# Patient Record
Sex: Female | Born: 1935
Health system: Southern US, Community
[De-identification: ages and names within clinical notes are randomized; demographics above are authoritative.]

## PROBLEM LIST (undated history)

## (undated) DIAGNOSIS — F411 Generalized anxiety disorder: Secondary | ICD-10-CM

## (undated) DIAGNOSIS — Z973 Presence of spectacles and contact lenses: Secondary | ICD-10-CM

## (undated) DIAGNOSIS — H269 Unspecified cataract: Secondary | ICD-10-CM

## (undated) DIAGNOSIS — I5189 Other ill-defined heart diseases: Secondary | ICD-10-CM

## (undated) DIAGNOSIS — I471 Supraventricular tachycardia, unspecified: Secondary | ICD-10-CM

## (undated) DIAGNOSIS — I519 Heart disease, unspecified: Secondary | ICD-10-CM

## (undated) DIAGNOSIS — K219 Gastro-esophageal reflux disease without esophagitis: Secondary | ICD-10-CM

## (undated) DIAGNOSIS — I080 Rheumatic disorders of both mitral and aortic valves: Secondary | ICD-10-CM

## (undated) DIAGNOSIS — M1990 Unspecified osteoarthritis, unspecified site: Secondary | ICD-10-CM

## (undated) DIAGNOSIS — I442 Atrioventricular block, complete: Secondary | ICD-10-CM

## (undated) DIAGNOSIS — K922 Gastrointestinal hemorrhage, unspecified: Secondary | ICD-10-CM

## (undated) DIAGNOSIS — I447 Left bundle-branch block, unspecified: Secondary | ICD-10-CM

## (undated) DIAGNOSIS — C801 Malignant (primary) neoplasm, unspecified: Secondary | ICD-10-CM

## (undated) DIAGNOSIS — I480 Paroxysmal atrial fibrillation: Secondary | ICD-10-CM

## (undated) DIAGNOSIS — Z8711 Personal history of peptic ulcer disease: Secondary | ICD-10-CM

## (undated) HISTORY — DX: Supraventricular tachycardia, unspecified: I47.10

## (undated) HISTORY — PX: CATARACT EXTRACTION: SUR2

## (undated) HISTORY — DX: Generalized anxiety disorder: F41.1

## (undated) HISTORY — PX: ESOPHAGOGASTRODUODENOSCOPY: SHX1529

## (undated) HISTORY — DX: Gastro-esophageal reflux disease without esophagitis: K21.9

## (undated) HISTORY — PX: TUBAL LIGATION: SHX77

## (undated) HISTORY — DX: Other ill-defined heart diseases: I51.89

## (undated) HISTORY — PX: BREAST SURGERY: SHX581

## (undated) HISTORY — PX: TONSILLECTOMY: SUR1361

## (undated) HISTORY — DX: Heart disease, unspecified: I51.9

## (undated) HISTORY — DX: Personal history of peptic ulcer disease: Z87.11

## (undated) HISTORY — PX: ABDOMINAL HYSTERECTOMY: SHX81

## (undated) HISTORY — PX: APPENDECTOMY: SHX54

## (undated) HISTORY — DX: Gastrointestinal hemorrhage, unspecified: K92.2

## (undated) HISTORY — PX: FOOT SURGERY: SHX648

## (undated) HISTORY — DX: Rheumatic disorders of both mitral and aortic valves: I08.0

## (undated) HISTORY — DX: Supraventricular tachycardia: I47.1

---

## 1998-11-09 ENCOUNTER — Inpatient Hospital Stay (HOSPITAL_COMMUNITY): Admission: EM | Admit: 1998-11-09 | Discharge: 1998-11-10 | Payer: Self-pay | Admitting: Emergency Medicine

## 2000-07-05 ENCOUNTER — Encounter: Admission: RE | Admit: 2000-07-05 | Discharge: 2000-07-05 | Payer: Self-pay | Admitting: Geriatric Medicine

## 2000-07-05 ENCOUNTER — Encounter: Payer: Self-pay | Admitting: Geriatric Medicine

## 2000-07-28 ENCOUNTER — Encounter: Payer: Self-pay | Admitting: Geriatric Medicine

## 2000-07-28 ENCOUNTER — Encounter: Admission: RE | Admit: 2000-07-28 | Discharge: 2000-07-28 | Payer: Self-pay | Admitting: Geriatric Medicine

## 2000-07-30 ENCOUNTER — Ambulatory Visit (HOSPITAL_COMMUNITY): Admission: RE | Admit: 2000-07-30 | Discharge: 2000-07-30 | Payer: Self-pay | Admitting: Geriatric Medicine

## 2001-08-10 ENCOUNTER — Encounter: Payer: Self-pay | Admitting: Geriatric Medicine

## 2001-08-10 ENCOUNTER — Encounter: Admission: RE | Admit: 2001-08-10 | Discharge: 2001-08-10 | Payer: Self-pay | Admitting: Geriatric Medicine

## 2001-12-25 ENCOUNTER — Other Ambulatory Visit: Admission: RE | Admit: 2001-12-25 | Discharge: 2001-12-25 | Payer: Self-pay | Admitting: Geriatric Medicine

## 2002-08-23 ENCOUNTER — Encounter: Payer: Self-pay | Admitting: Geriatric Medicine

## 2002-08-23 ENCOUNTER — Encounter: Admission: RE | Admit: 2002-08-23 | Discharge: 2002-08-23 | Payer: Self-pay | Admitting: Geriatric Medicine

## 2002-08-24 ENCOUNTER — Encounter: Payer: Self-pay | Admitting: Geriatric Medicine

## 2002-08-24 ENCOUNTER — Encounter: Admission: RE | Admit: 2002-08-24 | Discharge: 2002-08-24 | Payer: Self-pay | Admitting: Geriatric Medicine

## 2003-09-05 ENCOUNTER — Encounter: Admission: RE | Admit: 2003-09-05 | Discharge: 2003-09-05 | Payer: Self-pay | Admitting: Geriatric Medicine

## 2003-10-14 ENCOUNTER — Encounter: Admission: RE | Admit: 2003-10-14 | Discharge: 2003-10-14 | Payer: Self-pay | Admitting: Geriatric Medicine

## 2004-09-18 ENCOUNTER — Encounter: Admission: RE | Admit: 2004-09-18 | Discharge: 2004-09-18 | Payer: Self-pay | Admitting: Geriatric Medicine

## 2005-01-29 ENCOUNTER — Ambulatory Visit (HOSPITAL_COMMUNITY): Admission: RE | Admit: 2005-01-29 | Discharge: 2005-01-29 | Payer: Self-pay | Admitting: Gastroenterology

## 2005-01-29 ENCOUNTER — Encounter (INDEPENDENT_AMBULATORY_CARE_PROVIDER_SITE_OTHER): Payer: Self-pay | Admitting: *Deleted

## 2005-02-04 ENCOUNTER — Ambulatory Visit: Payer: Self-pay | Admitting: Cardiology

## 2005-02-12 ENCOUNTER — Ambulatory Visit: Payer: Self-pay | Admitting: Cardiology

## 2005-02-19 ENCOUNTER — Ambulatory Visit: Payer: Self-pay | Admitting: Cardiology

## 2005-05-31 ENCOUNTER — Emergency Department (HOSPITAL_COMMUNITY): Admission: EM | Admit: 2005-05-31 | Discharge: 2005-05-31 | Payer: Self-pay | Admitting: Emergency Medicine

## 2005-06-03 ENCOUNTER — Ambulatory Visit: Payer: Self-pay | Admitting: Cardiology

## 2005-06-19 ENCOUNTER — Ambulatory Visit: Payer: Self-pay | Admitting: Cardiology

## 2005-07-02 ENCOUNTER — Ambulatory Visit: Payer: Self-pay

## 2005-10-14 ENCOUNTER — Encounter: Admission: RE | Admit: 2005-10-14 | Discharge: 2005-10-14 | Payer: Self-pay | Admitting: Geriatric Medicine

## 2005-10-25 ENCOUNTER — Ambulatory Visit: Payer: Self-pay | Admitting: Cardiology

## 2006-10-17 ENCOUNTER — Encounter: Admission: RE | Admit: 2006-10-17 | Discharge: 2006-10-17 | Payer: Self-pay | Admitting: Geriatric Medicine

## 2006-10-24 ENCOUNTER — Ambulatory Visit: Payer: Self-pay | Admitting: Cardiology

## 2007-04-28 ENCOUNTER — Encounter (INDEPENDENT_AMBULATORY_CARE_PROVIDER_SITE_OTHER): Payer: Self-pay | Admitting: Orthopedic Surgery

## 2007-04-28 ENCOUNTER — Ambulatory Visit: Payer: Self-pay | Admitting: Vascular Surgery

## 2007-04-28 ENCOUNTER — Ambulatory Visit: Admission: RE | Admit: 2007-04-28 | Discharge: 2007-04-28 | Payer: Self-pay | Admitting: Orthopedic Surgery

## 2007-10-23 ENCOUNTER — Encounter: Admission: RE | Admit: 2007-10-23 | Discharge: 2007-10-23 | Payer: Self-pay | Admitting: Geriatric Medicine

## 2007-10-29 HISTORY — PX: KNEE ARTHROSCOPY: SUR90

## 2008-01-22 ENCOUNTER — Ambulatory Visit: Payer: Self-pay | Admitting: Cardiology

## 2008-05-08 ENCOUNTER — Encounter: Admission: RE | Admit: 2008-05-08 | Discharge: 2008-05-08 | Payer: Self-pay | Admitting: Geriatric Medicine

## 2008-05-24 ENCOUNTER — Encounter: Admission: RE | Admit: 2008-05-24 | Discharge: 2008-07-20 | Payer: Self-pay | Admitting: Geriatric Medicine

## 2008-10-24 ENCOUNTER — Encounter: Admission: RE | Admit: 2008-10-24 | Discharge: 2008-10-24 | Payer: Self-pay | Admitting: Geriatric Medicine

## 2009-02-28 ENCOUNTER — Telehealth: Payer: Self-pay | Admitting: Cardiology

## 2009-03-13 ENCOUNTER — Encounter: Payer: Self-pay | Admitting: Cardiology

## 2009-03-26 DIAGNOSIS — I471 Supraventricular tachycardia, unspecified: Secondary | ICD-10-CM | POA: Insufficient documentation

## 2009-03-28 ENCOUNTER — Ambulatory Visit: Payer: Self-pay | Admitting: Cardiology

## 2009-04-05 ENCOUNTER — Telehealth: Payer: Self-pay | Admitting: Cardiology

## 2009-04-13 DIAGNOSIS — K219 Gastro-esophageal reflux disease without esophagitis: Secondary | ICD-10-CM

## 2009-04-13 DIAGNOSIS — I080 Rheumatic disorders of both mitral and aortic valves: Secondary | ICD-10-CM | POA: Insufficient documentation

## 2009-04-13 DIAGNOSIS — Z8711 Personal history of peptic ulcer disease: Secondary | ICD-10-CM

## 2009-04-13 DIAGNOSIS — R002 Palpitations: Secondary | ICD-10-CM

## 2009-04-13 DIAGNOSIS — I447 Left bundle-branch block, unspecified: Secondary | ICD-10-CM | POA: Insufficient documentation

## 2009-04-19 ENCOUNTER — Ambulatory Visit: Payer: Self-pay | Admitting: Cardiology

## 2009-05-02 ENCOUNTER — Encounter: Payer: Self-pay | Admitting: Cardiology

## 2009-05-02 ENCOUNTER — Ambulatory Visit: Payer: Self-pay

## 2009-05-11 ENCOUNTER — Telehealth: Payer: Self-pay | Admitting: Cardiology

## 2009-10-21 ENCOUNTER — Encounter: Payer: Self-pay | Admitting: Cardiology

## 2009-10-21 ENCOUNTER — Ambulatory Visit: Payer: Self-pay | Admitting: Internal Medicine

## 2009-10-21 ENCOUNTER — Inpatient Hospital Stay (HOSPITAL_COMMUNITY): Admission: EM | Admit: 2009-10-21 | Discharge: 2009-10-22 | Payer: Self-pay | Admitting: Emergency Medicine

## 2009-10-22 ENCOUNTER — Encounter: Payer: Self-pay | Admitting: Cardiology

## 2009-10-24 ENCOUNTER — Telehealth: Payer: Self-pay | Admitting: Cardiology

## 2009-10-30 ENCOUNTER — Telehealth (INDEPENDENT_AMBULATORY_CARE_PROVIDER_SITE_OTHER): Payer: Self-pay | Admitting: *Deleted

## 2009-10-31 ENCOUNTER — Ambulatory Visit: Payer: Self-pay | Admitting: Cardiovascular Disease

## 2009-10-31 ENCOUNTER — Encounter (HOSPITAL_COMMUNITY): Admission: RE | Admit: 2009-10-31 | Discharge: 2010-01-01 | Payer: Self-pay | Admitting: Cardiology

## 2009-10-31 ENCOUNTER — Ambulatory Visit: Payer: Self-pay

## 2009-11-17 ENCOUNTER — Telehealth: Payer: Self-pay | Admitting: Cardiology

## 2009-11-20 ENCOUNTER — Encounter: Payer: Self-pay | Admitting: Cardiology

## 2009-11-22 ENCOUNTER — Ambulatory Visit: Payer: Self-pay | Admitting: Cardiology

## 2009-12-19 ENCOUNTER — Encounter: Admission: RE | Admit: 2009-12-19 | Discharge: 2009-12-19 | Payer: Self-pay | Admitting: Geriatric Medicine

## 2010-01-15 ENCOUNTER — Ambulatory Visit: Payer: Self-pay | Admitting: Internal Medicine

## 2010-04-02 ENCOUNTER — Encounter: Payer: Self-pay | Admitting: Internal Medicine

## 2010-04-02 ENCOUNTER — Telehealth: Payer: Self-pay | Admitting: Internal Medicine

## 2010-04-02 ENCOUNTER — Emergency Department (HOSPITAL_COMMUNITY): Admission: EM | Admit: 2010-04-02 | Discharge: 2010-04-02 | Payer: Self-pay | Admitting: Emergency Medicine

## 2010-04-05 ENCOUNTER — Telehealth: Payer: Self-pay | Admitting: Cardiology

## 2010-06-21 ENCOUNTER — Telehealth: Payer: Self-pay | Admitting: Internal Medicine

## 2010-09-13 ENCOUNTER — Emergency Department (HOSPITAL_COMMUNITY): Admission: EM | Admit: 2010-09-13 | Discharge: 2010-09-13 | Payer: Self-pay | Admitting: Emergency Medicine

## 2010-10-08 ENCOUNTER — Encounter: Payer: Self-pay | Admitting: Internal Medicine

## 2010-10-08 ENCOUNTER — Ambulatory Visit: Payer: Self-pay | Admitting: Internal Medicine

## 2010-10-08 DIAGNOSIS — R300 Dysuria: Secondary | ICD-10-CM | POA: Insufficient documentation

## 2010-10-09 ENCOUNTER — Encounter: Payer: Self-pay | Admitting: Internal Medicine

## 2010-10-11 LAB — CONVERTED CEMR LAB
Bilirubin Urine: NEGATIVE
Blood, UA: NEGATIVE
Casts: NONE SEEN /lpf
Crystals: NONE SEEN
Ketones, ur: NEGATIVE mg/dL
Nitrite: POSITIVE — AB
Protein, ur: NEGATIVE mg/dL
Specific Gravity, Urine: 1.017 (ref 1.005–1.030)
Urine Glucose: NEGATIVE mg/dL
Urobilinogen, UA: 0.2 (ref 0.0–1.0)
pH: 5.5 (ref 5.0–8.0)

## 2010-11-27 ENCOUNTER — Telehealth: Payer: Self-pay | Admitting: Internal Medicine

## 2010-11-27 NOTE — Assessment & Plan Note (Signed)
Summary: post hosp/post myoview   Visit Type:  Follow-up Primary Provider:  [facility]  CC:  feeling sick on stomach.  History of Present Illness: The patient is 77 years old and return for followup management of SVT. She was hospitalized on Christmas day with an episode of SVT which lasted about 6 hours. Apparently this did not convert with adenosine but did convert with IV diltiazem. She was discharged on diltiazem 120 mg daily. She has had no recurrence of her arrhythmia. From the discharge summary was not clear if the rhythm was atrial flutter or reentrant atrial tachycardia.  She has had no recurrences of her arrhythmia. She does say she stands having some problems with the Cardizem and that she is having difficulty sleeping.  When she was in the hospital her troponins were positive and she was scheduled for a Myoview scan here which showed no evidence of ischemia and showed an ejection fraction of 55-60%.  She has left bundle branch block and in the past as had slight depression of LV function with ejection fraction in the range of 45-50. Her last echocardiogram in July 2000 and showed an ejection fraction of 50-55% with basal septal hypertrophy and diastolic dysfunction.  Current Medications (verified): 1)  Estradiol 1mg  .... 1/2 Tab Daily 2)  Clorazepate Dipotassium 7.5 Mg Tabs (Clorazepate Dipotassium) .... Take As Directed As Needed 3)  Prilosec .Marland Kitchen.. 1 Tab Once Daily 4)  Multivitamins   Tabs (Multiple Vitamin) .Marland Kitchen.. 1 Once Daily 5)  Cal-Mag-Zinc .... 2 Tabs Once Daily 6)  Dilt-Cd 120 Mg Xr24h-Cap (Diltiazem Hcl Coated Beads) .... One Tab By Mouth Once Daily 7)  Zyrtec Allergy 10 Mg Caps (Cetirizine Hcl) .Marland Kitchen.. 1 Tab Once Daily  Allergies (verified): 1)  ! * Asa 325 Mg  Past History:  Past Medical History: Reviewed history from 04/13/2009 and no changes required. GI BLEEDING, HX OF (ICD-578.9) * HX OF MILD LEFT VENTRICULAR SYSTOLIC DYSFUNCTION ANXIETY, HX OF  (ICD-300.00) PEPTIC ULCER DISEASE, HX OF (ICD-V12.71) GERD (ICD-530.81) MITRAL REGURGITATION (ICD-396.3) PALPITATIONS, HX OF (ICD-V12.50) LEFT BUNDLE BRANCH BLOCK (ICD-426.3) CARDIOMYOPATHY, PRIMARY, DILATED (ICD-425.4) SVT/ PSVT/ PAT (ICD-427.0)    Review of Systems       ROS is negative except as outlined in HPI.   Vital Signs:  Patient profile:   77 year old female Height:      67 inches Weight:      150 pounds BP sitting:   133 / 75  (left arm) Cuff size:   regular  Vitals Entered By: Burnett Kanaris, CNA (November 22, 2009 12:08 PM)  Physical Exam  Additional Exam:  Gen. Well-nourished, in no distress   Neck: No JVD, thyroid not enlarged, no carotid bruits Lungs: No tachypnea, clear without rales, rhonchi or wheezes Cardiovascular: Rhythm regular, PMI not displaced,  heart sounds  normal, no murmurs or gallops, no peripheral edema, pulses normal in all 4 extremities. Abdomen: BS normal, abdomen soft and non-tender without masses or organomegaly, no hepatosplenomegaly. MS: No deformities, no cyanosis or clubbing   Neuro:  No focal sns   Skin:  no lesions    Impression & Recommendations:  Problem # 1:  SVT/ PSVT/ PAT (ICD-427.0) She has had symptomatic SVT requiring hospitalization. She feels she is partially intolerant to Cardizem. She has seen Dr. Graciela Husbands in the past and we'll plan do arrange an EP referral to be addressed the possibility of ablation. We'll try and get the ECGs from the hospital so we'll have those prior to the visit. Orders:  EKG w/ Interpretation (93000) EP Referral (Cardiology EP Ref )  Problem # 2:  CARDIOMYOPATHY, PRIMARY, DILATED (ICD-425.4) She has left bundle branch block and has had borderline LV dysfunction by echocardiography. She also has basal septal hypertrophy and diastolic LV dysfunction. This problem appears stable and don't think we require any further changes in treatment now. The following medications were removed from the medication  list:    Atenolol 25 Mg Tabs (Atenolol) .Marland Kitchen... Take one tablet by mouth daily Her updated medication list for this problem includes:    Dilt-cd 120 Mg Xr24h-cap (Diltiazem hcl coated beads) ..... One tab by mouth once daily  Patient Instructions: 1)  Your physician wants you to follow-up in: 6 months. You will receive a reminder letter in the mail two months in advance. If you don't receive a letter, please call our office to schedule the follow-up appointment. 2)  We will refer you to Dr. Graciela Husbands for possible SVT ablation. - in 1 month.

## 2010-11-27 NOTE — Miscellaneous (Signed)
  Clinical Lists Changes  Observations: Added new observation of NUCLEAR NOS:  Overall Impression   Exercise Capacity: Adenosine study with no exercise. BP Response: Normal blood pressure response. Clinical Symptoms: Neck Tightness ECG Impression: LBBB Overall Impression: Normal stress nuclear study. Overall Impression Comments: Normal  (10/31/2009 16:00)      Nuclear Study  Procedure date:  10/31/2009  Findings:       Overall Impression   Exercise Capacity: Adenosine study with no exercise. BP Response: Normal blood pressure response. Clinical Symptoms: Neck Tightness ECG Impression: LBBB Overall Impression: Normal stress nuclear study. Overall Impression Comments: Normal

## 2010-11-27 NOTE — Progress Notes (Signed)
Summary: Nuclear pre-Procedure  Phone Note Outgoing Call   Call placed by: Milana Na, EMT-P,  October 30, 2009 3:43 PM Summary of Call: Reviewed information on Myoview Information Sheet (see scanned document for further details).  Spoke with patient.     Nuclear Med Background Indications for Stress Test: Evaluation for Ischemia, Post Hospital  Indications Comments: 10/22/09 DC'd  was admitted for SVT, felt to be AVNRT  History: Echo, Myocardial Perfusion Study  History Comments: '06 MPS poss. small inferoseptal infarct EF 56% ECHO EF 50-55% mild AR  Diastolic CHF H/O SVT  Symptoms: Chest Pain, Palpitations    Nuclear Pre-Procedure Height (in): 67  Nuclear Med Study Referring MD:  B.Juanda Chance

## 2010-11-27 NOTE — Assessment & Plan Note (Signed)
Summary: Cardiology Nuclear Study  Nuclear Med Background Indications for Stress Test: Evaluation for Ischemia, Post Hospital  Indications Comments: 10/21/09 admitted for SVT/atrial flutter, felt to be AVNRT; mildly elevated cardiac enzymes  History: Echo, Myocardial Perfusion Study  History Comments: '06 [id number] small infero-septal infarct, EF=56%; 7/10 ECHO:EF=50-55%,  mild AR.  h/o diastolic CHF, SVT  Symptoms: Chest Pain, Palpitations, Rapid HR, SOB  Symptoms Comments: Last episode of JW:[id number] night; also c/o (L) arm aching.   Nuclear Pre-Procedure Cardiac Risk Factors: LBBB Caffeine/Decaff Intake: None NPO After: 6:00 PM Lungs: Clear IV 0.9% NS with Angio Cath: 20g     IV Site: (R) AC IV Started by: Stanton Kidney EMT-P Chest Size (in) 34     Cup Size B     Height (in): 67 Weight (lb): 147 BMI: 23.11  Nuclear Med Study 1 or 2 day study:  1 day     Stress Test Type:  Adenosine Reading MD:  Charlton Haws, MD     Referring MD:  Charlies Constable, MD Resting Radionuclide:  Technetium 20m Tetrofosmin     Resting Radionuclide Dose:  10.8 mCi  Stress Radionuclide:  Technetium 56m Tetrofosmin     Stress Radionuclide Dose:  33.0 mCi   Stress Protocol  Dose of Adenosine:  37.4 mg    Stress Test Technologist:  [employer]a [employer] CMA-N     Nuclear Technologist:  Burna Mortimer Deal RT-N  Rest Procedure  Myocardial perfusion imaging was performed at rest 45 minutes following the intravenous administration of Myoview Technetium 25m Tetrofosmin.  Stress Procedure  The patient received IV adenosine at 140 mcg/kg/min for 4 minutes. There was an episode of 2nd degree AVB with infusion.  She did c/o chest tightness with infusion.  Isolated atrial triplet in recovery.  Myoview was injected at the 2 minute mark and quantitative spect images were obtained after a 45 minute delay.  QPS Raw Data Images:  Normal; no motion artifact; normal heart/lung ratio. Stress Images:  NI: Uniform and normal uptake of  tracer in all myocardial segments. Rest Images:  Normal homogeneous uptake in all areas of the myocardium. Subtraction (SDS):  Normal Transient Ischemic Dilatation:  1.08  (Normal <1.22)  Lung/Heart Ratio:  .27  (Normal <0.45)  Quantitative Gated Spect Images QGS EDV:  84 ml QGS ESV:  29 ml QGS EF:  66 % QGS cine images:  normal  Findings Normal nuclear study      Overall Impression  Exercise Capacity: Adenosine study with no exercise. BP Response: Normal blood pressure response. Clinical Symptoms: Neck Tightness ECG Impression: LBBB Overall Impression: Normal stress nuclear study. Overall Impression Comments: Normal

## 2010-11-27 NOTE — Progress Notes (Signed)
Summary: pt has racing heart  Phone Note Call from Patient Call back at Home Phone [phone]   Caller: Spouse Reason for Call: Talk to Nurse, Talk to Doctor Summary of Call: pt is weak, SOB and racing heart and skipping beats (he hung up before I could ask anymore question) Initial call taken by: Omer Jack,  April 02, 2010 12:36 PM  Follow-up for Phone Call        since about 10:30 this am she has had a rapid heart beat about 140s she feels SOB and weak she is agreeable to go to ER Trish notified, pts husband will drive her Meredith Staggers, RN  April 02, 2010 12:53 PM

## 2010-11-27 NOTE — Progress Notes (Signed)
Summary: EP appt  (spoke with dtr 04/12/10   js)  Phone Note Outgoing Call   Call placed by: Sherri Rad, RN, BSN,  April 05, 2010 4:29 PM Call placed to: Patient Summary of Call: The pt was recently seen in the ER for SVT. Per Dr. Juanda Chance, she needs to f/u with Dr. Graciela Husbands or Dr. Johney Frame. The pt has seen Dr. Graciela Husbands, but per Dr. Juanda Chance, she has stated she may want to see Dr. Johney Frame. I have left a message for her to call. Initial call taken by: Sherri Rad, RN, BSN,  April 05, 2010 4:30 PM  Follow-up for Phone Call        Spoke with the pt's dtr.  She stated that her father was in the hospital last week so her mother (the pt) is behind on following up on some things.   I told her that when she calls, she can tell the people who answer that she wants to get an appt with Dr. Johney Frame per Dr. Juanda Chance and that they should be able to schedule an appt for her. The dtr verbalized understanding. Follow-up by: Minerva Areola, RN, BSN,  April 12, 2010 11:46 AM

## 2010-11-27 NOTE — Letter (Signed)
Summary: ER Notification  Architectural technologist, Main Office  [address]   [place], [place] [address]   Phone: [phone]  Fax: [phone]    April 02, 2010 12:53 PM  [facility]  The above referenced patient has been advised to report directly to the Emergency Room. Please see below for more information:  Dx:  SVT     Private Vehicle  ________X_______ or EMS:  ________________   Orders:  Yes ______ or No  ___X____   Notify upon arrival:    Trish [phone]       Or _________________   Thank you,    [facility] Staff

## 2010-11-27 NOTE — Progress Notes (Signed)
Summary: talk to you about meds  Phone Note Call from Patient Call back at Home Phone [phone]   Caller: Patient Reason for Call: Talk to Nurse, Talk to Doctor Summary of Call: pt wants to talk to you about her meds Initial call taken by: Omer Jack,  November 17, 2009 1:51 PM  Follow-up for Phone Call        I spoke with the pt. She was started on Cardizem CD 120mg  once daily when being d/c'ed from the hospital around 10/22/09. The pt states this was called to [facility], b/c she was d/c'ed on the weekend and she usually doesn't use them. Her regular pharmacy is [facility]. The pt request a refill be sent to [facility] today in case we have bad weather next week and she is unable to come for her office visit. The pt does also state she has been unable to sleep well since being d/c'ed home. She questions if this is linked with her Diltiazem. I explained we will address this at her office visit. The pt is agreeable. Follow-up by: Sherri Rad, RN, BSN,  November 17, 2009 2:00 PM    New/Updated Medications: DILT-CD 120 MG XR24H-CAP (DILTIAZEM HCL COATED BEADS) one tab by mouth once daily Prescriptions: DILT-CD 120 MG XR24H-CAP (DILTIAZEM HCL COATED BEADS) one tab by mouth once daily  #30 x 6   Entered by:   Sherri Rad, RN, BSN   Authorized by:   Lenoria Farrier, MD, [facility]   Signed by:   Sherri Rad, RN, BSN on 11/17/2009   Method used:   Electronically to        [facility]* (retail)       [address] [place], [place]  [address]       Ph: [phone] or [phone]       Fax: [phone]   RxID:   [id number]

## 2010-11-27 NOTE — Progress Notes (Signed)
Summary: refill  Phone Note Refill Request Message from:  Patient on June 21, 2010 9:29 AM  Refills Requested: Medication #1:  DILT-CD 120 MG XR24H-CAP one tab by mouth once daily [facility] [phone] PT one have 1 pill need filled today  Initial call taken by: Judie Grieve,  June 21, 2010 9:30 AM  Follow-up for Phone Call       Follow-up by: Judithe Modest CMA,  June 21, 2010 11:38 AM    Prescriptions: DILT-CD 120 MG XR24H-CAP (DILTIAZEM HCL COATED BEADS) one tab by mouth once daily  #30 x 6   Entered by:   Judithe Modest CMA   Authorized by:   Nathen May, MD, [facility]   Signed by:   Judithe Modest CMA on 06/21/2010   Method used:   Electronically to        [facility] (retail)       [address] [place], [place]  [address]       Ph: [phone] or [phone]       Fax: [phone]   RxID:   [id number]

## 2010-11-27 NOTE — Assessment & Plan Note (Signed)
Summary: ep eval svt last seen by [facility] 2003   Visit Type:  Initial Consult Primary Provider:  [facility]  CC:  New EP evaluation SVT.  History of Present Illness: Mrs Yvonne [name] is 77 years old and is seen at the request of Dr. Juanda Chance to consider further management options for her SVT.   I saw her in 2000.  At that time she had a 5-6 year history of recurrent abrupt onset offset tachypalpitations which SVT had been documented. They were frog positive and diuretic negative.  In 2006 she had a prolonged episode lasted 2 or 3 days. It was terminated with adenosine.  She was hospitalized on Christmas day with an episode of SVT which lasted about 6 hours. Apparently this did not convert with adenosine but did convert with IV diltiazem. She was discharged on diltiazem 120 mg daily. She has had no recurrence of her arrhythmia.   We have reviewedECGs from that visit w svt is identified at a cycle length of approximately 420 ms  she is intercurrently undergone Myoview scan that demonstrated no ischemia and normal left ventricular function   The patient denies SOB, chest pain,or edema    Current Medications (verified): 1)  Estradiol 1 Mg Tabs (Estradiol) .... 1/2 Tablet Daily 2)  Clorazepate Dipotassium 7.5 Mg Tabs (Clorazepate Dipotassium) .... Take As Directed As Needed 3)  Prilosec Otc 20 Mg Tbec (Omeprazole Magnesium) .... Take 1 Tablet By Mouth Once A Day 4)  Multivitamins   Tabs (Multiple Vitamin) .Marland Kitchen.. 1 Once Daily 5)  Calcium-Magnesium-Zinc 333-133-8.3 Mg Tabs (Calcium-Magnesium-Zinc) .... Take 1 Tablet By Mouth Two Times A Day 6)  Dilt-Cd 120 Mg Xr24h-Cap (Diltiazem Hcl Coated Beads) .... One Tab By Mouth Once Daily 7)  Zyrtec Allergy 10 Mg Caps (Cetirizine Hcl) .Marland Kitchen.. 1 Tab Once Daily 8)  Tylenol Extra Strength 500 Mg Tabs (Acetaminophen) .... As Needed  Allergies: 1)  ! * Asa 325 Mg  Past History:  Past Medical History: Last updated: 04/13/2009 GI BLEEDING, HX OF (ICD-578.9) * HX  OF MILD LEFT VENTRICULAR SYSTOLIC DYSFUNCTION ANXIETY, HX OF (ICD-300.00) PEPTIC ULCER DISEASE, HX OF (ICD-V12.71) GERD (ICD-530.81) MITRAL REGURGITATION (ICD-396.3) PALPITATIONS, HX OF (ICD-V12.50) LEFT BUNDLE BRANCH BLOCK (ICD-426.3) CARDIOMYOPATHY, PRIMARY, DILATED (ICD-425.4) SVT/ PSVT/ PAT (ICD-427.0)    Vital Signs:  Patient profile:   77 year old female Height:      67 inches Weight:      149.75 pounds BMI:     23.54 Pulse rate:   81 / minute Pulse rhythm:   regular Resp:     18 per minute BP sitting:   128 / 71  (left arm) Cuff size:   large  Vitals Entered By: Vikki Ports (January 15, 2010 2:41 PM)  Physical Exam  General:  Alert and oriented   Caucasian female appearing considerably younger than her stated age of 23in no acute distress. HEENT  normal . Neck veins were flat; carotids brisk and full without bruits. No lymphadenopathy. Back without kyphosis. Lungs clear. Heart sounds regular without murmurs or gallops. PMI nondisplaced. Abdomen soft with active bowel sounds without midline pulsation or hepatomegaly. Femoral pulses and distal pulses intact. Extremities were without clubbing cyanosis or edemaSkin warm and dry. Neurological exam grossly normal; affect is engage    Impression & Recommendations:  Problem # 1:  SVT/ PSVT/ PAT (ICD-427.0) the patient has a current but infrequent episodes of SVT. They're likely AV node reentry based on her symptoms. Her concern was generated by comments made in the  emergency room that "she could die". I have assured her that SVT is almost never a life-threatening illness.  Furthermore I suggested that taking daily medication to prevent spells that occur every 4 years maybe more than she needs to do.  We have reviewed treatment options including one stopping her medication and treating it as she has in the past with intermittent visits to the emergency room, 2 taking daily or p.r.n. calcium blockers, and 3) undertaking catheter  ablation. We discussed potential benefits as well as risks including but not limited to death perforation and heart block requiring pacemaker implantation. We have given her information regarding the procedure and a tachycardia from the [employer]. She will contact us and let us know what it is that she would like to do. Her updated medication list for this problem includes:    Dilt-cd 120 Mg Xr24h-cap (Diltiazem hcl coated beads) ..... One tab by mouth once daily  Problem # 2:  LEFT BUNDLE BRANCH BLOCK (ICD-426.3) stable  Problem # 3:  * HX OF MILD LEFT VENTRICULAR SYSTOLIC DYSFUNCTION Will defer to Dr. Juanda Chance as to whether he takes low dose ACE inhibitors may be of any benefit. In the event that long-term medications chosen for her SVT I would probably prefer the use of a beta blocker

## 2010-11-29 NOTE — Assessment & Plan Note (Signed)
Summary: ec6   Visit Type:  Follow-up Primary Provider:  Stoneking   History of Present Illness: Yvonne [name] presents today for EP follow-up.  She has previously been followed by Dr Graciela Husbands for recurrent SVT.  She has opted for medical therapy with cardizem.  Since last being seen by Dr Graciela Husbands, she has had two episodes of SVT.  These both occured without obvious trigger.  She denies abrupt onset and termination of tachypalpitations with associated nervousness and fatigue.  She states that the first episode occured in June.  Upon presentation to [facility], she returned to sinus rhythm.  She reports that the most recent episode occured several weeks ago.  Again, by the time that she presented, she had returned to sinus rhythm.  I have reviewed Dr Odessa Fleming note which revealed that her SVT began 11s.  She has had SVT previously documented.  In 2006 she had a prolonged episode lasted 2 or 3 days. It was terminated with adenosine.  She was hospitalized on 09/2009 with an episode of SVT which lasted about 6 hours. Apparently this did not convert with adenosine but did convert with IV diltiazem. She was discharged on diltiazem 120 mg daily.   The patient denies SOB, chest pain,or edema    Current Medications (verified): 1)  Estradiol 1 Mg Tabs (Estradiol) .... 1/2 Tablet Daily 2)  Clorazepate Dipotassium 7.5 Mg Tabs (Clorazepate Dipotassium) .... Take As Directed As Needed 3)  Prilosec Otc 20 Mg Tbec (Omeprazole Magnesium) .... Take 1 Tablet By Mouth Once A Day 4)  Multivitamins   Tabs (Multiple Vitamin) .Marland Kitchen.. 1 Once Daily 5)  Calcium-Magnesium-Zinc 333-133-8.3 Mg Tabs (Calcium-Magnesium-Zinc) .... Take 1 Tablet By Mouth Two Times A Day 6)  Dilt-Cd 120 Mg Xr24h-Cap (Diltiazem Hcl Coated Beads) .... One Tab By Mouth Once Daily 7)  Zyrtec Allergy 10 Mg Caps (Cetirizine Hcl) .Marland Kitchen.. 1 Tab Once Daily 8)  Tylenol Extra Strength 500 Mg Tabs (Acetaminophen) .... As Needed  Allergies: 1)  ! * Asa 325 Mg  Past  History:  Past Medical History: Reviewed history from 04/13/2009 and no changes required. GI BLEEDING, HX OF (ICD-578.9) * HX OF MILD LEFT VENTRICULAR SYSTOLIC DYSFUNCTION ANXIETY, HX OF (ICD-300.00) PEPTIC ULCER DISEASE, HX OF (ICD-V12.71) GERD (ICD-530.81) MITRAL REGURGITATION (ICD-396.3) PALPITATIONS, HX OF (ICD-V12.50) LEFT BUNDLE BRANCH BLOCK (ICD-426.3) CARDIOMYOPATHY, PRIMARY, DILATED (ICD-425.4) SVT/ PSVT/ PAT (ICD-427.0)    Past Surgical History: Reviewed history from 03/25/2009 and no changes required. EGD and colonoscopy.  Social History: :  The patient lives in [place] with her husband.  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  She exercises on a  treadmill three times a week.  Review of Systems       All systems are reviewed and negative except as listed in the HPI.   Vital Signs:  Patient profile:   77 year old female Height:      67 inches Weight:      148 pounds BMI:     23.26 Pulse rate:   66 / minute BP sitting:   142 / 80  (left arm)  Vitals Entered By: Laurance Flatten CMA (October 08, 2010 10:48 AM)  Physical Exam  General:  Well developed, well nourished, in no acute distress. Head:  normocephalic and atraumatic Mouth:  Teeth, gums and palate normal. Oral mucosa normal. Neck:  supple Lungs:  Clear bilaterally to auscultation and percussion. Heart:  Non-displaced PMI, chest non-tender; regular rate and rhythm, S1, S2 without murmurs,  rubs or gallops. Carotid upstroke normal, no bruit. Normal abdominal aortic size, no bruits. Femorals normal pulses, no bruits. Pedals normal pulses. No edema, no varicosities. Abdomen:  Bowel sounds positive; abdomen soft and non-tender without masses, organomegaly, or hernias noted. No hepatosplenomegaly. Msk:  Back normal, normal gait. Muscle strength and tone normal. Extremities:  No clubbing or cyanosis. Neurologic:  Alert and oriented x 3. Skin:  Intact without lesions or rashes. Psych:   Normal affect.    Nuclear Study  Procedure date:  10/31/2009  Findings:      QPS  Raw Data Images:  Normal; no motion artifact; normal heart/lung ratio. Stress Images:  NI: Uniform and normal uptake of tracer in all myocardial segments. Rest Images:  Normal homogeneous uptake in all areas of the myocardium. Subtraction (SDS):  Normal Transient Ischemic Dilatation:  1.08  (Normal <1.22)  Lung/Heart Ratio:  .27  (Normal <0.45)  Quantitative Gated Spect Images  QGS EDV:  84 ml QGS ESV:  29 ml QGS EF:  66 % QGS cine images:  normal  Findings  Normal nuclear study    EKG  Procedure date:  10/08/2010  Findings:      sinus rhythm 66 bpm, PR 168, QRS 152, LBBB  Impression & Recommendations:  Problem # 1:  SVT/ PSVT/ PAT (ICD-427.0) The patient presents today for follow-up of SVT.  She has had 2 episodes of SVT since her last visit to our clinic. Therapeutic strategies for SVT including medicine and ablation were discussed in detail with the patient today. Risk, benefits, and alternatives to EP study and radiofrequency ablation were also discussed in detail today.  At this time, the patient is clear that she continues to wish to avoid ablation.  I have therefore increased cardizem to 240mg  daily today. I have discussed the role of vagal maneuvers to terminate SVT. She will contact my office if she wishes to proceed with catheter ablation.  Problem # 2:  LEFT BUNDLE BRANCH BLOCK (ICD-426.3) asymptomatic  Other Orders: T-Urinalysis ([id number]) T-Culture, Urine ([id number])  Patient Instructions: 1)    2)  Your physician has recommended you make the following change in your medication: increase Diltiazem to 240mg  daily( take 2 of the 120mg  daily until you run out) then pick up new  3)  Your physician wants you to follow-up in: 12 months with Dr Jacquiline Doe will receive a reminder letter in the mail two months in advance. If you don't receive a letter, please call our  office to schedule the follow-up appointment. Prescriptions: DILTIAZEM HCL CR 240 MG XR24H-CAP (DILTIAZEM HCL) one by mouth daily  #30 x 11   Entered by:   Dennis Bast, RN, BSN   Authorized by:   Hillis Range, MD   Signed by:   Dennis Bast, RN, BSN on 10/08/2010   Method used:   Electronically to        [facility]* (retail)       [address] [place], [place]  [address]       Ph: [phone] or [phone]       Fax: [phone]   RxID:   [id number]   Appended Document: ec6 In addition, on ROS today, she reports having dysuria for several days. She has a h/o recurrent UTIs. We will obtain UA with C&S today. She is instructed to follow-up with PCP if symptoms do not resolve.  We will reserve abx pending results of UA.

## 2010-12-05 NOTE — Progress Notes (Signed)
Summary: Pt request call  Phone Note Call from Patient Call back at Home Phone [phone]   Caller: Patient Reason for Call: Talk to Nurse Initial call taken by: Judie Grieve,  November 27, 2010 2:24 PM  Follow-up for Phone Call        having a problem with constipation since being on increased dose of Diltiatem.  needs to know what to do  I told her I would discuss with Dr Johney Frame when he is in office this week.  She has tried Colace. Dennis Bast, RN, BSN  November 27, 2010 5:34 PM Follow-up by: Hillis Range, MD,  November 28, 2010 4:46 PM  Additional Follow-up for Phone Call Additional follow up Details #1::        Clarification:  my note and the med order suggest that she is on diltiazem.  My note suggests that she was initially on diltiazem 120mg  daily and I increased it to 240mg  daily.  IF this is the case, return her to her prior dose of 120mg  daily. Additional Follow-up by: Hillis Range, MD,  November 28, 2010 4:47 PM    Additional Follow-up for Phone Call Additional follow up Details #2::    Spoke with pt and she will go back down to 120mg  of Diltiazem daily  I have called in new rx Dennis Bast, RN, BSN  November 28, 2010 5:54 PM  New/Updated Medications: DILTIAZEM HCL 120 MG TABS (DILTIAZEM HCL) one by mouth daily  (can't tolerate 240mg ) Prescriptions: DILTIAZEM HCL 120 MG TABS (DILTIAZEM HCL) one by mouth daily  (can't tolerate 240mg )  #30 x 11   Entered by:   Dennis Bast, RN, BSN   Authorized by:   Hillis Range, MD   Signed by:   Dennis Bast, RN, BSN on 11/28/2010   Method used:   Electronically to        [facility]* (retail)       [address] [place], [place]  [address]       Ph: [phone] or [phone]       Fax: [phone]   RxID:   [id number]

## 2010-12-20 ENCOUNTER — Other Ambulatory Visit: Payer: Self-pay | Admitting: Geriatric Medicine

## 2010-12-20 DIAGNOSIS — Z1231 Encounter for screening mammogram for malignant neoplasm of breast: Secondary | ICD-10-CM

## 2011-01-07 ENCOUNTER — Ambulatory Visit
Admission: RE | Admit: 2011-01-07 | Discharge: 2011-01-07 | Disposition: A | Discharge status: Home / Self Care | Payer: Medicare Other | Source: Ambulatory Visit | Attending: Geriatric Medicine | Admitting: Geriatric Medicine

## 2011-01-07 DIAGNOSIS — Z1231 Encounter for screening mammogram for malignant neoplasm of breast: Secondary | ICD-10-CM

## 2011-01-08 LAB — DIFFERENTIAL
Basophils Absolute: 0 10*3/uL (ref 0.0–0.1)
Basophils Relative: 1 % (ref 0–1)
Eosinophils Absolute: 0 10*3/uL (ref 0.0–0.7)
Eosinophils Relative: 0 % (ref 0–5)
Lymphocytes Relative: 23 % (ref 12–46)
Lymphs Abs: 1.8 10*3/uL (ref 0.7–4.0)
Monocytes Absolute: 0.7 10*3/uL (ref 0.1–1.0)
Monocytes Relative: 9 % (ref 3–12)
Neutro Abs: 5.2 10*3/uL (ref 1.7–7.7)
Neutrophils Relative %: 67 % (ref 43–77)

## 2011-01-08 LAB — URINE CULTURE
Colony Count: >=100000
Culture  Setup Time: 201111171757

## 2011-01-08 LAB — CBC
HCT: 35.6 % — ABNORMAL LOW (ref 36.0–46.0)
Hemoglobin: 12.1 g/dL (ref 12.0–15.0)
MCH: 30.7 pg (ref 26.0–34.0)
MCHC: 34 g/dL (ref 30.0–36.0)
MCV: 90.4 fL (ref 78.0–100.0)
Platelets: 279 10*3/uL (ref 150–400)
RBC: 3.94 MIL/uL (ref 3.87–5.11)
RDW: 13.9 % (ref 11.5–15.5)
WBC: 7.8 10*3/uL (ref 4.0–10.5)

## 2011-01-08 LAB — URINALYSIS, ROUTINE W REFLEX MICROSCOPIC
Bilirubin Urine: NEGATIVE
Glucose, UA: NEGATIVE mg/dL
Hgb urine dipstick: NEGATIVE
Ketones, ur: NEGATIVE mg/dL
Nitrite: POSITIVE — AB
Protein, ur: NEGATIVE mg/dL
Specific Gravity, Urine: 1.006 (ref 1.005–1.030)
Urobilinogen, UA: 0.2 mg/dL (ref 0.0–1.0)
pH: 6 (ref 5.0–8.0)

## 2011-01-08 LAB — BASIC METABOLIC PANEL
BUN: 19 mg/dL (ref 6–23)
CO2: 29 mEq/L (ref 19–32)
Calcium: 9.1 mg/dL (ref 8.4–10.5)
Chloride: 105 mEq/L (ref 96–112)
Creatinine, Ser: 0.82 mg/dL (ref 0.4–1.2)
GFR calc Af Amer: >60 mL/min (ref >60)
GFR calc non Af Amer: >60 mL/min (ref >60)
Glucose, Bld: 97 mg/dL (ref 70–99)
Potassium: 4.1 mEq/L (ref 3.5–5.1)
Sodium: 139 mEq/L (ref 135–145)

## 2011-01-08 LAB — POCT CARDIAC MARKERS
CKMB, poc: 1.5 ng/mL (ref 1.0–8.0)
CKMB, poc: 1.8 ng/mL (ref 1.0–8.0)
Myoglobin, poc: 90.4 ng/mL (ref 12–200)
Myoglobin, poc: 97.8 ng/mL (ref 12–200)
Troponin i, poc: <0.05 ng/mL (ref 0.00–0.09)
Troponin i, poc: <0.05 ng/mL (ref 0.00–0.09)

## 2011-01-08 LAB — URINE MICROSCOPIC-ADD ON

## 2011-01-28 LAB — CBC
HCT: 39.5 % (ref 36.0–46.0)
Hemoglobin: 13.4 g/dL (ref 12.0–15.0)
MCHC: 33.9 g/dL (ref 30.0–36.0)
MCV: 93.8 fL (ref 78.0–100.0)
Platelets: 323 10*3/uL (ref 150–400)
RBC: 4.21 MIL/uL (ref 3.87–5.11)
RDW: 13.3 % (ref 11.5–15.5)
WBC: 11.3 10*3/uL — ABNORMAL HIGH (ref 4.0–10.5)

## 2011-01-28 LAB — CARDIAC PANEL(CRET KIN+CKTOT+MB+TROPI)
Relative Index: INVALID (ref 0.0–2.5)
Total CK: 48 U/L (ref 7–177)

## 2011-01-28 LAB — URINALYSIS, MICROSCOPIC ONLY
Bilirubin Urine: NEGATIVE
Glucose, UA: NEGATIVE mg/dL
Hgb urine dipstick: NEGATIVE
Ketones, ur: NEGATIVE mg/dL
Nitrite: POSITIVE — AB
Protein, ur: NEGATIVE mg/dL
Specific Gravity, Urine: 1.023 (ref 1.005–1.030)
Urobilinogen, UA: 0.2 mg/dL (ref 0.0–1.0)
pH: 6 (ref 5.0–8.0)

## 2011-01-28 LAB — DIFFERENTIAL
Basophils Absolute: 0 10*3/uL (ref 0.0–0.1)
Eosinophils Relative: 1 % (ref 0–5)
Lymphocytes Relative: 19 % (ref 12–46)
Lymphs Abs: 2.1 10*3/uL (ref 0.7–4.0)
Monocytes Absolute: 0.5 10*3/uL (ref 0.1–1.0)
Monocytes Relative: 4 % (ref 3–12)
Neutro Abs: 8.6 10*3/uL — ABNORMAL HIGH (ref 1.7–7.7)

## 2011-01-28 LAB — COMPREHENSIVE METABOLIC PANEL
AST: 33 U/L (ref 0–37)
CO2: 23 mEq/L (ref 19–32)
Calcium: 8.5 mg/dL (ref 8.4–10.5)
Creatinine, Ser: 0.77 mg/dL (ref 0.4–1.2)
GFR calc Af Amer: >60 mL/min (ref >60)
GFR calc non Af Amer: >60 mL/min (ref >60)
Glucose, Bld: 119 mg/dL — ABNORMAL HIGH (ref 70–99)
Total Protein: 6.2 g/dL (ref 6.0–8.3)

## 2011-01-28 LAB — POCT I-STAT, CHEM 8
BUN: 25 mg/dL — ABNORMAL HIGH (ref 6–23)
Calcium, Ion: 1.14 mmol/L (ref 1.12–1.32)
Chloride: 110 mEq/L (ref 96–112)
Creatinine, Ser: 1 mg/dL (ref 0.4–1.2)

## 2011-01-28 LAB — POCT CARDIAC MARKERS: Troponin i, poc: <0.05 ng/mL (ref 0.00–0.09)

## 2011-01-28 LAB — CK TOTAL AND CKMB (NOT AT ARMC)
Relative Index: INVALID (ref 0.0–2.5)
Total CK: 53 U/L (ref 7–177)

## 2011-01-28 LAB — TROPONIN I: Troponin I: 0.08 ng/mL — ABNORMAL HIGH (ref 0.00–0.06)

## 2011-03-12 NOTE — Assessment & Plan Note (Signed)
[facility]                            CARDIOLOGY OFFICE NOTE   NAME:[name], [name]                         MRN:          [id number]  DATE:01/22/2008                            DOB:          [birth year]    PRIMARY CARE PHYSICIAN:  Hal T. Stoneking, M.D.   CLINICAL HISTORY:  Yvonne [name] is 77 years old and returns for followup  management of her cardiac disease.  She has a history of SVT which is  being managed with beta-blockers.  She also has left bundle branch block  and has diastolic and mild systolic dysfunction by echocardiography.   She says she has been doing well.  She has occasional palpitations.  She  denies any swelling or shortness of breath.  She has had no chest pain.   PAST MEDICAL HISTORY:  Significant for previous GI bleed.   CURRENT MEDICATIONS:  1. Include Estrace.  2. Metoprolol 25 mg 1/2 tablet b.i.d. with previous change from the      long-acting metoprolol because of availability.  3. She is also on Tranxene.  4. Prilosec.   PHYSICAL EXAMINATION:  VITAL SIGNS:  On examination the blood pressure  is 108/68 and the pulse 67 and regular.  NECK:  There was no venous distention.  The carotid pulses were full  without bruits.  CHEST:  Was clear.  HEART:  Rhythm regular.  No murmurs or gallops.  ABDOMEN:  Soft without organomegaly.  EXTREMITIES:  Peripheral pulses were full with no peripheral edema.   An electrocardiogram showed left bundle branch block.   IMPRESSION:  1. Supraventricular tachycardia controlled on beta-blockers.  2. Left bundle branch block.  3. Mild LV (left ventricle) systolic dysfunction and diastolic      dysfunction by echo.   RECOMMENDATIONS:  I think Yvonne [name] is doing well.  We will plan to  continue her current medications and see her back in followup in a year.    Bruce Elvera Lennox Juanda Chance, MD, [facility]  Electronically Signed   BRB/MedQ  DD: 01/22/2008  DT: 01/23/2008  Job #: [id number]

## 2011-03-15 NOTE — Consult Note (Signed)
Yvonne [name], Yvonne [name] NO.:  [id number]   MEDICAL RECORD NO.:  [id number]          PATIENT TYPE:  EMS   LOCATION:  MAJO                         FACILITY:  [facility]   PHYSICIAN:  Learta Codding, M.D. LHCDATE OF BIRTH:  1929/09/15   DATE OF CONSULTATION:  05/31/2005  DATE OF DISCHARGE:                                   CONSULTATION   REASON FOR CONSULTATION:  Evaluation of a 77 year old female with rapid  supraventricular tachycardia.   HISTORY OF PRESENT ILLNESS:  The patient is a 77 year old white female with  no history of prior coronary artery disease who reports three days of  palpitations.  She went to her primary care physician today with the above  complaint.  However, her regular MD, Dr. Pete Glatter, was not available and  was ultimately referred to the emergency room.  She states that she normally  gets symptoms approximately once a month and last usually less than one  hour.  She has had symptoms continuously now since Wednesday.  She was sent  to the emergency room  where she received Adenosine and converted back to  normal sinus rhythm.  The patient did have frequent PVCs as well as bigeminy  after she received Adenosine.  The patient denies substernal chest pain  although she has some epigastric like pressure sensation prior to her tachy  arrhythmias.  She is essentially symptom free now.  She does relieve her  epigastric pain by Tagamet or cold water.  She denies any exertional  components of chest pain or shortness of breath.  The patient currently now  is in normal sinus rhythm after Adenosine with a left bundle branch pattern.  The patient did see Dr. Juanda Chance in the past, the most recent was on February 12, 2005.  She is known to have diastolic dysfunction by echocardiography with a  mild left ventricular systolic dysfunction.   ALLERGIES:  Aleve.   MEDICATIONS:  Estrace, Lopressor, Tylenol, multi-vitamin, p.r.n. Tagamet.   PAST MEDICAL HISTORY:   Notable for the absence of diabetes mellitus or  hypertension.  History of dyslipidemia.  History of supraventricular  tachycardia felt to be AV nodal re-entry tachycardia.  History of chronic  bundle branch block.  Mitral regurgitation.  GERD and peptic ulcer disease.  History of anxiety.   PAST SURGICAL HISTORY:  EGD and colonoscopy.   SOCIAL HISTORY:  The patient lives in [place] with her husband.  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  She exercises on a  treadmill three times a week.   FAMILY HISTORY:  Notable for a mother who died at age 50 from CVA.  Father  died at age 82 from congestive heart failure.  She has one brother who is  alive but has coronary artery disease.   REVIEW OF SYSTEMS:  No fevers, chills, headache, sore throat.  No shortness  of breath.  Positive for epigastric pain but no edema.  Positive for  palpitations.  No frequency or dysuria.  Positive for anxiety.  No myalgias  or arthralgias. No nausea  and vomiting.   PHYSICAL EXAMINATION:  VITAL SIGNS:  Blood pressure 140/66, heart rate 88, respirations 22,  temperature 97, saturation 92% on 100% O2.  GENERAL:  Well nourished white female, elderly, but in no distress.  HEENT:  NCAT, PERRLA, EOMI.  NECK:  Supple, normal carotid upstroke, no carotid bruits.  HEART:  Regular rate and rhythm with extra systoles, 2/6 systolic murmur.  LUNGS:  Clear breath sounds bilaterally.  ABDOMEN:  Soft, nontender, no rebound, no guarding.  GU AND RECTAL:  Examination was deferred.  EXTREMITIES:  No cyanosis, clubbing, and edema.  NEUROLOGICAL:  Alert and oriented, grossly normal, nonfocal.   Chest x-ray shows cardiomegaly, but no acute abnormalities.  EKG shows heart  rate 162 beats per minute, left bundle branch block, appears to be long  heart beat tachycardia.   LABORATORY DATA:  Hemoglobin 12.4, hematocrit 36.  Sodium 138, potassium  4.5, chloride 14, creatinine 1, glucose 87.   PROBLEM  LIST:  1.  Supraventricular tachycardia.      1.  Appears to be long RT tachycardia.      2.  Previously thought to be AV nodal re-entry tachycardia.      3.  Resolved with intravenous Adenosine.  2.  Atypical epigastric pain.      1.  Likely consistent with reflux.      2.  No evidence of ischemia or angina pain.  3.  Chronic left bundle branch block.  4.  History of diastolic dysfunction.  5.  History of mild left ventricular systolic dysfunction, previous      echocardiogram with EF 50%.   PLAN:  The patient converted immediately with Adenosine to normal sinus  rhythm.  She is hemodynamically stable with good blood pressure.  She was  given a dose of Lopressor in the emergency room.  Given the fact that the  patient is hemodynamically stable and has no evidence of underlying coronary  insufficiency, will plan to have her follow up with Dr. Juanda Chance as an  outpatient.  The patient has been given prescriptions for Lopressor 25 mg  p.o. b.i.d.  Dr. Juanda Chance will can then decide as an outpatient whether she needs a repeat  echocardiogram and/or a stress Cardiolite study.  I have also told the  patient to take an extra p.r.n. Lopressor in the event of recurrence.  It  appears that her PVCs are likely to trigger and initiating her re-entry  tachycardia.      Learta Codding, M.D. [facility]  Electronically Signed     GED/MEDQ  D:  05/31/2005  T:  05/31/2005  Job:  [id number]   cc:   Hal T. Stoneking, M.D.  [address]  [place], [place] [address]  Fax: [phone]   Charlies Constable, M.D. [facility]  [address]  [address]  [place]  [place] [address]

## 2011-03-15 NOTE — Assessment & Plan Note (Signed)
[facility]                            CARDIOLOGY OFFICE NOTE   NAME:Newport, Leida Lauth                         MRN:          [id number]  DATE:10/24/2006                            DOB:          May 08, 1929    CLINICAL HISTORY:  Miss Woodford is 77 years old and returned for follow up  management of her cardiac disease. She has left bundle branch block and  her echocardiogram shows diastolic disfunction and mild left ventricular  systolic disfunction with an ejection fraction of about 50%. She also  has a history supraventricular tachycardia which has been managed by  beta blockers.   She says she has done fairly well. She has had no recurrent  palpitations. She does have some chest discomfort which is in the lower  substernal area and is relieved by belching, it sounds more like  gastrointestinal  than cardiac. She also indicates she has symptoms of  fatigue. She wonders if some of her medications might be contributing to  this.   PAST MEDICAL HISTORY:  Significant for previous gastrointestinal  bleed.  She has had surgery for a cystocele and rectocele.   CURRENT MEDICATIONS:  Including; Estrace, centrum vitamins, metoprolol  25 b.i.d., __________ , and Tylenol.   PHYSICAL EXAMINATION:  Blood pressure is 128/71, pulse 67 and regular.  There is no venous distension.  The carotid pulses are full without  bruits.  CHEST: Clear.  HEART: Rhythm was regular. Had no murmurs or gallops.  ABDOMEN: Soft, normal bowel sounds. There is no hepatosplenomegaly.  Peripheral pulses are full. There is no peripheral edema.   Electrocardiogram showed sinus rhythm with frequent PVCs and left bundle  branch block.   IMPRESSION:  1. Supraventricular tachycardia controlled on beta blockers.  2. Left bundle branch block.  3. Mild left ventricular systolic disfunction and left ventricular      diastolic disfunction by echo.  4. Fatigue.   RECOMMENDATIONS:  I think we can  try and cut back on the beta blocker to  see if we can help with Miss Avetisyan's fatigue. We will switch her from  metoprolol 25 b.i.d. to Toprol XL 25 once a day. Also get laboratory  studies evaluated for fatigue including; a TSH, CBC, BMP, and magnesium  level. She has some symptoms of sinus congestion that have not responded  very well to Claritin and I told her if Dr. Pete Glatter does not have any  other good solutions for her that  she could use Claritin D realizing  there is a small risk she could  precipitate one of her supraventricular arrhythmias. I will plan to see  her back in follow up in a year.     Bruce Elvera Lennox Juanda Chance, MD, [facility]  Electronically Signed    BRB/MedQ  DD: 10/24/2006  DT: 10/24/2006  Job #: [id number]

## 2011-03-15 NOTE — Op Note (Signed)
[name], [name]                  ACCOUNT NO.:  [id number]   MEDICAL RECORD NO.:  [id number]          PATIENT TYPE:  AMB   LOCATION:  ENDO                         FACILITY:  [facility]   PHYSICIAN:  Danise Edge, M.D.   DATE OF BIRTH:  [birth year]   DATE OF PROCEDURE:  01/29/2005  DATE OF DISCHARGE:                                 OPERATIVE REPORT   PROCEDURE:  Esophagogastroduodenoscopy.   PROCEDURE INDICATION:  Ms. Yvonne [name] is a 77 year old female born Mar 07, 1929.  Ms. Yvonne [name] is undergoing esophagogastroduodenoscopy to rule out  recurrent peptic ulcer disease.   ENDOSCOPIST:  Reece Agar, M.D.   PREMEDICATION:  1.  Versed 5 mg.  2.  Demerol 50 mg.   PROCEDURE:  After obtaining informed consent, Ms. Yvonne [name] was placed in the  left lateral decubitus position.  I administered intravenous Demerol and  intravenous Versed to achieve conscious sedation for the procedure.  The  patient's blood pressure, oxygen saturation and cardiac rhythm were  monitored throughout the procedure and documented in the medical record.   The Olympus gastroscope was passed through the posterior hypopharynx into  the proximal esophagus without difficulty.  I did not visualize the vocal  cords.   Esophagoscopy:  The proximal mid and lower segments of the esophageal mucosa  appear completely normal.   Gastroscopy:  Retroflexed view of the gastric cardia and fundus was normal.  The gastric body appeared normal.  In the prepyloric gastric antrum there  appears to be a 99% healed ulcer (2 mm).  The area was biopsied.  The  pylorus appears normal.   Duodenoscopy:  The duodenal bulb, second portion of duodenum and third  portion of duodenum appeared normal.   ASSESSMENT:  A 99% healed prepyloric gastric antral ulcer which was  biopsied; otherwise normal esophagogastroduodenoscopy.      MJ/MEDQ  D:  01/29/2005  T:  01/29/2005  Job:  [id number]   cc:   Hal T. Stoneking, M.D.  [address] [place], [place] [address]  Fax: [phone]

## 2011-11-11 ENCOUNTER — Encounter: Payer: Self-pay | Admitting: *Deleted

## 2011-11-12 ENCOUNTER — Encounter: Payer: Self-pay | Admitting: Cardiology

## 2011-11-12 ENCOUNTER — Ambulatory Visit (INDEPENDENT_AMBULATORY_CARE_PROVIDER_SITE_OTHER): Payer: Medicare Other | Admitting: Cardiology

## 2011-11-12 VITALS — BP 147/73 | HR 72 | Ht 66.0 in | Wt 145.0 lb

## 2011-11-12 DIAGNOSIS — I471 Supraventricular tachycardia: Secondary | ICD-10-CM

## 2011-11-12 DIAGNOSIS — I447 Left bundle-branch block, unspecified: Secondary | ICD-10-CM

## 2011-11-12 DIAGNOSIS — Z8679 Personal history of other diseases of the circulatory system: Secondary | ICD-10-CM

## 2011-11-12 DIAGNOSIS — I498 Other specified cardiac arrhythmias: Secondary | ICD-10-CM

## 2011-11-12 NOTE — Patient Instructions (Signed)
Start aspirin 81mg  daily.  Your physician wants you to follow-up in: 1 year with Dr Shirlee Latch.(January 2014) You will receive a reminder letter in the mail two months in advance. If you don't receive a letter, please call our office to schedule the follow-up appointment.

## 2011-11-13 NOTE — Progress Notes (Signed)
PCP: Dr. Pete Glatter  78 yo with history of recurrent symptomatic SVT and chronic LBBB returns for cardiology followup.  She has been followed by Dr. Juanda Chance in the past and is seen by me for the first time today.  Her most recent severe SVT episode was back in 12/10.  She was admitted with SVT and eventually converted to NSR with diltiazem IV.  She had elevated troponin and a myoview was done that was normal.  She has been on po diltiazem since that time.  She has seen EP and does not want ablation.  She continues to have periodic palpitations.  She has 1-2 episodes every couple of months.  There is no trigger.  Yesterday, she had an episode where she felt her heart racing and felt weak.  She sat down and the spell resolved after a few mintues.  Since 12/10, she has not had a prolonged episode.  No lightheadedness or syncope.  No chest pain.  No exertional dyspnea.  She avoids caffeine and stimulant decongestants.    ECG: NSR, LBBB  Labs (10/12): K 4.3, creatinine 0.85, LDL 143, HDL 64  PMH: 1. Sinusitis: Recurrent. 2. SVT: Medical treatment.  Diagnosed in 1990s. Had episode in 2006 terminated by adenosine.  12/10 hospitalization with SVT, converted to NSR eventually on diltiazem.  Has not wanted ablation.  3. H/o GI bleeding from PUD (distant past) 4. GERD 5. LBBB 6. Hyperlipidemia 7. Myoview (1/11): EF 66%, normal 8. Echo (7/10): EF 50-55%, mild focal basal septal hypertrophy, mild MI, mild AI.    SH: Married to [employer], retired Academic librarian, nonsmoker, lives in [place]  FH: Father with CHF, grandmother/mother/2 sisters with CVAs  ROS: All systems reviewed and negative except as per HPI.   Current Outpatient Prescriptions  Medication Sig Dispense Refill  . Calcium-Magnesium-Zinc (CAL-MAG-ZINC PO) Take by mouth daily.      . cetirizine (ZYRTEC) 10 MG tablet Take 10 mg by mouth daily.      . clorazepate (TRANXENE) 7.5 MG tablet Take 7.5 mg by mouth 2 (two) times daily as  needed.      . diltiazem (CARDIZEM CD) 120 MG 24 hr capsule Take 120 mg by mouth daily.      Marland Kitchen estradiol (ESTRACE) 1 MG tablet Take 0.5 mg by mouth daily.      . Multiple Vitamins-Minerals (CENTRUM SILVER PO) Take by mouth daily.      . traMADol (ULTRAM) 50 MG tablet Take 50 mg by mouth every 6 (six) hours as needed.      . vitamin C (ASCORBIC ACID) 500 MG tablet Take 500 mg by mouth daily.      Marland Kitchen aspirin EC 81 MG tablet Take 1 tablet (81 mg total) by mouth daily.        BP 147/73  Pulse 72  Ht 5\' 6"  (1.676 m)  Wt 65.772 kg (145 lb)  BMI 23.40 kg/m2 General: NAD Neck: No JVD, no thyromegaly or thyroid nodule.  Lungs: Clear to auscultation bilaterally with normal respiratory effort. CV: Nondisplaced PMI.  Heart regular S1/S2, no S3/S4, no murmur.  No peripheral edema.  No carotid bruit.  Normal pedal pulses.  Abdomen: Soft, nontender, no hepatosplenomegaly, no distention.  Neurologic: Alert and oriented x 3.  Psych: Normal affect. Extremities: No clubbing or cyanosis.

## 2011-11-13 NOTE — Assessment & Plan Note (Addendum)
Chronic.  EF low normal on prior echo and myoview normal in 1/11.   I am going to have Yvonne [name] start on ASA 81 mg daily given her strong family history of stroke.  She has had prior GI bleeding but nothing recently.

## 2011-11-13 NOTE — Assessment & Plan Note (Addendum)
Periodic tachypalpitations may be short runs of SVT.  She has had no prolonged episodes since starting diltiazem.  Given elevated BP today, I suggested that we increase diltiazem.  Apparently, she tried going up to 240 mg daily in the past and had severe side effects. Therefore, we decided to hold off on this.  She can start on a beta blocker if symptoms get worse.  She has not wanted ablation.  Would continue to avoid caffeine and stimulant decongestants.  She can use Coricidin if a decongestant is needed.

## 2011-11-19 ENCOUNTER — Telehealth: Payer: Self-pay | Admitting: Cardiology

## 2011-11-19 MED ORDER — DILTIAZEM HCL ER COATED BEADS 120 MG PO CP24: 120.0000 mg | ORAL_CAPSULE | Freq: Every day | ORAL | Status: DC

## 2011-11-19 NOTE — Telephone Encounter (Signed)
Pt requesting refill for Diltiazem CD 120mg . I will do this now.

## 2011-11-19 NOTE — Telephone Encounter (Signed)
New Msg: Pt calling wanting to speak to nurse regarding RX. Please return pt call to discuss further.

## 2012-01-27 ENCOUNTER — Other Ambulatory Visit: Payer: Self-pay | Admitting: Geriatric Medicine

## 2012-01-27 DIAGNOSIS — Z1231 Encounter for screening mammogram for malignant neoplasm of breast: Secondary | ICD-10-CM

## 2012-02-03 ENCOUNTER — Ambulatory Visit
Admission: RE | Admit: 2012-02-03 | Discharge: 2012-02-03 | Disposition: A | Discharge status: Home / Self Care | Payer: Medicare Other | Source: Ambulatory Visit | Attending: Geriatric Medicine | Admitting: Geriatric Medicine

## 2012-02-03 DIAGNOSIS — Z1231 Encounter for screening mammogram for malignant neoplasm of breast: Secondary | ICD-10-CM

## 2012-06-23 ENCOUNTER — Other Ambulatory Visit: Payer: Self-pay | Admitting: Dermatology

## 2012-11-19 ENCOUNTER — Telehealth: Payer: Self-pay | Admitting: Internal Medicine

## 2012-11-19 NOTE — Telephone Encounter (Signed)
Diltiazem refill needed, asap , the pharmacy requested twice, pt now out  [facility]

## 2012-11-20 MED ORDER — DILTIAZEM HCL ER COATED BEADS 120 MG PO CP24: 120.0000 mg | ORAL_CAPSULE | Freq: Every day | ORAL | Status: DC

## 2012-12-09 ENCOUNTER — Ambulatory Visit: Payer: Medicare Other | Admitting: Internal Medicine

## 2012-12-22 ENCOUNTER — Ambulatory Visit: Payer: Medicare Other | Admitting: Internal Medicine

## 2013-01-05 ENCOUNTER — Telehealth: Payer: Self-pay | Admitting: Internal Medicine

## 2013-01-15 ENCOUNTER — Other Ambulatory Visit: Payer: Self-pay | Admitting: Dermatology

## 2013-01-27 ENCOUNTER — Ambulatory Visit (INDEPENDENT_AMBULATORY_CARE_PROVIDER_SITE_OTHER): Payer: Medicare Other | Admitting: Internal Medicine

## 2013-01-27 ENCOUNTER — Encounter: Payer: Self-pay | Admitting: Internal Medicine

## 2013-01-27 VITALS — BP 142/75 | HR 82 | Ht 66.5 in | Wt 147.6 lb

## 2013-01-27 DIAGNOSIS — R002 Palpitations: Secondary | ICD-10-CM

## 2013-01-27 NOTE — Patient Instructions (Addendum)
Your physician has recommended that you wear an event monitor for 30 days. Event monitors are medical devices that record the heart's electrical activity. Doctors most often [place] these monitors to diagnose arrhythmias. Arrhythmias are problems with the speed or rhythm of the heartbeat. The monitor is a small, portable device. You can wear one while you do your normal daily activities. This is usually used to diagnose what is causing palpitations/syncope (passing out).  Your physician recommends that you schedule a follow-up appointment in: 5 weeks (approx)-after event monitor results are back

## 2013-01-27 NOTE — Progress Notes (Addendum)
Patient Care Team: Hal Stormy Fabian, MD as PCP - General (Internal Medicine)   HPI  Yvonne [name] is a 79 y.o. female .seen in followup for supraventricular tachycardia for which medical therapy has been elected  She has chronic left bundle branch block. Echocardiogram 2003 demonstrated mild dysfunction of 50% with basal hypertrophy described as mild   She recently he has had a different type of palpitation syndrome. These have been more irregular. She has not had the same pounding sensation in her chest is actually felt more in her neck. It is associated with some lightheadedness. There have been times when he's had 2 or 3 per day and multiple once a week. She has not had any in the last week or 2.      Past Medical History  Diagnosis Date  . Hemorrhage of gastrointestinal tract, unspecified   . Anxiety state, unspecified   . Left ventricular systolic dysfunction     hx of mild  . History of peptic ulcer   . Esophageal reflux   . Mitral valve insufficiency and aortic valve insufficiency   . Palpitations     history  . Other left bundle branch block   . Other primary cardiomyopathies   . Paroxysmal supraventricular tachycardia     Past Surgical History  Procedure Laterality Date  . Esophagogastroduodenoscopy      Current Outpatient Prescriptions  Medication Sig Dispense Refill  . aspirin EC 81 MG tablet Take 1 tablet (81 mg total) by mouth daily.      . Calcium-Magnesium-Zinc (CAL-MAG-ZINC PO) Take by mouth daily.      . cetirizine (ZYRTEC) 10 MG tablet Take 10 mg by mouth daily.      . clorazepate (TRANXENE) 7.5 MG tablet Take 7.5 mg by mouth 2 (two) times daily as needed.      . diltiazem (CARDIZEM CD) 120 MG 24 hr capsule Take 1 capsule (120 mg total) by mouth daily.  30 capsule  11  . estradiol (ESTRACE) 1 MG tablet Take 0.5 mg by mouth daily.      . Multiple Vitamins-Minerals (CENTRUM SILVER PO) Take by mouth daily.      . traMADol (ULTRAM) 50 MG tablet Take 50 mg  by mouth every 6 (six) hours as needed.      . vitamin C (ASCORBIC ACID) 500 MG tablet Take 500 mg by mouth daily.       No current facility-administered medications for this visit.    Allergies  Allergen Reactions  . Aspirin     In high doses    Review of Systems negative except from HPI and PMH  Physical Exam BP 142/75  Pulse 82  Ht 5' 6.5" (1.689 m)  Wt 147 lb 9.6 oz (66.951 kg)  BMI 23.47 kg/m2  Well developed and nourished in no acute distress HENT normal Neck supple with JVP-flat Clear Regular rate and rhythm, no murmurs or gallops Abd-soft with active BS No Clubbing cyanosis edema Skin-warm and dry A & Oriented  Grossly normal sensory and motor function  ECG is sinus rhythm at 73 interval 17/15/46 Axis -59   left bundle branch block with left axis deviation  Assessment and  Plan

## 2013-01-27 NOTE — Assessment & Plan Note (Signed)
The patient has a long history of palpitations with a diagnosis of SVT. Over recent months she has had a different palpitation syndrome notable for irregularity. There is some degree o  frog positivity. It is not clearly whether this represents SVT or atrial fibrillation. He needed for implications as relates to anticoagulation we'll undertake an event monitor.

## 2013-02-02 ENCOUNTER — Telehealth: Payer: Self-pay | Admitting: Internal Medicine

## 2013-02-02 NOTE — Telephone Encounter (Signed)
Called patient to inquire about monitor appointment.  Patient states she cancelled her monitor appointment because she has an area of redness under her left breast that is approximately the size of half of her palm.  She is under the treatment of a dermatologist for this issue but was concerned about the placement of the monitor pads on her skin so she cancelled her appointment.  Advised patient that it is important to get this monitor placed and that new appointment needs to be made.  Patient states she was concerned about the missed appointment fee if she had a Monday appointment that she was unable to keep.  Advised patient to make an appointment for mid to late next week when Rx ointment has had time to work on this problem area of skin.  Routed to [facility] to call patient for new appointment.

## 2013-02-02 NOTE — Telephone Encounter (Signed)
Event monitor schedule 02/05/13. Follow-up with [facility] 03/26/13 @ 10:15 .

## 2013-02-02 NOTE — Telephone Encounter (Signed)
New Problem:    Patient called in wanting to let Dr. Graciela Husbands know that she canceled her monitor placement appointment.  She was supposed to have surgery prior to having this monitor placed but was unable to have her surgery due to complications.  Her complications were later revealed to be due to her not having her monitor placement appointment first.  Please call back if you have any questions.

## 2013-02-03 ENCOUNTER — Telehealth: Payer: Self-pay | Admitting: Internal Medicine

## 2013-02-03 NOTE — Telephone Encounter (Signed)
Pt states she is unable to wear monitor because of the reddened area around her breast.  She is under the care of a dermatologist for this.  She wanted Dr Graciela Husbands to know that she has not had her surgery yet.  She states she has an appt with her surgeon at the end of the month and they will decide if they can do surgery at that time depending on the amount of redness around the basal cell area.  She wants Dr Graciela Husbands to advise her as to if there is another option.  She states it even hurts for her to wear clothing.

## 2013-02-08 NOTE — Telephone Encounter (Signed)
I spoke with the patient. She states that she is still having problems with the red area under her left breast. She went to the dermatologist and the area was rubbed to the point that a small scab appeared that now has a little drainage. She was given a topical ointment to put on this which she could not tolerate. She was told by the pharmacist this should have never been placed on any area with an opening. She is wearing a patch over this area now and she may have to have it for another month. She is concerned about this interfering with her wearing her monitor and placement of the patches for the monitor. The area affected sounds about the size of her hand. I will forward to Dr. Graciela Husbands to let him know. Her episodes of palpitations have been less frequent. We will call her back after reviewed by Dr. Graciela Husbands. She is worried about the potential of a-fib and risk for clot. I do not know if we still can use biowatch monitors.

## 2013-02-08 NOTE — Telephone Encounter (Signed)
Follow up     Need to discuss heart monitor situation

## 2013-02-09 NOTE — Telephone Encounter (Signed)
She can moe the patches as necessary to accomodae the monitoring   If we cant use the external monitor we can insert one fo the new injectable ones thanks

## 2013-02-10 NOTE — Telephone Encounter (Signed)
Spoke with pt, she reports the area under her breast is much better. She is ready to try to wear the monitor. Will get appt made for pt to pick up monitor.

## 2013-02-16 ENCOUNTER — Ambulatory Visit (INDEPENDENT_AMBULATORY_CARE_PROVIDER_SITE_OTHER): Payer: Medicare Other

## 2013-02-16 DIAGNOSIS — R002 Palpitations: Secondary | ICD-10-CM

## 2013-02-16 NOTE — Progress Notes (Signed)
Placed 30 day event monitor patient

## 2013-02-22 NOTE — Telephone Encounter (Deleted)
error 

## 2013-03-17 ENCOUNTER — Telehealth: Payer: Self-pay | Admitting: Internal Medicine

## 2013-03-17 NOTE — Telephone Encounter (Deleted)
error 

## 2013-03-18 ENCOUNTER — Telehealth: Payer: Self-pay | Admitting: Internal Medicine

## 2013-03-18 NOTE — Telephone Encounter (Signed)
New Problem:    Called in wanting you to give her a call back.  Patient declined wanting to have her message listed only wishing to leave her number and only wanting to speak with Rose.  Please call back.

## 2013-03-24 ENCOUNTER — Ambulatory Visit: Payer: Medicare Other | Admitting: Internal Medicine

## 2013-03-26 ENCOUNTER — Encounter: Payer: Self-pay | Admitting: Internal Medicine

## 2013-03-26 ENCOUNTER — Ambulatory Visit (INDEPENDENT_AMBULATORY_CARE_PROVIDER_SITE_OTHER): Payer: Medicare Other | Admitting: Internal Medicine

## 2013-03-26 VITALS — BP 130/70 | HR 76 | Ht 66.5 in | Wt 144.8 lb

## 2013-03-26 DIAGNOSIS — I471 Supraventricular tachycardia: Secondary | ICD-10-CM

## 2013-03-26 NOTE — Patient Instructions (Signed)
Your physician wants you to follow-up in: 6 months with Dr. Klein. You will receive a reminder letter in the mail two months in advance. If you don't receive a letter, please call our office to schedule the follow-up appointment.  

## 2013-03-26 NOTE — Assessment & Plan Note (Signed)
Recurrent SVT but no afib Not interested in RF therapy

## 2013-03-26 NOTE — Progress Notes (Signed)
Patient Care Team: Hal Stormy Fabian, MD as PCP - General (Internal Medicine)   HPI  Yvonne [name] is a 79 y.o. female Seen in followup for supraventricular tachycardia for which she has elected medical therapy. She recently had an irregular palpitation syndrome.   She is doing preety well   Echocardiogram 2010 mild LV dysfunction 50-55% with some hypokinesis. She has chronic left bundle branch block    Past Medical History  Diagnosis Date  . Hemorrhage of gastrointestinal tract, unspecified   . Anxiety state, unspecified   . Left ventricular systolic dysfunction     hx of mild  . History of peptic ulcer   . Esophageal reflux   . Mitral valve insufficiency and aortic valve insufficiency   . Palpitations     history  . Other left bundle branch block   . Other primary cardiomyopathies   . Paroxysmal supraventricular tachycardia     Past Surgical History  Procedure Laterality Date  . Esophagogastroduodenoscopy      Current Outpatient Prescriptions  Medication Sig Dispense Refill  . aspirin EC 81 MG tablet Take 1 tablet (81 mg total) by mouth daily.      . Calcium-Magnesium-Zinc (CAL-MAG-ZINC PO) Take by mouth daily.      . clorazepate (TRANXENE) 7.5 MG tablet Take 7.5 mg by mouth 2 (two) times daily as needed.      . diltiazem (CARDIZEM CD) 120 MG 24 hr capsule Take 1 capsule (120 mg total) by mouth daily.  30 capsule  11  . estradiol (ESTRACE) 1 MG tablet Take 0.5 mg by mouth daily.      . Multiple Vitamins-Minerals (CENTRUM SILVER PO) Take by mouth daily.      . traMADol (ULTRAM) 50 MG tablet Take 50 mg by mouth every 6 (six) hours as needed.      . vitamin C (ASCORBIC ACID) 500 MG tablet Take 500 mg by mouth daily.      Marland Kitchen VITAMIN E PO Take by mouth.       No current facility-administered medications for this visit.    Allergies  Allergen Reactions  . Aspirin     In high doses    Review of Systems negative except from HPI and PMH  Physical Exam BP 130/70   Pulse 76  Ht 5' 6.5" (1.689 m)  Wt 144 lb 12.8 oz (65.681 kg)  BMI 23.02 kg/m2  SpO2 99% Well developed and nourished in no acute distress HENT normal Neck supple with JVP-flat Clear Regular rate and rhythm, no murmurs or gallops Abd-soft with active BS No Clubbing cyanosis edema Skin-warm and dry A & Oriented  Grossly normal sensory and motor function    Assessment and  Plan

## 2013-09-03 ENCOUNTER — Other Ambulatory Visit: Payer: Self-pay | Admitting: Dermatology

## 2013-10-05 ENCOUNTER — Telehealth: Payer: Self-pay | Admitting: Internal Medicine

## 2013-10-05 NOTE — Telephone Encounter (Signed)
New Problem   Pt called her heart is racing and would like to be seen please.  Give pt a call I offered a PA does not want to see one.

## 2013-10-05 NOTE — Telephone Encounter (Signed)
Pt scheduled for OV, this Friday 12/12, at noon. Pt agreeable to plan.

## 2013-10-08 ENCOUNTER — Telehealth: Payer: Self-pay

## 2013-10-08 ENCOUNTER — Ambulatory Visit (INDEPENDENT_AMBULATORY_CARE_PROVIDER_SITE_OTHER): Payer: Medicare Other | Admitting: Internal Medicine

## 2013-10-08 ENCOUNTER — Encounter: Payer: Self-pay | Admitting: Internal Medicine

## 2013-10-08 VITALS — BP 137/82 | HR 80 | Ht 66.5 in | Wt 144.4 lb

## 2013-10-08 DIAGNOSIS — I471 Supraventricular tachycardia: Secondary | ICD-10-CM

## 2013-10-08 DIAGNOSIS — R002 Palpitations: Secondary | ICD-10-CM

## 2013-10-08 MED ORDER — ATENOLOL 50 MG PO TABS: 50.0000 mg | ORAL_TABLET | Freq: Two times a day (BID) | ORAL | Status: DC

## 2013-10-08 MED ORDER — PROPRANOLOL HCL ER 80 MG PO CP24: 80.0000 mg | ORAL_CAPSULE | Freq: Every day | ORAL | Status: DC

## 2013-10-08 MED ORDER — METOPROLOL SUCCINATE ER 50 MG PO TB24: 50.0000 mg | ORAL_TABLET | Freq: Every day | ORAL | Status: DC

## 2013-10-08 NOTE — Telephone Encounter (Signed)
called pharmacy to let them know how patient is to take inderal,metoprolol , and atenolol

## 2013-10-08 NOTE — Assessment & Plan Note (Signed)
Recurrent tachypalpitations. We will begin her on beta blockers as an alternative the higher dose calcium blockers. She remains on first to the idea of catheter ablation.  We'll give her a prescription for atenolol 50, Inderal LA 80, and metoprolol succinate 50. She is to take a half a dose in the eveningt for 3 or 4 days and then a hold those if she does not have significant side effects. She didn't take in the morning and she desires

## 2013-10-08 NOTE — Progress Notes (Signed)
      Patient Care Team: Merlene Laughter, MD as PCP - General (Internal Medicine)   HPI  Yvonne [name] is a 79 y.o. female Seen in followup for supraventricular tachycardia for which she has elected medical therapy. She recently had an irregular palpitation syndrome. She is doing preety well  Echocardiogram 2010 mild LV dysfunction 50-55% with some hypokinesis. She has chronic left bundle branch block  She had an episode a couple of weeks ago that lasted a couple of hours. It was very disturbing.  She has not tolerated higher doses of calcium blockers because of significant constipation. Remotely she was on atenolol without recalled  difficulties   Past Medical History  Diagnosis Date  . Hemorrhage of gastrointestinal tract, unspecified   . Anxiety state, unspecified   . Left ventricular systolic dysfunction     hx of mild  . History of peptic ulcer   . Esophageal reflux   . Mitral valve insufficiency and aortic valve insufficiency   . Palpitations     history  . Other left bundle branch block   . Other primary cardiomyopathies   . Paroxysmal supraventricular tachycardia     Past Surgical History  Procedure Laterality Date  . Esophagogastroduodenoscopy      Current Outpatient Prescriptions  Medication Sig Dispense Refill  . aspirin EC 81 MG tablet Take 1 tablet (81 mg total) by mouth daily.      . Calcium-Magnesium-Zinc (CAL-MAG-ZINC PO) Take by mouth daily.      . clorazepate (TRANXENE) 7.5 MG tablet Take 7.5 mg by mouth 2 (two) times daily as needed.      . diltiazem (CARDIZEM CD) 120 MG 24 hr capsule Take 1 capsule (120 mg total) by mouth daily.  30 capsule  11  . estradiol (ESTRACE) 1 MG tablet Take 0.5 mg by mouth daily.      . Multiple Vitamins-Minerals (CENTRUM SILVER PO) Take by mouth daily.      . traMADol (ULTRAM) 50 MG tablet Take 50 mg by mouth every 6 (six) hours as needed.      . vitamin C (ASCORBIC ACID) 500 MG tablet Take 500 mg by mouth daily.      Marland Kitchen  VITAMIN E PO Take by mouth.       No current facility-administered medications for this visit.    Allergies  Allergen Reactions  . Aspirin     In high doses    Review of Systems negative except from HPI and PMH  Physical Exam BP 137/82  Pulse 80  Ht 5' 6.5" (1.689 m)  Wt 144 lb 6.4 oz (65.499 kg)  BMI 22.96 kg/m2 Well developed and nourished in no acute distress HENT normal Neck supple with JVP-flat Clear Regular rate and rhythm, no murmurs or gallops Abd-soft with active BS No Clubbing cyanosis edema Skin-warm and dry A & Oriented  Grossly normal sensory and motor function   ECG NSR with LBBB   Assessment and  Plan

## 2013-10-08 NOTE — Patient Instructions (Addendum)
Your physician has recommended you make the following change in your medication:  1) Start Atenolol 50 mg twice daily - take 1/2 dose twice daily for 3/4 days, then increase to whole dose twice daily for few days   If not tolerating Atenolol, then 2) Start Inderal LA 80 mg daily - take 1/2 dose daily for 3/4 days, then increase to whole dose daily for few days  If not tolerating Inderal, then 3) Start Metoprolol Succinate 50 mg daily - take 1/2 dose daily for 3/4 days, then increase to whole dose daily for few days  Please call us once decision has been made on medication that you would like to remain taking out of the above medications.  Your physician recommends that you schedule a follow-up appointment in: 3 months with Dr. Graciela Husbands.

## 2013-10-08 NOTE — Telephone Encounter (Signed)
Called pharmacy to let them know  How patient is taking inderal, metoprolol and atenolol

## 2013-10-11 ENCOUNTER — Telehealth: Payer: Self-pay | Admitting: Internal Medicine

## 2013-10-11 NOTE — Telephone Encounter (Signed)
New Message  Pt called states that she was placed on a new medication// She states that she took the recommended dosages on Sunday and Saturday night// She states that her heart is constantly racing .Marland Kitchen Please call back to assist.

## 2013-10-11 NOTE — Telephone Encounter (Signed)
Pt complains that her HR has been up today, but she cannot tell me rate. When trying to count HR, came up with 60, but this isn't correct according to patient. She states she has been slightly SOB. She did not start Atenolol because it stopped working for her a long time ago. She did start the Inderal on Saturday night. Advised to take again tonight and I will review with Dr. Graciela Husbands in office tomorrow morning. I also advised her to go to ED if symptoms worsened or HR elevated too high overnight. Pt agreeable to plan.

## 2013-10-12 NOTE — Telephone Encounter (Signed)
Pt states that she feels much better today. She hasn't been sleeping well at night and is going to start taking Inderal in morning and not at night. I advised her to call if she needed to start taking med twice daily (ok'd to take 2 times a day per Dr. Graciela Husbands). Pt agreeable to plan.

## 2013-10-19 ENCOUNTER — Other Ambulatory Visit: Payer: Self-pay

## 2013-10-19 MED ORDER — PROPRANOLOL HCL ER 80 MG PO CP24: 80.0000 mg | ORAL_CAPSULE | Freq: Every day | ORAL | Status: DC

## 2013-10-22 ENCOUNTER — Encounter (HOSPITAL_COMMUNITY): Payer: Self-pay | Admitting: Emergency Medicine

## 2013-10-22 ENCOUNTER — Emergency Department (HOSPITAL_COMMUNITY)
Admission: EM | Admit: 2013-10-22 | Discharge: 2013-10-22 | Disposition: A | Discharge status: Home / Self Care | Payer: Medicare Other | Source: Home / Self Care | Attending: Family Medicine | Admitting: Family Medicine

## 2013-10-22 ENCOUNTER — Emergency Department (INDEPENDENT_AMBULATORY_CARE_PROVIDER_SITE_OTHER): Payer: Medicare Other

## 2013-10-22 DIAGNOSIS — S62309A Unspecified fracture of unspecified metacarpal bone, initial encounter for closed fracture: Secondary | ICD-10-CM

## 2013-10-22 NOTE — ED Notes (Signed)
Patient reports a fall yesterday while at home.  Patient blames fall on rubber soled shoes gripping carpet, fell forward.  Abrasion and bruising on left forearm.  And bruising and pain to left hand.  Palm and back of hand bruised.  Describes little finger was out of normal position.

## 2013-10-22 NOTE — ED Notes (Signed)
Ortho notified and has returned call

## 2013-10-22 NOTE — Progress Notes (Signed)
Orthopedic Tech Progress Note Patient Details:  Yvonne [name] 11/03/1928 [id number] Ulna gutter splint applied to Left UE. Application tolerated well.  Ortho Devices Type of Ortho Device: Ace wrap;Ulna gutter splint Ortho Device/Splint Location: Left UE Ortho Device/Splint Interventions: Application   Asia R Thompson 10/22/2013, 1:47 PM

## 2013-10-22 NOTE — ED Notes (Signed)
Cannot discharge patient, ortho coming to see patient

## 2013-10-22 NOTE — ED Provider Notes (Signed)
CSN: [id number]     Arrival date & time 10/22/13  1023 History   First MD Initiated Contact with Patient 10/22/13 1125     Chief Complaint  Patient presents with  . Hand Pain  . Arm Pain   (Consider location/radiation/quality/duration/timing/severity/associated sxs/prior Treatment) Patient is a 79 y.o. female presenting with hand injury. The history is provided by the patient.  Hand Injury Location:  Hand Time since incident:  1 day Injury: yes   Mechanism of injury: fall   Fall:    Fall occurred:  Walking   Impact surface:  Water quality scientist of impact:  Hands   Entrapped after fall: no   Hand location:  L hand Pain details:    Quality:  Sharp   Radiates to:  Does not radiate   Severity:  Moderate   Onset quality:  Sudden   Progression:  Unchanged Chronicity:  New Dislocation: no   Prior injury to area:  No Worsened by:  Movement Associated symptoms: no back pain and no neck pain     Past Medical History  Diagnosis Date  . Hemorrhage of gastrointestinal tract, unspecified   . Anxiety state, unspecified   . Left ventricular systolic dysfunction     hx of mild  . History of peptic ulcer   . Esophageal reflux   . Mitral valve insufficiency and aortic valve insufficiency   . Palpitations     history  . Other left bundle branch block   . Other primary cardiomyopathies   . Paroxysmal supraventricular tachycardia    Past Surgical History  Procedure Laterality Date  . Esophagogastroduodenoscopy     No family history on file. History  Substance Use Topics  . Smoking status: Never Smoker   . Smokeless tobacco: Never Used  . Alcohol Use: No   OB History   Grav Para Term Preterm Abortions TAB SAB Ect Mult Living                 Review of Systems  Constitutional: Negative.   Musculoskeletal: Negative for back pain, gait problem and neck pain.  Skin: Positive for wound.    Allergies  Aspirin  Home Medications   Current Outpatient Rx  Name  Route  Sig   Dispense  Refill  . aspirin EC 81 MG tablet   Oral   Take 1 tablet (81 mg total) by mouth daily.         Marland Kitchen atenolol (TENORMIN) 50 MG tablet   Oral   Take 1 tablet (50 mg total) by mouth 2 (two) times daily.   30 tablet   0   . Calcium-Magnesium-Zinc (CAL-MAG-ZINC PO)   Oral   Take by mouth daily.         . clorazepate (TRANXENE) 7.5 MG tablet   Oral   Take 7.5 mg by mouth 2 (two) times daily as needed.         . diltiazem (CARDIZEM CD) 120 MG 24 hr capsule   Oral   Take 1 capsule (120 mg total) by mouth daily.   30 capsule   11   . estradiol (ESTRACE) 1 MG tablet   Oral   Take 0.5 mg by mouth daily.         . metoprolol succinate (TOPROL-XL) 50 MG 24 hr tablet   Oral   Take 1 tablet (50 mg total) by mouth daily. Take with or immediately following a meal.   15 tablet   0   . Multiple  Vitamins-Minerals (CENTRUM SILVER PO)   Oral   Take by mouth daily.         . propranolol ER (INDERAL LA) 80 MG 24 hr capsule   Oral   Take 1 capsule (80 mg total) by mouth daily.   90 capsule   0     IF 90 DAYS IS TOO MUCH , PATIENT CAN HAVE 30 DAYS  ...   . traMADol (ULTRAM) 50 MG tablet   Oral   Take 50 mg by mouth every 6 (six) hours as needed.         . vitamin C (ASCORBIC ACID) 500 MG tablet   Oral   Take 500 mg by mouth daily.         Marland Kitchen VITAMIN E PO   Oral   Take by mouth.          BP 140/83  Pulse 68  Temp(Src) 97.7 F (36.5 C) (Oral)  Resp 16  SpO2 98% Physical Exam  Nursing note and vitals reviewed. Constitutional: She is oriented to person, place, and time. She appears well-developed and well-nourished.  HENT:  Head: Normocephalic and atraumatic.  Neck: Normal range of motion. Neck supple.  Musculoskeletal: She exhibits tenderness.       Hands: Neurological: She is alert and oriented to person, place, and time.  Skin:  Linear abrasion to left midforarm    ED Course  Procedures (including critical care time) Labs Review Labs  Reviewed - No data to display Imaging Review Dg Hand Complete Left  10/22/2013   CLINICAL DATA:  Fall.  EXAM: LEFT HAND - COMPLETE 3+ VIEW  COMPARISON:  No prior.  FINDINGS: Angulated fracture of the left 5th metacarpal is present. No other focal acute abnormalities are identified. Diffuse degenerative changes present about the hand and wrist.  IMPRESSION: Angulated fracture left 5th metacarpal is present.   Electronically Signed   By: Maisie Fus  Register   On: 10/22/2013 12:25    EKG Interpretation    Date/Time:    Ventricular Rate:    PR Interval:    QRS Duration:   QT Interval:    QTC Calculation:   R Axis:     Text Interpretation:              MDM      Linna Hoff, MD 10/22/13 1300

## 2013-11-02 ENCOUNTER — Other Ambulatory Visit: Payer: Self-pay | Admitting: Orthopedic Surgery

## 2013-11-02 ENCOUNTER — Telehealth: Payer: Self-pay | Admitting: Internal Medicine

## 2013-11-02 NOTE — Telephone Encounter (Signed)
New message      Pt wants Yvonne [name] to give him a call about med

## 2013-11-03 ENCOUNTER — Encounter (HOSPITAL_BASED_OUTPATIENT_CLINIC_OR_DEPARTMENT_OTHER): Payer: Self-pay | Admitting: *Deleted

## 2013-11-03 NOTE — Telephone Encounter (Signed)
Pt broke hand at christmas, surgery on Friday - asking if she needs to stop her Inderal or ASA. Referred pt to surgeon for recommendations of stopping/continueing medications. Pt agreeable to plan.

## 2013-11-03 NOTE — Progress Notes (Signed)
Pt sees dr Caryl Comes for svt-has been on several meds-taking inderal now-had recent labs-if nec-may need istat ekg-12/14

## 2013-11-04 NOTE — H&P (Signed)
  Yvonne [name] is an 80 y.o. female.   Chief Complaint: c/o fracture left small metacarpal HPI: Yvonne [name] comes in referred by Dr. Melanee Spry from the [facility] for consult regarding her displaced fracture of the left small finger metacarpal.  Yvonne [name] is a long term patient.  I last saw her in July of 2014.  She fell at home on Christmas Day when she caught her slipper on the carpet.  She sustained a comminuted fracture of her left small finger metacarpal.  She was placed in an ulnar gutter splint and referred for follow-up.   Past Medical History  Diagnosis Date  . Hemorrhage of gastrointestinal tract, unspecified   . Anxiety state, unspecified   . Left ventricular systolic dysfunction     hx of mild  . History of peptic ulcer   . Esophageal reflux   . Mitral valve insufficiency and aortic valve insufficiency   . Palpitations     history  . Other primary cardiomyopathies   . Wears glasses   . Other left bundle branch block   . Paroxysmal supraventricular tachycardia   . LBBB (left bundle branch block)     Past Surgical History  Procedure Laterality Date  . Esophagogastroduodenoscopy    . Tonsillectomy    . Appendectomy    . Abdominal hysterectomy    . Tubal ligation    . Knee arthroscopy  2009    left  . Breast surgery      biopsy    History reviewed. No pertinent family history. Social History:  reports that she has never smoked. She has never used smokeless tobacco. She reports that she does not drink alcohol or use illicit drugs.  Allergies:  Allergies  Allergen Reactions  . Aleve [Naproxen Sodium]     Gi bleed  . Aspirin     In high doses    No prescriptions prior to admission    No results found for this or any previous visit (from the past 48 hour(s)).  No results found.   Pertinent items are noted in HPI.  Height 5' 6.5" (1.689 m), weight 65.318 kg (144 lb).  General appearance: alert Head: Normocephalic, without obvious  abnormality Neck: supple, symmetrical, trachea midline Resp: clear to auscultation bilaterally Cardio: regular rate and rhythm GI: normal findings: bowel sounds normal Extremities: .  She has a displaced metaphyseal diaphyseal fracture of her left small finger metacarpal with apex dorsal angulation.  She has minimal malrotation.  Her neurovascular examination is intact.  She has no sign of other injury to her wrist, adjacent metacarpals, thumb or finger phalanges.   Pulses: 2+ and symmetric Skin: normal Neurologic: Grossly normal    Assessment/Plan Impression: Left small metacarpal fracture.  Plan: To the OR for ORIF left small metacarpal fracture  DASNOIT,Afshin Chrystal J 11/04/2013, 4:56 PM  H&P documentation: 11/05/2013  -History and Physical Reviewed  -Patient has been re-examined  -No change in the plan of care  Cammie Sickle, MD

## 2013-11-05 ENCOUNTER — Ambulatory Visit (HOSPITAL_BASED_OUTPATIENT_CLINIC_OR_DEPARTMENT_OTHER): Payer: Medicare Other | Admitting: Anesthesiology

## 2013-11-05 ENCOUNTER — Encounter (HOSPITAL_BASED_OUTPATIENT_CLINIC_OR_DEPARTMENT_OTHER): Payer: Medicare Other | Admitting: Anesthesiology

## 2013-11-05 ENCOUNTER — Ambulatory Visit (HOSPITAL_BASED_OUTPATIENT_CLINIC_OR_DEPARTMENT_OTHER)
Admission: RE | Admit: 2013-11-05 | Discharge: 2013-11-05 | Disposition: A | Discharge status: Home / Self Care | Payer: Medicare Other | Source: Ambulatory Visit | Attending: Orthopedic Surgery | Admitting: Orthopedic Surgery

## 2013-11-05 ENCOUNTER — Encounter (HOSPITAL_BASED_OUTPATIENT_CLINIC_OR_DEPARTMENT_OTHER): Payer: Self-pay | Admitting: *Deleted

## 2013-11-05 ENCOUNTER — Encounter (HOSPITAL_BASED_OUTPATIENT_CLINIC_OR_DEPARTMENT_OTHER)
Admission: RE | Disposition: A | Discharge status: Home / Self Care | Payer: Self-pay | Source: Ambulatory Visit | Attending: Orthopedic Surgery

## 2013-11-05 DIAGNOSIS — S62309A Unspecified fracture of unspecified metacarpal bone, initial encounter for closed fracture: Secondary | ICD-10-CM | POA: Insufficient documentation

## 2013-11-05 DIAGNOSIS — W010XXA Fall on same level from slipping, tripping and stumbling without subsequent striking against object, initial encounter: Secondary | ICD-10-CM | POA: Insufficient documentation

## 2013-11-05 DIAGNOSIS — K219 Gastro-esophageal reflux disease without esophagitis: Secondary | ICD-10-CM | POA: Insufficient documentation

## 2013-11-05 DIAGNOSIS — I498 Other specified cardiac arrhythmias: Secondary | ICD-10-CM | POA: Insufficient documentation

## 2013-11-05 DIAGNOSIS — Y92009 Unspecified place in unspecified non-institutional (private) residence as the place of occurrence of the external cause: Secondary | ICD-10-CM | POA: Insufficient documentation

## 2013-11-05 DIAGNOSIS — I447 Left bundle-branch block, unspecified: Secondary | ICD-10-CM | POA: Insufficient documentation

## 2013-11-05 HISTORY — PX: OPEN REDUCTION INTERNAL FIXATION (ORIF) METACARPAL: SHX6234

## 2013-11-05 HISTORY — DX: Left bundle-branch block, unspecified: I44.7

## 2013-11-05 HISTORY — DX: Presence of spectacles and contact lenses: Z97.3

## 2013-11-05 SURGERY — OPEN REDUCTION INTERNAL FIXATION (ORIF) METACARPAL
Anesthesia: Monitor Anesthesia Care | Site: Finger | Laterality: Left

## 2013-11-05 MED ORDER — ONDANSETRON HCL 4 MG/2ML IJ SOLN: 4.0000 mg | Freq: Four times a day (QID) | INTRAMUSCULAR | Status: DC | PRN

## 2013-11-05 MED ORDER — OXYCODONE HCL 5 MG/5ML PO SOLN: 5.0000 mg | Freq: Once | ORAL | Status: DC | PRN

## 2013-11-05 MED ORDER — MIDAZOLAM HCL 2 MG/2ML IJ SOLN: 1.0000 mg | INTRAMUSCULAR | Status: DC | PRN

## 2013-11-05 MED ORDER — FENTANYL CITRATE 0.05 MG/ML IJ SOLN
50.0000 ug | INTRAMUSCULAR | Status: DC | PRN
  Administered 2013-11-05: 100 ug via INTRAVENOUS

## 2013-11-05 MED ORDER — OXYCODONE HCL 5 MG PO TABS: 5.0000 mg | ORAL_TABLET | Freq: Once | ORAL | Status: DC | PRN

## 2013-11-05 MED ORDER — FENTANYL CITRATE 0.05 MG/ML IJ SOLN
INTRAMUSCULAR | Status: AC
  Filled 2013-11-05: qty 6

## 2013-11-05 MED ORDER — MIDAZOLAM HCL 2 MG/2ML IJ SOLN
INTRAMUSCULAR | Status: AC
  Filled 2013-11-05: qty 2

## 2013-11-05 MED ORDER — PROPOFOL INFUSION 10 MG/ML OPTIME
INTRAVENOUS | Status: DC | PRN
  Administered 2013-11-05: 50 ug/kg/min via INTRAVENOUS

## 2013-11-05 MED ORDER — FENTANYL CITRATE 0.05 MG/ML IJ SOLN: 25.0000 ug | INTRAMUSCULAR | Status: DC | PRN

## 2013-11-05 MED ORDER — LACTATED RINGERS IV SOLN
INTRAVENOUS | Status: DC
  Administered 2013-11-05: 07:00:00 via INTRAVENOUS

## 2013-11-05 MED ORDER — LIDOCAINE HCL (CARDIAC) 20 MG/ML IV SOLN
INTRAVENOUS | Status: DC | PRN
  Administered 2013-11-05: 20 mg via INTRAVENOUS

## 2013-11-05 MED ORDER — CEFAZOLIN SODIUM-DEXTROSE 2-3 GM-% IV SOLR
2.0000 g | Freq: Once | INTRAVENOUS | Status: AC
  Administered 2013-11-05: 2 g via INTRAVENOUS

## 2013-11-05 MED ORDER — OXYCODONE-ACETAMINOPHEN 5-325 MG PO TABS: ORAL_TABLET | ORAL | Status: DC

## 2013-11-05 MED ORDER — CHLORHEXIDINE GLUCONATE 4 % EX LIQD
60.0000 mL | Freq: Once | CUTANEOUS | Status: AC
  Administered 2013-11-05: 4 via TOPICAL

## 2013-11-05 MED ORDER — FENTANYL CITRATE 0.05 MG/ML IJ SOLN
INTRAMUSCULAR | Status: AC
  Filled 2013-11-05: qty 2

## 2013-11-05 MED ORDER — CEFAZOLIN SODIUM-DEXTROSE 2-3 GM-% IV SOLR
INTRAVENOUS | Status: AC
  Filled 2013-11-05: qty 50

## 2013-11-05 MED ORDER — ROPIVACAINE HCL 5 MG/ML IJ SOLN
INTRAMUSCULAR | Status: DC | PRN
  Administered 2013-11-05: 25 mL via PERINEURAL

## 2013-11-05 MED ORDER — LIDOCAINE HCL 2 % IJ SOLN
INTRAMUSCULAR | Status: DC | PRN
  Administered 2013-11-05: 10 mL

## 2013-11-05 SURGICAL SUPPLY — 71 items
BANDAGE ADH SHEER 1  50/CT (GAUZE/BANDAGES/DRESSINGS) IMPLANT
BANDAGE COBAN STERILE 2 (GAUZE/BANDAGES/DRESSINGS) IMPLANT
BANDAGE ELASTIC 3 VELCRO ST LF (GAUZE/BANDAGES/DRESSINGS) IMPLANT
BANDAGE ELASTIC 4 VELCRO ST LF (GAUZE/BANDAGES/DRESSINGS) IMPLANT
BLADE MINI RND TIP GREEN BEAV (BLADE) IMPLANT
BLADE SURG 15 STRL LF DISP TIS (BLADE) ×1 IMPLANT
BLADE SURG 15 STRL SS (BLADE) ×2
BNDG COHESIVE 1X5 TAN STRL LF (GAUZE/BANDAGES/DRESSINGS) IMPLANT
BNDG COHESIVE 3X5 TAN STRL LF (GAUZE/BANDAGES/DRESSINGS) ×3 IMPLANT
BNDG ELASTIC 2 VLCR STRL LF (GAUZE/BANDAGES/DRESSINGS) IMPLANT
BNDG ESMARK 4X9 LF (GAUZE/BANDAGES/DRESSINGS) ×3 IMPLANT
BNDG GAUZE ELAST 4 BULKY (GAUZE/BANDAGES/DRESSINGS) ×3 IMPLANT
BRUSH SCRUB EZ PLAIN DRY (MISCELLANEOUS) ×3 IMPLANT
CANISTER SUCT 1200ML W/VALVE (MISCELLANEOUS) ×3 IMPLANT
CLOSURE WOUND 1/2 X4 (GAUZE/BANDAGES/DRESSINGS)
CORDS BIPOLAR (ELECTRODE) ×6 IMPLANT
COVER MAYO STAND STRL (DRAPES) ×3 IMPLANT
COVER TABLE BACK 60X90 (DRAPES) ×3 IMPLANT
CUFF TOURNIQUET SINGLE 18IN (TOURNIQUET CUFF) IMPLANT
DECANTER SPIKE VIAL GLASS SM (MISCELLANEOUS) IMPLANT
DRAPE EXTREMITY T 121X128X90 (DRAPE) ×3 IMPLANT
DRAPE OEC MINIVIEW 54X84 (DRAPES) ×3 IMPLANT
DRAPE SURG 17X23 STRL (DRAPES) ×3 IMPLANT
GAUZE XEROFORM 1X8 LF (GAUZE/BANDAGES/DRESSINGS) ×3 IMPLANT
GLOVE BIO SURGEON STRL SZ 6.5 (GLOVE) ×2 IMPLANT
GLOVE BIO SURGEONS STRL SZ 6.5 (GLOVE) ×1
GLOVE BIOGEL M STRL SZ7.5 (GLOVE) ×3 IMPLANT
GLOVE BIOGEL PI IND STRL 7.0 (GLOVE) ×1 IMPLANT
GLOVE BIOGEL PI IND STRL 7.5 (GLOVE) ×1 IMPLANT
GLOVE BIOGEL PI INDICATOR 7.0 (GLOVE) ×2
GLOVE BIOGEL PI INDICATOR 7.5 (GLOVE) ×2
GLOVE ECLIPSE 7.0 STRL STRAW (GLOVE) ×3 IMPLANT
GLOVE ORTHO TXT STRL SZ7.5 (GLOVE) ×3 IMPLANT
GOWN STRL REUS W/ TWL LRG LVL3 (GOWN DISPOSABLE) ×2 IMPLANT
GOWN STRL REUS W/TWL LRG LVL3 (GOWN DISPOSABLE) ×4
GOWN STRL REUS W/TWL XL LVL3 (GOWN DISPOSABLE) ×6 IMPLANT
K-WIRE .045X4 (WIRE) ×9 IMPLANT
NEEDLE 27GAX1X1/2 (NEEDLE) ×3 IMPLANT
NS IRRIG 1000ML POUR BTL (IV SOLUTION) ×3 IMPLANT
PACK BASIN DAY SURGERY FS (CUSTOM PROCEDURE TRAY) ×3 IMPLANT
PAD CAST 3X4 CTTN HI CHSV (CAST SUPPLIES) ×1 IMPLANT
PAD CAST 4YDX4 CTTN HI CHSV (CAST SUPPLIES) IMPLANT
PADDING CAST ABS 4INX4YD NS (CAST SUPPLIES) ×2
PADDING CAST ABS COTTON 4X4 ST (CAST SUPPLIES) ×1 IMPLANT
PADDING CAST COTTON 3X4 STRL (CAST SUPPLIES) ×2
PADDING CAST COTTON 4X4 STRL (CAST SUPPLIES)
PADDING UNDERCAST 2  STERILE (CAST SUPPLIES) IMPLANT
SLEEVE SCD COMPRESS KNEE MED (MISCELLANEOUS) IMPLANT
SPLINT PLASTER CAST XFAST 3X15 (CAST SUPPLIES) ×10 IMPLANT
SPLINT PLASTER XTRA FASTSET 3X (CAST SUPPLIES) ×20
SPONGE GAUZE 4X4 12PLY (GAUZE/BANDAGES/DRESSINGS) ×3 IMPLANT
STOCKINETTE 4X48 STRL (DRAPES) ×3 IMPLANT
STRIP CLOSURE SKIN 1/2X4 (GAUZE/BANDAGES/DRESSINGS) IMPLANT
SUCTION FRAZIER TIP 10 FR DISP (SUCTIONS) IMPLANT
SUT ETHILON 4 0 PS 2 18 (SUTURE) IMPLANT
SUT MERSILENE 4 0 P 3 (SUTURE) IMPLANT
SUT PROLENE 3 0 PS 2 (SUTURE) IMPLANT
SUT PROLENE 4 0 P 3 18 (SUTURE) IMPLANT
SUT STEEL 0 (SUTURE)
SUT STEEL 0 18XMFL TIE 17 (SUTURE) IMPLANT
SUT STEEL 2 (SUTURE) IMPLANT
SUT VIC AB 4-0 P-3 18XBRD (SUTURE) IMPLANT
SUT VIC AB 4-0 P3 18 (SUTURE)
SYR 3ML 23GX1 SAFETY (SYRINGE) IMPLANT
SYR BULB 3OZ (MISCELLANEOUS) ×3 IMPLANT
SYR CONTROL 10ML LL (SYRINGE) ×3 IMPLANT
TOWEL OR 17X24 6PK STRL BLUE (TOWEL DISPOSABLE) ×3 IMPLANT
TRAY DSU PREP LF (CUSTOM PROCEDURE TRAY) ×3 IMPLANT
TUBE CONNECTING 20'X1/4 (TUBING)
TUBE CONNECTING 20X1/4 (TUBING) IMPLANT
UNDERPAD 30X30 INCONTINENT (UNDERPADS AND DIAPERS) ×3 IMPLANT

## 2013-11-05 NOTE — Transfer of Care (Signed)
Immediate Anesthesia Transfer of Care Note  Patient: Yvonne [name]  Procedure(s) Performed: Procedure(s): CLOSED REDUCTION/PINNING VS OPEN REDUCTION INTERNAL FIXATION (ORIF) LEFT SMALL  METACARPAL FRACTURE  (Left)  Patient Location: PACU  Anesthesia Type:MAC combined with regional for post-op pain  Level of Consciousness: awake, alert  and oriented  Airway & Oxygen Therapy: Patient Spontanous Breathing and Patient connected to face mask oxygen  Post-op Assessment: Report given to PACU RN, Post -op Vital signs reviewed and stable and Patient moving all extremities  Post vital signs: Reviewed and stable  Complications: No apparent anesthesia complications

## 2013-11-05 NOTE — Discharge Instructions (Addendum)
°  Post Anesthesia Home Care Instructions ° °Activity: °Get plenty of rest for the remainder of the day. A responsible adult should stay with you for 24 hours following the procedure.  °For the next 24 hours, DO NOT: °-Drive a car °-Operate machinery °-Drink alcoholic beverages °-Take any medication unless instructed by your physician °-Make any legal decisions or sign important papers. ° °Meals: °Start with liquid foods such as gelatin or soup. Progress to regular foods as tolerated. Avoid greasy, spicy, heavy foods. If nausea and/or vomiting occur, drink only clear liquids until the nausea and/or vomiting subsides. Call your physician if vomiting continues. ° °Special Instructions/Symptoms: °Your throat may feel dry or sore from the anesthesia or the breathing tube placed in your throat during surgery. If this causes discomfort, gargle with warm salt water. The discomfort should disappear within 24 hours. ° ° ° ° °Regional Anesthesia Blocks ° °1. Numbness or the inability to move the "blocked" extremity may last from 3-48 hours after placement. The length of time depends on the medication injected and your individual response to the medication. If the numbness is not going away after 48 hours, call your surgeon. ° °2. The extremity that is blocked will need to be protected until the numbness is gone and the  Strength has returned. Because you cannot feel it, you will need to take extra care to avoid injury. Because it may be weak, you may have difficulty moving it or using it. You may not know what position it is in without looking at it while the block is in effect. ° °3. For blocks in the legs and feet, returning to weight bearing and walking needs to be done carefully. You will need to wait until the numbness is entirely gone and the strength has returned. You should be able to move your leg and foot normally before you try and bear weight or walk. You will need someone to be with you when you first try to  ensure you do not fall and possibly risk injury. ° °4. Bruising and tenderness at the needle site are common side effects and will resolve in a few days. ° °5. Persistent numbness or new problems with movement should be communicated to the surgeon or the [facility] ([phone])/ [facility] ([phone]). ° ° ° ° °Hand Center Instructions °Hand Surgery ° °Wound Care: °Keep your hand elevated above the level of your heart.  Do not allow it to dangle by your side.  Keep the dressing dry and do not remove it unless your doctor advises you to do so.  He will usually change it at the time of your post-op visit.  Moving your fingers is advised to stimulate circulation but will depend on the site of your surgery.  If you have a splint applied, your doctor will advise you regarding movement. ° °Activity: °Do not drive or operate machinery today.  Rest today and then you may return to your normal activity and work as indicated by your physician. ° °Diet:  °Drink liquids today or eat a light diet.  You may resume a regular diet tomorrow.   ° °General expectations: °Pain for two to three days. °Fingers may become slightly swollen. ° °Call your doctor if any of the following occur: °Severe pain not relieved by pain medication. °Elevated temperature. °Dressing soaked with blood. °Inability to move fingers. °White or bluish color to fingers. °

## 2013-11-05 NOTE — Anesthesia Procedure Notes (Addendum)
Anesthesia Regional Block:  Supraclavicular block  Pre-Anesthetic Checklist: ,, timeout performed, Correct Patient, Correct Site, Correct Laterality, Correct Procedure, Correct Position, site marked, Risks and benefits discussed,  Surgical consent,  Pre-op evaluation,  At surgeon's request and post-op pain management  Laterality: Left  Prep: chloraprep       Needles:  Injection technique: Single-shot  Needle Type: Echogenic Stimulator Needle     Needle Length: 5cm 5 cm Needle Gauge: 22 and 22 G    Additional Needles:  Procedures: ultrasound guided (picture in chart) and nerve stimulator Supraclavicular block  Nerve Stimulator or Paresthesia:  Response: biceps flexion, 0.45 mA,   Additional Responses:   Narrative:  Start time: 11/05/2013 7:53 AM End time: 11/05/2013 8:08 AM Injection made incrementally with aspirations every 5 mL.  Performed by: Personally  Anesthesiologist: Dr Marcie Bal  Additional Notes: Functioning IV was confirmed and monitors were applied.  A 37mm 22ga Arrow echogenic stimulator needle was used. Sterile prep and drape,hand hygiene and sterile gloves were used.  Negative aspiration and negative test dose prior to incremental administration of local anesthetic. The patient tolerated the procedure well.  Ultrasound guidance: relevent anatomy identified, needle position confirmed, local anesthetic spread visualized around nerve(s), vascular puncture avoided.  Image printed for medical record.    Procedure Name: MAC Date/Time: 11/05/2013 8:42 AM Performed by: Cammie Sickle Pre-anesthesia Checklist: Patient identified, Emergency Drugs available, Suction available and Patient being monitored Patient Re-evaluated:Patient Re-evaluated prior to inductionOxygen Delivery Method: Simple face mask Preoxygenation: Pre-oxygenation with 100% oxygen Dental Injury: Teeth and Oropharynx as per pre-operative assessment

## 2013-11-05 NOTE — Brief Op Note (Signed)
11/05/2013  9:08 AM  PATIENT:  Yvonne [name]  80 y.o. female  PRE-OPERATIVE DIAGNOSIS:  LEFT SMALL METACARPAL UNSTABLE FRACTURE   POST-OPERATIVE DIAGNOSIS:  Left Small Metacarpal Unstable Fracture  PROCEDURE:  Procedure(s): CLOSED REDUCTION/PINNING VS OPEN REDUCTION INTERNAL FIXATION (ORIF) LEFT SMALL  METACARPAL FRACTURE  (Left)  SURGEON:  Surgeon(s) and Role:    * Cammie Sickle., MD - Primary  PHYSICIAN ASSISTANT:   ASSISTANTS: Kathyrn Sheriff.A-C    ANESTHESIA:   regional  EBL:  Total I/O In: 400 [I.V.:400] Out: -   BLOOD ADMINISTERED:none  DRAINS: none   LOCAL MEDICATIONS USED:  XYLOCAINE   SPECIMEN:  No Specimen  DISPOSITION OF SPECIMEN:  N/A  COUNTS:  YES  TOURNIQUET:    DICTATION: .Other Dictation: Dictation Number [id number]  PLAN OF CARE: Discharge to home after PACU  PATIENT DISPOSITION:  PACU - hemodynamically stable.   Delay start of Pharmacological VTE agent (>24hrs) due to surgical blood loss or risk of bleeding: not applicable

## 2013-11-05 NOTE — Anesthesia Postprocedure Evaluation (Signed)
Anesthesia Post Note  Patient: Yvonne [name]  Procedure(s) Performed: Procedure(s) (LRB): CLOSED REDUCTION/PINNING VS OPEN REDUCTION INTERNAL FIXATION (ORIF) LEFT SMALL  METACARPAL FRACTURE  (Left)  Anesthesia type: MAC and block  Patient location: PACU  Post pain: Pain level controlled and Adequate analgesia  Post assessment: Post-op Vital signs reviewed, Patient's Cardiovascular Status Stable and Respiratory Function Stable  Last Vitals:  Filed Vitals:   11/05/13 1012  BP:   Pulse: 55  Temp: 36.4 C  Resp: 18    Post vital signs: Reviewed and stable  Level of consciousness: awake, alert  and oriented  Complications: No apparent anesthesia complications

## 2013-11-05 NOTE — Progress Notes (Signed)
Assisted Dr. Hodierne with left, ultrasound guided, supraclavicular block. Side rails up, monitors on throughout procedure. See vital signs in flow sheet. Tolerated Procedure well. 

## 2013-11-05 NOTE — Anesthesia Preprocedure Evaluation (Signed)
Anesthesia Evaluation  Patient identified by MRN, date of birth, ID band Patient awake    Reviewed: Allergy & Precautions, H&P , NPO status , Patient's Chart, lab work & pertinent test results  Airway Mallampati: II  Neck ROM: full    Dental   Pulmonary          Cardiovascular + dysrhythmias Supra Ventricular Tachycardia + Valvular Problems/Murmurs  Mild MR.  Mild AI.   Neuro/Psych Anxiety    GI/Hepatic GERD-  ,  Endo/Other    Renal/GU      Musculoskeletal   Abdominal   Peds  Hematology   Anesthesia Other Findings   Reproductive/Obstetrics                           Anesthesia Physical Anesthesia Plan  ASA: II  Anesthesia Plan: Regional and MAC   Post-op Pain Management: MAC Combined w/ Regional for Post-op pain   Induction: Intravenous  Airway Management Planned: Simple Face Mask  Additional Equipment:   Intra-op Plan:   Post-operative Plan:   Informed Consent: I have reviewed the patients History and Physical, chart, labs and discussed the procedure including the risks, benefits and alternatives for the proposed anesthesia with the patient or authorized representative who has indicated his/her understanding and acceptance.     Plan Discussed with: CRNA, Anesthesiologist and Surgeon  Anesthesia Plan Comments:         Anesthesia Quick Evaluation

## 2013-11-05 NOTE — Op Note (Signed)
..  0                            0.000.

## 2013-11-06 NOTE — Op Note (Deleted)
[name], [name] NO.:  [id number]  MEDICAL RECORD NO.:  [id number]  LOCATION:                               FACILITY:  [facility]  PHYSICIAN:  Youlanda Mighty. Eugenie Harewood, M.D. DATE OF BIRTH:  1929/06/15  DATE OF PROCEDURE:  11/05/2013 DATE OF DISCHARGE:  11/05/2013                              OPERATIVE REPORT   PREOPERATIVE DIAGNOSES:  Status post closed fracture left small finger, metacarpal distal metaphysis and periarticular at MP joint, with apex dorsal angulation and malrotation.  POSTOPERATIVE DIAGNOSES:  Status post closed fracture left small finger, metacarpal distal metaphysis and periarticular at MP joint, with apex dorsal angulation and malrotation.  OPERATION:  Delayed closed reduction and percutaneous Kirschner wire fixation of left small finger, angulated metacarpal fracture.  OPERATING SURGEON:  Lorelei Pont, MD.  ASSISTANT:  Marily Lente Dasnoit, PA.  ANESTHESIA:  Regional block, left upper extremity.  SUPERVISING ANESTHESIOLOGIST:  Dr. Marcie Bal.  INDICATIONS:  Yvonne [name] is an 80 year old woman well acquainted with our practice.  On Christmas Day of 2014, she had a fall at home sustaining a significant injury to her left hand.  She was seen at the [facility] where Dr. Velda Shell, and experienced emergency room physician examined her hand and noted swelling and deformity.  X-rays were obtained, which revealed an apex dorsal angulated fracture of her left small finger metacarpal.  He properly splinted Yvonne [name] and referred her for hand surgery followup.  As she was an established patient with myself, she requested a followup. On examination in the office, she was noted to have an apex dorsal spiral oblique fracture of the distal metaphysis that was periarticular. She has significant osteopenia.  We advised her that this fracture was at high risk for angulation.  She had a number of other medical dates on her counter during  this period of time including scheduled elective cataract surgery.  We advised her to proceed with closed reduction percutaneous pinning versus plating of her fracture.  She arranged to schedule to undergo evaluation under anesthesia at this time, with one of the aforementioned procedures.  PROCEDURE IN DETAIL:  After informed consent, she is brought to the operating room at this time.  Preoperatively, she was interviewed by Dr. Marcie Bal of Anesthesia. General anesthesia by LMA technique was rejected and a regional block recommended and accepted.  After Dr. Marcie Bal placed a block with ultrasound guidance in the holding area, excellent anesthesia of the arm was achieved; however, there is some residual sensibility in the ulnar distribution of the hand, therefore I placed a 2% ulnar nerve block at the wrist, both of the dorsal sensory branch and the ulnar nerve proper utilizing 2% lidocaine.  In room 1 of the [facility] under Dr. Trude Mcburney direct supervision, IV sedation was provided followed by routine Betadine scrub and paint of the left upper extremity.  A pneumatic tourniquet was applied, but not employed.  A C-arm fluoroscope was utilized to examine the fracture.  It was angulated, shortened and slightly malrotated.  We noted that despite it being 15 days post injury, the fracture still easily manipulated.  I performed a closed reduction maneuver  of traction, derotation and three-point molding which corrected the fracture anatomically.  We then placed two 0.045-inch Kirschner wires across the fracture into the ring finger metacarpal to control length, rotation, and subsidence. Satisfactory C-arm images were obtained.  A stable construct was achieved.  The pins were trimmed and bent in the usual manner followed by dressing with Xeroflo.  Yvonne [name] was placed in a safe position splint immobilizing the wrist, ring, and small fingers.  There were no apparent  complications.  For aftercare, she is provided a prescription for Percocet 5 mg 1 p.o. q.4-6 h. p.r.n. pain, 20 tablets without refill.     [name], M.D.     RVS/MEDQ  D:  11/05/2013  T:  11/05/2013  Job:  [id number]  cc:   Corky Downs, M.D.

## 2013-11-06 NOTE — Op Note (Signed)
[name], [name] NO.:  [id number]  MEDICAL RECORD NO.:  [id number]  LOCATION:                               FACILITY:  [facility]  PHYSICIAN:  Youlanda Mighty. Emmagrace Runkel, M.D. DATE OF BIRTH:  1929/06/15  DATE OF PROCEDURE:  11/05/2013 DATE OF DISCHARGE:  11/05/2013                              OPERATIVE REPORT   PREOPERATIVE DIAGNOSES:  Status post closed fracture left small finger, metacarpal distal metaphysis and periarticular at MP joint, with apex dorsal angulation and malrotation.  POSTOPERATIVE DIAGNOSES:  Status post closed fracture left small finger, metacarpal distal metaphysis and periarticular at MP joint, with apex dorsal angulation and malrotation.  OPERATION:  Delayed closed reduction and percutaneous Kirschner wire fixation of left small finger, angulated metacarpal fracture.  OPERATING SURGEON:  Lorelei Pont, MD.  ASSISTANT:  Marily Lente Dasnoit, PA.  ANESTHESIA:  Regional block, left upper extremity.  SUPERVISING ANESTHESIOLOGIST:  Dr. Marcie Bal.  INDICATIONS:  Yvonne [name] is an 80 year old woman well acquainted with our practice.  On Christmas Day of 2014, she had a fall at home sustaining a significant injury to her left hand.  She was seen at the [facility] where Dr. Velda Shell, and experienced emergency room physician examined her hand and noted swelling and deformity.  X-rays were obtained, which revealed an apex dorsal angulated fracture of her left small finger metacarpal.  He properly splinted Ms. Stang and referred her for hand surgery followup.  As she was an established patient with myself, she requested a followup. On examination in the office, she was noted to have an apex dorsal spiral oblique fracture of the distal metaphysis that was periarticular. She has significant osteopenia.  We advised her that this fracture was at high risk for angulation.  She had a number of other medical dates on her counter during  this period of time including scheduled elective cataract surgery.  We advised her to proceed with closed reduction percutaneous pinning versus plating of her fracture.  She arranged to schedule to undergo evaluation under anesthesia at this time, with one of the aforementioned procedures.  PROCEDURE IN DETAIL:  After informed consent, she is brought to the operating room at this time.  Preoperatively, she was interviewed by Dr. Marcie Bal of Anesthesia. General anesthesia by LMA technique was rejected and a regional block recommended and accepted.  After Dr. Marcie Bal placed a block with ultrasound guidance in the holding area, excellent anesthesia of the arm was achieved; however, there is some residual sensibility in the ulnar distribution of the hand, therefore I placed a 2% ulnar nerve block at the wrist, both of the dorsal sensory branch and the ulnar nerve proper utilizing 2% lidocaine.  In room 1 of the [facility] under Dr. Trude Mcburney direct supervision, IV sedation was provided followed by routine Betadine scrub and paint of the left upper extremity.  A pneumatic tourniquet was applied, but not employed.  A C-arm fluoroscope was utilized to examine the fracture.  It was angulated, shortened and slightly malrotated.  We noted that despite it being 15 days post injury, the fracture still easily manipulated.  I performed a closed reduction maneuver  of traction, derotation and three-point molding which corrected the fracture anatomically.  We then placed two 0.045-inch Kirschner wires across the fracture into the ring finger metacarpal to control length, rotation, and subsidence. Satisfactory C-arm images were obtained.  A stable construct was achieved.  The pins were trimmed and bent in the usual manner followed by dressing with Xeroflo.  Ms. Lahue was placed in a safe position splint immobilizing the wrist, ring, and small fingers.  There were no apparent  complications.  For aftercare, she is provided a prescription for Percocet 5 mg 1 p.o. q.4-6 h. p.r.n. pain, 20 tablets without refill.     Alba Kriesel V. Broughton Eppinger, M.D.     RVS/MEDQ  D:  11/05/2013  T:  11/05/2013  Job:  [id number]  cc:   Houston M. Kimbrough, M.D. 

## 2013-11-08 ENCOUNTER — Encounter (HOSPITAL_BASED_OUTPATIENT_CLINIC_OR_DEPARTMENT_OTHER): Payer: Self-pay | Admitting: Orthopedic Surgery

## 2014-01-17 ENCOUNTER — Ambulatory Visit: Payer: Medicare Other | Admitting: Internal Medicine

## 2014-01-20 ENCOUNTER — Other Ambulatory Visit: Payer: Self-pay | Admitting: Internal Medicine

## 2014-01-21 NOTE — Telephone Encounter (Signed)
Should this be sent in as bid? Per 10/11/13 phone note it looks like she was ok'd to take bid. Please advise. Thanks, MI

## 2014-01-24 ENCOUNTER — Other Ambulatory Visit: Payer: Self-pay | Admitting: *Deleted

## 2014-01-24 MED ORDER — PROPRANOLOL HCL ER 80 MG PO CP24: 80.0000 mg | ORAL_CAPSULE | Freq: Every day | ORAL | Status: DC

## 2014-01-24 NOTE — Telephone Encounter (Signed)
Spoke to patient and she stated that she takes inderal once daily.

## 2014-01-26 NOTE — Telephone Encounter (Signed)
Mindy confirmed w/ pt Monday that she takes Inderal once daily. (She was ok'd to take BID in December, but she is to notify us if she increases to BID). Prescription for once daily was sent 3/30

## 2014-01-31 ENCOUNTER — Ambulatory Visit (INDEPENDENT_AMBULATORY_CARE_PROVIDER_SITE_OTHER): Payer: Medicare Other | Admitting: Internal Medicine

## 2014-01-31 ENCOUNTER — Encounter: Payer: Self-pay | Admitting: Internal Medicine

## 2014-01-31 VITALS — BP 111/66 | HR 67 | Ht 66.0 in | Wt 140.0 lb

## 2014-01-31 DIAGNOSIS — I471 Supraventricular tachycardia: Secondary | ICD-10-CM

## 2014-01-31 MED ORDER — PROPRANOLOL HCL ER 120 MG PO CP24: 120.0000 mg | ORAL_CAPSULE | Freq: Every day | ORAL | Status: DC

## 2014-01-31 NOTE — Patient Instructions (Signed)
Your physician has recommended you make the following change in your medication: Increasing Propranolol (Inderal) to 120 mg daily.   Your physician recommends that you schedule a follow-up appointment in: 3 months with Dr. Caryl Comes

## 2014-01-31 NOTE — Progress Notes (Signed)
Patient Care Team: Lajean Manes, MD as PCP - General (Internal Medicine)   HPI  Yvonne [name] is a 80 y.o. female Seen in followup for SVT for which she has elected  medical therapy. Echocardiogram 2010 demonstrated mild left ventricular dysfunction. She is chronic left bundle branch block.  At our last visit have her prescriptions for atenolol, Inderal and metoprolol succinate to see if she could tolerate any of these better as opposed and calcium blockers which she tolerated poorly.  At this point she is having symptomatic tachypalpitations. She think she is tolerating Inderal LA quite well. Her husband identifies more neck pulsations than she feels.  Past Medical History  Diagnosis Date  . Hemorrhage of gastrointestinal tract, unspecified   . Anxiety state, unspecified   . Left ventricular systolic dysfunction     hx of mild  . History of peptic ulcer   . Esophageal reflux   . Mitral valve insufficiency and aortic valve insufficiency   . Palpitations     history  . Other primary cardiomyopathies   . Wears glasses   . Other left bundle branch block   . Paroxysmal supraventricular tachycardia   . LBBB (left bundle branch block)     Past Surgical History  Procedure Laterality Date  . Esophagogastroduodenoscopy    . Tonsillectomy    . Appendectomy    . Abdominal hysterectomy    . Tubal ligation    . Knee arthroscopy  2009    left  . Breast surgery      biopsy  . Open reduction internal fixation (orif) metacarpal Left 11/05/2013    Procedure: CLOSED REDUCTION/PINNING VS OPEN REDUCTION INTERNAL FIXATION (ORIF) LEFT SMALL  METACARPAL FRACTURE ;  Surgeon: Cammie Sickle., MD;  Location: [facility];  Service: Orthopedics;  Laterality: Left;    Current Outpatient Prescriptions  Medication Sig Dispense Refill  . aspirin EC 81 MG tablet Take 1 tablet (81 mg total) by mouth daily.      . Calcium-Magnesium-Zinc (CAL-MAG-ZINC PO) Take by mouth  daily.      . clorazepate (TRANXENE) 7.5 MG tablet Take 7.5 mg by mouth 2 (two) times daily as needed.      Marland Kitchen estradiol (ESTRACE) 1 MG tablet Take 0.5 mg by mouth daily.      . Multiple Vitamins-Minerals (CENTRUM SILVER PO) Take by mouth daily.      Marland Kitchen oxyCODONE-acetaminophen (PERCOCET/ROXICET) 5-325 MG per tablet Take one tablet every 4 to 6 hours as needed for pain  30 tablet  0  . propranolol ER (INDERAL LA) 80 MG 24 hr capsule Take 1 capsule (80 mg total) by mouth daily.  30 capsule  0  . sertraline (ZOLOFT) 25 MG tablet daily.      . traMADol (ULTRAM) 50 MG tablet Take 50 mg by mouth every 6 (six) hours as needed.      . vitamin C (ASCORBIC ACID) 500 MG tablet Take 500 mg by mouth daily.      Marland Kitchen VITAMIN E PO Take by mouth.       No current facility-administered medications for this visit.    Allergies  Allergen Reactions  . Aleve [Naproxen Sodium]     Gi bleed  . Aspirin     In high doses    Review of Systems negative except from HPI and PMH  Physical Exam BP 111/66  Pulse 67  Ht 5\' 6"  (1.676 m)  Wt 140 lb (63.504 kg)  BMI 22.61 kg/m2 Well developed and well nourished in no acute distress HENT normal E scleral and icterus clear Neck Supple JVP flat; Clear to ausculation Regular rate and rhythm, no murmurs gallops or rub Soft with active bowel sounds No clubbing cyanosis  Edema Alert and oriented, grossly normal motor and sensory function Skin Warm and Dry    Assessment and  Plan  SVT  She is doing quite well. She has episodic palpitations. We'll increase her Inderal LA which he seems to be tolerating from 80--120. We'll see her in 3 months

## 2014-02-01 ENCOUNTER — Telehealth: Payer: Self-pay | Admitting: Internal Medicine

## 2014-02-01 NOTE — Telephone Encounter (Signed)
New problem     Pt needs a call back please she has some questions about her medications she forgot to ask about 4/6.

## 2014-02-02 NOTE — Telephone Encounter (Signed)
Pt just wanted to inform us that she would not start Inderal increase (ordered on 4/6) until after her eye surgery 4/14.

## 2014-03-24 ENCOUNTER — Other Ambulatory Visit: Payer: Self-pay | Admitting: Dermatology

## 2014-05-16 NOTE — Telephone Encounter (Signed)
Close Encounter 

## 2014-05-18 ENCOUNTER — Encounter: Payer: Self-pay | Admitting: Internal Medicine

## 2014-05-18 ENCOUNTER — Ambulatory Visit (INDEPENDENT_AMBULATORY_CARE_PROVIDER_SITE_OTHER): Payer: Medicare Other | Admitting: Internal Medicine

## 2014-05-18 VITALS — BP 136/78 | HR 57 | Ht 66.5 in | Wt 134.0 lb

## 2014-05-18 DIAGNOSIS — I471 Supraventricular tachycardia, unspecified: Secondary | ICD-10-CM

## 2014-05-18 MED ORDER — FLECAINIDE ACETATE 50 MG PO TABS: 50.0000 mg | ORAL_TABLET | Freq: Two times a day (BID) | ORAL | Status: DC

## 2014-05-18 NOTE — Progress Notes (Signed)
Patient Care Team: Lajean Manes, MD as PCP - General (Internal Medicine)   HPI  Yvonne [name] is a 80 y.o. female Seen in followup for SVT for which she has elected  medical therapy. Echocardiogram 2010 demonstrated mild left ventricular dysfunction. She is chronic left bundle branch block.  After trial of various beta blockers and calcium blockers, she has been taking Inderal LA.      Past Medical History  Diagnosis Date  . Hemorrhage of gastrointestinal tract, unspecified   . Anxiety state, unspecified   . Left ventricular systolic dysfunction     hx of mild  . History of peptic ulcer   . Esophageal reflux   . Mitral valve insufficiency and aortic valve insufficiency   . Palpitations     history  . Other primary cardiomyopathies   . Wears glasses   . Other left bundle branch block   . Paroxysmal supraventricular tachycardia   . LBBB (left bundle branch block)     Past Surgical History  Procedure Laterality Date  . Esophagogastroduodenoscopy    . Tonsillectomy    . Appendectomy    . Abdominal hysterectomy    . Tubal ligation    . Knee arthroscopy  2009    left  . Breast surgery      biopsy  . Open reduction internal fixation (orif) metacarpal Left 11/05/2013    Procedure: CLOSED REDUCTION/PINNING VS OPEN REDUCTION INTERNAL FIXATION (ORIF) LEFT SMALL  METACARPAL FRACTURE ;  Surgeon: Cammie Sickle., MD;  Location: [facility];  Service: Orthopedics;  Laterality: Left;    Current Outpatient Prescriptions  Medication Sig Dispense Refill  . aspirin EC 81 MG tablet Take 1 tablet (81 mg total) by mouth daily.      . Calcium-Magnesium-Zinc (CAL-MAG-ZINC PO) Take by mouth daily.      . clorazepate (TRANXENE) 7.5 MG tablet Take 7.5 mg by mouth 2 (two) times daily as needed.      Marland Kitchen estradiol (ESTRACE) 1 MG tablet Take 0.5 mg by mouth daily.      . Multiple Vitamins-Minerals (CENTRUM SILVER PO) Take by mouth daily.      . propranolol ER  (INDERAL LA) 120 MG 24 hr capsule Take 1 capsule (120 mg total) by mouth daily.  30 capsule  4  . sertraline (ZOLOFT) 25 MG tablet daily.      . traMADol (ULTRAM) 50 MG tablet Take 50 mg by mouth every 6 (six) hours as needed.      . vitamin C (ASCORBIC ACID) 500 MG tablet Take 500 mg by mouth daily.       No current facility-administered medications for this visit.    Allergies  Allergen Reactions  . Aleve [Naproxen Sodium]     Gi bleed  . Aspirin     In high doses    Review of Systems negative except from HPI and PMH  Physical Exam BP 136/78  Pulse 57  Ht 5' 6.5" (1.689 m)  Wt 134 lb (60.782 kg)  BMI 21.31 kg/m2 Well developed and well nourished in no acute distress HENT normal E scleral and icterus clear Neck Supple JVP flat; Clear to ausculation Regular rate and rhythm, no murmurs gallops or rub Soft with active bowel sounds No clubbing cyanosis  Edema Alert and oriented, grossly normal motor and sensory function Skin Warm and Dry    Assessment and  Plan  SVT  She is doing quite well. She has episodic palpitations.  We discussed treatment options. We will begin her on flecainide 50 mg twice daily. She'll let us know how she does. She was interested to be a little bit more and catheter ablation. She has struggles with going to the hospital. She struggled with depression.

## 2014-05-18 NOTE — Patient Instructions (Signed)
Your physician has recommended you make the following change in your medication:  1) START Flecainide 50 mg twice daily  Please call us in 2 weeks to let us know how you are doing -  [phone]  Your physician wants you to follow-up in: 6 months with Dr. Caryl Comes. You will receive a reminder letter in the mail two months in advance. If you don't receive a letter, please call our office to schedule the follow-up appointment.  The procedure you and Dr. Caryl Comes discussed is catheter ablation.

## 2014-06-01 ENCOUNTER — Other Ambulatory Visit: Payer: Self-pay | Admitting: *Deleted

## 2014-06-01 ENCOUNTER — Telehealth: Payer: Self-pay | Admitting: Internal Medicine

## 2014-06-01 NOTE — Telephone Encounter (Signed)
Patient c/o of chest pain on left side since starting Flecainide. She describes it as a short pain, no more than a couple minutes. She states she did not have this before starting medication. Heart still racing every so often. Pain occurs on a daily basis most of the time. Will review with Dr. Caryl Comes and get back with her.

## 2014-06-01 NOTE — Telephone Encounter (Signed)
New message     Talk to the nurse regarding her medication

## 2014-06-02 NOTE — Telephone Encounter (Signed)
Advised patient to stop Flecainide (and start another med) - however, pt states that she hasn't been having as much pain the last couple of days and that she has tried different medications and none have really worked yet. She is hesitant about starting another medication and would like to remain on Flecainide for now. Advised to call us if pain returns or becomes intolerable again. She is much happier with this choice. Informed patient that Dr. Caryl Comes and I would be out of the office until 8/18 and if she changed her mind to call office and another nurse would be glad to help send in new medication for her. She is agreeable to this.  (If pt calls back and requests medication change then d/c Flecainide and start Propafenone 150 mg BID)

## 2014-07-06 ENCOUNTER — Telehealth: Payer: Self-pay | Admitting: Internal Medicine

## 2014-07-06 NOTE — Telephone Encounter (Signed)
Patient tells me that last Friday she had another event of rapid HR for 4 hours straight, then returned to normal. Next episode was Saturday night into Sunday evening. She is better now, but concerned because she hasn't had episodes like this in a long time and not enjoying them now. She would like to stop Flecainide and start Propafenone (refer to 8/5 telephone note). Advised rx will be sent tomorrow after confirming with Dr. Caryl Comes.  She is agreeable to this.

## 2014-07-06 NOTE — Telephone Encounter (Signed)
New message ° ° ° ° ° °Talk to the nurse regarding her medications °

## 2014-07-08 ENCOUNTER — Other Ambulatory Visit: Payer: Self-pay | Admitting: *Deleted

## 2014-07-08 MED ORDER — PROPAFENONE HCL 150 MG PO TABS: 150.0000 mg | ORAL_TABLET | Freq: Two times a day (BID) | ORAL | Status: DC

## 2014-07-08 NOTE — Telephone Encounter (Signed)
Left message informing patient to stop Flecainide and start Propafenone tomorrow. Rx sent to [facility].

## 2014-07-22 ENCOUNTER — Telehealth: Payer: Self-pay | Admitting: Internal Medicine

## 2014-07-22 DIAGNOSIS — I493 Ventricular premature depolarization: Secondary | ICD-10-CM

## 2014-07-22 DIAGNOSIS — R000 Tachycardia, unspecified: Secondary | ICD-10-CM

## 2014-07-22 NOTE — Telephone Encounter (Signed)
Patient tells me that since she started medication (propranolol) it has left a funny taste in her mouth, everything tastes metallic to her. Especially when she is drinking water. She would like Dr. Olin Pia advisement on how to handle this - she does not think she could continue this med forever if she can't eat/drink anything.  Will forward to Dr. Caryl Comes for recommendations. Pt is aware I will call her once he has responded.

## 2014-07-22 NOTE — Telephone Encounter (Signed)
New message      Pt want to talk to [facility] regarding medication

## 2014-08-08 NOTE — Telephone Encounter (Signed)
Also having some constipation with Propranolol.

## 2014-08-08 NOTE — Telephone Encounter (Signed)
Follow Up   Pt has some follow up questions regarding her medications. Please call.

## 2014-08-09 NOTE — Telephone Encounter (Signed)
Discussed with Dr. Caryl Comes previous beta blocker trial we did in December - he recommends we try Metoprolol succinate 50 mg daily (take 1/2 dose daily for 3/4 days, then increase to whole dose), if patient never started/tried this med. Pt is not sure if she had ever taken this medication and will contact drug store tomorrow and call me back with findings.

## 2014-08-09 NOTE — Telephone Encounter (Signed)
Advised patient that I would review this with Dr. Caryl Comes today and get back with her today/tomorrow. She is agreeable to this.

## 2014-08-10 NOTE — Telephone Encounter (Signed)
Patient tells me that she thinks she tried the Metoprolol back in December, and then switched in Inderal. She isn't sure. I called and spoke with pharmacist (per her ok) to clarify filling Metoprolol. While speaking with the pharmacist she informs me that this is related to the Propafenone, not Propranolol.  So I called patient back, who tells me she isn't taking Propranolol at all because she thought she was supposed to stop it at the last office visit. She seems slightly confused with this, possibly secondary to similarity of medication names. I explained that we never instructed her to stop Propranolol at the last visit, we started Flecainide, but she was to remain on Propranolol. She apologized for the confusion and I tried to reassure her that we will get this figured out and make sure she is taking the correct medications. Explained that I would discuss this with Dr. Caryl Comes tomorrow when he is back in the office. She is agreeable to this.

## 2014-08-10 NOTE — Telephone Encounter (Signed)
F/u   Pt was told to call nurse today, and want a call back. Please call.

## 2014-08-11 NOTE — Telephone Encounter (Signed)
Follow up     Pt is waiting for Sherri to call.  She is out of propafenone---it leaves a bad taste in her mouth.  Pt says she needs to run errands but do not want to miss your call.   Please call soon

## 2014-08-11 NOTE — Telephone Encounter (Signed)
Informed patient of Dr. Olin Pia recommendations (to stop propafenone or keep taking it and deal with the metallic taste).  She is uneasy about stopping medication and is going to think about this and will call back with final decision. She is going to continue propafenone for now. I will look into any remedies to help with the metallic taste and call her if I find any. She is agreeable to this and we will talk soon.

## 2014-08-16 ENCOUNTER — Encounter: Payer: Self-pay | Admitting: *Deleted

## 2014-08-16 NOTE — Addendum Note (Signed)
Addended by: Stanton Kidney on: 08/16/2014 05:03 PM   Modules accepted: Orders

## 2014-08-16 NOTE — Telephone Encounter (Signed)
Called patient to give her some remedy examples for things to help with the metallic taste.  During this conversation patient tells me that she ended up stopping Propafenone over the weekend but restarted it yesterday secondary to increased HR.  She states that this morning she is extremely weak and you can see her pulse in her neck (she has seen this before when HR is increased). Explained that I would review with Dr. Caryl Comes this afternoon and get back with her. We may need to restart beta blocker. She is agreeable with plan and will track HR over the next few hours. I will call her about lunch time to get HR's and then discuss with Dr. Caryl Comes.

## 2014-08-16 NOTE — Telephone Encounter (Signed)
This encounter was created in error - please disregard.

## 2014-08-16 NOTE — Telephone Encounter (Signed)
She tells me that the episodes have almost stopped now.  We will begin with a 48 hour holter monitor. Pt aware someone will be contacting her to arrange this for next week or so. (She will cancel  holter appointment if episodes subside) She is very grateful for my help with this and agreeable to plan.

## 2014-09-02 NOTE — Telephone Encounter (Signed)
Elliot Gault, RN           08-22-14 Ms. Dunson stated she doesn't need it at present and will call back to r/s)/will defer.

## 2014-10-12 ENCOUNTER — Other Ambulatory Visit: Payer: Self-pay | Admitting: Dermatology

## 2014-11-11 ENCOUNTER — Other Ambulatory Visit: Payer: Self-pay | Admitting: Internal Medicine

## 2014-11-18 ENCOUNTER — Ambulatory Visit: Payer: Medicare Other | Admitting: Internal Medicine

## 2014-11-29 ENCOUNTER — Other Ambulatory Visit: Payer: Self-pay | Admitting: Dermatology

## 2014-12-07 ENCOUNTER — Other Ambulatory Visit: Payer: Self-pay | Admitting: Internal Medicine

## 2015-01-03 ENCOUNTER — Other Ambulatory Visit: Payer: Self-pay | Admitting: Dermatology

## 2015-01-16 ENCOUNTER — Encounter: Payer: Self-pay | Admitting: *Deleted

## 2015-01-16 ENCOUNTER — Encounter: Payer: Self-pay | Admitting: Internal Medicine

## 2015-01-16 ENCOUNTER — Telehealth: Payer: Self-pay | Admitting: *Deleted

## 2015-01-16 ENCOUNTER — Ambulatory Visit (INDEPENDENT_AMBULATORY_CARE_PROVIDER_SITE_OTHER): Payer: Medicare Other | Admitting: Internal Medicine

## 2015-01-16 VITALS — BP 126/66 | HR 74 | Ht 66.5 in | Wt 132.6 lb

## 2015-01-16 DIAGNOSIS — R002 Palpitations: Secondary | ICD-10-CM

## 2015-01-16 DIAGNOSIS — I471 Supraventricular tachycardia, unspecified: Secondary | ICD-10-CM

## 2015-01-16 NOTE — Progress Notes (Signed)
Patient Care Team: Lajean Manes, MD as PCP - General (Internal Medicine)   HPI  Yvonne [name] is a 81 y.o. female Seen in followup for SVT for which she has elected  medical therapy. Echocardiogram 2010 demonstrated mild left ventricular dysfunction. She is chronic left bundle branch block.  After trial of various beta blockers and calcium blockers, she has been taking Inderal LA.  She is tolerating propafenone   QRS duration  none 150  Propafenone 150 168   I asked her where she met her husband, she said it was a secret and changed  he subject.   Past Medical History  Diagnosis Date  . Hemorrhage of gastrointestinal tract, unspecified   . Anxiety state, unspecified   . Left ventricular systolic dysfunction     hx of mild  . History of peptic ulcer   . Esophageal reflux   . Mitral valve insufficiency and aortic valve insufficiency   . Palpitations     history  . Other primary cardiomyopathies   . Wears glasses   . Other left bundle branch block   . Paroxysmal supraventricular tachycardia   . LBBB (left bundle branch block)     Past Surgical History  Procedure Laterality Date  . Esophagogastroduodenoscopy    . Tonsillectomy    . Appendectomy    . Abdominal hysterectomy    . Tubal ligation    . Knee arthroscopy  2009    left  . Breast surgery      biopsy  . Open reduction internal fixation (orif) metacarpal Left 11/05/2013    Procedure: CLOSED REDUCTION/PINNING VS OPEN REDUCTION INTERNAL FIXATION (ORIF) LEFT SMALL  METACARPAL FRACTURE ;  Surgeon: Cammie Sickle., MD;  Location: [facility];  Service: Orthopedics;  Laterality: Left;    Current Outpatient Prescriptions  Medication Sig Dispense Refill  . aspirin EC 81 MG tablet Take 1 tablet (81 mg total) by mouth daily.    . Calcium-Magnesium-Zinc (CAL-MAG-ZINC PO) Take by mouth daily.    . clorazepate (TRANXENE) 7.5 MG tablet Take 7.5 mg by mouth 2 (two) times daily as needed.    Marland Kitchen  estradiol (ESTRACE) 1 MG tablet Take 0.5 mg by mouth daily.    . Multiple Vitamins-Minerals (CENTRUM SILVER PO) Take by mouth daily.    . propafenone (RYTHMOL) 150 MG tablet TAKE 1 TABLET BY MOUTH TWICE A DAY. 60 tablet 1  . propranolol ER (INDERAL LA) 120 MG 24 hr capsule Take 1 capsule (120 mg total) by mouth daily. 30 capsule 4  . sertraline (ZOLOFT) 25 MG tablet daily.    . traMADol (ULTRAM) 50 MG tablet Take 50 mg by mouth every 6 (six) hours as needed for moderate pain or severe pain.     . vitamin C (ASCORBIC ACID) 500 MG tablet Take 500 mg by mouth daily.     No current facility-administered medications for this visit.    Allergies  Allergen Reactions  . Aleve [Naproxen Sodium]     Gi bleed  . Aspirin     In high doses    Review of Systems negative except from HPI and PMH  Physical Exam BP 126/66 mmHg  Pulse 74  Ht 5' 6.5" (1.689 m)  Wt 60.147 kg (132 lb 9.6 oz)  BMI 21.08 kg/m2 Well developed and well nourished in no acute distress HENT normal E scleral and icterus clear Neck Supple JVP flat; Clear to ausculation Regular rate and rhythm, no murmurs gallops  or rub Soft with active bowel sounds No clubbing cyanosis  Edema Alert and oriented, grossly normal motor and sensory function Skin Warm and Dry    Assessment and  Plan  SVT  Left bundle branch block  High risk medication surveillance   She is tolerating the propafenone extremely well. She is also tolerating his concomitant use with Inderal.  Left bundle branch block has not changed significantly in the last year; there's been about a 10% change. We will plan to see her again in 6 months time to reevaluate QRS duration

## 2015-01-16 NOTE — Telephone Encounter (Signed)
called to update med list and let us know that she is NOT talking the inderal that WAS on her med list, Per Dr. Caryl Comes she is NOT to take this medication and it was REMOVED from her list..

## 2015-01-16 NOTE — Patient Instructions (Signed)
Your physician recommends that you continue on your current medications as directed. Please refer to the Current Medication list given to you today. - Reminder: Call our office to update medication list if not taking Propranolol HCL  Your physician wants you to follow-up in: 12 months with Dr. Caryl Comes. You will receive a reminder letter in the mail two months in advance. If you don't receive a letter, please call our office to schedule the follow-up appointment.

## 2015-02-13 ENCOUNTER — Other Ambulatory Visit: Payer: Self-pay | Admitting: Internal Medicine

## 2015-04-06 ENCOUNTER — Emergency Department (HOSPITAL_COMMUNITY): Payer: Medicare Other

## 2015-04-06 ENCOUNTER — Emergency Department (HOSPITAL_COMMUNITY)
Admission: EM | Admit: 2015-04-06 | Discharge: 2015-04-06 | Disposition: A | Discharge status: Home / Self Care | Payer: Medicare Other | Attending: Emergency Medicine | Admitting: Emergency Medicine

## 2015-04-06 ENCOUNTER — Encounter (HOSPITAL_COMMUNITY): Payer: Self-pay | Admitting: Emergency Medicine

## 2015-04-06 DIAGNOSIS — F411 Generalized anxiety disorder: Secondary | ICD-10-CM | POA: Diagnosis not present

## 2015-04-06 DIAGNOSIS — Z8711 Personal history of peptic ulcer disease: Secondary | ICD-10-CM | POA: Insufficient documentation

## 2015-04-06 DIAGNOSIS — S0990XA Unspecified injury of head, initial encounter: Secondary | ICD-10-CM | POA: Diagnosis not present

## 2015-04-06 DIAGNOSIS — Z7982 Long term (current) use of aspirin: Secondary | ICD-10-CM | POA: Insufficient documentation

## 2015-04-06 DIAGNOSIS — W01198A Fall on same level from slipping, tripping and stumbling with subsequent striking against other object, initial encounter: Secondary | ICD-10-CM | POA: Diagnosis not present

## 2015-04-06 DIAGNOSIS — R52 Pain, unspecified: Secondary | ICD-10-CM

## 2015-04-06 DIAGNOSIS — Y92481 Parking lot as the place of occurrence of the external cause: Secondary | ICD-10-CM | POA: Insufficient documentation

## 2015-04-06 DIAGNOSIS — S4992XA Unspecified injury of left shoulder and upper arm, initial encounter: Secondary | ICD-10-CM | POA: Diagnosis present

## 2015-04-06 DIAGNOSIS — Z8669 Personal history of other diseases of the nervous system and sense organs: Secondary | ICD-10-CM | POA: Insufficient documentation

## 2015-04-06 DIAGNOSIS — Y9389 Activity, other specified: Secondary | ICD-10-CM | POA: Diagnosis not present

## 2015-04-06 DIAGNOSIS — Y9301 Activity, walking, marching and hiking: Secondary | ICD-10-CM | POA: Diagnosis not present

## 2015-04-06 DIAGNOSIS — Z8719 Personal history of other diseases of the digestive system: Secondary | ICD-10-CM | POA: Insufficient documentation

## 2015-04-06 DIAGNOSIS — Z973 Presence of spectacles and contact lenses: Secondary | ICD-10-CM | POA: Insufficient documentation

## 2015-04-06 DIAGNOSIS — Z79899 Other long term (current) drug therapy: Secondary | ICD-10-CM | POA: Diagnosis not present

## 2015-04-06 DIAGNOSIS — Y998 Other external cause status: Secondary | ICD-10-CM | POA: Insufficient documentation

## 2015-04-06 DIAGNOSIS — Z8679 Personal history of other diseases of the circulatory system: Secondary | ICD-10-CM | POA: Insufficient documentation

## 2015-04-06 DIAGNOSIS — S42202A Unspecified fracture of upper end of left humerus, initial encounter for closed fracture: Secondary | ICD-10-CM

## 2015-04-06 DIAGNOSIS — W19XXXA Unspecified fall, initial encounter: Secondary | ICD-10-CM

## 2015-04-06 LAB — CBC
HCT: 36.1 % (ref 36.0–46.0)
Hemoglobin: 12.3 g/dL (ref 12.0–15.0)
MCH: 31.1 pg (ref 26.0–34.0)
MCHC: 34.1 g/dL (ref 30.0–36.0)
MCV: 91.2 fL (ref 78.0–100.0)
Platelets: 381 10*3/uL (ref 150–400)
RBC: 3.96 MIL/uL (ref 3.87–5.11)
RDW: 13.6 % (ref 11.5–15.5)
WBC: 15.7 10*3/uL — ABNORMAL HIGH (ref 4.0–10.5)

## 2015-04-06 LAB — BASIC METABOLIC PANEL
Anion gap: 8 (ref 5–15)
BUN: 15 mg/dL (ref 6–20)
CO2: 28 mmol/L (ref 22–32)
Calcium: 8.9 mg/dL (ref 8.9–10.3)
Chloride: 99 mmol/L — ABNORMAL LOW (ref 101–111)
Creatinine, Ser: 0.81 mg/dL (ref 0.44–1.00)
GFR calc non Af Amer: >60 mL/min (ref >60)
Glucose, Bld: 144 mg/dL — ABNORMAL HIGH (ref 65–99)
Potassium: 4.3 mmol/L (ref 3.5–5.1)
Sodium: 135 mmol/L (ref 135–145)

## 2015-04-06 LAB — I-STAT CHEM 8, ED
BUN: 17 mg/dL (ref 6–20)
CHLORIDE: 99 mmol/L — AB (ref 101–111)
CREATININE: 0.8 mg/dL (ref 0.44–1.00)
Calcium, Ion: 1.06 mmol/L — ABNORMAL LOW (ref 1.13–1.30)
Glucose, Bld: 142 mg/dL — ABNORMAL HIGH (ref 65–99)
HCT: 41 % (ref 36.0–46.0)
Hemoglobin: 13.9 g/dL (ref 12.0–15.0)
POTASSIUM: 4.3 mmol/L (ref 3.5–5.1)
SODIUM: 136 mmol/L (ref 135–145)
TCO2: 25 mmol/L (ref 0–100)

## 2015-04-06 LAB — I-STAT TROPONIN, ED: TROPONIN I, POC: 0 ng/mL (ref 0.00–0.08)

## 2015-04-06 MED ORDER — OXYCODONE-ACETAMINOPHEN 5-325 MG PO TABS: 1.0000 | ORAL_TABLET | Freq: Four times a day (QID) | ORAL | Status: DC | PRN

## 2015-04-06 MED ORDER — HYDROMORPHONE HCL 1 MG/ML IJ SOLN
0.5000 mg | Freq: Once | INTRAMUSCULAR | Status: AC
  Administered 2015-04-06: 0.5 mg via INTRAVENOUS
  Filled 2015-04-06: qty 1

## 2015-04-06 MED ORDER — ONDANSETRON HCL 4 MG PO TABS: 4.0000 mg | ORAL_TABLET | Freq: Three times a day (TID) | ORAL | Status: DC | PRN

## 2015-04-06 MED ORDER — ONDANSETRON HCL 4 MG/2ML IJ SOLN
4.0000 mg | Freq: Once | INTRAMUSCULAR | Status: AC
  Administered 2015-04-06: 4 mg via INTRAVENOUS
  Filled 2015-04-06: qty 2

## 2015-04-06 NOTE — ED Provider Notes (Signed)
Care assumed from [place], PA-C at 4:30 PM. Please see her note for history of present illness and exam. Briefly, the patient is a 81 year old female who presents here for fall with humeral fracture. Ortho will see pt in clinic for follow up. Pending CT head. She is insisting on going home. Son in room and will help arrange care. Case management will also see and arrange for home visit.   CT head and C-spine with no acute injuries. Case manager arrange for follow-up at home. I spoke with the son who confirmed that someone will be there with the patient in 24 hours a day until her orthopedic appointment. Patient was provided with a shoulder sling and immobilization.  Patient discharged in good condition with strict return precautions. Patient to follow-up with orthopedic surgery tomorrow for assessment of possible surgery.  Patient seen in conjunction with Dr. Canary Brim.  Sibyl Parr, M.D. Resident  Addison Lank, MD 04/07/15 [place], MD 04/07/15 [phone]

## 2015-04-06 NOTE — ED Notes (Signed)
Pts SpO2 dropped to 86%, pt placed on 2L [place] pts SpO2 improved to 96%.

## 2015-04-06 NOTE — ED Notes (Addendum)
Per EMS - Pt fell in a parking lot leaving grocery store 2 hours ago, pt denies LOC, but does state "I don't know how I fell." Main complaint of pain to left upper arm. Obvious deformity noted by EMS to left arm. Pt arrives in c-collar and KED, pt son is a Marine scientist and applied an arm sling to left arm prior to EMS arrival. Pt drove home after the fall and then called EMS. Received 50 mcg fentanyl 4 of zofran. 12 Lead shows left BBB. Has hx of a fib. BP 140/73, HR 73. Pt a/o x4.

## 2015-04-06 NOTE — ED Notes (Signed)
Patient transported to X-ray 

## 2015-04-06 NOTE — ED Notes (Signed)
C-Spine clear by Dr. Leonette Monarch and c-collar removed.

## 2015-04-06 NOTE — ED Provider Notes (Signed)
CSN: [id number]     Arrival date & time 04/06/15  1352 History   First MD Initiated Contact with Patient 04/06/15 1405     No chief complaint on file.    (Consider location/radiation/quality/duration/timing/severity/associated sxs/prior Treatment) HPI  Yvonne [name] is a 81 y.o. female with PMH of left bundle-branch block, atrial fibrillation on no anticoagulants, mitral and aortic valve insufficiency, SVT presenting with fall while walking in the parking lot 2 hours ago. Patient endorses head injury but not sure and denies loss of consciousness. Pt states she remembers the entire incident and has fallen 3 times in last week. She denies any prodrome before the fall. Patient fell landing on her left shoulder/arm. Patient with significant anterior swelling. Patient denies headache, visual changes, weakness. She denies neck pain. Patient endorses nausea but no abdominal pain. She denies chest pain, shortness of breath, back pain.   Past Medical History  Diagnosis Date  . Hemorrhage of gastrointestinal tract, unspecified   . Anxiety state, unspecified   . Left ventricular systolic dysfunction     hx of mild  . History of peptic ulcer   . Esophageal reflux   . Mitral valve insufficiency and aortic valve insufficiency   . Palpitations     history  . Other primary cardiomyopathies   . Wears glasses   . Other left bundle branch block   . Paroxysmal supraventricular tachycardia   . LBBB (left bundle branch block)    Past Surgical History  Procedure Laterality Date  . Esophagogastroduodenoscopy    . Tonsillectomy    . Appendectomy    . Abdominal hysterectomy    . Tubal ligation    . Knee arthroscopy  2009    left  . Breast surgery      biopsy  . Open reduction internal fixation (orif) metacarpal Left 11/05/2013    Procedure: CLOSED REDUCTION/PINNING VS OPEN REDUCTION INTERNAL FIXATION (ORIF) LEFT SMALL  METACARPAL FRACTURE ;  Surgeon: Cammie Sickle., MD;  Location: [facility];  Service: Orthopedics;  Laterality: Left;   Family History  Problem Relation Age of Onset  . Hypertension Mother    History  Substance Use Topics  . Smoking status: Never Smoker   . Smokeless tobacco: Never Used  . Alcohol Use: No   OB History    No data available     Review of Systems 10 Systems reviewed and are negative for acute change except as noted in the HPI.    Allergies  Aleve and Aspirin  Home Medications   Prior to Admission medications   Medication Sig Start Date End Date Taking? Authorizing Provider  aspirin EC 81 MG tablet Take 1 tablet (81 mg total) by mouth daily. 11/12/11  Yes Larey Dresser, MD  Calcium-Magnesium-Zinc (CAL-MAG-ZINC PO) Take 1 tablet by mouth daily.    Yes Historical Provider, MD  clorazepate (TRANXENE) 7.5 MG tablet Take 7.5 mg by mouth 2 (two) times daily as needed for anxiety.    Yes Historical Provider, MD  estradiol (ESTRACE) 1 MG tablet Take 0.5 mg by mouth daily.   Yes Historical Provider, MD  fluticasone (FLONASE) 50 MCG/ACT nasal spray Place 1 spray into both nostrils daily as needed for allergies.  02/25/15  Yes Historical Provider, MD  Multiple Vitamins-Minerals (CENTRUM SILVER PO) Take 1 tablet by mouth daily.    Yes Historical Provider, MD  propafenone (RYTHMOL) 150 MG tablet Take 150 mg by mouth 2 (two) times daily before lunch and supper.  Yes Historical Provider, MD  sertraline (ZOLOFT) 25 MG tablet Take 75 mg by mouth daily.  01/12/14  Yes Historical Provider, MD  traMADol (ULTRAM) 50 MG tablet Take 50 mg by mouth every 6 (six) hours as needed for moderate pain or severe pain.    Yes Historical Provider, MD  vitamin C (ASCORBIC ACID) 500 MG tablet Take 500 mg by mouth daily.   Yes Historical Provider, MD   BP 139/65 mmHg  Pulse 74  Temp(Src) 97.9 F (36.6 C) (Oral)  Resp 17  SpO2 96% Physical Exam  Constitutional: She appears well-developed and well-nourished. No distress.  HENT:  Head: Normocephalic.   No hemotympanum, no septal hematoma, no malocclusion, no mid-face tenderness   Eyes: Conjunctivae and EOM are normal. Pupils are equal, round, and reactive to light. Right eye exhibits no discharge. Left eye exhibits no discharge.  Neck:  No midline C-spine tenderness. Patient in c-collar  Cardiovascular: Normal rate, regular rhythm and normal heart sounds.   2+ pulses equal bilaterally.  Pulmonary/Chest: Effort normal and breath sounds normal. No respiratory distress. She has no wheezes.  No chest wall tenderness  Abdominal: Soft. Bowel sounds are normal. She exhibits no distension. There is no tenderness.  Musculoskeletal:  No significant midline spine tenderness, no crepitus or step-offs. No clavicular step-off or crepitus. Patient with obvious deformity to left shoulder with ecchymoses. Patient able to move her fingers without difficulty with intact flexion and extension. Sensation intact. Grip strength equal bilaterally.  Neurological: She is alert. No cranial nerve deficit. She exhibits normal muscle tone. Coordination normal.  Speech is clear and goal oriented Moves extremities without ataxia  Strength 5/5 in upper and lower extremities. Sensation intact. No pronator drift. Normal gait.   Skin: Skin is warm and dry. She is not diaphoretic.  Nursing note and vitals reviewed.   ED Course  Procedures (including critical care time) Labs Review Labs Reviewed  CBC - Abnormal; Notable for the following:    WBC 15.7 (*)    All other components within normal limits  BASIC METABOLIC PANEL - Abnormal; Notable for the following:    Chloride 99 (*)    Glucose, Bld 144 (*)    All other components within normal limits  I-STAT CHEM 8, ED - Abnormal; Notable for the following:    Chloride 99 (*)    Glucose, Bld 142 (*)    Calcium, Ion 1.06 (*)    All other components within normal limits  URINALYSIS, ROUTINE W REFLEX MICROSCOPIC (NOT AT [facility])  Randolm Idol, ED    Imaging  Review Dg Chest 1 View  04/06/2015   CLINICAL DATA:  Full a supermarket today. Deformity of the shoulder.  EXAM: CHEST  1 VIEW  COMPARISON:  Two-view chest x-ray 09/13/2010  FINDINGS: The heart is mildly enlarged. Atherosclerotic changes are present in the aorta. Mild left basilar atelectasis is present. The lungs are otherwise clear. The proximal left humerus fracture is noted.  IMPRESSION: 1. Proximal left humerus fracture. 2. Stable cardiomegaly without failure. 3. Mild left basilar atelectasis.   Electronically Signed   By: San Morelle M.D.   On: 04/06/2015 15:29   Dg Shoulder Left  04/06/2015   CLINICAL DATA:  Bruising and pain of the left shoulder after after a fall in the supermarket today.  EXAM: LEFT SHOULDER - 2+ VIEW  COMPARISON:  None.  FINDINGS: There is a comminuted and markedly displaced angulated fracture of the left humeral neck. The articular surface of the humerus is  intact. No dislocation.  IMPRESSION: Comminuted markedly displaced fracture of the proximal left humerus.   Electronically Signed   By: Lorriane Shire M.D.   On: 04/06/2015 15:28   Dg Humerus Left  04/06/2015   CLINICAL DATA:  Fall at a supermarket today with left shoulder injury and deformity. Initial encounter.  EXAM: LEFT HUMERUS - 2+ VIEW  COMPARISON:  Shoulder films on the same date.  FINDINGS: Comminuted and displaced fracture is identified of the proximal left humerus through the surgical neck. The humeral shaft is displaced medially relative to the humeral head by approximately 1 shaft width. No dislocation. Overlying soft tissue swelling/ hematoma present. No bony lesions are seen.  IMPRESSION: Comminuted and displaced surgical neck fracture of the proximal left humerus.   Electronically Signed   By: Aletta Edouard M.D.   On: 04/06/2015 15:37     EKG Interpretation   Date/Time:  Thursday April 06 2015 14:11:45 EDT Ventricular Rate:  67 PR Interval:  184 QRS Duration: 180 QT Interval:  460 QTC  Calculation: 486 R Axis:   -64 Text Interpretation:  Sinus rhythm Left bundle branch block No significant  change since last tracing Confirmed by [facility]  MD, MARTHA [phone]) on  04/06/2015 2:23:27 PM      Meds given in ED:  Medications  HYDROmorphone (DILAUDID) injection 0.5 mg (0.5 mg Intravenous Given 04/06/15 1430)  ondansetron (ZOFRAN) injection 4 mg (4 mg Intravenous Given 04/06/15 1542)  HYDROmorphone (DILAUDID) injection 0.5 mg (0.5 mg Intravenous Given 04/06/15 1544)    New Prescriptions   No medications on file      MDM   Final diagnoses:  Fall, initial encounter  Head injury, initial encounter  Proximal humeral fracture, left, closed, initial encounter   Patient presenting with fall without clear syncope. Patient denies chest pain, shortness of breath or program. VSS. Neurological exam without deficits. Lab work on contributory. Negative troponin and ekg without significant change. Patient with proximal left humeral fracture with displacement and comminution. Neurovascularly intact. Spoke with Dr. Ninfa Linden who recommended sling as well as follow-up in the office. Concerning the patient's cardiac history, history of frequent falls as well as intractable pain from humeral fracture. Recommended admission for the patient. She adamantly refuses stating she needs to be at home so she can help care for her husband who has Alzheimer's. Son bedside and has further discuss risk and negatives with patient and patient adamantly refused this. Discussed the importance of following up with primary care, cardiology as well as orthopedics. Consult to case management for home health. Son bedside and states he will stay with her at home as well as contacting her sister so she can have further help in the home.   Head CT and neck CT pending.  Pt signed out to Yvonne Lank MD   Discussed return precautions with patient. Discussed all results and patient verbalizes understanding and agrees with  plan.  This is a shared patient. This patient was discussed with the physician who saw and evaluated the patient and agrees with the plan.    Al Corpus, PA-C 04/07/15 [id number]  Alfonzo Beers, MD 04/07/15 [phone]

## 2015-04-06 NOTE — ED Notes (Signed)
Pt alert x4. NAD at this time.

## 2015-04-06 NOTE — Discharge Instructions (Signed)
Return to the emergency room with worsening of symptoms, new symptoms or with symptoms that are concerning , especially severe worsening of headache, visual or speech changes, weakness in face, arms or legs OR unable to move or feel fingers, blue fingers, numbness tingling, weakness, fevers. RICE: Rest, Ice (three cycles of 20 mins on, 16mins off at least twice a day), compression/brace, elevation.  Call to make follow up appointment with orthopedics. Percocet for severe pain. Do not operate machinery, drive or drink alcohol while taking narcotics or muscle relaxers. Call to make follow up with cardiology for further work up of your falls. Please call your doctor for a followup appointment within 24-48 hours. When you talk to your doctor please let them know that you were seen in the emergency department and have them acquire all of your records so that they can discuss the findings with you and formulate a treatment plan to fully care for your new and ongoing problems. Read below information and follow recommendations. Humerus Fracture, Treated with Immobilization The humerus is the large bone in your upper arm. You have a broken (fractured) humerus. These fractures are easily diagnosed with X-rays. TREATMENT  Simple fractures which will heal without disability are treated with simple immobilization. Immobilization means you will wear a cast, splint, or sling. You have a fracture which will do well with immobilization. The fracture will heal well simply by being held in a good position until it is stable enough to begin range of motion exercises. Do not take part in activities which would further injure your arm.  HOME CARE INSTRUCTIONS   Put ice on the injured area.  Put ice in a plastic bag.  Place a towel between your skin and the bag.  Leave the ice on for 15-20 minutes, 03-04 times a day.  If you have a cast:  Do not scratch the skin under the cast using sharp or pointed  objects.  Check the skin around the cast every day. You may put lotion on any red or sore areas.  Keep your cast dry and clean.  If you have a splint:  Wear the splint as directed.  Keep your splint dry and clean.  You may loosen the elastic around the splint if your fingers become numb, tingle, or turn cold or blue.  If you have a sling:  Wear the sling as directed.  Do not put pressure on any part of your cast or splint until it is fully hardened.  Your cast or splint can be protected during bathing with a plastic bag. Do not lower the cast or splint into water.  Only take over-the-counter or prescription medicines for pain, discomfort, or fever as directed by your caregiver.  Do range of motion exercises as instructed by your caregiver.  Follow up as directed by your caregiver. This is very important in order to avoid permanent injury or disability and chronic pain. SEEK IMMEDIATE MEDICAL CARE IF:   Your skin or nails in the injured arm turn blue or gray.  Your arm feels cold or numb.  You develop severe pain in the injured arm.  You are having problems with the medicines you were given. MAKE SURE YOU:   Understand these instructions.  Will watch your condition.  Will get help right away if you are not doing well or get worse. Document Released: 01/20/2001 Document Revised: 01/06/2012 Document Reviewed: 11/28/2010 [facility] Patient Information 2015 [place], [place]. This information is not intended to replace advice given to you  by your health care provider. Make sure you discuss any questions you have with your health care provider.

## 2015-04-06 NOTE — ED Notes (Signed)
Patient transported to CT 

## 2015-04-06 NOTE — ED Notes (Signed)
Case management at the bedside

## 2015-04-07 ENCOUNTER — Other Ambulatory Visit (HOSPITAL_COMMUNITY): Payer: Self-pay | Admitting: Orthopaedic Surgery

## 2015-04-07 NOTE — Progress Notes (Signed)
I called home number and it was busy and I called second number and  no answer or voice mail, I tried each number 3 times over an hour.

## 2015-04-07 NOTE — Care Management (Signed)
ED CM consulted concerning possible HH needs.  Patient presented to [facility] ED s/p mechanical fall.  Shoulder film revealed a comminuted markedly displaced fracture of the proximal left humerus, ortho was consulted, it appears that admission was recommended with surgery, patient is refusing to stay stating , she cares for her husband with Alzheimer  patient made aware risk. Plan is pain management  until OP Ortho eval tomorrow CM spoke with patient and Liam Bossman [phone] regarding [facility] recommendation. CM  explained [facility] would not be an option at this phase of care,  Discussed Private duty and offered a list of agencies. Son states, that patient will have 75 care until she evaluated by an Orthopedist. Recommended that patient wait until after surgical procedure to be assessed for [facility] needs, Patient and family verbalized understanding and  are in agreement. Updated Dr. Antony Odea agreeable with disposition plan. ED CM will follow up with patient tomorrow.

## 2015-04-08 NOTE — Progress Notes (Signed)
Give instructions and arrival time to daughter, patient not available when I called. Did not review history as daughter reports that she does not know her mother's complete medical history. Left number to call back with any questions

## 2015-04-10 ENCOUNTER — Encounter (HOSPITAL_COMMUNITY): Payer: Self-pay | Admitting: *Deleted

## 2015-04-10 ENCOUNTER — Inpatient Hospital Stay (HOSPITAL_COMMUNITY): Payer: Medicare Other

## 2015-04-10 ENCOUNTER — Inpatient Hospital Stay (HOSPITAL_COMMUNITY)
Admission: RE | Admit: 2015-04-10 | Discharge: 2015-04-12 | DRG: 493 | Disposition: A | Discharge status: Home / Self Care | Payer: Medicare Other | Source: Ambulatory Visit | Attending: Orthopaedic Surgery | Admitting: Orthopaedic Surgery

## 2015-04-10 ENCOUNTER — Inpatient Hospital Stay (HOSPITAL_COMMUNITY): Payer: Medicare Other | Admitting: Anesthesiology

## 2015-04-10 ENCOUNTER — Encounter (HOSPITAL_COMMUNITY)
Admission: RE | Disposition: A | Discharge status: Home / Self Care | Payer: Self-pay | Source: Ambulatory Visit | Attending: Orthopaedic Surgery

## 2015-04-10 DIAGNOSIS — Z419 Encounter for procedure for purposes other than remedying health state, unspecified: Secondary | ICD-10-CM

## 2015-04-10 DIAGNOSIS — Z886 Allergy status to analgesic agent status: Secondary | ICD-10-CM | POA: Diagnosis not present

## 2015-04-10 DIAGNOSIS — Z79899 Other long term (current) drug therapy: Secondary | ICD-10-CM

## 2015-04-10 DIAGNOSIS — F329 Major depressive disorder, single episode, unspecified: Secondary | ICD-10-CM | POA: Diagnosis present

## 2015-04-10 DIAGNOSIS — S42209A Unspecified fracture of upper end of unspecified humerus, initial encounter for closed fracture: Secondary | ICD-10-CM | POA: Diagnosis present

## 2015-04-10 DIAGNOSIS — W1830XA Fall on same level, unspecified, initial encounter: Secondary | ICD-10-CM | POA: Diagnosis present

## 2015-04-10 DIAGNOSIS — I429 Cardiomyopathy, unspecified: Secondary | ICD-10-CM | POA: Diagnosis present

## 2015-04-10 DIAGNOSIS — Z8249 Family history of ischemic heart disease and other diseases of the circulatory system: Secondary | ICD-10-CM

## 2015-04-10 DIAGNOSIS — I4891 Unspecified atrial fibrillation: Secondary | ICD-10-CM | POA: Diagnosis present

## 2015-04-10 DIAGNOSIS — I080 Rheumatic disorders of both mitral and aortic valves: Secondary | ICD-10-CM | POA: Diagnosis present

## 2015-04-10 DIAGNOSIS — I447 Left bundle-branch block, unspecified: Secondary | ICD-10-CM | POA: Diagnosis present

## 2015-04-10 DIAGNOSIS — S42212A Unspecified displaced fracture of surgical neck of left humerus, initial encounter for closed fracture: Principal | ICD-10-CM | POA: Diagnosis present

## 2015-04-10 DIAGNOSIS — Z7982 Long term (current) use of aspirin: Secondary | ICD-10-CM | POA: Diagnosis not present

## 2015-04-10 DIAGNOSIS — F411 Generalized anxiety disorder: Secondary | ICD-10-CM | POA: Diagnosis present

## 2015-04-10 DIAGNOSIS — K219 Gastro-esophageal reflux disease without esophagitis: Secondary | ICD-10-CM | POA: Diagnosis present

## 2015-04-10 HISTORY — DX: Malignant (primary) neoplasm, unspecified: C80.1

## 2015-04-10 HISTORY — PX: ORIF HUMERUS FRACTURE: SHX2126

## 2015-04-10 HISTORY — DX: Unspecified cataract: H26.9

## 2015-04-10 HISTORY — DX: Unspecified osteoarthritis, unspecified site: M19.90

## 2015-04-10 SURGERY — OPEN REDUCTION INTERNAL FIXATION (ORIF) PROXIMAL HUMERUS FRACTURE
Anesthesia: Regional | Site: Arm Upper | Laterality: Left

## 2015-04-10 MED ORDER — PHENOL 1.4 % MT LIQD: 1.0000 | OROMUCOSAL | Status: DC | PRN

## 2015-04-10 MED ORDER — FENTANYL CITRATE (PF) 100 MCG/2ML IJ SOLN
INTRAMUSCULAR | Status: AC
  Filled 2015-04-10: qty 2

## 2015-04-10 MED ORDER — OXYCODONE HCL 5 MG PO TABS: 5.0000 mg | ORAL_TABLET | Freq: Once | ORAL | Status: DC | PRN

## 2015-04-10 MED ORDER — FENTANYL CITRATE (PF) 100 MCG/2ML IJ SOLN: 100.0000 ug | Freq: Once | INTRAMUSCULAR | Status: DC

## 2015-04-10 MED ORDER — OXYCODONE HCL 5 MG/5ML PO SOLN: 5.0000 mg | Freq: Once | ORAL | Status: DC | PRN

## 2015-04-10 MED ORDER — SODIUM CHLORIDE 0.9 % IJ SOLN
INTRAMUSCULAR | Status: AC
  Filled 2015-04-10: qty 10

## 2015-04-10 MED ORDER — GLYCOPYRROLATE 0.2 MG/ML IJ SOLN
INTRAMUSCULAR | Status: DC | PRN
  Administered 2015-04-10: 0.4 mg via INTRAVENOUS

## 2015-04-10 MED ORDER — POTASSIUM CHLORIDE IN NACL 20-0.45 MEQ/L-% IV SOLN
INTRAVENOUS | Status: DC
Start: 2015-04-10 — End: 2015-04-12
  Administered 2015-04-10: 23:00:00 via INTRAVENOUS
  Filled 2015-04-10 (×5): qty 1000

## 2015-04-10 MED ORDER — MIDAZOLAM HCL 2 MG/2ML IJ SOLN: 2.0000 mg | Freq: Once | INTRAMUSCULAR | Status: DC

## 2015-04-10 MED ORDER — EPHEDRINE SULFATE 50 MG/ML IJ SOLN
INTRAMUSCULAR | Status: AC
  Filled 2015-04-10: qty 1

## 2015-04-10 MED ORDER — PHENYLEPHRINE HCL 10 MG/ML IJ SOLN
10.0000 mg | INTRAVENOUS | Status: DC | PRN
  Administered 2015-04-10: 20 ug/min via INTRAVENOUS

## 2015-04-10 MED ORDER — ONDANSETRON HCL 4 MG/2ML IJ SOLN
INTRAMUSCULAR | Status: DC | PRN
  Administered 2015-04-10: 4 mg via INTRAVENOUS

## 2015-04-10 MED ORDER — NEOSTIGMINE METHYLSULFATE 10 MG/10ML IV SOLN
INTRAVENOUS | Status: DC | PRN
  Administered 2015-04-10: 3 mg via INTRAVENOUS

## 2015-04-10 MED ORDER — ROCURONIUM BROMIDE 100 MG/10ML IV SOLN
INTRAVENOUS | Status: DC | PRN
  Administered 2015-04-10: 40 mg via INTRAVENOUS

## 2015-04-10 MED ORDER — SENNOSIDES-DOCUSATE SODIUM 8.6-50 MG PO TABS: 1.0000 | ORAL_TABLET | Freq: Every evening | ORAL | Status: DC | PRN

## 2015-04-10 MED ORDER — ACETAMINOPHEN 160 MG/5ML PO SOLN
325.0000 mg | ORAL | Status: DC | PRN
Start: 2015-04-10 — End: 2015-04-10
  Filled 2015-04-10: qty 20.3

## 2015-04-10 MED ORDER — DEXTROSE 5 % IV SOLN
500.0000 mg | Freq: Four times a day (QID) | INTRAVENOUS | Status: DC | PRN
  Filled 2015-04-10: qty 5

## 2015-04-10 MED ORDER — BISACODYL 10 MG RE SUPP: 10.0000 mg | Freq: Every day | RECTAL | Status: DC | PRN

## 2015-04-10 MED ORDER — ACETAMINOPHEN 650 MG RE SUPP: 650.0000 mg | Freq: Four times a day (QID) | RECTAL | Status: DC | PRN

## 2015-04-10 MED ORDER — 0.9 % SODIUM CHLORIDE (POUR BTL) OPTIME
TOPICAL | Status: DC | PRN
  Administered 2015-04-10: 1000 mL

## 2015-04-10 MED ORDER — METOCLOPRAMIDE HCL 5 MG/ML IJ SOLN
5.0000 mg | Freq: Three times a day (TID) | INTRAMUSCULAR | Status: DC | PRN
Start: 2015-04-10 — End: 2015-04-12

## 2015-04-10 MED ORDER — ONDANSETRON HCL 4 MG PO TABS: 4.0000 mg | ORAL_TABLET | Freq: Three times a day (TID) | ORAL | Status: DC | PRN

## 2015-04-10 MED ORDER — PROPOFOL 10 MG/ML IV BOLUS
INTRAVENOUS | Status: DC | PRN
  Administered 2015-04-10: 30 mg via INTRAVENOUS
  Administered 2015-04-10: 120 mg via INTRAVENOUS

## 2015-04-10 MED ORDER — ONDANSETRON HCL 4 MG/2ML IJ SOLN
INTRAMUSCULAR | Status: AC
  Filled 2015-04-10: qty 2

## 2015-04-10 MED ORDER — CEFAZOLIN SODIUM-DEXTROSE 2-3 GM-% IV SOLR
INTRAVENOUS | Status: AC
  Filled 2015-04-10: qty 50

## 2015-04-10 MED ORDER — ACETAMINOPHEN 325 MG PO TABS
650.0000 mg | ORAL_TABLET | Freq: Four times a day (QID) | ORAL | Status: DC | PRN
  Administered 2015-04-11: 650 mg via ORAL
  Filled 2015-04-10: qty 2

## 2015-04-10 MED ORDER — FLEET ENEMA 7-19 GM/118ML RE ENEM
1.0000 | ENEMA | Freq: Once | RECTAL | Status: AC
  Administered 2015-04-10: 1 via RECTAL
  Filled 2015-04-10: qty 1

## 2015-04-10 MED ORDER — ARTIFICIAL TEARS OP OINT
TOPICAL_OINTMENT | OPHTHALMIC | Status: AC
  Filled 2015-04-10: qty 3.5

## 2015-04-10 MED ORDER — OXYCODONE HCL 5 MG PO TABS
5.0000 mg | ORAL_TABLET | Freq: Four times a day (QID) | ORAL | Status: DC | PRN
Start: 2015-04-10 — End: 2015-04-11
  Administered 2015-04-10 – 2015-04-11 (×2): 10 mg via ORAL
  Filled 2015-04-10 (×2): qty 2

## 2015-04-10 MED ORDER — LIDOCAINE HCL (CARDIAC) 20 MG/ML IV SOLN
INTRAVENOUS | Status: DC | PRN
  Administered 2015-04-10: 60 mg via INTRAVENOUS

## 2015-04-10 MED ORDER — DEXAMETHASONE SODIUM PHOSPHATE 4 MG/ML IJ SOLN
INTRAMUSCULAR | Status: DC | PRN
  Administered 2015-04-10: 4 mg via INTRAVENOUS

## 2015-04-10 MED ORDER — ONDANSETRON HCL 4 MG PO TABS
4.0000 mg | ORAL_TABLET | Freq: Four times a day (QID) | ORAL | Status: DC | PRN
  Administered 2015-04-12: 4 mg via ORAL
  Filled 2015-04-10: qty 1

## 2015-04-10 MED ORDER — FENTANYL CITRATE (PF) 100 MCG/2ML IJ SOLN: 25.0000 ug | INTRAMUSCULAR | Status: DC | PRN

## 2015-04-10 MED ORDER — FENTANYL CITRATE (PF) 100 MCG/2ML IJ SOLN
INTRAMUSCULAR | Status: DC | PRN
  Administered 2015-04-10: 50 ug via INTRAVENOUS
  Administered 2015-04-10: 25 ug via INTRAVENOUS

## 2015-04-10 MED ORDER — DEXAMETHASONE SODIUM PHOSPHATE 4 MG/ML IJ SOLN
INTRAMUSCULAR | Status: AC
  Filled 2015-04-10: qty 1

## 2015-04-10 MED ORDER — LACTATED RINGERS IV SOLN
INTRAVENOUS | Status: DC
  Administered 2015-04-10: 13:00:00 via INTRAVENOUS

## 2015-04-10 MED ORDER — NEOSTIGMINE METHYLSULFATE 10 MG/10ML IV SOLN
INTRAVENOUS | Status: AC
  Filled 2015-04-10: qty 3

## 2015-04-10 MED ORDER — PROPOFOL 10 MG/ML IV BOLUS
INTRAVENOUS | Status: AC
  Filled 2015-04-10: qty 20

## 2015-04-10 MED ORDER — GLYCOPYRROLATE 0.2 MG/ML IJ SOLN
INTRAMUSCULAR | Status: AC
Start: 2015-04-10 — End: 2015-04-10
  Filled 2015-04-10: qty 2

## 2015-04-10 MED ORDER — LACTATED RINGERS IV SOLN
INTRAVENOUS | Status: DC | PRN
  Administered 2015-04-10 (×3): via INTRAVENOUS

## 2015-04-10 MED ORDER — PROPAFENONE HCL 150 MG PO TABS
150.0000 mg | ORAL_TABLET | Freq: Two times a day (BID) | ORAL | Status: DC
  Administered 2015-04-11 – 2015-04-12 (×2): 150 mg via ORAL
  Filled 2015-04-10 (×4): qty 1

## 2015-04-10 MED ORDER — DOCUSATE SODIUM 100 MG PO CAPS
100.0000 mg | ORAL_CAPSULE | Freq: Two times a day (BID) | ORAL | Status: DC
  Administered 2015-04-10 – 2015-04-11 (×3): 100 mg via ORAL
  Filled 2015-04-10 (×4): qty 1

## 2015-04-10 MED ORDER — ACETAMINOPHEN 325 MG PO TABS: 325.0000 mg | ORAL_TABLET | ORAL | Status: DC | PRN

## 2015-04-10 MED ORDER — DEXAMETHASONE SODIUM PHOSPHATE 10 MG/ML IJ SOLN
INTRAMUSCULAR | Status: AC
  Filled 2015-04-10: qty 1

## 2015-04-10 MED ORDER — CEFAZOLIN SODIUM-DEXTROSE 2-3 GM-% IV SOLR
2.0000 g | Freq: Once | INTRAVENOUS | Status: AC
  Administered 2015-04-10: 2 g via INTRAVENOUS

## 2015-04-10 MED ORDER — FLUTICASONE PROPIONATE 50 MCG/ACT NA SUSP
1.0000 | Freq: Every day | NASAL | Status: DC | PRN
  Filled 2015-04-10: qty 16

## 2015-04-10 MED ORDER — SUCCINYLCHOLINE CHLORIDE 20 MG/ML IJ SOLN
INTRAMUSCULAR | Status: AC
  Filled 2015-04-10: qty 1

## 2015-04-10 MED ORDER — MIDAZOLAM HCL 2 MG/2ML IJ SOLN
INTRAMUSCULAR | Status: AC
  Filled 2015-04-10: qty 2

## 2015-04-10 MED ORDER — METHOCARBAMOL 500 MG PO TABS
500.0000 mg | ORAL_TABLET | Freq: Three times a day (TID) | ORAL | Status: DC | PRN
  Administered 2015-04-10 – 2015-04-12 (×4): 500 mg via ORAL
  Filled 2015-04-10 (×5): qty 1

## 2015-04-10 MED ORDER — FENTANYL CITRATE (PF) 250 MCG/5ML IJ SOLN
INTRAMUSCULAR | Status: AC
  Filled 2015-04-10: qty 5

## 2015-04-10 MED ORDER — ONDANSETRON HCL 4 MG/2ML IJ SOLN: 4.0000 mg | Freq: Four times a day (QID) | INTRAMUSCULAR | Status: DC | PRN

## 2015-04-10 MED ORDER — SERTRALINE HCL 50 MG PO TABS
75.0000 mg | ORAL_TABLET | Freq: Every day | ORAL | Status: DC
  Administered 2015-04-11: 75 mg via ORAL
  Filled 2015-04-10 (×4): qty 1

## 2015-04-10 MED ORDER — MENTHOL 3 MG MT LOZG: 1.0000 | LOZENGE | OROMUCOSAL | Status: DC | PRN

## 2015-04-10 MED ORDER — ROCURONIUM BROMIDE 50 MG/5ML IV SOLN
INTRAVENOUS | Status: AC
  Filled 2015-04-10: qty 1

## 2015-04-10 MED ORDER — LIDOCAINE HCL (CARDIAC) 20 MG/ML IV SOLN
INTRAVENOUS | Status: AC
  Filled 2015-04-10: qty 15

## 2015-04-10 MED ORDER — METOCLOPRAMIDE HCL 5 MG PO TABS
5.0000 mg | ORAL_TABLET | Freq: Three times a day (TID) | ORAL | Status: DC | PRN
  Administered 2015-04-12: 10 mg via ORAL
  Filled 2015-04-10: qty 2

## 2015-04-10 SURGICAL SUPPLY — 58 items
BENZOIN TINCTURE PRP APPL 2/3 (GAUZE/BANDAGES/DRESSINGS) ×3 IMPLANT
BIT DRILL 3.2 (BIT) ×2
BIT DRILL 3.2XCALB NS DISP (BIT) ×1 IMPLANT
BIT DRILL CALIBRATED 2.7 (BIT) ×2 IMPLANT
BIT DRILL CALIBRATED 2.7MM (BIT) ×1
BIT DRL 3.2XCALB NS DISP (BIT) ×1
CLOSURE WOUND 1/2 X4 (GAUZE/BANDAGES/DRESSINGS) ×1
COVER SURGICAL LIGHT HANDLE (MISCELLANEOUS) ×3 IMPLANT
DRAPE C-ARM 42X72 X-RAY (DRAPES) ×3 IMPLANT
DRAPE IMP U-DRAPE 54X76 (DRAPES) ×3 IMPLANT
DRAPE INCISE IOBAN 66X45 STRL (DRAPES) IMPLANT
DRAPE SURG 17X23 STRL (DRAPES) ×3 IMPLANT
DRAPE U-SHAPE 47X51 STRL (DRAPES) ×3 IMPLANT
DRSG AQUACEL AG ADV 3.5X10 (GAUZE/BANDAGES/DRESSINGS) ×3 IMPLANT
DRSG EMULSION OIL 3X3 NADH (GAUZE/BANDAGES/DRESSINGS) ×3 IMPLANT
ELECT REM PT RETURN 9FT ADLT (ELECTROSURGICAL) ×3
ELECTRODE REM PT RTRN 9FT ADLT (ELECTROSURGICAL) ×1 IMPLANT
GAUZE SPONGE 4X4 12PLY STRL (GAUZE/BANDAGES/DRESSINGS) ×3 IMPLANT
GLOVE BIOGEL PI IND STRL 8 (GLOVE) ×2 IMPLANT
GLOVE BIOGEL PI INDICATOR 8 (GLOVE) ×4
GLOVE ORTHO TXT STRL SZ7.5 (GLOVE) ×12 IMPLANT
GOWN STRL REUS W/ TWL LRG LVL3 (GOWN DISPOSABLE) ×1 IMPLANT
GOWN STRL REUS W/ TWL XL LVL3 (GOWN DISPOSABLE) ×1 IMPLANT
GOWN STRL REUS W/TWL 2XL LVL3 (GOWN DISPOSABLE) ×3 IMPLANT
GOWN STRL REUS W/TWL LRG LVL3 (GOWN DISPOSABLE) ×2
GOWN STRL REUS W/TWL XL LVL3 (GOWN DISPOSABLE) ×2
K-WIRE 2X5 SS THRDED S3 (WIRE) ×9
KIT BASIN OR (CUSTOM PROCEDURE TRAY) ×3 IMPLANT
KIT ROOM TURNOVER OR (KITS) ×3 IMPLANT
KWIRE 2X5 SS THRDED S3 (WIRE) ×3 IMPLANT
MANIFOLD NEPTUNE II (INSTRUMENTS) ×3 IMPLANT
NEEDLE HYPO 25GX1X1/2 BEV (NEEDLE) IMPLANT
NS IRRIG 1000ML POUR BTL (IV SOLUTION) ×3 IMPLANT
PACK SHOULDER (CUSTOM PROCEDURE TRAY) ×3 IMPLANT
PACK UNIVERSAL I (CUSTOM PROCEDURE TRAY) ×3 IMPLANT
PAD ARMBOARD 7.5X6 YLW CONV (MISCELLANEOUS) ×6 IMPLANT
PEG LOCKING 3.2MMX46 (Peg) ×9 IMPLANT
PEG LOCKING 3.2X32 (Peg) ×3 IMPLANT
PEG LOCKING 3.2X34 (Screw) ×3 IMPLANT
PEG LOCKING 3.2X36 (Screw) ×3 IMPLANT
PEG LOCKING 3.2X52 (Peg) ×3 IMPLANT
PEG LOCKING 3.2X58MM (Peg) ×3 IMPLANT
PLATE PROX HUMERUS 4H LEFT LOW (Plate) ×3 IMPLANT
SCREW LOW PROF TIS 3.5X28MM (Screw) ×3 IMPLANT
SCREW LP NL T15 3.5X24 (Screw) ×3 IMPLANT
SCREW LP NL T15 3.5X26 (Screw) ×6 IMPLANT
SLEEVE MEASURING 3.2 (BIT) ×3 IMPLANT
SPONGE LAP 18X18 X RAY DECT (DISPOSABLE) ×6 IMPLANT
STRIP CLOSURE SKIN 1/2X4 (GAUZE/BANDAGES/DRESSINGS) ×2 IMPLANT
SUCTION FRAZIER TIP 10 FR DISP (SUCTIONS) ×6 IMPLANT
SUT FIBERWIRE #2 38 T-5 BLUE (SUTURE)
SUT VIC AB 2-0 CT1 27 (SUTURE) ×4
SUT VIC AB 2-0 CT1 TAPERPNT 27 (SUTURE) ×2 IMPLANT
SUT VIC AB 3-0 FS2 27 (SUTURE) ×3 IMPLANT
SUTURE FIBERWR #2 38 T-5 BLUE (SUTURE) IMPLANT
SYR CONTROL 10ML LL (SYRINGE) IMPLANT
TOWEL OR 17X24 6PK STRL BLUE (TOWEL DISPOSABLE) ×3 IMPLANT
WATER STERILE IRR 1000ML POUR (IV SOLUTION) ×3 IMPLANT

## 2015-04-10 NOTE — H&P (Signed)
Yvonne [name] is an 81 y.o. female.    REASON FOR VISIT:  Left proximal humerus fracture.   HISTORY OF PRESENT ILLNESS:  Yvonne [name] is an 81 year old female, right-hand dominant, who had a ground-level fall on 04/06/2015 and sustained a left surgical neck humerus fracture.  She was evaluated in the [facility] ER, and x-rays showed a displaced surgical neck fracture.  She was placed in a sling and given followup.  She is right now taking Zofran and Percocet.  She says the pain is severe.  The pain is worse with any sort of movement, better with immobilization.  It does not radiate.  It is in her shoulder.  It started on 04/06/2015.  Everything bothers her.   CURRENT MEDICATIONS:  Aspirin, Zoloft, propafenone, estradiol, Centrum.   ALLERGIES:  None.   PAST SURGICAL HISTORY:  None.   PAST MEDICAL HISTORY:  Anxiety, depression, atrial fibrillation.   SOCIAL HISTORY:  Married, lives at home with her husband.  Does not smoke, does not drink.   FAMILY HISTORY:  Atrial fibrillation.        Past Medical History  Diagnosis Date  . Hemorrhage of gastrointestinal tract, unspecified   . Anxiety state, unspecified   . Left ventricular systolic dysfunction     hx of mild  . History of peptic ulcer   . Esophageal reflux   . Mitral valve insufficiency and aortic valve insufficiency   . Palpitations     history  . Other primary cardiomyopathies   . Wears glasses   . Other left bundle branch block   . Paroxysmal supraventricular tachycardia   . LBBB (left bundle branch block)     Past Surgical History  Procedure Laterality Date  . Esophagogastroduodenoscopy    . Tonsillectomy    . Appendectomy    . Abdominal hysterectomy    . Tubal ligation    . Knee arthroscopy  2009    left  . Breast surgery      biopsy  . Open reduction internal fixation (orif) metacarpal Left 11/05/2013    Procedure: CLOSED REDUCTION/PINNING VS OPEN REDUCTION INTERNAL FIXATION (ORIF) LEFT SMALL  METACARPAL FRACTURE  ;  Surgeon: Cammie Sickle., MD;  Location: [place];  Service: Orthopedics;  Laterality: Left;    Family History  Problem Relation Age of Onset  . Hypertension Mother    Social History:  reports that she has never smoked. She has never used smokeless tobacco. She reports that she does not drink alcohol or use illicit drugs.  Allergies:  Allergies  Allergen Reactions  . Aleve [Naproxen Sodium]     Gi bleed  . Aspirin     In high doses    No prescriptions prior to admission    No results found for this or any previous visit (from the past 48 hour(s)). No results found.  ROS REVIEW OF SYSTEMS:  Positive for atrial fibrillation but does not take anything else but a baby aspirin.    There were no vitals taken for this visit. Physical Exam  PHYSICAL EXAMINATION:  Well developed, well nourished, no acute distress, alert and oriented x3.  Normal judgment, insight, and affect.  Nonlabored breathing.  Abdomen:  Soft.  Left Shoulder:  Shows swelling and bruising over the proximal humerus and shoulder region.  The skin is intact.  She is neurovascularly intact.  Axillary nerve is intact.  She does not have much movement secondary to pain.  Sensation is intact.  Bounding pulses  distally.   RADIOGRAPHS/TEST:  Two-view x-rays of the left shoulder from [place] show a anteriorly displaced surgical neck fracture.  The shoulder is located.  The shaft is anterior relative to the humeral head.   ASSESSMENT/DIAGNOSIS:  An 81 year old female with a displaced left surgical neck humerus fracture.   PLAN:  Reviewed the x-rays with the patient and discussed surgical versus nonsurgical options.  Surgery is recommended.  We discussed the risks of surgery including nerve damage, nonunion, collapse, hardware failure, possible need for additional surgery, incomplete relief of pain, and stiffness.  Patient understands the risks, and she wishes to proceed.  I am going to have my partner, Dr.  Lorin Mercy, talk to them about getting her on for surgery next week.  Please also refer to his note from today.  [name] 04/10/2015, 11:37 AM

## 2015-04-10 NOTE — Anesthesia Preprocedure Evaluation (Addendum)
Anesthesia Evaluation  Patient identified by MRN, date of birth, ID band Patient awake    Reviewed: Allergy & Precautions, NPO status , Patient's Chart, lab work & pertinent test results  History of Anesthesia Complications Negative for: history of anesthetic complications  Airway Mallampati: II  TM Distance: <3 FB Neck ROM: Full    Dental  (+) Dental Advisory Given, Teeth Intact   Pulmonary  breath sounds clear to auscultation        Cardiovascular - angina- Past MI and - CHF + dysrhythmias Supra Ventricular Tachycardia Rhythm:Regular     Neuro/Psych PSYCHIATRIC DISORDERS Anxiety    GI/Hepatic Neg liver ROS, GERD-  Medicated,  Endo/Other  negative endocrine ROS  Renal/GU negative Renal ROS     Musculoskeletal  (+) Arthritis -,   Abdominal   Peds  Hematology negative hematology ROS (+)   Anesthesia Other Findings   Reproductive/Obstetrics                            Anesthesia Physical Anesthesia Plan  ASA: III  Anesthesia Plan: General and Regional   Post-op Pain Management:    Induction: Intravenous  Airway Management Planned: Oral ETT  Additional Equipment: None  Intra-op Plan:   Post-operative Plan: Extubation in OR  Informed Consent: I have reviewed the patients History and Physical, chart, labs and discussed the procedure including the risks, benefits and alternatives for the proposed anesthesia with the patient or authorized representative who has indicated his/her understanding and acceptance.   Dental advisory given  Plan Discussed with: CRNA, Anesthesiologist and Surgeon  Anesthesia Plan Comments:        Anesthesia Quick Evaluation

## 2015-04-10 NOTE — Anesthesia Postprocedure Evaluation (Signed)
  Anesthesia Post-op Note  Patient: Yvonne [name]  Procedure(s) Performed: Procedure(s): OPEN REDUCTION INTERNAL FIXATION (ORIF) PROXIMAL HUMERUS FRACTURE (Left)  Patient Location: PACU  Anesthesia Type: General, Regional   Level of Consciousness: awake, alert  and oriented  Airway and Oxygen Therapy: Patient Spontanous Breathing  Post-op Pain: none  Post-op Assessment: Post-op Vital signs reviewed  Post-op Vital Signs: Reviewed  Last Vitals:  Filed Vitals:   04/10/15 1853  BP: 123/49  Pulse: 70  Temp: 37.2 C  Resp: 16    Complications: No apparent anesthesia complications

## 2015-04-10 NOTE — Anesthesia Procedure Notes (Addendum)
Anesthesia Regional Block:  Interscalene brachial plexus block  Pre-Anesthetic Checklist: ,, timeout performed, Correct Patient, Correct Site, Correct Laterality, Correct Procedure, Correct Position, site marked, Risks and benefits discussed,  Surgical consent,  Pre-op evaluation,  At surgeon's request and post-op pain management  Laterality: Upper and Left  Prep: chloraprep       Needles:  Injection technique: Single-shot  Needle Type: Echogenic Stimulator Needle          Additional Needles:  Procedures: ultrasound guided (picture in chart) and nerve stimulator Interscalene brachial plexus block  Nerve Stimulator or Paresthesia:  Response: deltoid, 0.5 mA,   Additional Responses:   Narrative:  Injection made incrementally with aspirations every 5 mL.  Performed by: Personally  Anesthesiologist: MOSER, CHRIS  Additional Notes: H+P and labs reviewed, risks and benefits discussed with patient, procedure tolerated well without complications   Procedure Name: Intubation Date/Time: 04/10/2015 3:14 PM Performed by: Garrison Columbus T Pre-anesthesia Checklist: Patient identified, Emergency Drugs available, Suction available and Patient being monitored Patient Re-evaluated:Patient Re-evaluated prior to inductionOxygen Delivery Method: Circle system utilized Preoxygenation: Pre-oxygenation with 100% oxygen Intubation Type: IV induction Ventilation: Mask ventilation without difficulty Laryngoscope Size: 2 and Miller Grade View: Grade I Tube type: Oral Tube size: 7.5 mm Number of attempts: 1 Airway Equipment and Method: Stylet Placement Confirmation: ETT inserted through vocal cords under direct vision,  positive ETCO2 and breath sounds checked- equal and bilateral Secured at: 22 cm Tube secured with: Tape Dental Injury: Teeth and Oropharynx as per pre-operative assessment

## 2015-04-10 NOTE — Transfer of Care (Signed)
Immediate Anesthesia Transfer of Care Note  Patient: Yvonne [name]  Procedure(s) Performed: Procedure(s): OPEN REDUCTION INTERNAL FIXATION (ORIF) PROXIMAL HUMERUS FRACTURE (Left)  Patient Location: PACU  Anesthesia Type:General and regional  Level of Consciousness: awake and alert   Airway & Oxygen Therapy: Patient Spontanous Breathing and Patient connected to nasal cannula oxygen  Post-op Assessment: Report given to RN and Post -op Vital signs reviewed and stable  Post vital signs: Reviewed and stable  Last Vitals:  Filed Vitals:   04/10/15 1305  BP: 138/71  Pulse: 78  Temp: 36.7 C  Resp: 20    Complications: No apparent anesthesia complications

## 2015-04-10 NOTE — Brief Op Note (Signed)
04/10/2015  4:39 PM  PATIENT:  [name]  81 y.o. female  PRE-OPERATIVE DIAGNOSIS:  LEFT PROXIMAL HUMERAL FRACTURE  POST-OPERATIVE DIAGNOSIS:  LEFT PROXIMAL HUMERAL FRACTURE  PROCEDURE:  Procedure(s): OPEN REDUCTION INTERNAL FIXATION (ORIF) PROXIMAL HUMERUS FRACTURE (Left)  SURGEON:  Surgeon(s) and Role:    * Marybelle Killings, MD - Primary  PHYSICIAN ASSISTANT: Jeani Fassnacht m. Malone Admire pa-c    ANESTHESIA:   general  EBL:  Total I/O In: 2000 [I.V.:2000] Out: 100 [Blood:100]  BLOOD ADMINISTERED:none  DRAINS: none   LOCAL MEDICATIONS USED:  NONE  SPECIMEN:  No Specimen  DISPOSITION OF SPECIMEN:  N/A  COUNTS:  YES  TOURNIQUET:  * No tourniquets in log *   PATIENT DISPOSITION:  PACU - hemodynamically stable.

## 2015-04-11 ENCOUNTER — Encounter (HOSPITAL_COMMUNITY): Payer: Self-pay | Admitting: Orthopaedic Surgery

## 2015-04-11 LAB — BASIC METABOLIC PANEL
Anion gap: 10 (ref 5–15)
BUN: 13 mg/dL (ref 6–20)
CALCIUM: 9.1 mg/dL (ref 8.9–10.3)
CO2: 26 mmol/L (ref 22–32)
CREATININE: 0.67 mg/dL (ref 0.44–1.00)
Chloride: 102 mmol/L (ref 101–111)
GFR calc Af Amer: >60 mL/min (ref >60)
GFR calc non Af Amer: >60 mL/min (ref >60)
GLUCOSE: 106 mg/dL — AB (ref 65–99)
Potassium: 4.3 mmol/L (ref 3.5–5.1)
Sodium: 138 mmol/L (ref 135–145)

## 2015-04-11 LAB — CBC
HCT: 32.9 % — ABNORMAL LOW (ref 36.0–46.0)
Hemoglobin: 10.9 g/dL — ABNORMAL LOW (ref 12.0–15.0)
MCH: 30.3 pg (ref 26.0–34.0)
MCHC: 33.1 g/dL (ref 30.0–36.0)
MCV: 91.4 fL (ref 78.0–100.0)
PLATELETS: 411 10*3/uL — AB (ref 150–400)
RBC: 3.6 MIL/uL — ABNORMAL LOW (ref 3.87–5.11)
RDW: 14.2 % (ref 11.5–15.5)
WBC: 11.2 10*3/uL — ABNORMAL HIGH (ref 4.0–10.5)

## 2015-04-11 MED ORDER — MAGNESIUM CITRATE PO SOLN
0.5000 | Freq: Once | ORAL | Status: AC
Start: 2015-04-11 — End: 2015-04-11
  Administered 2015-04-11: 0.5 via ORAL
  Filled 2015-04-11: qty 296

## 2015-04-11 MED ORDER — OXYCODONE HCL 5 MG PO TABS
10.0000 mg | ORAL_TABLET | ORAL | Status: DC | PRN
  Administered 2015-04-11 – 2015-04-12 (×5): 10 mg via ORAL
  Filled 2015-04-11 (×5): qty 2

## 2015-04-11 MED ORDER — FLEET ENEMA 7-19 GM/118ML RE ENEM
1.0000 | ENEMA | Freq: Once | RECTAL | Status: AC
  Administered 2015-04-11: 1 via RECTAL
  Filled 2015-04-11: qty 1

## 2015-04-11 NOTE — Progress Notes (Addendum)
Subjective: 1 Day Post-Op Procedure(s) (LRB): OPEN REDUCTION INTERNAL FIXATION (ORIF) PROXIMAL HUMERUS FRACTURE (Left) Patient reports pain as 8 out of 10.    .    Objective: Vital signs in last 24 hours: Temp:  [98 F (36.7 C)-99 F (37.2 C)] 99 F (37.2 C) (06/14 0625) Pulse Rate:  [66-105] 71 (06/14 0625) Resp:  [16-21] 16 (06/13 1853) BP: (123-147)/(49-80) 126/55 mmHg (06/14 0625) SpO2:  [98 %-100 %] 100 % (06/14 0625) Weight:  [58.968 kg (130 lb)] 58.968 kg (130 lb) (06/13 1305)  Intake/Output from previous day: 06/13 0701 - 06/14 0700 In: 2564 [I.V.:2564] Out: 100 [Blood:100] Intake/Output this shift:     Recent Labs  04/11/15 0530  HGB 10.9*    Recent Labs  04/11/15 0530  WBC 11.2*  RBC 3.60*  HCT 32.9*  PLT 411*    Recent Labs  04/11/15 0530  NA 138  K 4.3  CL 102  CO2 26  BUN 13  CREATININE 0.67  GLUCOSE 106*  CALCIUM 9.1   No results for input(s): LABPT, INR in the last 72 hours.  Neurologically intact  Assessment/Plan: 1 Day Post-Op Procedure(s) (LRB): OPEN REDUCTION INTERNAL FIXATION (ORIF) PROXIMAL HUMERUS FRACTURE (Left) Up with therapy  . Block wore off last night.  Will change pain meds to q4hrs.   Yvonne [name] C 04/11/2015, 7:43 AM

## 2015-04-11 NOTE — Op Note (Signed)
NAMEAHRIANA, GUNKEL NO.:  [id number]  MEDICAL RECORD NO.:  [id number]  LOCATION:  [id number]                        FACILITY:  [facility]  PHYSICIAN:  Esmirna Ravan C. Lorin Mercy, M.D.    DATE OF BIRTH:  September 13, 1929  DATE OF PROCEDURE:  04/10/2015 DATE OF DISCHARGE:                              OPERATIVE REPORT   PREOPERATIVE DIAGNOSIS:  Displaced left proximal humerus fracture.  POSTOPERATIVE DIAGNOSIS:  Displaced left proximal humerus fracture.  PROCEDURE:  Open reduction and internal fixation of left proximal humerus.  IMPLANTS:  Biomet titanium anatomic locking plate with pegs and nonlocking screws.  SURGEON:  Aleck Locklin C. Lorin Mercy, MD  ASSISTANT:  Benjiman Core, PA-C medically necessary and present for the entire procedure.  ESTIMATED BLOOD LOSS:  100 mL.  DRAINS:  None.  DESCRIPTION OF PROCEDURE:  After induction of general anesthesia, standard prepping and draping, standard beach chair positioning was used checking the position of the head.  A strap was placed across the chest and DuraPrep was used down to the wrist.  Impervious stockinette, Coban split sheets, drapes, sterile skin marker, Betadine, Steri-Drape sealing to the skin was all applied.  Time-out procedure with Ancef prophylaxis. Deltopectoral incision made and taken laterally.  Since this was metaphyseal fracture, transfers with comminution of the medial calcar of the neck of the humerus a traction has to be applied, shoulder was flexed up distracted, the head retractors slowed over the top of the rotator cuff, and then shoulder reduced, held with a K-wire from proximal posterior and stuck down the shaft.  The plate was selected and placed.  Initially, the center pin was drilled up the head had to be removed pushed more proximal and then inserted, it was in good position. The distal most screw was fixed to the shaft and then sequential filling of the proximal screws with power drilling initially, hand drilling  if necessary using a depth gauge, checking under fluoroscopy for length and then placement of smooth pegs.  All peg holes were filled.  Lateral was checked before filling. Good position and alignment AP and lateral. There was still slight comminution of the medial calcar.  The patient did have intact axillary nerve preoperatively.  The plate was sitting lateral to the biceps just up with tuberosity flare.  All distal bicortical screws were filled.  Spinal spot pictures were taken.  There were 4 bicortical screws distally and 7 right proximal pegs.  The patient tolerated the procedure well.  After irrigation, standard closure with 2-0 in subcutaneous tissue, skin staple closure, postop dressing, and re-application of shoulder immobilizer.  The patient will at least be overnight obs.     Rena Hunke C. Lorin Mercy, M.D.   ______________________________ Thana Farr. Lorin Mercy, M.D.    MCY/MEDQ  D:  04/10/2015  T:  04/11/2015  Job:  [id number]

## 2015-04-11 NOTE — Care Management (Signed)
Utilization review completed by Kedra Mcglade N. Norville Dani, RN BSN 

## 2015-04-12 MED ORDER — OXYCODONE-ACETAMINOPHEN 5-325 MG PO TABS: 2.0000 | ORAL_TABLET | ORAL | Status: DC | PRN

## 2015-04-12 NOTE — Progress Notes (Signed)
Subjective: 2 Days Post-Op Procedure(s) (LRB): OPEN REDUCTION INTERNAL FIXATION (ORIF) PROXIMAL HUMERUS FRACTURE (Left) Patient reports pain as mild.    Objective: Vital signs in last 24 hours: Temp:  [98.2 F (36.8 C)-98.6 F (37 C)] 98.2 F (36.8 C) (06/15 0517) Pulse Rate:  [83-88] 88 (06/15 0517) Resp:  [16-18] 16 (06/15 0517) BP: (117-135)/(56-63) 117/63 mmHg (06/15 0517) SpO2:  [94 %-98 %] 94 % (06/15 0517)  Intake/Output from previous day: 06/14 0701 - 06/15 0700 In: 120 [P.O.:120] Out: -  Intake/Output this shift:     Recent Labs  04/11/15 0530  HGB 10.9*    Recent Labs  04/11/15 0530  WBC 11.2*  RBC 3.60*  HCT 32.9*  PLT 411*    Recent Labs  04/11/15 0530  NA 138  K 4.3  CL 102  CO2 26  BUN 13  CREATININE 0.67  GLUCOSE 106*  CALCIUM 9.1   No results for input(s): LABPT, INR in the last 72 hours.  Neurologically intact  Assessment/Plan: 2 Days Post-Op Procedure(s) (LRB): OPEN REDUCTION INTERNAL FIXATION (ORIF) PROXIMAL HUMERUS FRACTURE (Left) Up with therapy, discharge home.  Office one week. rx for percocet.   Benigno Check C 04/12/2015, 7:38 AM

## 2015-04-12 NOTE — Discharge Instructions (Signed)
OK to shower, arm across chest. Dry off and reapply sling. Office one week. Ice on and off for 24 to 72 hrs as needed

## 2015-04-17 ENCOUNTER — Encounter (HOSPITAL_COMMUNITY): Payer: Self-pay | Admitting: Orthopaedic Surgery

## 2015-04-17 ENCOUNTER — Other Ambulatory Visit: Payer: Self-pay | Admitting: Internal Medicine

## 2015-04-19 NOTE — Discharge Summary (Signed)
Patient ID: Yvonne [name] MRN: [id number] DOB/AGE: [birth year] 81 y.o.  Admit date: 04/10/2015 Discharge date: 04/19/2015  Admission Diagnoses:  Active Problems:   Fracture, humerus, proximal   Discharge Diagnoses:  Active Problems:   Fracture, humerus, proximal  status post Procedure(s): OPEN REDUCTION INTERNAL FIXATION (ORIF) PROXIMAL HUMERUS FRACTURE  Past Medical History  Diagnosis Date  . Hemorrhage of gastrointestinal tract, unspecified   . Anxiety state, unspecified   . Left ventricular systolic dysfunction     hx of mild  . History of peptic ulcer   . Esophageal reflux   . Mitral valve insufficiency and aortic valve insufficiency   . Palpitations     history  . Other primary cardiomyopathies   . Wears glasses   . Other left bundle branch block   . Paroxysmal supraventricular tachycardia   . LBBB (left bundle branch block)   . Cataract   . Cancer     basil cell carcinomas removed  . Arthritis     Surgeries: Procedure(s): OPEN REDUCTION INTERNAL FIXATION (ORIF) PROXIMAL HUMERUS FRACTURE on 04/10/2015   Consultants:    Discharged Condition: Improved  Hospital Course: Yvonne [name] is an 81 y.o. female who was admitted 04/10/2015 for operative treatment of proximal humerus fracture. Patient failed conservative treatments (please see the history and physical for the specifics) and had severe unremitting pain that affects sleep, daily activities and work/hobbies. After pre-op clearance, the patient was taken to the operating room on 04/10/2015 and underwent  Procedure(s): OPEN REDUCTION INTERNAL FIXATION (ORIF) PROXIMAL HUMERUS FRACTURE.    Patient was given perioperative antibiotics:  Anti-infectives    Start     Dose/Rate Route Frequency Ordered Stop   04/10/15 1345  ceFAZolin (ANCEF) IVPB 2 g/50 mL premix     2 g 100 mL/hr over 30 Minutes Intravenous  Once 04/10/15 1334 04/10/15 1525   04/10/15 1335  ceFAZolin (ANCEF) 2-3 GM-% IVPB SOLR    Comments:   Ancil Boozer  : cabinet override      04/10/15 1335 04/11/15 0144       Patient was given sequential compression devices and early ambulation to prevent DVT.   Patient benefited maximally from hospital stay and there were no complications. At the time of discharge, the patient was urinating/moving their bowels without difficulty, tolerating a regular diet, pain is controlled with oral pain medications and they have been cleared by PT/OT.   Recent vital signs: No data found.    Recent laboratory studies: No results for input(s): WBC, HGB, HCT, PLT, NA, K, CL, CO2, BUN, CREATININE, GLUCOSE, INR, CALCIUM in the last 72 hours.  Invalid input(s): PT, 2   Discharge Medications:     Medication List    TAKE these medications        aspirin EC 81 MG tablet  Take 1 tablet (81 mg total) by mouth daily.     CAL-MAG-ZINC PO  Take 1 tablet by mouth daily.     CENTRUM SILVER PO  Take 1 tablet by mouth daily.     clorazepate 7.5 MG tablet  Commonly known as:  TRANXENE  Take 7.5 mg by mouth 2 (two) times daily as needed for anxiety.     docusate sodium 100 MG capsule  Commonly known as:  COLACE  Take 100 mg by mouth 2 (two) times daily.     estradiol 1 MG tablet  Commonly known as:  ESTRACE  Take 0.5 mg by mouth daily.     fluticasone 50  MCG/ACT nasal spray  Commonly known as:  FLONASE  Place 1 spray into both nostrils daily as needed for allergies.     ondansetron 4 MG tablet  Commonly known as:  ZOFRAN  Take 1 tablet (4 mg total) by mouth every 8 (eight) hours as needed for nausea or vomiting.     oxyCODONE-acetaminophen 5-325 MG per tablet  Commonly known as:  ROXICET  Take 2 tablets by mouth every 4 (four) hours as needed for severe pain.     propafenone 150 MG tablet  Commonly known as:  RYTHMOL  Take 150 mg by mouth 2 (two) times daily before lunch and supper.     sertraline 25 MG tablet  Commonly known as:  ZOLOFT  Take 75 mg by mouth daily.     traMADol 50  MG tablet  Commonly known as:  ULTRAM  Take 50 mg by mouth every 6 (six) hours as needed for moderate pain or severe pain.     vitamin C 500 MG tablet  Commonly known as:  ASCORBIC ACID  Take 500 mg by mouth daily.        Diagnostic Studies: Dg Chest 1 View  04/06/2015   CLINICAL DATA:  Full a supermarket today. Deformity of the shoulder.  EXAM: CHEST  1 VIEW  COMPARISON:  Two-view chest x-ray 09/13/2010  FINDINGS: The heart is mildly enlarged. Atherosclerotic changes are present in the aorta. Mild left basilar atelectasis is present. The lungs are otherwise clear. The proximal left humerus fracture is noted.  IMPRESSION: 1. Proximal left humerus fracture. 2. Stable cardiomegaly without failure. 3. Mild left basilar atelectasis.   Electronically Signed   By: San Morelle M.D.   On: 04/06/2015 15:29   Ct Head Wo Contrast  04/06/2015   CLINICAL DATA:  Fall striking LEFT shoulder, history state syncope but patient denies loss of consciousness, past history of arrhythmia and LEFT ventricular systolic dysfunction  EXAM: CT HEAD WITHOUT CONTRAST  CT CERVICAL SPINE WITHOUT CONTRAST  TECHNIQUE: Multidetector CT imaging of the head and cervical spine was performed following the standard protocol without intravenous contrast. Multiplanar CT image reconstructions of the cervical spine were also generated.  COMPARISON:  None  FINDINGS: CT HEAD FINDINGS  Generalized atrophy.  Normal ventricular morphology.  No midline shift or mass effect.  Small vessel chronic ischemic changes of deep cerebral white matter.  No intracranial hemorrhage, mass lesion, or acute infarction.  Visualized paranasal sinuses and mastoid air cells clear.  Bones unremarkable.  Atherosclerotic calcifications at the carotid siphons bilaterally.  CT CERVICAL SPINE FINDINGS  Osseous demineralization.  Prevertebral soft tissues normal thickness.  Visualized skullbase intact.  Vertebral body heights maintained.  Disc space narrowing C5-C6,  C6-C7, less C4-C5.  Minimal retrolisthesis C4-C5 and anterolisthesis C3-C4.  Scattered multilevel facet degenerative changes.  No acute fracture, subluxation, or bone destruction.  Lung apices clear.  Atherosclerotic calcifications at BILATERAL carotid bifurcations.  IMPRESSION: Atrophy with small vessel chronic ischemic changes of deep cerebral white matter.  No acute intracranial abnormalities.  Degenerative disc and facet disease changes cervical spine.  No acute cervical spine abnormalities.  Atherosclerotic calcifications in the carotid systems bilaterally.   Electronically Signed   By: Lavonia Dana M.D.   On: 04/06/2015 16:58   Ct Cervical Spine Wo Contrast  04/06/2015   CLINICAL DATA:  Fall striking LEFT shoulder, history state syncope but patient denies loss of consciousness, past history of arrhythmia and LEFT ventricular systolic dysfunction  EXAM: CT HEAD WITHOUT CONTRAST  CT CERVICAL SPINE WITHOUT CONTRAST  TECHNIQUE: Multidetector CT imaging of the head and cervical spine was performed following the standard protocol without intravenous contrast. Multiplanar CT image reconstructions of the cervical spine were also generated.  COMPARISON:  None  FINDINGS: CT HEAD FINDINGS  Generalized atrophy.  Normal ventricular morphology.  No midline shift or mass effect.  Small vessel chronic ischemic changes of deep cerebral white matter.  No intracranial hemorrhage, mass lesion, or acute infarction.  Visualized paranasal sinuses and mastoid air cells clear.  Bones unremarkable.  Atherosclerotic calcifications at the carotid siphons bilaterally.  CT CERVICAL SPINE FINDINGS  Osseous demineralization.  Prevertebral soft tissues normal thickness.  Visualized skullbase intact.  Vertebral body heights maintained.  Disc space narrowing C5-C6, C6-C7, less C4-C5.  Minimal retrolisthesis C4-C5 and anterolisthesis C3-C4.  Scattered multilevel facet degenerative changes.  No acute fracture, subluxation, or bone destruction.   Lung apices clear.  Atherosclerotic calcifications at BILATERAL carotid bifurcations.  IMPRESSION: Atrophy with small vessel chronic ischemic changes of deep cerebral white matter.  No acute intracranial abnormalities.  Degenerative disc and facet disease changes cervical spine.  No acute cervical spine abnormalities.  Atherosclerotic calcifications in the carotid systems bilaterally.   Electronically Signed   By: Lavonia Dana M.D.   On: 04/06/2015 16:58   Dg Shoulder Left  04/10/2015   CLINICAL DATA:  81 year old female with left humerus fracture post treatment. Subsequent encounter.  EXAM: LEFT SHOULDER - 2+ VIEW; DG C-ARM 61-120 MIN  COMPARISON:  04/06/2015.  Fluoroscopic time 41 seconds.  Three C-arm images obtained.  FINDINGS: Three intraoperative C-arm views submitted for review after surgery.  Sideplate and screws utilized to transfix comminuted left surgical neck fracture. The fracture fragments, although better aligned, are still slightly separated (half shaft width).  IMPRESSION: Sideplate and screws utilized to transfix comminuted left surgical neck fracture. The fracture fragments, although better aligned, are still slightly separated (half shaft width).   Electronically Signed   By: Genia Del M.D.   On: 04/10/2015 16:45   Dg Shoulder Left  04/06/2015   CLINICAL DATA:  Bruising and pain of the left shoulder after after a fall in the supermarket today.  EXAM: LEFT SHOULDER - 2+ VIEW  COMPARISON:  None.  FINDINGS: There is a comminuted and markedly displaced angulated fracture of the left humeral neck. The articular surface of the humerus is intact. No dislocation.  IMPRESSION: Comminuted markedly displaced fracture of the proximal left humerus.   Electronically Signed   By: Lorriane Shire M.D.   On: 04/06/2015 15:28   Dg Humerus Left  04/06/2015   CLINICAL DATA:  Fall at a supermarket today with left shoulder injury and deformity. Initial encounter.  EXAM: LEFT HUMERUS - 2+ VIEW  COMPARISON:   Shoulder films on the same date.  FINDINGS: Comminuted and displaced fracture is identified of the proximal left humerus through the surgical neck. The humeral shaft is displaced medially relative to the humeral head by approximately 1 shaft width. No dislocation. Overlying soft tissue swelling/ hematoma present. No bony lesions are seen.  IMPRESSION: Comminuted and displaced surgical neck fracture of the proximal left humerus.   Electronically Signed   By: Aletta Edouard M.D.   On: 04/06/2015 15:37   Dg C-arm 1-60 Min  04/10/2015   CLINICAL DATA:  81 year old female with left humerus fracture post treatment. Subsequent encounter.  EXAM: LEFT SHOULDER - 2+ VIEW; DG C-ARM 61-120 MIN  COMPARISON:  04/06/2015.  Fluoroscopic time 41 seconds.  Three C-arm  images obtained.  FINDINGS: Three intraoperative C-arm views submitted for review after surgery.  Sideplate and screws utilized to transfix comminuted left surgical neck fracture. The fracture fragments, although better aligned, are still slightly separated (half shaft width).  IMPRESSION: Sideplate and screws utilized to transfix comminuted left surgical neck fracture. The fracture fragments, although better aligned, are still slightly separated (half shaft width).   Electronically Signed   By: Genia Del M.D.   On: 04/10/2015 16:45          Follow-up Information    Follow up with YATES,MARK C, MD In 1 week.   Specialty:  Orthopedic Surgery   Contact information:   [address] [place] [address] [phone]       Discharge Plan:  discharge to home  Disposition:     Signed: Lanae Crumbly  04/19/2015, 7:27 AM

## 2015-04-23 ENCOUNTER — Emergency Department (HOSPITAL_COMMUNITY)
Admission: EM | Admit: 2015-04-23 | Discharge: 2015-04-24 | Disposition: A | Discharge status: Home / Self Care | Payer: Medicare Other | Attending: Emergency Medicine | Admitting: Emergency Medicine

## 2015-04-23 ENCOUNTER — Emergency Department (HOSPITAL_COMMUNITY): Payer: Medicare Other

## 2015-04-23 ENCOUNTER — Encounter (HOSPITAL_COMMUNITY): Payer: Self-pay | Admitting: *Deleted

## 2015-04-23 DIAGNOSIS — R1084 Generalized abdominal pain: Secondary | ICD-10-CM | POA: Diagnosis not present

## 2015-04-23 DIAGNOSIS — Z79899 Other long term (current) drug therapy: Secondary | ICD-10-CM | POA: Insufficient documentation

## 2015-04-23 DIAGNOSIS — Z85828 Personal history of other malignant neoplasm of skin: Secondary | ICD-10-CM | POA: Diagnosis not present

## 2015-04-23 DIAGNOSIS — Z7951 Long term (current) use of inhaled steroids: Secondary | ICD-10-CM | POA: Diagnosis not present

## 2015-04-23 DIAGNOSIS — Z9851 Tubal ligation status: Secondary | ICD-10-CM | POA: Diagnosis not present

## 2015-04-23 DIAGNOSIS — Z7982 Long term (current) use of aspirin: Secondary | ICD-10-CM | POA: Diagnosis not present

## 2015-04-23 DIAGNOSIS — M1990 Unspecified osteoarthritis, unspecified site: Secondary | ICD-10-CM | POA: Insufficient documentation

## 2015-04-23 DIAGNOSIS — Z8679 Personal history of other diseases of the circulatory system: Secondary | ICD-10-CM | POA: Diagnosis not present

## 2015-04-23 DIAGNOSIS — Z8711 Personal history of peptic ulcer disease: Secondary | ICD-10-CM | POA: Diagnosis not present

## 2015-04-23 DIAGNOSIS — F411 Generalized anxiety disorder: Secondary | ICD-10-CM | POA: Diagnosis not present

## 2015-04-23 DIAGNOSIS — Z9849 Cataract extraction status, unspecified eye: Secondary | ICD-10-CM | POA: Insufficient documentation

## 2015-04-23 DIAGNOSIS — Z8719 Personal history of other diseases of the digestive system: Secondary | ICD-10-CM | POA: Insufficient documentation

## 2015-04-23 DIAGNOSIS — Z90710 Acquired absence of both cervix and uterus: Secondary | ICD-10-CM | POA: Insufficient documentation

## 2015-04-23 DIAGNOSIS — R112 Nausea with vomiting, unspecified: Secondary | ICD-10-CM | POA: Diagnosis present

## 2015-04-23 DIAGNOSIS — R109 Unspecified abdominal pain: Secondary | ICD-10-CM

## 2015-04-23 DIAGNOSIS — Z9089 Acquired absence of other organs: Secondary | ICD-10-CM | POA: Insufficient documentation

## 2015-04-23 LAB — COMPREHENSIVE METABOLIC PANEL
ALT: 12 U/L — AB (ref 14–54)
AST: 17 U/L (ref 15–41)
Albumin: 3.1 g/dL — ABNORMAL LOW (ref 3.5–5.0)
Alkaline Phosphatase: 103 U/L (ref 38–126)
Anion gap: 11 (ref 5–15)
BILIRUBIN TOTAL: 0.3 mg/dL (ref 0.3–1.2)
BUN: 15 mg/dL (ref 6–20)
CHLORIDE: 105 mmol/L (ref 101–111)
CO2: 22 mmol/L (ref 22–32)
Calcium: 8 mg/dL — ABNORMAL LOW (ref 8.9–10.3)
Creatinine, Ser: 0.67 mg/dL (ref 0.44–1.00)
GFR calc Af Amer: >60 mL/min (ref >60)
Glucose, Bld: 147 mg/dL — ABNORMAL HIGH (ref 65–99)
Potassium: 3.4 mmol/L — ABNORMAL LOW (ref 3.5–5.1)
SODIUM: 138 mmol/L (ref 135–145)
TOTAL PROTEIN: 6.5 g/dL (ref 6.5–8.1)

## 2015-04-23 LAB — CBC
HCT: 34.2 % — ABNORMAL LOW (ref 36.0–46.0)
Hemoglobin: 11.5 g/dL — ABNORMAL LOW (ref 12.0–15.0)
MCH: 30.7 pg (ref 26.0–34.0)
MCHC: 33.6 g/dL (ref 30.0–36.0)
MCV: 91.2 fL (ref 78.0–100.0)
Platelets: 329 10*3/uL (ref 150–400)
RBC: 3.75 MIL/uL — AB (ref 3.87–5.11)
RDW: 14.8 % (ref 11.5–15.5)
WBC: 10.2 10*3/uL (ref 4.0–10.5)

## 2015-04-23 LAB — URINALYSIS, ROUTINE W REFLEX MICROSCOPIC
Bilirubin Urine: NEGATIVE
GLUCOSE, UA: NEGATIVE mg/dL
Hgb urine dipstick: NEGATIVE
Ketones, ur: 15 mg/dL — AB
Nitrite: POSITIVE — AB
PH: 7 (ref 5.0–8.0)
Protein, ur: NEGATIVE mg/dL
Specific Gravity, Urine: 1.014 (ref 1.005–1.030)
Urobilinogen, UA: 0.2 mg/dL (ref 0.0–1.0)

## 2015-04-23 LAB — I-STAT TROPONIN, ED: TROPONIN I, POC: 0 ng/mL (ref 0.00–0.08)

## 2015-04-23 LAB — URINE MICROSCOPIC-ADD ON

## 2015-04-23 MED ORDER — SODIUM CHLORIDE 0.9 % IV BOLUS (SEPSIS)
1000.0000 mL | Freq: Once | INTRAVENOUS | Status: AC
  Administered 2015-04-23: 1000 mL via INTRAVENOUS

## 2015-04-23 MED ORDER — ONDANSETRON HCL 4 MG/2ML IJ SOLN
4.0000 mg | Freq: Once | INTRAMUSCULAR | Status: AC
  Administered 2015-04-23: 4 mg via INTRAVENOUS
  Filled 2015-04-23: qty 2

## 2015-04-23 MED ORDER — CEPHALEXIN 500 MG PO CAPS: 500.0000 mg | ORAL_CAPSULE | Freq: Three times a day (TID) | ORAL | Status: DC

## 2015-04-23 MED ORDER — DEXTROSE 5 % IV SOLN
1.0000 g | Freq: Once | INTRAVENOUS | Status: AC
  Administered 2015-04-23: 1 g via INTRAVENOUS
  Filled 2015-04-23: qty 10

## 2015-04-23 MED ORDER — MORPHINE SULFATE 2 MG/ML IJ SOLN
2.0000 mg | Freq: Once | INTRAMUSCULAR | Status: AC
  Administered 2015-04-23: 2 mg via INTRAVENOUS
  Filled 2015-04-23: qty 1

## 2015-04-23 MED ORDER — POTASSIUM CHLORIDE CRYS ER 20 MEQ PO TBCR
20.0000 meq | EXTENDED_RELEASE_TABLET | Freq: Once | ORAL | Status: AC
  Administered 2015-04-23: 20 meq via ORAL
  Filled 2015-04-23: qty 1

## 2015-04-23 NOTE — ED Notes (Signed)
Pt. Fell on 04-06-15 and fractured her left humerus. Pt. Has surgery to repair her humerus on 04-10-15 by dr. Lorin Mercy. Pt. Was DC'd with zofran for nausea and she takes it regularly but it has not relieved her nausea. Pt. Has hx of a.fib. Pt received 519ml bolus with EMS and 4mg  of IV zofran. Pt. Still experiencing nausea on arrival.

## 2015-04-23 NOTE — Discharge Instructions (Signed)
It was our pleasure to provide your ER care today - we hope that you feel better.  Rest. Drink plenty of fluids.   Take your zofran as need for nausea.  The lab work shows a possible urine infection - take antibiotic as prescribed. A urine culture was sent the results of which will be back in 2-3 days, have your doctor follow up on that result then.  Also from the labs, your potassium level is slightly low (3.4) - eat plenty of fruits and vegetables, and follow up with primary care doctor.  Your xrays show a moderate/large amount of stool in colon - take colace (stool softener) and miralax (laxative) as need.  Follow up with your primary care doctor in the next couple days for recheck.  Return to ER if worse, new symptoms, persistent vomiting, worsening or severe abdominal pain, high fevers, weak/faint, other concern.     Nausea and Vomiting Nausea is a sick feeling that often comes before throwing up (vomiting). Vomiting is a reflex where stomach contents come out of your mouth. Vomiting can cause severe loss of body fluids (dehydration). Children and elderly adults can become dehydrated quickly, especially if they also have diarrhea. Nausea and vomiting are symptoms of a condition or disease. It is important to find the cause of your symptoms. CAUSES   Direct irritation of the stomach lining. This irritation can result from increased acid production (gastroesophageal reflux disease), infection, food poisoning, taking certain medicines (such as nonsteroidal anti-inflammatory drugs), alcohol use, or tobacco use.  Signals from the brain.These signals could be caused by a headache, heat exposure, an inner ear disturbance, increased pressure in the brain from injury, infection, a tumor, or a concussion, pain, emotional stimulus, or metabolic problems.  An obstruction in the gastrointestinal tract (bowel obstruction).  Illnesses such as diabetes, hepatitis, gallbladder problems,  appendicitis, kidney problems, cancer, sepsis, atypical symptoms of a heart attack, or eating disorders.  Medical treatments such as chemotherapy and radiation.  Receiving medicine that makes you sleep (general anesthetic) during surgery. DIAGNOSIS Your caregiver may ask for tests to be done if the problems do not improve after a few days. Tests may also be done if symptoms are severe or if the reason for the nausea and vomiting is not clear. Tests may include:  Urine tests.  Blood tests.  Stool tests.  Cultures (to look for evidence of infection).  X-rays or other imaging studies. Test results can help your caregiver make decisions about treatment or the need for additional tests. TREATMENT You need to stay well hydrated. Drink frequently but in small amounts.You may wish to drink water, sports drinks, clear broth, or eat frozen ice pops or gelatin dessert to help stay hydrated.When you eat, eating slowly may help prevent nausea.There are also some antinausea medicines that may help prevent nausea. HOME CARE INSTRUCTIONS   Take all medicine as directed by your caregiver.  If you do not have an appetite, do not force yourself to eat. However, you must continue to drink fluids.  If you have an appetite, eat a normal diet unless your caregiver tells you differently.  Eat a variety of complex carbohydrates (rice, wheat, potatoes, bread), lean meats, yogurt, fruits, and vegetables.  Avoid high-fat foods because they are more difficult to digest.  Drink enough water and fluids to keep your urine clear or pale yellow.  If you are dehydrated, ask your caregiver for specific rehydration instructions. Signs of dehydration may include:  Severe thirst.  Dry lips  and mouth.  Dizziness.  Dark urine.  Decreasing urine frequency and amount.  Confusion.  Rapid breathing or pulse. SEEK IMMEDIATE MEDICAL CARE IF:   You have blood or brown flecks (like coffee grounds) in your  vomit.  You have black or bloody stools.  You have a severe headache or stiff neck.  You are confused.  You have severe abdominal pain.  You have chest pain or trouble breathing.  You do not urinate at least once every 8 hours.  You develop cold or clammy skin.  You continue to vomit for longer than 24 to 48 hours.  You have a fever. MAKE SURE YOU:   Understand these instructions.  Will watch your condition.  Will get help right away if you are not doing well or get worse. Document Released: 10/14/2005 Document Revised: 01/06/2012 Document Reviewed: 03/13/2011 [facility] Patient Information 2015 [place], [place]. This information is not intended to replace advice given to you by your health care provider. Make sure you discuss any questions you have with your health care provider.     Abdominal Pain Many things can cause abdominal pain. Usually, abdominal pain is not caused by a disease and will improve without treatment. It can often be observed and treated at home. Your health care provider will do a physical exam and possibly order blood tests and X-rays to help determine the seriousness of your pain. However, in many cases, more time must pass before a clear cause of the pain can be found. Before that point, your health care provider may not know if you need more testing or further treatment. HOME CARE INSTRUCTIONS  Monitor your abdominal pain for any changes. The following actions may help to alleviate any discomfort you are experiencing:  Only take over-the-counter or prescription medicines as directed by your health care provider.  Do not take laxatives unless directed to do so by your health care provider.  Try a clear liquid diet (broth, tea, or water) as directed by your health care provider. Slowly move to a bland diet as tolerated. SEEK MEDICAL CARE IF:  You have unexplained abdominal pain.  You have abdominal pain associated with nausea or diarrhea.  You have  pain when you urinate or have a bowel movement.  You experience abdominal pain that wakes you in the night.  You have abdominal pain that is worsened or improved by eating food.  You have abdominal pain that is worsened with eating fatty foods.  You have a fever. SEEK IMMEDIATE MEDICAL CARE IF:   Your pain does not go away within 2 hours.  You keep throwing up (vomiting).  Your pain is felt only in portions of the abdomen, such as the right side or the left lower portion of the abdomen.  You pass bloody or black tarry stools. MAKE SURE YOU:  Understand these instructions.   Will watch your condition.   Will get help right away if you are not doing well or get worse.  Document Released: 07/24/2005 Document Revised: 10/19/2013 Document Reviewed: 06/23/2013 [facility] Patient Information 2015 [place], [place]. This information is not intended to replace advice given to you by your health care provider. Make sure you discuss any questions you have with your health care provider.    Hypokalemia Hypokalemia means that the amount of potassium in the blood is lower than normal.Potassium is a chemical, called an electrolyte, that helps regulate the amount of fluid in the body. It also stimulates muscle contraction and helps nerves function properly.Most of  the body's potassium is inside of cells, and only a very small amount is in the blood. Because the amount in the blood is so small, minor changes can be life-threatening. CAUSES  Antibiotics.  Diarrhea or vomiting.  Using laxatives too much, which can cause diarrhea.  Chronic kidney disease.  Water pills (diuretics).  Eating disorders (bulimia).  Low magnesium level.  Sweating a lot. SIGNS AND SYMPTOMS  Weakness.  Constipation.  Fatigue.  Muscle cramps.  Mental confusion.  Skipped heartbeats or irregular heartbeat (palpitations).  Tingling or numbness. DIAGNOSIS  Your health care provider can diagnose  hypokalemia with blood tests. In addition to checking your potassium level, your health care provider may also check other lab tests. TREATMENT Hypokalemia can be treated with potassium supplements taken by mouth or adjustments in your current medicines. If your potassium level is very low, you may need to get potassium through a vein (IV) and be monitored in the hospital. A diet high in potassium is also helpful. Foods high in potassium are:  Nuts, such as peanuts and pistachios.  Seeds, such as sunflower seeds and pumpkin seeds.  Peas, lentils, and lima beans.  Whole grain and bran cereals and breads.  Fresh fruit and vegetables, such as apricots, avocado, bananas, cantaloupe, kiwi, oranges, tomatoes, asparagus, and potatoes.  Orange and tomato juices.  Red meats.  Fruit yogurt. HOME CARE INSTRUCTIONS  Take all medicines as prescribed by your health care provider.  Maintain a healthy diet by including nutritious food, such as fruits, vegetables, nuts, whole grains, and lean meats.  If you are taking a laxative, be sure to follow the directions on the label. SEEK MEDICAL CARE IF:  Your weakness gets worse.  You feel your heart pounding or racing.  You are vomiting or having diarrhea.  You are diabetic and having trouble keeping your blood glucose in the normal range. SEEK IMMEDIATE MEDICAL CARE IF:  You have chest pain, shortness of breath, or dizziness.  You are vomiting or having diarrhea for more than 2 days.  You faint. MAKE SURE YOU:   Understand these instructions.  Will watch your condition.  Will get help right away if you are not doing well or get worse. Document Released: 10/14/2005 Document Revised: 08/04/2013 Document Reviewed: 04/16/2013 [facility] Patient Information 2015 [place], [place]. This information is not intended to replace advice given to you by your health care provider. Make sure you discuss any questions you have with your health care  provider.

## 2015-04-23 NOTE — ED Provider Notes (Signed)
CSN: [id number]     Arrival date & time 04/23/15  1939 History   First MD Initiated Contact with Patient 04/23/15 1942     Chief Complaint  Patient presents with  . Nausea     (Consider location/radiation/quality/duration/timing/severity/associated sxs/prior Treatment) The history is provided by the patient and a relative.  Patient presents c/o nausea w multiple episodes emesis since this morning. Emesis clear, not bloody or bilious. Did have normal bm today. No abd distension. C/o diffuse abd cramping pain, moderate, constant, non radiating. No back or flank pain. No dysuria or gu c/o. Had surgery for proximal left humerus fracture a couple weeks ago, went well. Has been using pain med, percocet, sparingly, w just one dose in the last day. No other new meds or change in meds. Pt denies fever or chills. No chest pain or sob. No headaches. No known ill contacts or bad food ingestion.      Past Medical History  Diagnosis Date  . Hemorrhage of gastrointestinal tract, unspecified   . Anxiety state, unspecified   . Left ventricular systolic dysfunction     hx of mild  . History of peptic ulcer   . Esophageal reflux   . Mitral valve insufficiency and aortic valve insufficiency   . Palpitations     history  . Other primary cardiomyopathies   . Wears glasses   . Other left bundle branch block   . Paroxysmal supraventricular tachycardia   . LBBB (left bundle branch block)   . Cataract   . Cancer     basil cell carcinomas removed  . Arthritis    Past Surgical History  Procedure Laterality Date  . Esophagogastroduodenoscopy    . Tonsillectomy    . Appendectomy    . Abdominal hysterectomy    . Tubal ligation    . Knee arthroscopy  2009    left  . Breast surgery      biopsy  . Open reduction internal fixation (orif) metacarpal Left 11/05/2013    Procedure: CLOSED REDUCTION/PINNING VS OPEN REDUCTION INTERNAL FIXATION (ORIF) LEFT SMALL  METACARPAL FRACTURE ;  Surgeon: Cammie Sickle., MD;  Location: [place];  Service: Orthopedics;  Laterality: Left;  . Cataract extraction Left   . Foot surgery Left     hammer toe and bunion  . Orif humerus fracture Left 04/10/2015    Procedure: OPEN REDUCTION INTERNAL FIXATION (ORIF) PROXIMAL HUMERUS FRACTURE;  Surgeon: Marybelle Killings, MD;  Location: [place];  Service: Orthopedics;  Laterality: Left;   Family History  Problem Relation Age of Onset  . Hypertension Mother    History  Substance Use Topics  . Smoking status: Never Smoker   . Smokeless tobacco: Never Used  . Alcohol Use: No   OB History    No data available     Review of Systems  Constitutional: Negative for fever and chills.  HENT: Negative for sore throat.   Eyes: Negative for redness.  Respiratory: Negative for cough and shortness of breath.   Cardiovascular: Negative for chest pain.  Gastrointestinal: Positive for nausea and vomiting. Negative for abdominal pain and diarrhea.  Endocrine: Negative for polyuria.  Genitourinary: Negative for dysuria and flank pain.  Musculoskeletal: Negative for back pain and neck pain.  Skin: Negative for rash.  Neurological: Negative for headaches.  Hematological: Does not bruise/bleed easily.  Psychiatric/Behavioral: Negative for confusion.      Allergies  Aleve; Adhesive; and Aspirin  Home Medications   Prior to  Admission medications   Medication Sig Start Date End Date Taking? Authorizing Provider  aspirin EC 81 MG tablet Take 1 tablet (81 mg total) by mouth daily. 11/12/11  Yes Larey Dresser, MD  clorazepate (TRANXENE) 7.5 MG tablet Take 7.5 mg by mouth 2 (two) times daily as needed for anxiety.    Yes Historical Provider, MD  docusate sodium (COLACE) 100 MG capsule Take 100 mg by mouth 2 (two) times daily.   Yes Historical Provider, MD  estradiol (ESTRACE) 1 MG tablet Take 0.5 mg by mouth daily.   Yes Historical Provider, MD  fluticasone (FLONASE) 50 MCG/ACT nasal spray Place 1 spray into  both nostrils daily as needed for allergies.  02/25/15  Yes Historical Provider, MD  ondansetron (ZOFRAN) 4 MG tablet Take 1 tablet (4 mg total) by mouth every 8 (eight) hours as needed for nausea or vomiting. 04/06/15  Yes Addison Lank, MD  oxyCODONE-acetaminophen (ROXICET) 5-325 MG per tablet Take 2 tablets by mouth every 4 (four) hours as needed for severe pain. 04/12/15  Yes Marybelle Killings, MD  promethazine (PHENERGAN) 25 MG tablet Take 25 mg by mouth every 6 (six) hours as needed for nausea or vomiting.   Yes Historical Provider, MD  propafenone (RYTHMOL) 150 MG tablet TAKE 1 TABLET BY MOUTH TWICE A DAY. NEED OFFICE VISIT FOR FURTHER REFILLS 04/17/15  Yes Deboraha Sprang, MD  sertraline (ZOLOFT) 25 MG tablet Take 75 mg by mouth daily.  01/12/14  Yes Historical Provider, MD  traMADol (ULTRAM) 50 MG tablet Take 50 mg by mouth every 6 (six) hours as needed for moderate pain or severe pain.    Yes Historical Provider, MD  propafenone (RYTHMOL) 150 MG tablet Take 150 mg by mouth 2 (two) times daily before lunch and supper.    Historical Provider, MD   BP 160/83 mmHg  Pulse 84  Temp(Src) 97.5 F (36.4 C) (Oral)  Resp 20  SpO2 95% Physical Exam  Constitutional: She is oriented to person, place, and time. She appears well-developed and well-nourished. No distress.  HENT:  Mouth/Throat: Oropharynx is clear and moist.  Eyes: Conjunctivae are normal. Pupils are equal, round, and reactive to light. No scleral icterus.  Neck: Neck supple. No tracheal deviation present.  Cardiovascular: Normal rate, regular rhythm, normal heart sounds and intact distal pulses.   Pulmonary/Chest: Effort normal and breath sounds normal. No respiratory distress.  Abdominal: Soft. Normal appearance and bowel sounds are normal. She exhibits no distension and no mass. There is tenderness. There is no rebound and no guarding.  Diffuse abd tenderness. No incarcerated hernia. Mild distension.   Genitourinary:  No cva tenderness   Musculoskeletal: She exhibits no edema.  Left upper arm surgical wound healing without sign of infection.   Neurological: She is alert and oriented to person, place, and time.  Skin: Skin is warm and dry. No rash noted. She is not diaphoretic.  Psychiatric: She has a normal mood and affect.  Nursing note and vitals reviewed.   ED Course  Procedures (including critical care time) Labs Review  Results for orders placed or performed during the hospital encounter of 04/23/15  CBC  Result Value Ref Range   WBC 10.2 4.0 - 10.5 K/uL   RBC 3.75 (L) 3.87 - 5.11 MIL/uL   Hemoglobin 11.5 (L) 12.0 - 15.0 g/dL   HCT 34.2 (L) 36.0 - 46.0 %   MCV 91.2 78.0 - 100.0 fL   MCH 30.7 26.0 - 34.0 pg   MCHC  33.6 30.0 - 36.0 g/dL   RDW 14.8 11.5 - 15.5 %   Platelets 329 150 - 400 K/uL  Comprehensive metabolic panel  Result Value Ref Range   Sodium 138 135 - 145 mmol/L   Potassium 3.4 (L) 3.5 - 5.1 mmol/L   Chloride 105 101 - 111 mmol/L   CO2 22 22 - 32 mmol/L   Glucose, Bld 147 (H) 65 - 99 mg/dL   BUN 15 6 - 20 mg/dL   Creatinine, Ser 0.67 0.44 - 1.00 mg/dL   Calcium 8.0 (L) 8.9 - 10.3 mg/dL   Total Protein 6.5 6.5 - 8.1 g/dL   Albumin 3.1 (L) 3.5 - 5.0 g/dL   AST 17 15 - 41 U/L   ALT 12 (L) 14 - 54 U/L   Alkaline Phosphatase 103 38 - 126 U/L   Total Bilirubin 0.3 0.3 - 1.2 mg/dL   GFR calc non Af Amer >60 >60 mL/min   GFR calc Af Amer >60 >60 mL/min   Anion gap 11 5 - 15  Urinalysis, Routine w reflex microscopic (not at [facility])  Result Value Ref Range   Color, Urine YELLOW YELLOW   APPearance CLOUDY (A) CLEAR   Specific Gravity, Urine 1.014 1.005 - 1.030   pH 7.0 5.0 - 8.0   Glucose, UA NEGATIVE NEGATIVE mg/dL   Hgb urine dipstick NEGATIVE NEGATIVE   Bilirubin Urine NEGATIVE NEGATIVE   Ketones, ur 15 (A) NEGATIVE mg/dL   Protein, ur NEGATIVE NEGATIVE mg/dL   Urobilinogen, UA 0.2 0.0 - 1.0 mg/dL   Nitrite POSITIVE (A) NEGATIVE   Leukocytes, UA TRACE (A) NEGATIVE  Urine  microscopic-add on  Result Value Ref Range   Squamous Epithelial / LPF MANY (A) RARE   WBC, UA 3-6 <3 WBC/hpf   RBC / HPF 0-2 <3 RBC/hpf   Bacteria, UA MANY (A) RARE  I-stat troponin, ED  Result Value Ref Range   Troponin i, poc 0.00 0.00 - 0.08 ng/mL   Comment 3             Dg Abd Acute W/chest  04/23/2015   CLINICAL DATA:  81 year old female with abdominal pain.  EXAM: DG ABDOMEN ACUTE W/ 1V CHEST  COMPARISON:  Chest radiographs 04/06/2015  FINDINGS: Cardiomegaly and tortuous thoracic aorta, unchanged. Minimal atelectasis or scarring at the left lung base. No consolidation to suggest pneumonia. There is no free intra-abdominal air. No dilated bowel loops to suggest obstruction. Moderate-large volume of stool throughout the colon. No radiopaque calculi. No acute osseous abnormalities are seen. Surgical hardware in the left proximal humerus from recent ORIF.  IMPRESSION: 1. Moderate to large volume of colonic stool, can be seen with constipation. No bowel obstruction. 2. Stable cardiomegaly with minimal atelectasis or scarring at the left lung base.   Electronically Signed   By: Jeb Levering M.D.   On: 04/23/2015 21:19        EKG Interpretation   Date/Time:  Sunday April 23 2015 20:21:09 EDT Ventricular Rate:  82 PR Interval:  184 QRS Duration: 167 QT Interval:  447 QTC Calculation: 522 R Axis:   -67 Text Interpretation:  Sinus rhythm Left bundle branch block No significant  change since last tracing Confirmed by Nainika Newlun  MD, Lennette Bihari ([id number]) on  04/23/2015 8:45:50 PM      MDM   Iv ns. Labs.   Morphine iv. zofran iv.  Imaging.  Reviewed nursing notes and prior charts for additional history.   Recheck, pt tolerating po fluid well.  No recurrent nv.     Recheck abd no pain. No focal tenderness on exam.  Possible uti with nitrite and le positive, many bacteria, although minimal wbc - will cx and rx.   k sl low, kcl po.  Recheck, vitals normal, no pain, no nv.  Continues to tolerate po.  Pt currently appears stable for d/c.  Return precautions provided.     Lajean Saver, MD 04/23/15 [phone]

## 2015-04-23 NOTE — ED Notes (Signed)
Pt refused fluids at this time

## 2015-04-25 LAB — URINE CULTURE

## 2015-06-12 ENCOUNTER — Other Ambulatory Visit: Payer: Self-pay | Admitting: Internal Medicine

## 2015-06-19 ENCOUNTER — Other Ambulatory Visit: Payer: Self-pay | Admitting: Geriatric Medicine

## 2015-06-19 DIAGNOSIS — R1013 Epigastric pain: Secondary | ICD-10-CM

## 2015-06-23 ENCOUNTER — Ambulatory Visit
Admission: RE | Admit: 2015-06-23 | Discharge: 2015-06-23 | Disposition: A | Discharge status: Home / Self Care | Payer: Medicare Other | Source: Ambulatory Visit | Attending: Geriatric Medicine | Admitting: Geriatric Medicine

## 2015-06-23 DIAGNOSIS — R1013 Epigastric pain: Secondary | ICD-10-CM

## 2015-06-23 MED ORDER — IOPAMIDOL (ISOVUE-300) INJECTION 61%
100.0000 mL | Freq: Once | INTRAVENOUS | Status: AC | PRN
  Administered 2015-06-23: 100 mL via INTRAVENOUS

## 2015-07-05 ENCOUNTER — Ambulatory Visit
Admission: RE | Admit: 2015-07-05 | Discharge: 2015-07-05 | Disposition: A | Discharge status: Home / Self Care | Payer: Medicare Other | Source: Ambulatory Visit | Attending: Geriatric Medicine | Admitting: Geriatric Medicine

## 2015-07-05 ENCOUNTER — Other Ambulatory Visit: Payer: Self-pay | Admitting: Geriatric Medicine

## 2015-07-05 DIAGNOSIS — M5489 Other dorsalgia: Secondary | ICD-10-CM

## 2015-08-16 ENCOUNTER — Other Ambulatory Visit: Payer: Self-pay | Admitting: Gastroenterology

## 2015-08-30 ENCOUNTER — Telehealth: Payer: Self-pay | Admitting: Internal Medicine

## 2015-08-30 NOTE — Telephone Encounter (Signed)
New message      1. What dental office are you calling from? Dr Philipp Ovens  2. What is your office phone and fax number?    What type of procedure is the patient having performed?  Cleaning and possible dental work 3. What date is procedure scheduled? Pt in chair now 4. What is your question (ex. Antibiotics prior to procedure, holding medication-we need to know how long dentist wants pt to hold med)?  Calling to see if pt needs an antibiotic prior to dental exam

## 2015-08-30 NOTE — Telephone Encounter (Signed)
Spoke with Ok Edwards at dentist office and advised her that from a cardiac standpoint pt does not require SBE prophylaxis. Ok Edwards stated that after speaking with the pt further she realizes that it was actually a couple of other offices she should have called, not ours. Ok Edwards was appreciative for call back.

## 2015-10-02 ENCOUNTER — Encounter (HOSPITAL_COMMUNITY): Payer: Self-pay | Admitting: *Deleted

## 2015-10-10 ENCOUNTER — Encounter (HOSPITAL_COMMUNITY): Payer: Self-pay

## 2015-10-10 ENCOUNTER — Ambulatory Visit (HOSPITAL_COMMUNITY): Payer: Medicare Other | Admitting: Certified Registered Nurse Anesthetist

## 2015-10-10 ENCOUNTER — Encounter (HOSPITAL_COMMUNITY)
Admission: RE | Disposition: A | Discharge status: Home / Self Care | Payer: Self-pay | Source: Ambulatory Visit | Attending: Gastroenterology

## 2015-10-10 ENCOUNTER — Ambulatory Visit (HOSPITAL_COMMUNITY)
Admission: RE | Admit: 2015-10-10 | Discharge: 2015-10-10 | Disposition: A | Discharge status: Home / Self Care | Payer: Medicare Other | Source: Ambulatory Visit | Attending: Gastroenterology | Admitting: Gastroenterology

## 2015-10-10 DIAGNOSIS — K319 Disease of stomach and duodenum, unspecified: Secondary | ICD-10-CM | POA: Insufficient documentation

## 2015-10-10 DIAGNOSIS — I352 Nonrheumatic aortic (valve) stenosis with insufficiency: Secondary | ICD-10-CM | POA: Diagnosis not present

## 2015-10-10 DIAGNOSIS — Z9841 Cataract extraction status, right eye: Secondary | ICD-10-CM | POA: Diagnosis not present

## 2015-10-10 DIAGNOSIS — M1990 Unspecified osteoarthritis, unspecified site: Secondary | ICD-10-CM | POA: Insufficient documentation

## 2015-10-10 DIAGNOSIS — M47816 Spondylosis without myelopathy or radiculopathy, lumbar region: Secondary | ICD-10-CM | POA: Diagnosis not present

## 2015-10-10 DIAGNOSIS — R933 Abnormal findings on diagnostic imaging of other parts of digestive tract: Secondary | ICD-10-CM | POA: Diagnosis not present

## 2015-10-10 DIAGNOSIS — K219 Gastro-esophageal reflux disease without esophagitis: Secondary | ICD-10-CM | POA: Diagnosis not present

## 2015-10-10 DIAGNOSIS — I471 Supraventricular tachycardia: Secondary | ICD-10-CM | POA: Insufficient documentation

## 2015-10-10 DIAGNOSIS — Z9842 Cataract extraction status, left eye: Secondary | ICD-10-CM | POA: Diagnosis not present

## 2015-10-10 HISTORY — PX: ESOPHAGOGASTRODUODENOSCOPY (EGD) WITH PROPOFOL: SHX5813

## 2015-10-10 SURGERY — ESOPHAGOGASTRODUODENOSCOPY (EGD) WITH PROPOFOL
Anesthesia: Monitor Anesthesia Care

## 2015-10-10 MED ORDER — FENTANYL CITRATE (PF) 100 MCG/2ML IJ SOLN: 25.0000 ug | INTRAMUSCULAR | Status: DC | PRN

## 2015-10-10 MED ORDER — LACTATED RINGERS IV SOLN
INTRAVENOUS | Status: DC
  Administered 2015-10-10: 1000 mL via INTRAVENOUS

## 2015-10-10 MED ORDER — SODIUM CHLORIDE 0.9 % IV SOLN: INTRAVENOUS | Status: DC

## 2015-10-10 MED ORDER — PROPOFOL 10 MG/ML IV BOLUS
INTRAVENOUS | Status: AC
  Filled 2015-10-10: qty 40

## 2015-10-10 MED ORDER — LACTATED RINGERS IV SOLN
INTRAVENOUS | Status: DC
  Administered 2015-10-10: 12:00:00 via INTRAVENOUS

## 2015-10-10 MED ORDER — PROPOFOL 10 MG/ML IV BOLUS
INTRAVENOUS | Status: DC | PRN
  Administered 2015-10-10: 25 mg via INTRAVENOUS
  Administered 2015-10-10: 75 mg via INTRAVENOUS
  Administered 2015-10-10: 30 mg via INTRAVENOUS

## 2015-10-10 SURGICAL SUPPLY — 14 items

## 2015-10-10 NOTE — Anesthesia Preprocedure Evaluation (Signed)
Anesthesia Evaluation  Patient identified by MRN, date of birth, ID band Patient awake    Reviewed: Allergy & Precautions, NPO status , Patient's Chart, lab work & pertinent test results  History of Anesthesia Complications Negative for: history of anesthetic complications  Airway Mallampati: II  TM Distance: <3 FB Neck ROM: Full    Dental  (+) Dental Advisory Given, Teeth Intact   Pulmonary    Pulmonary exam normal breath sounds clear to auscultation       Cardiovascular (-) hypertension(-) angina(-) Past MI and (-) CHF Normal cardiovascular exam+ dysrhythmias Supra Ventricular Tachycardia + Valvular Problems/Murmurs AI and MR  Rhythm:Regular  LBBB. Left ventricular systolic dysfunction ( mild )   Neuro/Psych PSYCHIATRIC DISORDERS Anxiety    GI/Hepatic Neg liver ROS, GERD  Medicated,  Endo/Other  negative endocrine ROS  Renal/GU negative Renal ROS     Musculoskeletal  (+) Arthritis ,   Abdominal   Peds  Hematology negative hematology ROS (+)   Anesthesia Other Findings   Reproductive/Obstetrics                             Anesthesia Physical Anesthesia Plan  ASA: III  Anesthesia Plan: MAC   Post-op Pain Management:    Induction:   Airway Management Planned:   Additional Equipment:   Intra-op Plan:   Post-operative Plan:   Informed Consent:   Plan Discussed with: Surgeon  Anesthesia Plan Comments:         Anesthesia Quick Evaluation

## 2015-10-10 NOTE — Discharge Instructions (Signed)
Colonoscopy, Care After °Refer to this sheet in the next few weeks. These instructions provide you with information on caring for yourself after your procedure. Your health care provider may also give you more specific instructions. Your treatment has been planned according to current medical practices, but problems sometimes occur. Call your health care provider if you have any problems or questions after your procedure. °WHAT TO EXPECT AFTER THE PROCEDURE  °After your procedure, it is typical to have the following: °· A small amount of blood in your stool. °· Moderate amounts of gas and mild abdominal cramping or bloating. °HOME CARE INSTRUCTIONS °· Do not drive, operate machinery, or sign important documents for 24 hours. °· You may shower and resume your regular physical activities, but move at a slower pace for the first 24 hours. °· Take frequent rest periods for the first 24 hours. °· Walk around or put a warm pack on your abdomen to help reduce abdominal cramping and bloating. °· Drink enough fluids to keep your urine clear or pale yellow. °· You may resume your normal diet as instructed by your health care provider. Avoid heavy or fried foods that are hard to digest. °· Avoid drinking alcohol for 24 hours or as instructed by your health care provider. °· Only take over-the-counter or prescription medicines as directed by your health care provider. °· If a tissue sample (biopsy) was taken during your procedure: °¨ Do not take aspirin or blood thinners for 7 days, or as instructed by your health care provider. °¨ Do not drink alcohol for 7 days, or as instructed by your health care provider. °¨ Eat soft foods for the first 24 hours. °SEEK MEDICAL CARE IF: °You have persistent spotting of blood in your stool 2-3 days after the procedure. °SEEK IMMEDIATE MEDICAL CARE IF: °· You have more than a small spotting of blood in your stool. °· You pass large blood clots in your stool. °· Your abdomen is swollen  (distended). °· You have nausea or vomiting. °· You have a fever. °· You have increasing abdominal pain that is not relieved with medicine. °  °This information is not intended to replace advice given to you by your health care provider. Make sure you discuss any questions you have with your health care provider. °  °Document Released: 05/28/2004 Document Revised: 08/04/2013 Document Reviewed: 06/21/2013 °Elsevier Interactive Patient Education ©2016 [employer] ° °

## 2015-10-10 NOTE — Anesthesia Postprocedure Evaluation (Signed)
Anesthesia Post Note  Patient: Yvonne [name]  Procedure(s) Performed: Procedure(s) (LRB): ESOPHAGOGASTRODUODENOSCOPY (EGD) WITH PROPOFOL (N/A)  Patient location during evaluation: PACU Anesthesia Type: MAC Level of consciousness: awake and alert Pain management: pain level controlled Vital Signs Assessment: post-procedure vital signs reviewed and stable Respiratory status: spontaneous breathing, nonlabored ventilation, respiratory function stable and patient connected to nasal cannula oxygen Cardiovascular status: blood pressure returned to baseline and stable Postop Assessment: no signs of nausea or vomiting Anesthetic complications: no    Last Vitals:  Filed Vitals:   10/10/15 1220 10/10/15 1230  BP: 98/37 121/55  Pulse: 68 67  Temp:    Resp: 20 14    Last Pain: There were no vitals filed for this visit.               Garl Speigner L

## 2015-10-10 NOTE — H&P (Signed)
  Procedure: Diagnostic esophagogastroduodenoscopy to evaluate diffuse bowel wall thickening involving the distal stomach seen on CT scan of the abdomen and pelvis performed 06/23/2015  History: The patient is an 81 year old female born 12/23/1928. She underwent a CT scan of the abdomen and pelvis to evaluate nausea. 06/23/2015 CT scan of the abdomen and pelvis showed diffuse bowel wall thickening involving the distal stomach. On 11/24/2006 the patient underwent a normal esophagogastroduodenoscopy; gastric biopsies showed no H. pylori gastritis.  Past medical history: Lumbar spondylosis. Gastroesophageal reflux. Migraine headache syndrome. Allergic rhinitis. Supraventricular tachycardia. Cystocele. Fibrocystic breast disease. Mitral regurgitation. Aortic valve sclerosis with mild aortic valve insufficiency. Basal cell skin cancer. Major depression. Left inguinal hernia surgery. Hysterectomy with one ovary removed. Bilateral cataract surgery. Left humeral fracture surgery. Left fifth finger surgery.  Medication allergies: Flexeril. Toprol. Ambien.  Exam: The patient is alert and lying comfortably on the endoscopy stretcher. Abdomen is soft and nontender to palpation. Lungs are clear to auscultation. Cardiac exam reveals a regular rhythm.  Plan: Proceed with diagnostic esophagogastroduodenoscopy

## 2015-10-10 NOTE — Transfer of Care (Signed)
Immediate Anesthesia Transfer of Care Note  Patient: Yvonne [name]  Procedure(s) Performed: Procedure(s): ESOPHAGOGASTRODUODENOSCOPY (EGD) WITH PROPOFOL (N/A)  Patient Location: PACU  Anesthesia Type:MAC  Level of Consciousness: awake, alert  and oriented  Airway & Oxygen Therapy: Patient Spontanous Breathing and Patient connected to nasal cannula oxygen  Post-op Assessment: Report given to RN and Post -op Vital signs reviewed and stable  Post vital signs: Reviewed and stable  Last Vitals:  Filed Vitals:   10/10/15 1137  BP: 152/71  Pulse: 76  Temp: 36.7 C  Resp: 21    Complications: No apparent anesthesia complications

## 2015-10-10 NOTE — Op Note (Signed)
Procedure: Diagnostic esophagogastroduodenoscopy to evaluate diffuse bowel wall thickening involving the distal stomach seen by CT scan of the abdomen and pelvis performed on 06/23/2015. 11/24/2006 normal esophagogastroduodenoscopy performed; gastric biopsies showed no H. pylori gastritis  Endoscopist: Earle Gell  Premedication: Propofol administered by anesthesia  Procedure: The patient was placed in the left lateral decubitus position. The Pentax gastroscope was passed through the posterior hypopharynx into the proximal esophagus without difficulty. I did not visualize the vocal cords  Esophagoscopy: The proximal, mid, and lower segments of the esophageal mucosa appeared normal. The squamocolumnar junction appeared regular and was noted at 40 cm from the incisor teeth.  Gastroscopy: Retroflex view of the gastric cardia and fundus was normal. The gastric body, antrum, and pylorus appeared normal. In the mid gastric antrum on the greater curvature was a mucosal scar consistent with a healed ulcer. Gastric antral biopsies were performed.  Duodenoscopy: The duodenal bulb and descending duodenum appeared normal.  Assessment: Normal esophagogastroduodenoscopy. Gastric antral biopsies were performed

## 2015-10-11 ENCOUNTER — Encounter (HOSPITAL_COMMUNITY): Payer: Self-pay | Admitting: Gastroenterology

## 2015-11-29 ENCOUNTER — Encounter: Payer: Self-pay | Admitting: Cardiology

## 2016-02-13 ENCOUNTER — Other Ambulatory Visit: Payer: Self-pay | Admitting: Internal Medicine

## 2016-03-05 ENCOUNTER — Encounter: Payer: Self-pay | Admitting: Internal Medicine

## 2016-03-05 ENCOUNTER — Ambulatory Visit (INDEPENDENT_AMBULATORY_CARE_PROVIDER_SITE_OTHER): Payer: Medicare Other | Admitting: Internal Medicine

## 2016-03-05 VITALS — BP 136/69 | HR 62 | Ht 66.0 in | Wt 128.4 lb

## 2016-03-05 DIAGNOSIS — I447 Left bundle-branch block, unspecified: Secondary | ICD-10-CM

## 2016-03-05 DIAGNOSIS — I471 Supraventricular tachycardia: Secondary | ICD-10-CM

## 2016-03-05 NOTE — Progress Notes (Signed)
so      Patient Care Team: Lajean Manes, MD as PCP - General (Internal Medicine)   HPI  Yvonne [name] is a 82 y.o. female Seen in followup for SVT for which she has elected  medical therapy. Echocardiogram 2010 demonstrated mild left ventricular dysfunction. She is chronic left bundle branch block.  She's had 1 episode of atrial fibrillation that lasted about 4 hours. It left her quite fatigued.  After trial of various beta blockers and calcium blockers, she has been taking Inderal LA.  She is tolerating propafenone  Date  QRS duration   none 150  6/16 Propafenone 150 168  5/17 propafenone 150 176   She is doing well on her SSRI.   I asked her where she met her husband, she said it was a secret and changed  the subject.   Past Medical History  Diagnosis Date  . Hemorrhage of gastrointestinal tract, unspecified   . Anxiety state, unspecified   . Left ventricular systolic dysfunction     hx of mild  . History of peptic ulcer   . Esophageal reflux   . Mitral valve insufficiency and aortic valve insufficiency   . Palpitations     history  . Other primary cardiomyopathies   . Wears glasses   . Other left bundle branch block   . Paroxysmal supraventricular tachycardia ([place])   . LBBB (left bundle branch block)   . Cataract   . Cancer (HCC)     basil cell carcinomas removed  . Arthritis     Past Surgical History  Procedure Laterality Date  . Esophagogastroduodenoscopy    . Tonsillectomy    . Appendectomy    . Abdominal hysterectomy    . Tubal ligation    . Knee arthroscopy  2009    left  . Breast surgery      biopsy  . Open reduction internal fixation (orif) metacarpal Left 11/05/2013    Procedure: CLOSED REDUCTION/PINNING VS OPEN REDUCTION INTERNAL FIXATION (ORIF) LEFT SMALL  METACARPAL FRACTURE ;  Surgeon: Cammie Sickle., MD;  Location: [facility];  Service: Orthopedics;  Laterality: Left;  . Cataract extraction Left   . Foot surgery  Left     hammer toe and bunion  . Orif humerus fracture Left 04/10/2015    Procedure: OPEN REDUCTION INTERNAL FIXATION (ORIF) PROXIMAL HUMERUS FRACTURE;  Surgeon: Marybelle Killings, MD;  Location: [facility];  Service: Orthopedics;  Laterality: Left;  . Esophagogastroduodenoscopy (egd) with propofol N/A 10/10/2015    Procedure: ESOPHAGOGASTRODUODENOSCOPY (EGD) WITH PROPOFOL;  Surgeon: Garlan Fair, MD;  Location: [facility];  Service: Endoscopy;  Laterality: N/A;    Current Outpatient Prescriptions  Medication Sig Dispense Refill  . Ascorbic Acid (VITAMIN C WITH ROSE HIPS) 500 MG tablet Take 500 mg by mouth daily.    Marland Kitchen aspirin EC 81 MG tablet Take 1 tablet (81 mg total) by mouth daily.    . clorazepate (TRANXENE) 7.5 MG tablet Take 7.5 mg by mouth 3 (three) times daily as needed for anxiety.     . docusate sodium (COLACE) 100 MG capsule Take 100-300 mg by mouth daily as needed (constipation).     Marland Kitchen estradiol (ESTRACE) 1 MG tablet Take 0.5 mg by mouth daily.    . fluticasone (FLONASE) 50 MCG/ACT nasal spray Place 2 sprays into both nostrils daily as needed for allergies.   5  . Multiple Minerals-Vitamins (CAL-MAG-ZINC-D) TABS Take 2 tablets by mouth daily.    Marland Kitchen  Multiple Vitamin (MULTIVITAMIN WITH MINERALS) TABS tablet Take 1 tablet by mouth daily.    . propafenone (RYTHMOL) 150 MG tablet TAKE 1 TABLET BY MOUTH 2 TIMES DAILY. 60 tablet 0  . sertraline (ZOLOFT) 50 MG tablet Take 75 mg by mouth daily.    . sodium chloride (OCEAN) 0.65 % SOLN nasal spray Place 2 sprays into both nostrils daily as needed for congestion.     No current facility-administered medications for this visit.    Allergies  Allergen Reactions  . Aleve [Naproxen Sodium] Other (See Comments)    Gi bleed  . Adhesive [Tape] Rash and Other (See Comments)    Skin redness  . Aspirin Other (See Comments)    Told to avoid high doses due to history of stomach ulcer    Review of Systems negative except from HPI and  PMH  Physical Exam BP 136/69 mmHg  Pulse 62  Ht '5\' 6"'  (1.676 m)  Wt 128 lb 6.4 oz (58.242 kg)  BMI 20.73 kg/m2 Well developed and well nourished in no acute distress HENT normal E scleral and icterus clear Neck Supple JVP flat; Clear to ausculation Regular rate and rhythm, no murmurs gallops or rub Soft with active bowel sounds No clubbing cyanosis  Edema Alert and oriented, grossly normal motor and sensory function Skin Warm and Dry    Assessment and  Plan  SVT  Left bundle branch block  High risk medication surveillance   She is tolerating the propafenone extremely well. She is also tolerating his concomitant use with Inderal.  The left bundle branch block and widened little bit; we will continue the drug for now. 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 She is extremely stressed.  We spent more than 50% of our >25 min visit in face to face counseling regarding the above

## 2016-03-05 NOTE — Patient Instructions (Signed)
Medication Instructions: - Your physician recommends that you continue on your current medications as directed. Please refer to the Current Medication list given to you today.  Labwork: - none  Procedures/Testing: - none  Follow-Up: - Your physician wants you to follow-up in: 6 months with Dr. Klein. You will receive a reminder letter in the mail two months in advance. If you don't receive a letter, please call our office to schedule the follow-up appointment.  Any Additional Special Instructions Will Be Listed Below (If Applicable).     If you need a refill on your cardiac medications before your next appointment, please call your pharmacy.   

## 2016-03-12 ENCOUNTER — Other Ambulatory Visit: Payer: Self-pay | Admitting: Internal Medicine

## 2016-04-11 ENCOUNTER — Encounter: Payer: Self-pay | Admitting: Physician Assistant

## 2016-04-22 ENCOUNTER — Encounter: Payer: Self-pay | Admitting: Physician Assistant

## 2016-04-22 ENCOUNTER — Telehealth: Payer: Self-pay | Admitting: Internal Medicine

## 2016-04-22 ENCOUNTER — Ambulatory Visit (INDEPENDENT_AMBULATORY_CARE_PROVIDER_SITE_OTHER): Payer: Medicare Other | Admitting: Physician Assistant

## 2016-04-22 VITALS — BP 118/79 | HR 104 | Ht 66.0 in | Wt 128.0 lb

## 2016-04-22 DIAGNOSIS — I480 Paroxysmal atrial fibrillation: Secondary | ICD-10-CM

## 2016-04-22 DIAGNOSIS — R0602 Shortness of breath: Secondary | ICD-10-CM

## 2016-04-22 DIAGNOSIS — I4892 Unspecified atrial flutter: Secondary | ICD-10-CM

## 2016-04-22 DIAGNOSIS — I447 Left bundle-branch block, unspecified: Secondary | ICD-10-CM | POA: Diagnosis not present

## 2016-04-22 DIAGNOSIS — I471 Supraventricular tachycardia: Secondary | ICD-10-CM | POA: Diagnosis not present

## 2016-04-22 LAB — CBC
HCT: 39.8 % (ref 35.0–45.0)
Hemoglobin: 13.3 g/dL (ref 11.7–15.5)
MCH: 30.4 pg (ref 27.0–33.0)
MCHC: 33.4 g/dL (ref 32.0–36.0)
MCV: 91.1 fL (ref 80.0–100.0)
MPV: 11.1 fL (ref 7.5–12.5)
PLATELETS: 337 10*3/uL (ref 140–400)
RBC: 4.37 MIL/uL (ref 3.80–5.10)
RDW: 13.8 % (ref 11.0–15.0)
WBC: 7.9 10*3/uL (ref 3.8–10.8)

## 2016-04-22 NOTE — Telephone Encounter (Signed)
Patient has an appointment today with Melina Copa PA at 2:30.

## 2016-04-22 NOTE — Progress Notes (Signed)
Cardiology Office Note    Date:  04/22/2016  ID:  TECLA MCCAGHREN, DOB 1928-11-10, MRN [id number] PCP:  Mathews Argyle, MD  Cardiologist: Dr. Caryl Comes   Chief Complaint: SOB  History of Present Illness:  Yvonne [name] is a 82 y.o. female with history of SVT, one prior episode of atrial fib, h/o peptic ulcer, GERD, LBBB, h/o GIB while taking Aleve who presents for evaluatoin of SOB.  Per review of chart she has h/o SVT for which she's elected medical therapy. His note says "After trial of various beta blockers and calcium blockers, she has been taking Inderal LA" but the patient says she is not taking this and doesn't recall being prescribed it. It was not on her med list at her last visit. She is on propafenone. She is not currently on any anticoagulation and has only had one known episode of afib per Dr. Caryl Comes. Remote echo 2010: mild focal basal hypertrophy of septum, EF 50-55%, mid-distal inferoseptal myocardium, grade 1 DD, LBBB, mild AI/MR, PASP mildly increased.  For the past 3 weeks, she has noticed fatigue, dyspnea (at rest and on exertion), and cough. She called into her PCP and was prescribed cough medicine with codeine but did not take it given her heart history. She went in for an appointment to see his PA and says they were told bloodwork looked OK. She got a CBC that day with WBC 10, Hgb 12.6. She has continued to feel poorly since that time. She denies any chest pain, LEE, orthopnea, dizziness, syncope, or recent bleeding issues. Denies weight gain. EKG today demonstrates atrial flutter vs atrial fib, HR 104. She has been under a lot of stress - husband has severe dementia and she has been his caregiver. They are moving to [place].    Past Medical History  Diagnosis Date  . Hemorrhage of gastrointestinal tract, unspecified     a. occurred on Aleve  . Anxiety state, unspecified   . Left ventricular systolic dysfunction     hx of mild  . History of peptic ulcer   .  Esophageal reflux   . Mitral valve insufficiency and aortic valve insufficiency     a. Remote echo 2010: mild focal basal hypertrophy of septum, EF 50-55%, mid-distal inferoseptal myocardium, grade 1 DD, LBBB, mild AI/MR, PASP mildly increased.  . Wears glasses   . Paroxysmal supraventricular tachycardia ([place])   . LBBB (left bundle branch block)   . Cataract   . Cancer (HCC)     basil cell carcinomas removed  . Arthritis   . Atrial fibrillation ([place])     a. Per Dr. Olin Pia note, one prior episode of afib.    Past Surgical History  Procedure Laterality Date  . Esophagogastroduodenoscopy    . Tonsillectomy    . Appendectomy    . Abdominal hysterectomy    . Tubal ligation    . Knee arthroscopy  2009    left  . Breast surgery      biopsy  . Open reduction internal fixation (orif) metacarpal Left 11/05/2013    Procedure: CLOSED REDUCTION/PINNING VS OPEN REDUCTION INTERNAL FIXATION (ORIF) LEFT SMALL  METACARPAL FRACTURE ;  Surgeon: Cammie Sickle., MD;  Location: [facility];  Service: Orthopedics;  Laterality: Left;  . Cataract extraction Left   . Foot surgery Left     hammer toe and bunion  . Orif humerus fracture Left 04/10/2015    Procedure: OPEN REDUCTION INTERNAL FIXATION (ORIF) PROXIMAL HUMERUS FRACTURE;  Surgeon: Marybelle Killings, MD;  Location: [facility];  Service: Orthopedics;  Laterality: Left;  . Esophagogastroduodenoscopy (egd) with propofol N/A 10/10/2015    Procedure: ESOPHAGOGASTRODUODENOSCOPY (EGD) WITH PROPOFOL;  Surgeon: Garlan Fair, MD;  [facility]: [facility];  Service: Endoscopy;  Laterality: N/A;    Current Medications: Current Outpatient Prescriptions  Medication Sig Dispense Refill  . Ascorbic Acid (VITAMIN C WITH ROSE HIPS) 500 MG tablet Take 500 mg by mouth daily.    Marland Kitchen aspirin EC 81 MG tablet Take 1 tablet (81 mg total) by mouth daily.    . clorazepate (TRANXENE) 7.5 MG tablet Take 7.5 mg by mouth 3 (three) times daily as needed for anxiety.      . docusate sodium (COLACE) 100 MG capsule Take 100-300 mg by mouth daily as needed (constipation).     Marland Kitchen estradiol (ESTRACE) 1 MG tablet Take 0.5 mg by mouth daily.    . fluticasone (FLONASE) 50 MCG/ACT nasal spray Place 2 sprays into both nostrils daily as needed for allergies.   5  . Multiple Minerals-Vitamins (CAL-MAG-ZINC-D) TABS Take 2 tablets by mouth daily.    . Multiple Vitamin (MULTIVITAMIN WITH MINERALS) TABS tablet Take 1 tablet by mouth daily.    . ondansetron (ZOFRAN) 8 MG tablet Take 8 mg by mouth every 8 (eight) hours as needed for nausea or vomiting.   2  . propafenone (RYTHMOL) 150 MG tablet TAKE 1 TABLET BY MOUTH 2 TIMES DAILY. 60 tablet 11  . sertraline (ZOLOFT) 50 MG tablet Take 75 mg by mouth daily.    . sodium chloride (OCEAN) 0.65 % SOLN nasal spray Place 2 sprays into both nostrils daily as needed for congestion.     No current facility-administered medications for this visit.     Allergies:   Aleve; Adhesive; and Aspirin   Social History   Social History  . Marital Status: Married    Spouse Name: N/A  . Number of Children: N/A  . Years of Education: N/A   Social History Main Topics  . Smoking status: Never Smoker   . Smokeless tobacco: Never Used  . Alcohol Use: No  . Drug Use: No  . Sexual Activity: Not Asked   Other Topics Concern  . None   Social History Narrative     Family History:  The patient's family history includes Heart failure in her father; Hypertension in her mother; Stroke in her sister. There is no history of Heart attack.  ROS:   Please see the history of present illness.  All other systems are reviewed and otherwise negative.    PHYSICAL EXAM:   VS:  BP 118/79 mmHg  Pulse 104  Ht 5\' 6"  (1.676 m)  Wt 128 lb (58.06 kg)  BMI 20.67 kg/m2  BMI: Body mass index is 20.67 kg/(m^2). GEN: Well nourished, well developed elderly Wf, in no acute distress HEENT: normocephalic, atraumatic Neck: no JVD, carotid bruits, or  masses Cardiac: borderline elevated HR, irregular; no murmurs, rubs, or gallops, no edema  Respiratory:  Diminished BS at bases, normal work of breathing GI: soft, nontender, nondistended, + BS MS: no deformity or atrophy Skin: warm and dry, no rash Neuro:  Alert and Oriented x 3, Strength and sensation are intact, follows commands Psych: euthymic mood, full affect  Wt Readings from Last 3 Encounters:  04/22/16 128 lb (58.06 kg)  03/05/16 128 lb 6.4 oz (58.242 kg)  10/10/15 130 lb (58.968 kg)      Studies/Labs Reviewed:   EKG:  EKG was ordered today and personally reviewed by me and demonstrates atrial flutter 104bpm with LBBB (vs afib, although there seems to be intrinsic p wave activity) - reviewed with Dr .Rayann Heman   Recent Labs: 04/23/2015: ALT 12*; BUN 15; Creatinine, Ser 0.67; Hemoglobin 11.5*; Platelets 329; Potassium 3.4*; Sodium 138   Lipid Panel No results found for: CHOL, TRIG, HDL, CHOLHDL, VLDL, LDLCALC, LDLDIRECT  Additional studies/ records that were reviewed today include: Summarized above.    ASSESSMENT & PLAN:   1. Shortness of breath with newly recognized atrial flutter (vs afib) - she has been symptomatic for 3 weeks. She was seen by the PA at her PCP's office and reportedly checked out OK at that time so it's not clear how long this rhythm has been going on. I discussed the EKG and clinical situation with Dr. Rayann Heman (DOD). He says he would be concerned about adding rate controlling agents given her underlying conduction disease and age in the setting of propafenone therapy. He recommends attempt at cardioversion either with 3 weeks of anticoagulation prior to doing so, or TEE/DCCV if she does not think she feels well enough to wait. Ms. Divita would like to think about it and will let us know. If she does NOT proceed with TEE, will need to update her echocardiogram. As far as anticoagulation goes, CHADSVASC = 3 indicating a significant risk of stroke that would  warrant blood thinners. I discussed risk/benefit of anticoagulation. Recent labs at PCP's office showed normal Hgb and she denies any hx of GIB since the remote episode while on Aleve. Will update further labwork today. If Cr and Hgb are generally stable, would plan to d/c aspirin and start apixaban 2.5mg  BID (age, weight <60kg). I won't be here tomorrow but I sent a message to one of our covering nurses to help enact this plan. Dr. Rayann Heman suggested she may need to eventually transition off propafenone and possibly onto amiodarone instead. Will defer to the EP team regarding further input. 2. H/o PAF - see above. One prior episode noted before this per Dr. Olin Pia note. 3. Paroxysmal SVT - quiescent. 4. LBBB - chronic.  Disposition: F/u with EP NP/PA in 2 weeks.   Medication Adjustments/Labs and Tests Ordered: Current medicines are reviewed at length with the patient today.  Concerns regarding medicines are outlined above. Medication changes, Labs and Tests ordered today are summarized above and listed in the Patient Instructions accessible in Encounters.   Raechel Ache PA-C  04/22/2016 4:24 PM    [facility] [place], [place], [place]  [address] Phone: [phone]; Fax: [phone]

## 2016-04-22 NOTE — Patient Instructions (Addendum)
Medication Instructions:  Your physician recommends that you continue on your current medications as directed. Please refer to the Current Medication list given to you today.   Labwork: TODAY:  CBC, CMET, MAGNESIUM, TSH, & BNP  Testing/Procedures:   Follow-Up: Your physician recommends that you schedule a follow-up appointment in: 2 WEEKS WITH EP APP   Any Other Special Instructions Will Be Listed Below (If Applicable).     If you need a refill on your cardiac medications before your next appointment, please call your pharmacy.

## 2016-04-22 NOTE — Telephone Encounter (Signed)
Patient complaining of SOB for a couple of weeks, and  patient stated it be came worse last night. Patient stated that she has also had a cough with her SOB. Patient denies swelling or weight gain. Patent went to her PCP last Monday. PA at Dr. Carlyle Lipa office stated her vital signs and blood work are fine, and that she thinks this might be heart related. Patient stated she does have chest pain now and then, but does not think it's related to her heart. Will consult DOD/FLEX for advisement.  Yvonne Merle NP advised the patient be seen in the next couple of days.

## 2016-04-22 NOTE — Telephone Encounter (Signed)
Pt c/o Shortness Of Breath: STAT if SOB developed within the last 24 hours or pt is noticeably SOB on the phone  1. Are you currently SOB (can you hear that pt is SOB on the phone)? no  2. How long have you been experiencing SOB? A month 3. Are you SOB when sitting or when up moving around? both  4. Are you currently experiencing any other symptoms? no

## 2016-04-23 ENCOUNTER — Telehealth: Payer: Self-pay | Admitting: *Deleted

## 2016-04-23 LAB — COMPREHENSIVE METABOLIC PANEL
ALK PHOS: 77 U/L (ref 33–130)
ALT: 31 U/L — AB (ref 6–29)
AST: 26 U/L (ref 10–35)
Albumin: 3.9 g/dL (ref 3.6–5.1)
BILIRUBIN TOTAL: 0.4 mg/dL (ref 0.2–1.2)
BUN: 25 mg/dL (ref 7–25)
CO2: 24 mmol/L (ref 20–31)
CREATININE: 0.74 mg/dL (ref 0.60–0.88)
Calcium: 8.9 mg/dL (ref 8.6–10.4)
Chloride: 102 mmol/L (ref 98–110)
GLUCOSE: 93 mg/dL (ref 65–99)
Potassium: 4.2 mmol/L (ref 3.5–5.3)
Sodium: 137 mmol/L (ref 135–146)
TOTAL PROTEIN: 6.7 g/dL (ref 6.1–8.1)

## 2016-04-23 LAB — TSH: TSH: 1.92 mIU/L

## 2016-04-23 LAB — BRAIN NATRIURETIC PEPTIDE: Brain Natriuretic Peptide: 950.8 pg/mL — ABNORMAL HIGH (ref <100)

## 2016-04-23 LAB — MAGNESIUM: MAGNESIUM: 2.2 mg/dL (ref 1.5–2.5)

## 2016-04-23 MED ORDER — APIXABAN 2.5 MG PO TABS
2.5000 mg | ORAL_TABLET | Freq: Two times a day (BID) | ORAL | Status: DC
Start: 2016-04-23 — End: 2016-05-27

## 2016-04-23 NOTE — Telephone Encounter (Signed)
Pt's daughter returned call and I went over medication changes with her. Reminded daughter about letting us know if they decide on DCCV in 3 weeks or TEE/DCCV sooner. Daughter states that she would like to talk to [facility] in regards to risk factors again as she is a little confused. Daughter states she left yesterday not understanding that they had two options on when to do DCCV and just wants more information from [facility]. Daughter advised that I will send message but Lisbeth Renshaw is out of the office today. Daughter would like Dayna to call her cell when she is able to.  Number is [phone].

## 2016-04-23 NOTE — Telephone Encounter (Signed)
Spoke with pt and informed her of med changes.  Pt verbalized understanding and was in agreement with this plan. Pt agrees to call us when she decides on DCCV vs TEE/DCCV. Pt asked that I call daughter, Edmonia Lynch, with these instructions as well and if she didn't answer to leave a message. Called daughter and left message on confirmed voicemail. Advised to call with any questions. Samples placed at front desk with discount card.

## 2016-04-23 NOTE — Telephone Encounter (Signed)
-----   Message from Charlie Pitter, [place] sent at 04/23/2016  8:04 AM EDT ----- Let patient know labs are OK. BNP pending still but other labs are OK To d/c ASA and start apixaban 2.5mg  BID. Have her let us know what she decides about DCCV in 3 weeks versus TEE/DCCV sooner. Dayna Dunn PA-C

## 2016-04-24 ENCOUNTER — Other Ambulatory Visit: Payer: Self-pay | Admitting: Physician Assistant

## 2016-04-24 ENCOUNTER — Encounter: Payer: Self-pay | Admitting: *Deleted

## 2016-04-24 ENCOUNTER — Telehealth: Payer: Self-pay | Admitting: *Deleted

## 2016-04-24 DIAGNOSIS — I480 Paroxysmal atrial fibrillation: Secondary | ICD-10-CM

## 2016-04-24 DIAGNOSIS — R0602 Shortness of breath: Secondary | ICD-10-CM

## 2016-04-24 NOTE — Telephone Encounter (Signed)
I called patient's daughter and spoke with her depth regarding the TEE/DCCV including risks, benefits, alternatives. They would like to proceed with TEE/DCCV (instead of waiting 3 weeks and doing cardioversion alone). We discussed utmost importance of taking Eliquis faithfully q12hours. The patient has not yet started Eliquis - they plan to pick it up today. She needs at least 5 doses in prior to the TEE/DCCV so the earliest business day this can be scheduled is Monday. Please arrange. Also, she needs a baseline CXR prior to proceeding - I forgot to mention this to her daughter in the midst of our conversation but please arrange.  Dayna Dunn PA-C

## 2016-04-24 NOTE — Telephone Encounter (Signed)
Called pts daughter, Edmonia Lynch, per Melina Copa, PA-C, to set up TEE/DCCV and also a CXR. Left a message for her to call me back.

## 2016-04-25 ENCOUNTER — Telehealth: Payer: Self-pay | Admitting: Internal Medicine

## 2016-04-25 ENCOUNTER — Other Ambulatory Visit: Payer: Self-pay

## 2016-04-25 ENCOUNTER — Encounter (HOSPITAL_COMMUNITY): Payer: Self-pay | Admitting: Emergency Medicine

## 2016-04-25 ENCOUNTER — Inpatient Hospital Stay (HOSPITAL_COMMUNITY)
Admission: EM | Admit: 2016-04-25 | Discharge: 2016-05-01 | DRG: 291 | Disposition: A | Discharge status: Home / Self Care | Payer: Medicare Other | Attending: Internal Medicine | Admitting: Internal Medicine

## 2016-04-25 ENCOUNTER — Emergency Department (HOSPITAL_COMMUNITY): Payer: Medicare Other

## 2016-04-25 DIAGNOSIS — J90 Pleural effusion, not elsewhere classified: Secondary | ICD-10-CM

## 2016-04-25 DIAGNOSIS — I081 Rheumatic disorders of both mitral and tricuspid valves: Secondary | ICD-10-CM | POA: Diagnosis present

## 2016-04-25 DIAGNOSIS — R110 Nausea: Secondary | ICD-10-CM | POA: Diagnosis not present

## 2016-04-25 DIAGNOSIS — Z91048 Other nonmedicinal substance allergy status: Secondary | ICD-10-CM

## 2016-04-25 DIAGNOSIS — I340 Nonrheumatic mitral (valve) insufficiency: Secondary | ICD-10-CM | POA: Diagnosis not present

## 2016-04-25 DIAGNOSIS — F411 Generalized anxiety disorder: Secondary | ICD-10-CM | POA: Diagnosis present

## 2016-04-25 DIAGNOSIS — I351 Nonrheumatic aortic (valve) insufficiency: Secondary | ICD-10-CM

## 2016-04-25 DIAGNOSIS — I5043 Acute on chronic combined systolic (congestive) and diastolic (congestive) heart failure: Secondary | ICD-10-CM | POA: Diagnosis present

## 2016-04-25 DIAGNOSIS — I481 Persistent atrial fibrillation: Secondary | ICD-10-CM | POA: Diagnosis present

## 2016-04-25 DIAGNOSIS — I959 Hypotension, unspecified: Secondary | ICD-10-CM | POA: Diagnosis not present

## 2016-04-25 DIAGNOSIS — I447 Left bundle-branch block, unspecified: Secondary | ICD-10-CM

## 2016-04-25 DIAGNOSIS — Z6820 Body mass index (BMI) 20.0-20.9, adult: Secondary | ICD-10-CM

## 2016-04-25 DIAGNOSIS — L8991 Pressure ulcer of unspecified site, stage 1: Secondary | ICD-10-CM | POA: Diagnosis present

## 2016-04-25 DIAGNOSIS — I4891 Unspecified atrial fibrillation: Secondary | ICD-10-CM | POA: Diagnosis present

## 2016-04-25 DIAGNOSIS — R5381 Other malaise: Secondary | ICD-10-CM | POA: Diagnosis present

## 2016-04-25 DIAGNOSIS — J189 Pneumonia, unspecified organism: Secondary | ICD-10-CM

## 2016-04-25 DIAGNOSIS — E46 Unspecified protein-calorie malnutrition: Secondary | ICD-10-CM | POA: Diagnosis present

## 2016-04-25 DIAGNOSIS — I509 Heart failure, unspecified: Secondary | ICD-10-CM

## 2016-04-25 DIAGNOSIS — Z8711 Personal history of peptic ulcer disease: Secondary | ICD-10-CM

## 2016-04-25 DIAGNOSIS — I110 Hypertensive heart disease with heart failure: Secondary | ICD-10-CM | POA: Diagnosis present

## 2016-04-25 DIAGNOSIS — Z7982 Long term (current) use of aspirin: Secondary | ICD-10-CM

## 2016-04-25 DIAGNOSIS — E876 Hypokalemia: Secondary | ICD-10-CM | POA: Diagnosis not present

## 2016-04-25 DIAGNOSIS — I5021 Acute systolic (congestive) heart failure: Secondary | ICD-10-CM | POA: Diagnosis not present

## 2016-04-25 DIAGNOSIS — Z7901 Long term (current) use of anticoagulants: Secondary | ICD-10-CM

## 2016-04-25 DIAGNOSIS — K219 Gastro-esophageal reflux disease without esophagitis: Secondary | ICD-10-CM | POA: Diagnosis present

## 2016-04-25 DIAGNOSIS — I428 Other cardiomyopathies: Secondary | ICD-10-CM | POA: Diagnosis present

## 2016-04-25 DIAGNOSIS — R0600 Dyspnea, unspecified: Secondary | ICD-10-CM

## 2016-04-25 DIAGNOSIS — J9601 Acute respiratory failure with hypoxia: Secondary | ICD-10-CM | POA: Diagnosis present

## 2016-04-25 DIAGNOSIS — Z886 Allergy status to analgesic agent status: Secondary | ICD-10-CM

## 2016-04-25 DIAGNOSIS — R0602 Shortness of breath: Secondary | ICD-10-CM | POA: Diagnosis present

## 2016-04-25 DIAGNOSIS — Z8249 Family history of ischemic heart disease and other diseases of the circulatory system: Secondary | ICD-10-CM

## 2016-04-25 DIAGNOSIS — L89891 Pressure ulcer of other site, stage 1: Secondary | ICD-10-CM | POA: Diagnosis present

## 2016-04-25 DIAGNOSIS — R7989 Other specified abnormal findings of blood chemistry: Secondary | ICD-10-CM | POA: Diagnosis present

## 2016-04-25 DIAGNOSIS — I480 Paroxysmal atrial fibrillation: Secondary | ICD-10-CM | POA: Diagnosis present

## 2016-04-25 LAB — CBC
HEMATOCRIT: 38.5 % (ref 36.0–46.0)
Hemoglobin: 13 g/dL (ref 12.0–15.0)
MCH: 30.7 pg (ref 26.0–34.0)
MCHC: 33.8 g/dL (ref 30.0–36.0)
MCV: 90.8 fL (ref 78.0–100.0)
PLATELETS: 263 10*3/uL (ref 150–400)
RBC: 4.24 MIL/uL (ref 3.87–5.11)
RDW: 14.2 % (ref 11.5–15.5)
WBC: 7.8 10*3/uL (ref 4.0–10.5)

## 2016-04-25 LAB — BASIC METABOLIC PANEL
Anion gap: 9 (ref 5–15)
BUN: 25 mg/dL — ABNORMAL HIGH (ref 6–20)
CALCIUM: 9 mg/dL (ref 8.9–10.3)
CO2: 23 mmol/L (ref 22–32)
CREATININE: 0.67 mg/dL (ref 0.44–1.00)
Chloride: 103 mmol/L (ref 101–111)
Glucose, Bld: 119 mg/dL — ABNORMAL HIGH (ref 65–99)
Potassium: 4.4 mmol/L (ref 3.5–5.1)
SODIUM: 135 mmol/L (ref 135–145)

## 2016-04-25 LAB — I-STAT TROPONIN, ED: Troponin i, poc: 0.04 ng/mL (ref 0.00–0.08)

## 2016-04-25 LAB — PROCALCITONIN

## 2016-04-25 LAB — BRAIN NATRIURETIC PEPTIDE: B NATRIURETIC PEPTIDE 5: 2057.2 pg/mL — AB (ref 0.0–100.0)

## 2016-04-25 LAB — MAGNESIUM: MAGNESIUM: 1.9 mg/dL (ref 1.7–2.4)

## 2016-04-25 LAB — TROPONIN I
TROPONIN I: 0.06 ng/mL — AB (ref <0.03)
Troponin I: 0.07 ng/mL (ref <0.03)

## 2016-04-25 LAB — D-DIMER, QUANTITATIVE: D-Dimer, Quant: 0.55 ug/mL-FEU — ABNORMAL HIGH (ref 0.00–0.50)

## 2016-04-25 MED ORDER — ASPIRIN EC 81 MG PO TBEC
81.0000 mg | DELAYED_RELEASE_TABLET | Freq: Every day | ORAL | Status: DC
  Administered 2016-04-25: 81 mg via ORAL

## 2016-04-25 MED ORDER — FUROSEMIDE 10 MG/ML IJ SOLN
40.0000 mg | Freq: Two times a day (BID) | INTRAMUSCULAR | Status: DC
  Administered 2016-04-26 (×2): 40 mg via INTRAVENOUS
  Filled 2016-04-25 (×2): qty 4

## 2016-04-25 MED ORDER — ONDANSETRON HCL 4 MG/2ML IJ SOLN
4.0000 mg | Freq: Four times a day (QID) | INTRAMUSCULAR | Status: DC | PRN
  Administered 2016-04-27: 4 mg via INTRAVENOUS
  Filled 2016-04-25: qty 2

## 2016-04-25 MED ORDER — SERTRALINE HCL 50 MG PO TABS
75.0000 mg | ORAL_TABLET | Freq: Every day | ORAL | Status: DC
Start: 2016-04-25 — End: 2016-05-01
  Administered 2016-04-26 – 2016-05-01 (×6): 75 mg via ORAL
  Filled 2016-04-25: qty 2
  Filled 2016-04-25 (×5): qty 1

## 2016-04-25 MED ORDER — POTASSIUM CHLORIDE CRYS ER 20 MEQ PO TBCR
20.0000 meq | EXTENDED_RELEASE_TABLET | Freq: Two times a day (BID) | ORAL | Status: DC
  Administered 2016-04-25 – 2016-04-26 (×3): 20 meq via ORAL
  Filled 2016-04-25 (×3): qty 1

## 2016-04-25 MED ORDER — SODIUM CHLORIDE 0.9% FLUSH: 3.0000 mL | INTRAVENOUS | Status: DC | PRN

## 2016-04-25 MED ORDER — IOPAMIDOL (ISOVUE-370) INJECTION 76%
INTRAVENOUS | Status: AC
  Administered 2016-04-25: 80 mL via INTRAVENOUS
  Filled 2016-04-25: qty 100

## 2016-04-25 MED ORDER — DILTIAZEM HCL 100 MG IV SOLR
5.0000 mg/h | INTRAVENOUS | Status: DC
  Administered 2016-04-25 – 2016-04-27 (×2): 5 mg/h via INTRAVENOUS
  Filled 2016-04-25 (×3): qty 100

## 2016-04-25 MED ORDER — DEXTROSE 5 % IV SOLN
1.0000 g | Freq: Once | INTRAVENOUS | Status: AC
  Administered 2016-04-25: 1 g via INTRAVENOUS
  Filled 2016-04-25: qty 10

## 2016-04-25 MED ORDER — POTASSIUM CHLORIDE CRYS ER 20 MEQ PO TBCR
40.0000 meq | EXTENDED_RELEASE_TABLET | Freq: Once | ORAL | Status: AC
  Administered 2016-04-25: 40 meq via ORAL
  Filled 2016-04-25: qty 2

## 2016-04-25 MED ORDER — ONDANSETRON HCL 4 MG/2ML IJ SOLN: 4.0000 mg | Freq: Four times a day (QID) | INTRAMUSCULAR | Status: DC | PRN

## 2016-04-25 MED ORDER — SODIUM CHLORIDE 0.9 % IV SOLN: 250.0000 mL | INTRAVENOUS | Status: DC | PRN

## 2016-04-25 MED ORDER — APIXABAN 2.5 MG PO TABS: 2.5000 mg | ORAL_TABLET | Freq: Two times a day (BID) | ORAL | Status: DC

## 2016-04-25 MED ORDER — DEXTROSE 5 % IV SOLN
500.0000 mg | Freq: Once | INTRAVENOUS | Status: AC
  Administered 2016-04-25: 500 mg via INTRAVENOUS
  Filled 2016-04-25: qty 500

## 2016-04-25 MED ORDER — FUROSEMIDE 10 MG/ML IJ SOLN
60.0000 mg | Freq: Once | INTRAMUSCULAR | Status: AC
  Administered 2016-04-25: 60 mg via INTRAVENOUS
  Filled 2016-04-25: qty 6

## 2016-04-25 MED ORDER — APIXABAN 2.5 MG PO TABS
2.5000 mg | ORAL_TABLET | Freq: Two times a day (BID) | ORAL | Status: DC
  Administered 2016-04-25 – 2016-05-01 (×12): 2.5 mg via ORAL
  Filled 2016-04-25 (×12): qty 1

## 2016-04-25 MED ORDER — ACETAMINOPHEN 325 MG PO TABS: 650.0000 mg | ORAL_TABLET | ORAL | Status: DC | PRN

## 2016-04-25 MED ORDER — LEVOFLOXACIN IN D5W 500 MG/100ML IV SOLN: 500.0000 mg | Freq: Once | INTRAVENOUS | Status: DC

## 2016-04-25 MED ORDER — CLORAZEPATE DIPOTASSIUM 3.75 MG PO TABS
7.5000 mg | ORAL_TABLET | Freq: Three times a day (TID) | ORAL | Status: DC | PRN
Start: 2016-04-25 — End: 2016-05-01
  Administered 2016-04-26: 7.5 mg via ORAL
  Filled 2016-04-25: qty 2

## 2016-04-25 MED ORDER — ESTRADIOL 1 MG PO TABS
0.5000 mg | ORAL_TABLET | Freq: Every day | ORAL | Status: DC
Start: 2016-04-26 — End: 2016-05-01
  Administered 2016-04-26 – 2016-05-01 (×6): 0.5 mg via ORAL
  Filled 2016-04-25 (×6): qty 0.5

## 2016-04-25 MED ORDER — SODIUM CHLORIDE 0.9% FLUSH
3.0000 mL | Freq: Two times a day (BID) | INTRAVENOUS | Status: DC
  Administered 2016-04-27 – 2016-05-01 (×9): 3 mL via INTRAVENOUS

## 2016-04-25 MED ORDER — ONDANSETRON HCL 4 MG/2ML IJ SOLN
4.0000 mg | Freq: Once | INTRAMUSCULAR | Status: AC
  Administered 2016-04-25: 4 mg via INTRAVENOUS
  Filled 2016-04-25: qty 2

## 2016-04-25 MED ORDER — DILTIAZEM HCL 100 MG IV SOLR: 5.0000 mg/h | INTRAVENOUS | Status: DC

## 2016-04-25 MED ORDER — PROPAFENONE HCL 150 MG PO TABS
150.0000 mg | ORAL_TABLET | Freq: Three times a day (TID) | ORAL | Status: DC
  Administered 2016-04-25 – 2016-04-27 (×5): 150 mg via ORAL
  Filled 2016-04-25 (×6): qty 1

## 2016-04-25 NOTE — ED Provider Notes (Signed)
CSN: [id number]     Arrival date & time 04/25/16  1113 History   First MD Initiated Contact with Patient 04/25/16 1128     Chief Complaint  Patient presents with  . Shortness of Breath     (Consider location/radiation/quality/duration/timing/severity/associated sxs/prior Treatment) HPI  Pt presenting with c/o shortness of breath and nausea.  She states she has been feeling this way for the past several weeks- but symptoms are progressively worsening.  She was seen by cardiology earlier this week and plan for afib ablation Monday- 4 days from now.  Son is concerned because her symptoms are worsening.  No chest pain.  No fainting.  She took her first dose of eliquis last night.  She also has had some cough.  Was seen by her PMD and prescribed cough medication which she didn't feel was safe to take.  No leg swelling.  She has had some dry heaves- has been able to drink small amounts of liquids only.  There are no other associated systemic symptoms, there are no other alleviating or modifying factors.   Past Medical History  Diagnosis Date  . Hemorrhage of gastrointestinal tract, unspecified     a. occurred on Aleve  . Anxiety state, unspecified   . Left ventricular systolic dysfunction     hx of mild  . History of peptic ulcer   . Esophageal reflux   . Mitral valve insufficiency and aortic valve insufficiency     a. Remote echo 2010: mild focal basal hypertrophy of septum, EF 50-55%, mid-distal inferoseptal myocardium, grade 1 DD, LBBB, mild AI/MR, PASP mildly increased.  . Wears glasses   . Paroxysmal supraventricular tachycardia ([place])   . LBBB (left bundle branch block)   . Cataract   . Cancer (HCC)     basil cell carcinomas removed  . Arthritis   . Atrial fibrillation ([place])     a. Per Dr. Olin Pia note, one prior episode of afib.   Past Surgical History  Procedure Laterality Date  . Esophagogastroduodenoscopy    . Tonsillectomy    . Appendectomy    . Abdominal hysterectomy    .  Tubal ligation    . Knee arthroscopy  2009    left  . Breast surgery      biopsy  . Open reduction internal fixation (orif) metacarpal Left 11/05/2013    Procedure: CLOSED REDUCTION/PINNING VS OPEN REDUCTION INTERNAL FIXATION (ORIF) LEFT SMALL  METACARPAL FRACTURE ;  Surgeon: Cammie Sickle., MD;  Location: [place];  Service: Orthopedics;  Laterality: Left;  . Cataract extraction Left   . Foot surgery Left     hammer toe and bunion  . Orif humerus fracture Left 04/10/2015    Procedure: OPEN REDUCTION INTERNAL FIXATION (ORIF) PROXIMAL HUMERUS FRACTURE;  Surgeon: Marybelle Killings, MD;  Location: [place];  Service: Orthopedics;  Laterality: Left;  . Esophagogastroduodenoscopy (egd) with propofol N/A 10/10/2015    Procedure: ESOPHAGOGASTRODUODENOSCOPY (EGD) WITH PROPOFOL;  Surgeon: Garlan Fair, MD;  Location: [facility];  Service: Endoscopy;  Laterality: N/A;   Family History  Problem Relation Age of Onset  . Hypertension Mother   . Heart attack Neg Hx   . Heart failure Father   . Stroke Sister    Social History  Substance Use Topics  . Smoking status: Never Smoker   . Smokeless tobacco: Never Used  . Alcohol Use: No   OB History    No data available     Review of Systems  ROS reviewed and all otherwise negative except for mentioned in HPI    Allergies  Aleve; Adhesive; and Aspirin  Home Medications   Prior to Admission medications   Medication Sig Start Date End Date Taking? Authorizing Provider  apixaban (ELIQUIS) 2.5 MG TABS tablet Take 1 tablet (2.5 mg total) by mouth 2 (two) times daily. 04/23/16  Yes Dayna N Dunn, PA-C  Ascorbic Acid (VITAMIN C WITH ROSE HIPS) 500 MG tablet Take 500 mg by mouth daily.   Yes Historical Provider, MD  aspirin 81 MG tablet Take 81 mg by mouth daily.   Yes Historical Provider, MD  clorazepate (TRANXENE) 7.5 MG tablet Take 7.5 mg by mouth 3 (three) times daily as needed for anxiety.    Yes Historical Provider, MD   docusate sodium (COLACE) 100 MG capsule Take 100-300 mg by mouth daily as needed (constipation).    Yes Historical Provider, MD  estradiol (ESTRACE) 1 MG tablet Take 0.5 mg by mouth daily.   Yes Historical Provider, MD  fluticasone (FLONASE) 50 MCG/ACT nasal spray Place 2 sprays into both nostrils daily as needed for allergies.  02/25/15  Yes Historical Provider, MD  Multiple Minerals-Vitamins (CAL-MAG-ZINC-D) TABS Take 2 tablets by mouth daily.   Yes Historical Provider, MD  Multiple Vitamin (MULTIVITAMIN WITH MINERALS) TABS tablet Take 1 tablet by mouth daily.   Yes Historical Provider, MD  ondansetron (ZOFRAN) 8 MG tablet Take 8 mg by mouth every 8 (eight) hours as needed for nausea or vomiting.  04/04/16  Yes Historical Provider, MD  propafenone (RYTHMOL) 150 MG tablet TAKE 1 TABLET BY MOUTH 2 TIMES DAILY. 03/12/16  Yes Deboraha Sprang, MD  sertraline (ZOLOFT) 50 MG tablet Take 75 mg by mouth daily.   Yes Historical Provider, MD  sodium chloride (OCEAN) 0.65 % SOLN nasal spray Place 2 sprays into both nostrils daily as needed for congestion.   Yes Historical Provider, MD   BP 129/98 mmHg  Pulse 130  Temp(Src) 98 F (36.7 C) (Oral)  Resp 24  Ht 5\' 6"  (1.676 m)  Wt 58.06 kg  BMI 20.67 kg/m2  SpO2 98%  Vitals reviewed Physical Exam  Physical Examination: General appearance - alert, well appearing, and in no distress Mental status - alert, oriented to person, place, and time Eyes - no conjunctival injection no scleral icterus Mouth - mucous membranes moist, pharynx normal without lesions Chest - clear to auscultation, no wheezes, rales or rhonchi, symmetric air entry, increased respiratory effort but able to speak in full sentences Heart - normal rate, regular rhythm, normal S1, S2, no murmurs, rubs, clicks or gallops Abdomen - soft, nontender, nondistended, no masses or organomegaly Neurological - alert, oriented, normal speech Extremities - peripheral pulses normal, no pedal edema, no  clubbing or cyanosis Skin - normal coloration and turgor, no rashes  ED Course  Procedures (including critical care time)  This patients CHA2DS2-VASc Score and unadjusted Ischemic Stroke Rate (% per year) is equal to 3.2 % stroke rate/year from a score of 3  Above score calculated as 1 point each if present [CHF, HTN, DM, Vascular=MI/PAD/Aortic Plaque, Age if 57-74, or Female] Above score calculated as 2 points each if present [Age > 75, or Stroke/TIA/TE]  Labs Review Labs Reviewed  BASIC METABOLIC PANEL - Abnormal; Notable for the following:    Glucose, Bld 119 (*)    BUN 25 (*)    All other components within normal limits  BRAIN NATRIURETIC PEPTIDE - Abnormal; Notable for the following:  B Natriuretic Peptide 2057.2 (*)    All other components within normal limits  D-DIMER, QUANTITATIVE (NOT AT [facility]) - Abnormal; Notable for the following:    D-Dimer, Quant 0.55 (*)    All other components within normal limits  CBC  PROCALCITONIN  I-STAT TROPOININ, ED    Imaging Review Dg Chest 2 View  04/25/2016  CLINICAL DATA:  Cough, shortness of breath for 1 week EXAM: CHEST  2 VIEW COMPARISON:  04/23/2015 FINDINGS: Cardiomegaly again noted. There is small left pleural effusion. Trace right pleural effusion. Bilateral basilar atelectasis or infiltrate left greater than right. Osteopenia and mild degenerative changes thoracic spine. No pulmonary edema. IMPRESSION: Cardiomegaly. Small left pleural effusion. Trace right pleural effusion. Bilateral basilar atelectasis or infiltrate left greater than right. No pulmonary edema. Electronically Signed   By: Lahoma Crocker M.D.   On: 04/25/2016 12:42   Ct Angio Chest Pe W Or Wo Contrast  04/25/2016  CLINICAL DATA:  Increasing shortness of Breath EXAM: CT ANGIOGRAPHY CHEST WITH CONTRAST TECHNIQUE: Multidetector CT imaging of the chest was performed using the standard protocol during bolus administration of intravenous contrast. Multiplanar CT image  reconstructions and MIPs were obtained to evaluate the vascular anatomy. CONTRAST:  80 mL Isovue 370. COMPARISON:  Plain film from earlier in the same day FINDINGS: Mediastinum/Lymph Nodes: Thoracic aorta is incompletely evaluated due to lack of contrast opacification. No definitive aneurysmal dilatation is seen. The pulmonary artery demonstrates no findings to suggest pulmonary embolism. No significant hilar or mediastinal adenopathy is noted. Coronary calcifications are seen. Lungs/Pleura: Large bilateral pleural effusions are noted. Lower lobe consolidation is noted associated with the effusions. No other focal nodule or pneumothorax is seen. Upper abdomen: No acute findings. Musculoskeletal: No chest wall mass or suspicious bone lesions identified. Review of the MIP images confirms the above findings. IMPRESSION: No evidence pulmonary embolism. Large bilateral pleural effusions with associated lower lobe consolidation. Electronically Signed   By: Inez Catalina M.D.   On: 04/25/2016 14:19   I have personally reviewed and evaluated these images and lab results as part of my medical decision-making.   EKG Interpretation None     ED ECG REPORT   Date: 04/25/2016  Rate: 130  Rhythm: rapid atrial fibrillation with rapid ventricular response  QRS Axis: left axis deviation  Intervals: indeterminate  ST/T Wave abnormalities: indeterminate  Conduction Disutrbances:none  Narrative Interpretation:   Old EKG Reviewed: unchanged   MDM   Final diagnoses:  Atrial fibrillation with RVR (HCC)  Shortness of breath  CAP (community acquired pneumonia)  Pleural effusion    Pt presenting with c/o shortness of breath, nausea- she is in afib with RVR  She is currently on propafenone and planned for cardioversion in several days by cardiology- she took first dose of eliquis last night.  Workup shows CXR with pleural effusions, with some consolidation- possibly pneumonia versus fluid overload- her BNP is  increasing from 900 to > 2000.  She has no leukocytosis or fever.  Does have some cough.  Will start on abx for possible CAP.  Consulted with cardiology - they have seen patient in the ED.  Per cardiology notes they had not wanted to start rate control meds as outpatient as she was on propafenone- they advised starting diltiazem drip.  Pt admitted to medical service with cardiology consult.    1:19 PM d/w cardiology, they will consult on patient in the ED.   4:13 PM d/w Erin Hearing, NP with hospitalist- she will come see  patient in the ED for admission.   Alfonzo Beers, MD 04/26/16 [phone]

## 2016-04-25 NOTE — ED Notes (Signed)
Tried calling report. Nurse busy

## 2016-04-25 NOTE — Telephone Encounter (Signed)
F/u Message   Pt son call requesting to speak with RN. Please call back to discuss from following message

## 2016-04-25 NOTE — Telephone Encounter (Signed)
Will forward to Dr. Caryl Comes as an Juluis Rainier.

## 2016-04-25 NOTE — Consult Note (Signed)
ELECTROPHYSIOLOGY CONSULT NOTE    Patient ID: Yvonne [name] MRN: [id number], DOB/AGE: Mar 16, 1929 82 y.o.  Admit date: 04/25/2016 Date of Consult: 04/25/2016  Primary Physician: Mathews Argyle, MD Primary Cardiologist: Dr. Caryl Comes  Reason for Consultation: Atrial fib with a RVR  HPI: Yvonne [name] is a 82 y.o. female with PMHx of one remote episode of AFib, though only a couple days ago seen in the office setting wth c/o 3 weeks of unusul SOB noted to be in AFib/Flutter and started on Eliquis though took her first dose last night though this morning, felt nauseous and did not eat or take her pills, SVT apparently remotely, peptic ulcer, GIB (while taking Aleve some years ago) GERD, LBBB.  She comes to the ER today with c/o increasing SOB/cough.  She reports about 3 weeks, possibly 4 of unusual fatigue, lack of energy, eventually with DOE and increasing SOB, as well as symptoms of PND, sleeping in the recliner in the last week and several pillows prior to that, usually only uses one.  She denies any  Kind of CP, no overt palpitations or awareness of her heart beat.  She denies symptoms of fever.  She is on Propafenone 150mg  BID at home First dose of Eliquis was last night but did not take it this morning   LABS: K+ 4.4 BUN/Creat 25/0.67 P.BNP 2057 H/H 13/38 WBC 7.8 plts 263 Ddimer 0.55 >> CT neg for PE  04/22/16: TSH 1.92  Past Medical History  Diagnosis Date  . Hemorrhage of gastrointestinal tract, unspecified     a. occurred on Aleve  . Anxiety state, unspecified   . Left ventricular systolic dysfunction     hx of mild  . History of peptic ulcer   . Esophageal reflux   . Mitral valve insufficiency and aortic valve insufficiency     a. Remote echo 2010: mild focal basal hypertrophy of septum, EF 50-55%, mid-distal inferoseptal myocardium, grade 1 DD, LBBB, mild AI/MR, PASP mildly increased.  . Wears glasses   . Paroxysmal supraventricular tachycardia ([place])   . LBBB  (left bundle branch block)   . Cataract   . Cancer (HCC)     basil cell carcinomas removed  . Arthritis   . Atrial fibrillation ([place])     a. Per Dr. Olin Pia note, one prior episode of afib.     Surgical History:  Past Surgical History  Procedure Laterality Date  . Esophagogastroduodenoscopy    . Tonsillectomy    . Appendectomy    . Abdominal hysterectomy    . Tubal ligation    . Knee arthroscopy  2009    left  . Breast surgery      biopsy  . Open reduction internal fixation (orif) metacarpal Left 11/05/2013    Procedure: CLOSED REDUCTION/PINNING VS OPEN REDUCTION INTERNAL FIXATION (ORIF) LEFT SMALL  METACARPAL FRACTURE ;  Surgeon: Cammie Sickle., MD;  Location: [place];  Service: Orthopedics;  Laterality: Left;  . Cataract extraction Left   . Foot surgery Left     hammer toe and bunion  . Orif humerus fracture Left 04/10/2015    Procedure: OPEN REDUCTION INTERNAL FIXATION (ORIF) PROXIMAL HUMERUS FRACTURE;  Surgeon: Marybelle Killings, MD;  Location: [place];  Service: Orthopedics;  Laterality: Left;  . Esophagogastroduodenoscopy (egd) with propofol N/A 10/10/2015    Procedure: ESOPHAGOGASTRODUODENOSCOPY (EGD) WITH PROPOFOL;  Surgeon: Garlan Fair, MD;  Location: [facility];  Service: Endoscopy;  Laterality: N/A;      (  Not in a hospital admission)  Inpatient Medications:    Allergies:  Allergies  Allergen Reactions  . Aleve [Naproxen Sodium] Other (See Comments)    Gi bleed  . Adhesive [Tape] Rash and Other (See Comments)    Skin redness  . Aspirin Other (See Comments)    Told to avoid high doses due to history of stomach ulcer    Social History   Social History  . Marital Status: Married    Spouse Name: N/A  . Number of Children: N/A  . Years of Education: N/A   Occupational History  . Not on file.   Social History Main Topics  . Smoking status: Never Smoker   . Smokeless tobacco: Never Used  . Alcohol Use: No  . Drug Use: No  . Sexual  Activity: Not on file   Other Topics Concern  . Not on file   Social History Narrative     Family History  Problem Relation Age of Onset  . Hypertension Mother   . Heart attack Neg Hx   . Heart failure Father   . Stroke Sister      Review of Systems: All other systems reviewed and are otherwise negative except as noted above.  Physical Exam: Filed Vitals:   04/25/16 1145 04/25/16 1200 04/25/16 1215 04/25/16 1245  BP: 116/84 120/86 123/96 121/81  Pulse: 127 122 128 123  Temp:      TempSrc:      Resp: 23 23 24 30   Height:      Weight:      SpO2: 98% 96% 98% 98%    GEN- The patient is frail in appearance, very thin body habitus, in NAD, speaking in full sentences, alert and oriented x 3 today.   HEENT: normocephalic, atraumatic; sclera clear, conjunctiva pink; hearing intact; oropharynx clear; neck supple, no JVP Lymph- no cervical lymphadenopathy Lungs- diminished at the bases bilaterally, normal work of breathing.  No wheezes, rales, rhonchi Heart- Irregular rate and rhythm, tachycardic, no significant murmurs appreciated, no rubs or gallops, PMI not laterally displaced GI- soft, non-tender, non-distended Extremities- no clubbing, cyanosis, or edema MS- no significant deformity or atrophy Skin- warm and dry, no rash or lesion Psych- euthymic mood, full affect Neuro- no gross deficits observed  Labs:   Lab Results  Component Value Date   WBC 7.8 04/25/2016   HGB 13.0 04/25/2016   HCT 38.5 04/25/2016   MCV 90.8 04/25/2016   PLT 263 04/25/2016    Recent Labs Lab 04/22/16 1621 04/25/16 1221  NA 137 135  K 4.2 4.4  CL 102 103  CO2 24 23  BUN 25 25*  CREATININE 0.74 0.67  CALCIUM 8.9 9.0  PROT 6.7  --   BILITOT 0.4  --   ALKPHOS 77  --   ALT 31*  --   AST 26  --   GLUCOSE 93 119*      Radiology/Studies:  Dg Chest 2 View 04/25/2016  CLINICAL DATA:  Cough, shortness of breath for 1 week EXAM: CHEST  2 VIEW COMPARISON:  04/23/2015 FINDINGS:  Cardiomegaly again noted. There is small left pleural effusion. Trace right pleural effusion. Bilateral basilar atelectasis or infiltrate left greater than right. Osteopenia and mild degenerative changes thoracic spine. No pulmonary edema. IMPRESSION: Cardiomegaly. Small left pleural effusion. Trace right pleural effusion. Bilateral basilar atelectasis or infiltrate left greater than right. No pulmonary edema. Electronically Signed   By: Lahoma Crocker M.D.   On: 04/25/2016 12:42   Ct Angio  Chest Pe W Or Wo Contrast 04/25/2016  CLINICAL DATA:  Increasing shortness of Breath EXAM: CT ANGIOGRAPHY CHEST WITH CONTRAST TECHNIQUE: Multidetector CT imaging of the chest was performed using the standard protocol during bolus administration of intravenous contrast. Multiplanar CT image reconstructions and MIPs were obtained to evaluate the vascular anatomy. CONTRAST:  80 mL Isovue 370. COMPARISON:  Plain film from earlier in the same day FINDINGS: Mediastinum/Lymph Nodes: Thoracic aorta is incompletely evaluated due to lack of contrast opacification. No definitive aneurysmal dilatation is seen. The pulmonary artery demonstrates no findings to suggest pulmonary embolism. No significant hilar or mediastinal adenopathy is noted. Coronary calcifications are seen. Lungs/Pleura: Large bilateral pleural effusions are noted. Lower lobe consolidation is noted associated with the effusions. No other focal nodule or pneumothorax is seen. Upper abdomen: No acute findings. Musculoskeletal: No chest wall mass or suspicious bone lesions identified. Review of the MIP images confirms the above findings. IMPRESSION: No evidence pulmonary embolism. Large bilateral pleural effusions with associated lower lobe consolidation. Electronically Signed   By: Inez Catalina M.D.   On: 04/25/2016 14:19    EKG: Afib, LBBB, 134bpm 03/05/16:  SR, 1st degree AVBLock, LBBB, LAD, PR 148ms, QRS 177ms  TELEMETRY: AFib, 110-130  05/02/09: Echocardiogram Study  Conclusions 1. Left ventricle: The cavity size was normal. There was mild focal  basal hypertrophy of the septum. Systolic function was normal.  The estimated ejection fraction was in the range of 50% to 55%.  Possible hypokinesis of the mid-distal inferoseptal myocardium.  Doppler parameters are consistent with abnormal left ventricular  relaxation (grade 1 diastolic dysfunction). 2. Ventricular septum: Septal motion showed dyssynergy. These  changes are consistent with a left bundle branch block. 3. Aortic valve: Mild regurgitation. 4. Mitral valve: Mild regurgitation. 5. Pulmonary arteries: Systolic pressure was mildly increased.  Assessment and Plan:   1. SOB, Fluid OL/?pneumonia, with large b/l pleural effusions on her CT scan with associated lower lobe consolidation     BP stable, afebrile     IM evaluation is pending     Antibiotics started     diuresis      2. AFib     1st dose of Eliquis yesterday (low dose given age/weight), though none today      Rate 110-130, ?secondary to pulmonary, rate was better early this week      Baseline Conduction system disease, was in SR last month      H/H stable      Resume her Eliquis and plan for TEE/DCCV as planned Monday      Rate control given no a/c   3. LBBB is old    Yvonne [name] 04/25/2016 2:28 PM  EP Attending  Patient seen and examined. Agree with the findings as noted above. The patient is a very pleasant 82 year old woman who was found to have recurrent atrial fibrillation with a rapid ventricular response, associated with shortness of breath. She presents for additional valuation. She was noted to have atrial fibrillation with ventricular rates in the 120 range in association with left bundle branch block. The patient has gone over 5 years without a 2-D echo. She had been seen in our office 2 days ago, and was scheduled for a TEE guided cardioversion, and started on anticoagulation  medication. Her exam demonstrates an elderly appearing ill woman who is 82 years old. Lungs reveal rales in the bases with decreased breath sounds bilaterally. Cardiovascular exam revealed irregularly irregular rhythm. Extremities demonstrated no edema. Telemetry demonstrates atrial  fibrillation with a rapid ventricular response. ECG demonstrates atrial fibrillation with left bundle branch block. Impression: 1. Atrial fibrillation with a rapid ventricular response - the patient has been started on intravenous Cardizem. Hopefully her rate will improve. She will continue her anticoagulation and IV AV nodal blocking drugs for the short-term. 2. Acute on chronic heart failure - previously she had normal left ventricular function. She will undergo 2-D echocardiography and medication adjustment if her EF is low. She will maintain a low-sodium diet. She will be treated with intravenous Lasix. 3. Coags - the patient has been started on Eliquis. 4. Valvular heart disease - she had both mitral regurg as well as aortic insufficiency at her echo back in 2010. On exam I do not hear a lot of murmur however it was loud and her heart was beating fast making examination more difficult. We will repeat her 2-D echo.  Yvonne [name], M.D.

## 2016-04-25 NOTE — Telephone Encounter (Signed)
Spoke with daughter, Edmonia Lynch.  She has been made aware of pts TEE/DCCV set up for 04/29/16, arriving at 8:30.  She will take pt to GI to have CXR today or tomorrow, and she will bring her by the office to get PT/INR that hasn't been drawn. Went over procedure instructions.  She is agreeable with this plan and verbalizes understanding.

## 2016-04-25 NOTE — Telephone Encounter (Signed)
/  fu  Pt son wanted top inform RN- he is taking her to [facility].

## 2016-04-25 NOTE — H&P (Signed)
History and Physical    Yvonne [name] [id number] DOB: [birth year] DOA: 04/25/2016   PCP: Mathews Argyle, MD   Patient coming from/Resides with: Private residence/lives with husband who has Alzheimer's disease-plan is to transition to assisted living facility soon  Chief Complaint: Dyspnea on exertion  HPI: Yvonne [name] is a 82 y.o. female with medical history significant for GERD, prior humeral fracture after fall felt to be related to syncopal episode, known left bundle branch block, mitral regurgitation, history of peptic ulcer disease, and remote atrial fibrillation previously only on aspirin; recently reevaluated in the cardiology office at the end of June for progressive shortness of breath for 3 weeks; at that time was found to be in atrial fibrillation and was started on eliquis on 6/28. She has persisted with progressive shortness of breath and because of nausea this morning was unable to take her usual medications. She has noted that about 2 weeks ago she began having extreme dyspnea on exertion was unable to ambulate more than 25 feet without becoming symptomatic. She's had no awareness of any tachypalpitations, she does not have any lower extremity edema. She's had no fevers no chills. Over the past several days she's had a dry to slightly productive cough with clear sputum. She is not normally on Lasix.  ED Course:  Vital signs: PO 1098.3-BP 120/86-pulse 122 to 140-respirations 23 to 35-O2 saturations 96% on nasal cannula oxygen Two-view chest x-ray: Cardiomegaly with small left pleural effusion, trace right pleural effusion, bilateral basilar atelectasis or infiltrate left greater than right, no pulmonary edema CT angiography of the chest with contrast: No evidence of pulmonary embolism; large bilateral pleural effusions with associated lower lobe consolidation Lab data: Sodium 135, potassium 4.4, BUN 25, creatinine 0.67, glucose 119, anion gap 9, BNP 2058, poc troponin  0.04, white count 7800 with no differential obtained, hemoglobin 13, platelets 263,000, d-dimer 0.55 Medications and treatments: Rocephin 1 g IV 1, azithromycin 500 mg IV 1, Zofran 4 mg IV 1  Review of Systems:  In addition to the HPI above,  No Fever-chills, myalgias or other constitutional symptoms No Headache, changes with Vision or hearing, new weakness, tingling, numbness in any extremity, No problems swallowing food or Liquids, indigestion/reflux No Chest pain, palpitations No Abdominal pain, N/V; no melena or hematochezia, no dark tarry stools No dysuria, hematuria or flank pain No new skin rashes, lesions, masses or bruises, No new joints pains-aches No recent weight gain  No polyuria, polydypsia or polyphagia,   Past Medical History  Diagnosis Date  . Hemorrhage of gastrointestinal tract, unspecified     a. occurred on Aleve  . Anxiety state, unspecified   . Left ventricular systolic dysfunction     hx of mild  . History of peptic ulcer   . Esophageal reflux   . Mitral valve insufficiency and aortic valve insufficiency     a. Remote echo 2010: mild focal basal hypertrophy of septum, EF 50-55%, mid-distal inferoseptal myocardium, grade 1 DD, LBBB, mild AI/MR, PASP mildly increased.  . Wears glasses   . Paroxysmal supraventricular tachycardia ([place])   . LBBB (left bundle branch block)   . Cataract   . Cancer (HCC)     basil cell carcinomas removed  . Arthritis   . Atrial fibrillation ([place])     a. Per Dr. Olin Pia note, one prior episode of afib.    Past Surgical History  Procedure Laterality Date  . Esophagogastroduodenoscopy    . Tonsillectomy    . Appendectomy    .  Abdominal hysterectomy    . Tubal ligation    . Knee arthroscopy  2009    left  . Breast surgery      biopsy  . Open reduction internal fixation (orif) metacarpal Left 11/05/2013    Procedure: CLOSED REDUCTION/PINNING VS OPEN REDUCTION INTERNAL FIXATION (ORIF) LEFT SMALL  METACARPAL FRACTURE ;   Surgeon: Cammie Sickle., MD;  Location: [facility];  Service: Orthopedics;  Laterality: Left;  . Cataract extraction Left   . Foot surgery Left     hammer toe and bunion  . Orif humerus fracture Left 04/10/2015    Procedure: OPEN REDUCTION INTERNAL FIXATION (ORIF) PROXIMAL HUMERUS FRACTURE;  Surgeon: Marybelle Killings, MD;  Location: [facility];  Service: Orthopedics;  Laterality: Left;  . Esophagogastroduodenoscopy (egd) with propofol N/A 10/10/2015    Procedure: ESOPHAGOGASTRODUODENOSCOPY (EGD) WITH PROPOFOL;  Surgeon: Garlan Fair, MD;  Location: [facility];  Service: Endoscopy;  Laterality: N/A;    Social History   Social History  . Marital Status: Married    Spouse Name: N/A  . Number of Children: N/A  . Years of Education: N/A   Occupational History  . Not on file.   Social History Main Topics  . Smoking status: Never Smoker   . Smokeless tobacco: Never Used  . Alcohol Use: No  . Drug Use: No  . Sexual Activity: Not on file   Other Topics Concern  . Not on file   Social History Narrative    Mobility: Currently does not use any assistive devices but has a cane and a rolling walker available at home if needed Work history: Not obtained   Allergies  Allergen Reactions  . Aleve [Naproxen Sodium] Other (See Comments)    Gi bleed  . Adhesive [Tape] Rash and Other (See Comments)    Skin redness  . Aspirin Other (See Comments)    Told to avoid high doses due to history of stomach ulcer    Family History  Problem Relation Age of Onset  . Hypertension Mother   . Heart attack Neg Hx   . Heart failure Father   . Stroke Sister      Prior to Admission medications   Medication Sig Start Date End Date Taking? Authorizing Provider  apixaban (ELIQUIS) 2.5 MG TABS tablet Take 1 tablet (2.5 mg total) by mouth 2 (two) times daily. 04/23/16  Yes Dayna N Dunn, PA-C  Ascorbic Acid (VITAMIN C WITH ROSE HIPS) 500 MG tablet Take 500 mg by mouth daily.   Yes  Historical Provider, MD  aspirin 81 MG tablet Take 81 mg by mouth daily.   Yes Historical Provider, MD  clorazepate (TRANXENE) 7.5 MG tablet Take 7.5 mg by mouth 3 (three) times daily as needed for anxiety.    Yes Historical Provider, MD  docusate sodium (COLACE) 100 MG capsule Take 100-300 mg by mouth daily as needed (constipation).    Yes Historical Provider, MD  estradiol (ESTRACE) 1 MG tablet Take 0.5 mg by mouth daily.   Yes Historical Provider, MD  fluticasone (FLONASE) 50 MCG/ACT nasal spray Place 2 sprays into both nostrils daily as needed for allergies.  02/25/15  Yes Historical Provider, MD  Multiple Minerals-Vitamins (CAL-MAG-ZINC-D) TABS Take 2 tablets by mouth daily.   Yes Historical Provider, MD  Multiple Vitamin (MULTIVITAMIN WITH MINERALS) TABS tablet Take 1 tablet by mouth daily.   Yes Historical Provider, MD  ondansetron (ZOFRAN) 8 MG tablet Take 8 mg by mouth every 8 (eight)  hours as needed for nausea or vomiting.  04/04/16  Yes Historical Provider, MD  propafenone (RYTHMOL) 150 MG tablet TAKE 1 TABLET BY MOUTH 2 TIMES DAILY. 03/12/16  Yes Deboraha Sprang, MD  sertraline (ZOLOFT) 50 MG tablet Take 75 mg by mouth daily.   Yes Historical Provider, MD  sodium chloride (OCEAN) 0.65 % SOLN nasal spray Place 2 sprays into both nostrils daily as needed for congestion.   Yes Historical Provider, MD    Physical Exam: Filed Vitals:   04/25/16 1245 04/25/16 1500 04/25/16 1530 04/25/16 1600  BP: 121/81 130/101 129/98 129/91  Pulse: 123 125 130 125  Temp:      TempSrc:      Resp: 30 30 24  35  Height:      Weight:      SpO2: 98% 96% 98% 96%      Constitutional: NAD, calm, comfortable although somewhat pale in appearance-also appears somewhat underweight Eyes: PERRL, lids and conjunctivae normal ENMT: Mucous membranes are moist. Posterior pharynx clear of any exudate or lesions.Normal dentition.  Neck: normal, supple, no masses, no thyromegaly Respiratory: clear to auscultation  bilaterally, no wheezing, no crackles. Normal respiratory effort. No accessory muscle use.  Cardiovascular: Irregular rate and underlying atrial fibrillation rhythm with ventricular response peaking in the 1:30 to 140 range, no obvious murmurs / rubs / gallops. No extremity edema. 2+ pedal pulses. No carotid bruits. 4-5 cm JVD bilaterally Abdomen: no tenderness, no masses palpated. No hepatosplenomegaly. Bowel sounds positive.  Musculoskeletal: no clubbing / cyanosis. No joint deformity upper and lower extremities. Good ROM, no contractures. Normal muscle tone.  Skin: no rashes, ulcers. No induration-she does have a linear area about 5 cm in length overriding the midthoracic spine with redness consistent with stage I decubitus Neurologic: CN 2-12 grossly intact. Sensation intact, DTR normal. Strength 5/5 x all 4 extremities.  Psychiatric: Normal judgment and insight. Alert and oriented x 3. Normal mood.    Labs on Admission: I have personally reviewed following labs and imaging studies  CBC:  Recent Labs Lab 04/22/16 1621 04/25/16 1221  WBC 7.9 7.8  HGB 13.3 13.0  HCT 39.8 38.5  MCV 91.1 90.8  PLT 337 [id number]   Basic Metabolic Panel:  Recent Labs Lab 04/22/16 1621 04/25/16 1221  NA 137 135  K 4.2 4.4  CL 102 103  CO2 24 23  GLUCOSE 93 119*  BUN 25 25*  CREATININE 0.74 0.67  CALCIUM 8.9 9.0  MG 2.2  --    GFR: Estimated Creatinine Clearance: 47.2 mL/min (by C-G formula based on Cr of 0.67). Liver Function Tests:  Recent Labs Lab 04/22/16 1621  AST 26  ALT 31*  ALKPHOS 77  BILITOT 0.4  PROT 6.7  ALBUMIN 3.9   No results for input(s): LIPASE, AMYLASE in the last 168 hours. No results for input(s): AMMONIA in the last 168 hours. Coagulation Profile: No results for input(s): INR, PROTIME in the last 168 hours. Cardiac Enzymes: No results for input(s): CKTOTAL, CKMB, CKMBINDEX, TROPONINI in the last 168 hours. BNP (last 3 results) No results for input(s): PROBNP in  the last 8760 hours. HbA1C: No results for input(s): HGBA1C in the last 72 hours. CBG: No results for input(s): GLUCAP in the last 168 hours. Lipid Profile: No results for input(s): CHOL, HDL, LDLCALC, TRIG, CHOLHDL, LDLDIRECT in the last 72 hours. Thyroid Function Tests: No results for input(s): TSH, T4TOTAL, FREET4, T3FREE, THYROIDAB in the last 72 hours. Anemia Panel: No results for input(s): VITAMINB12,  FOLATE, FERRITIN, TIBC, IRON, RETICCTPCT in the last 72 hours. Urine analysis:    Component Value Date/Time   COLORURINE YELLOW 04/23/2015 2023   APPEARANCEUR CLOUDY* 04/23/2015 2023   LABSPEC 1.014 04/23/2015 2023   PHURINE 7.0 04/23/2015 2023   GLUCOSEU NEGATIVE 04/23/2015 2023   GLUCOSEU NEG mg/dL 10/09/2010 0343   HGBUR NEGATIVE 04/23/2015 2023   BILIRUBINUR NEGATIVE 04/23/2015 2023   KETONESUR 15* 04/23/2015 2023   PROTEINUR NEGATIVE 04/23/2015 2023   UROBILINOGEN 0.2 04/23/2015 2023   NITRITE POSITIVE* 04/23/2015 2023   LEUKOCYTESUR TRACE* 04/23/2015 2023   Sepsis Labs: @LABRCNTIP (procalcitonin:4,lacticidven:4) )No results found for this or any previous visit (from the past 240 hour(s)).   Radiological Exams on Admission: Dg Chest 2 View  04/25/2016  CLINICAL DATA:  Cough, shortness of breath for 1 week EXAM: CHEST  2 VIEW COMPARISON:  04/23/2015 FINDINGS: Cardiomegaly again noted. There is small left pleural effusion. Trace right pleural effusion. Bilateral basilar atelectasis or infiltrate left greater than right. Osteopenia and mild degenerative changes thoracic spine. No pulmonary edema. IMPRESSION: Cardiomegaly. Small left pleural effusion. Trace right pleural effusion. Bilateral basilar atelectasis or infiltrate left greater than right. No pulmonary edema. Electronically Signed   By: Lahoma Crocker M.D.   On: 04/25/2016 12:42   Ct Angio Chest Pe W Or Wo Contrast  04/25/2016  CLINICAL DATA:  Increasing shortness of Breath EXAM: CT ANGIOGRAPHY CHEST WITH CONTRAST  TECHNIQUE: Multidetector CT imaging of the chest was performed using the standard protocol during bolus administration of intravenous contrast. Multiplanar CT image reconstructions and MIPs were obtained to evaluate the vascular anatomy. CONTRAST:  80 mL Isovue 370. COMPARISON:  Plain film from earlier in the same day FINDINGS: Mediastinum/Lymph Nodes: Thoracic aorta is incompletely evaluated due to lack of contrast opacification. No definitive aneurysmal dilatation is seen. The pulmonary artery demonstrates no findings to suggest pulmonary embolism. No significant hilar or mediastinal adenopathy is noted. Coronary calcifications are seen. Lungs/Pleura: Large bilateral pleural effusions are noted. Lower lobe consolidation is noted associated with the effusions. No other focal nodule or pneumothorax is seen. Upper abdomen: No acute findings. Musculoskeletal: No chest wall mass or suspicious bone lesions identified. Review of the MIP images confirms the above findings. IMPRESSION: No evidence pulmonary embolism. Large bilateral pleural effusions with associated lower lobe consolidation. Electronically Signed   By: Inez Catalina M.D.   On: 04/25/2016 14:19    EKG: (Independently reviewed) atrial fibrillation with underlying left bundle branch block, ventricular rate 130 bpm, QTC 517 ms, downsloping ST segments in V5 and V6 but difficult to interpret given underlying left bundle branch block-compared to EKG from June 2016 patient is no longer in sinus rhythm and ST segment sagging is new in leads V5 and V6 and likely a result of rapid rate and suspected LV strain based on clinical presentation  Assessment/Plan Principal Problem:    Acute respiratory failure with hypoxia:   A) Acute on chronic systolic and diastolic heart failure, NYHA class 1    B) Bilateral pleural effusion -Patient presents with progressive dyspnea on exertion in setting of known atrial fibrillation with RVR; suspect tachycardia mediated  systolic dysfunction -Primary modality of treatment for heart failure is rate control (see below) -Lasix 60 mg IV 1 tonight then begin Lasix 40 mg IV every 12 hours tomorrow morning with supplemental potassium -BNP greater than 2000-repeat in a.m. to follow trends -We'll need echocardiogram once rate better controlled -Continue supplemental oxygen and other supportive care -Repeat 2 view chest x-ray in  a.m. to follow effects of diuresis on bilateral pleural effusions -Patient with known mild systolic dysfunction with mild diastolic dysfunction based on echo in 2010 -Patient requires significant diuresis and therefore ACE inhibitor not initiated until: a) patient has achieved adequate diuresis i.e. wish to avoid hypotension with concomitant utilization of antihypertensive agents and b) need results of echocardiogram (ordered for this admission once heart rate controlled) to choose best medications -Despite CT findings of bilateral lower lobe consolidation patient does not have any clinical findings to support pneumonia; as precaution have obtained blood cultures and have requested sputum culture but will not resume antibiotics initial doses given in the ER since can have similar findings with severe heart failure -Check Procalcitonin to assist with clarification  Active Problems:   Atrial fibrillation with RVR  -Primary management per cardiology -Continue Rythmol -Cardizem infusion at 5 mg per hour and do not titrate given risk for inducing symptomatic bradycardia with concomitant use of Rythmol -Continue eliquis (just started 6/28) -May require hospitalization until able to undergo DCCV -CHADVASc = 4     Positive D dimer -D-dimer elevated but CTA of chest negative for PE    Stage 1 decubitus ulcer/LOW THORACIC spine -Patient has lost weight since her fall and humeral fracture last year; she is under significant stress assisting in the care of her husband with Alzheimer's -Patient spine  protrudes and there is a stage I decubitus over the low thoracic spine -Air mattress    Physical deconditioning -PT evaluation once able to tolerate activity in regards to tachycardia    GERD -Was not on PPI or H2 blocker prior to admission    Protein calorie malnutrition  -Nutrition consultation this admission    Anxiety disorder -Continue preadmission Tranxene and Zoloft      DVT prophylaxis: Eliquis  Code Status: Full code Family Communication: Son at bedside  Disposition Plan: Anticipate discharge back to preadmission home environment once medically stable; patient and son tell me that process in place to move both patient and husband to assisted living soon Consults called: [place] Cardiologist on call  Admission status: Stepdown/inpatient     Drystan Reader L. ANP-BC [facility] Pager [phone]   If 7PM-7AM, please contact night-coverage [email or web address] Password [email or web address]  04/25/2016, 5:02 PM

## 2016-04-25 NOTE — Telephone Encounter (Signed)
Pt c/o Shortness Of Breath: STAT if SOB developed within the last 24 hours or pt is noticeably SOB on the phone  1. Are you currently SOB (can you hear that pt is SOB on the phone)? Per pt son- was very SOB over the phone   2. How long have you been experiencing SOB? Has gotten worse the last couple days  3. Are you SOB when sitting or when up moving around? Bad all the time   4. Are you currently experiencing any other symptoms? Nausea

## 2016-04-25 NOTE — ED Notes (Signed)
Attempted to call report x3.  Nurse busy with another pt. Initial report was attempted at 1830.

## 2016-04-25 NOTE — ED Notes (Signed)
Pt c/o sob x 2wks but worse today. Pt states she is scheduled for an ablation Monday for A-fib. Pt denies any chest pain

## 2016-04-26 ENCOUNTER — Inpatient Hospital Stay (HOSPITAL_COMMUNITY): Payer: Medicare Other

## 2016-04-26 DIAGNOSIS — L8991 Pressure ulcer of unspecified site, stage 1: Secondary | ICD-10-CM

## 2016-04-26 DIAGNOSIS — I509 Heart failure, unspecified: Secondary | ICD-10-CM

## 2016-04-26 LAB — ECHOCARDIOGRAM COMPLETE
CHL CUP MV DEC (S): 173
CHL CUP STROKE VOLUME: 39 mL
E decel time: 173 msec
FS: 14 % — AB (ref 28–44)
Height: 66 in
IV/PV OW: 0.93
LA ID, A-P, ES: 43 mm
LA vol: 79.8 mL
LADIAMINDEX: 2.56 cm/m2
LAVOLA4C: 71.6 mL
LAVOLIN: 47.5 mL/m2
LDCA: 2.84 cm2
LEFT ATRIUM END SYS DIAM: 43 mm
LV PW d: 10.9 mm — AB (ref 0.6–1.1)
LV SIMPSON'S DISK: 39
LV dias vol index: 60 mL/m2
LV dias vol: 100 mL (ref 46–106)
LV sys vol index: 36 mL/m2
LV sys vol: 61 mL — AB (ref 14–42)
LVOT VTI: 9.85 cm
LVOT diameter: 19 mm
LVOTPV: 77.6 cm/s
LVOTSV: 28 mL
MVPG: 13 mmHg
MVPKEVEL: 180 m/s
P 1/2 time: 413 ms
PISA EROA: 0.72 cm2
RV TAPSE: 9.23 mm
VTI: 70.4 cm
Weight: 2132.8 oz

## 2016-04-26 LAB — HIV ANTIBODY (ROUTINE TESTING W REFLEX): HIV Screen 4th Generation wRfx: NONREACTIVE

## 2016-04-26 LAB — BRAIN NATRIURETIC PEPTIDE: B NATRIURETIC PEPTIDE 5: 1137.8 pg/mL — AB (ref 0.0–100.0)

## 2016-04-26 LAB — BASIC METABOLIC PANEL
ANION GAP: 7 (ref 5–15)
BUN: 18 mg/dL (ref 6–20)
CO2: 28 mmol/L (ref 22–32)
Calcium: 8.4 mg/dL — ABNORMAL LOW (ref 8.9–10.3)
Chloride: 102 mmol/L (ref 101–111)
Creatinine, Ser: 0.73 mg/dL (ref 0.44–1.00)
Glucose, Bld: 94 mg/dL (ref 65–99)
Potassium: 3.6 mmol/L (ref 3.5–5.1)
Sodium: 137 mmol/L (ref 135–145)

## 2016-04-26 LAB — MRSA PCR SCREENING: MRSA by PCR: NEGATIVE

## 2016-04-26 LAB — TROPONIN I: TROPONIN I: 0.04 ng/mL — AB (ref <0.03)

## 2016-04-26 MED ORDER — ENSURE ENLIVE PO LIQD
237.0000 mL | Freq: Two times a day (BID) | ORAL | Status: DC
  Administered 2016-04-26 – 2016-05-01 (×7): 237 mL via ORAL

## 2016-04-26 NOTE — Progress Notes (Signed)
Echocardiogram 2D Echocardiogram has been performed.  Yvonne [name] 04/26/2016, 4:33 PM

## 2016-04-26 NOTE — Discharge Instructions (Addendum)

## 2016-04-26 NOTE — Consult Note (Signed)
WOC wound consult note Reason for Consult: thoracic spine Stage 1 Pressure injury Wound type: blanchable redness along thoracic spine. Patient with prominent thoracic spine from kyphosis Several areas of redness but they do blanch.  Patient with some raised moles in this area also, she is followed routinely by her dermatologist. No open wounds Dressing procedure/placement/frequency: No topical care needed.  Discussed POC with patient and bedside nurse.  Re consult if needed, will not follow at this time. Thanks  Hermie Reagor [employer], RN,CNS, [place] [phone])

## 2016-04-26 NOTE — Evaluation (Signed)
Physical Therapy Evaluation Patient Details Name: Yvonne [name] MRN: [id number] DOB: 06/14/1929 Today's Date: 04/26/2016   History of Present Illness  82 y.o. female admitted to [facility]/25/16 for worsening dyspnea.  Dx with bil pleural effusions, acute respiratory failure with hypoxia due to acute on chronic systolic and diastolic heart failure, A-fib with RVR, (+) D dimer (negative PE), and stage 1 decubitus on her lower thoracic spine.  Pt with significant PMHx of anxiety, left ventricular systolic dysfunction, mitral valve insufficiency and aortic valve insufficiency, paroxysmal supraventricular tachycardia, LBBB, A-fib, L knee arthroscopy, ORIF L wrist, L foot surgery, ORIF L humerus fx.     Clinical Impression  Pt was mildly unsteady on her feet, but has mostly been in the bed or chair for >24 hours and has not walked.  HR and O2 sats as well as DOE remained stable during gait on RA.   PT to follow acutely for deficits listed below.       Follow Up Recommendations No PT follow up    Equipment Recommendations  None recommended by PT    Recommendations for Other Services    NA    Precautions / Restrictions Precautions Precautions: Fall Precaution Comments: h/o falls, however, most rescent seemed to be related to a syncopal event.  Restrictions Weight Bearing Restrictions: No      Mobility  Bed Mobility Overal bed mobility: Modified Independent                Transfers Overall transfer level: Needs assistance Equipment used: None Transfers: Sit to/from Stand Sit to Stand: Supervision         General transfer comment: supervision for safety  Ambulation/Gait Ambulation/Gait assistance: Min guard Ambulation Distance (Feet): 300 Feet Assistive device: None Gait Pattern/deviations: Step-through pattern;Staggering left;Staggering right     General Gait Details: mildly staggering gait pattern with min guard assist for safety.  Pt seemed to be able to catch herself  when she staggerd, but therapist there just in case.          Balance Overall balance assessment: Needs assistance Sitting-balance support: Feet supported;No upper extremity supported Sitting balance-Leahy Scale: Good     Standing balance support: Bilateral upper extremity supported;No upper extremity supported;Single extremity supported Standing balance-Leahy Scale: Good                               Pertinent Vitals/Pain Pain Assessment: No/denies pain    Home Living Family/patient expects to be discharged to:: Private residence Living Arrangements: Spouse/significant other (with Alzheimer's dementia) Available Help at Discharge: Family;Available 24 hours/day Type of Home: House Home Access: Stairs to enter Entrance Stairs-Rails: Right;Left;Can reach both Entrance Stairs-Number of Steps: 5 Home Layout: One level Home Equipment: Walker - 2 wheels;Walker - 4 wheels      Prior Function Level of Independence: Independent         Comments: Pt does not use an assistive device at baseline, Daughter reports they are soon to move into [place].      Hand Dominance   Dominant Hand: Right    Extremity/Trunk Assessment   Upper Extremity Assessment: RUE deficits/detail;LUE deficits/detail RUE Deficits / Details: decreased overhead ROM     LUE Deficits / Details: Decreased overhead ROM   Lower Extremity Assessment: Generalized weakness      Cervical / Trunk Assessment: Kyphotic  Communication   Communication: No difficulties  Cognition Arousal/Alertness: Awake/alert Behavior During Therapy: WFL for tasks assessed/performed  Overall Cognitive Status: Within Functional Limits for tasks assessed                      General Comments General comments (skin integrity, edema, etc.): HR 115-low 120s during gait, O2 sats dropped to 89% on RA, but remained mostly in the low 90s with DOE 2/4.            Assessment/Plan    PT Assessment  Patient needs continued PT services  PT Diagnosis Difficulty walking;Abnormality of gait;Generalized weakness   PT Problem List Decreased strength;Decreased activity tolerance;Decreased balance;Decreased knowledge of use of DME  PT Treatment Interventions DME instruction;Gait training;Stair training;Functional mobility training;Therapeutic activities;Therapeutic exercise;Balance training;Patient/family education   PT Goals (Current goals can be found in the Care Plan section) Acute Rehab PT Goals Patient Stated Goal: to go home after her proceedure on Monday  PT Goal Formulation: With patient Time For Goal Achievement: 05/10/16 Potential to Achieve Goals: Good    Frequency Min 3X/week           End of Session Equipment Utilized During Treatment: Gait belt Activity Tolerance: Patient tolerated treatment well Patient left: in chair;with call bell/phone within reach;with family/visitor present Nurse Communication: Mobility status         Time: [id number] PT Time Calculation (min) (ACUTE ONLY): 25 min   Charges:   PT Evaluation $PT Eval Moderate Complexity: 1 Procedure PT Treatments $Gait Training: 8-22 mins        Eleen Litz B. [place], [place], DPT [phone]   04/26/2016, 6:06 PM

## 2016-04-26 NOTE — Progress Notes (Signed)
Initial Nutrition Assessment  DOCUMENTATION CODES:   Not applicable  INTERVENTION:    Vanilla or Chocolate Ensure Enlive po BID, each supplement provides 350 kcal and 20 grams of protein  NUTRITION DIAGNOSIS:   Inadequate oral intake related to acute illness, poor appetite as evidenced by per patient/family report.  GOAL:   Patient will meet greater than or equal to 90% of their needs  MONITOR:   PO intake, Supplement acceptance, Labs, Weight trends, I & O's  REASON FOR ASSESSMENT:   Consult Assessment of nutrition requirement/status  ASSESSMENT:   82 year old female with history of GERD, peptic ulcer disease, history of remote atrial fibrillation, started on Eliquis yesterday by EP in anticipation for TEE and cardioversion next Monday by Dr. Caryl Comes, she presents with progressive dyspnea, workup significant for acute CHF with bilateral effusions, targeted on IV diuresis ,as well found in A. fib with RVR, started on Cardizem drip.   Patient reports that she lost ~15 lbs last summer, but has gained ~1/3 of it back.  PTA for ~2-3 days she was eating nothing, only drinking Sprite because she was nauseous and was having trouble breathing. Appetite has now improved.  She drinks Premier Protein shakes at home. Agreed to vanilla or chocolate Ensure Enlive while she's in the hospital. Nutrition-Focused physical exam completed. Findings are no fat depletion, severe muscle depletion, and no edema.   Diet Order:  Diet Heart Room service appropriate?: Yes; Fluid consistency:: Thin  Skin:  Wound (see comment) (stage 1 pressure injury to spine)  Last BM:  unknown  Height:   Ht Readings from Last 1 Encounters:  04/25/16 5\' 6"  (1.676 m)    Weight:   Wt Readings from Last 1 Encounters:  04/26/16 133 lb 4.8 oz (60.464 kg)    Ideal Body Weight:  59.1 kg  BMI:  Body mass index is 21.53 kg/(m^2).  Estimated Nutritional Needs:   Kcal:  1400-1600  Protein:  70-85 gm  Fluid:   >/= 1.5 L  EDUCATION NEEDS:   No education needs identified at this time  Molli Barrows, [place], [place], [place] Pager [phone] After Hours Pager [phone]

## 2016-04-26 NOTE — Progress Notes (Signed)
SUBJECTIVE: The patient is much better today.  At this time, she denies chest pain, much less SOB, no ongoing nausea  . apixaban  2.5 mg Oral BID  . estradiol  0.5 mg Oral Daily  . feeding supplement (ENSURE ENLIVE)  237 mL Oral BID BM  . furosemide  40 mg Intravenous BID  . potassium chloride  20 mEq Oral BID  . propafenone  150 mg Oral Q8H  . sertraline  75 mg Oral Daily  . sodium chloride flush  3 mL Intravenous Q12H   . diltiazem (CARDIZEM) infusion 5 mg/hr (04/25/16 1724)    OBJECTIVE: Physical Exam: Filed Vitals:   04/26/16 0330 04/26/16 0332 04/26/16 0500 04/26/16 0720  BP: 100/68 100/68  108/71  Pulse: 114 106  112  Temp:  98.1 F (36.7 C)  97.6 F (36.4 C)  TempSrc:  Oral  Oral  Resp: 21 21  26   Height:      Weight:   133 lb 4.8 oz (60.464 kg)   SpO2: 97% 96%  96%    Intake/Output Summary (Last 24 hours) at 04/26/16 1103 Last data filed at 04/26/16 0900  Gross per 24 hour  Intake    710 ml  Output   2275 ml  Net  -1565 ml    Telemetry reveals AFib, 90-100 overnight, 100-120 this morning  GEN- The patient is thin, appears in NAD, alert and oriented x 3 today.   Head- normocephalic, atraumatic Eyes-  Sclera clear, conjunctiva pink Ears- hearing intact Oropharynx- clear Neck- supple, no JVP Lungs- diminished at the bases bilaterally, normal work of breathing Heart- Irregular rate and rhythm, no significant murmurs, no rubs or gallops GI- soft, NT, ND Extremities- no clubbing, cyanosis, or edema Skin- no rash or lesion Psych- euthymic mood, full affect Neuro- no gross deficits appreciated  LABS: Basic Metabolic Panel:  Recent Labs  04/25/16 1221 04/25/16 2233 04/26/16 0529  NA 135  --  137  K 4.4  --  3.6  CL 103  --  102  CO2 23  --  28  GLUCOSE 119*  --  94  BUN 25*  --  18  CREATININE 0.67  --  0.73  CALCIUM 9.0  --  8.4*  MG  --  1.9  --    CBC:  Recent Labs  04/25/16 1221  WBC 7.8  HGB 13.0  HCT 38.5  MCV 90.8  PLT 263     Cardiac Enzymes:  Recent Labs  04/25/16 1800 04/25/16 2233 04/26/16 0529  TROPONINI 0.07* 0.06* 0.04*   BNP: Invalid input(s): POCBNP D-Dimer:  Recent Labs  04/25/16 1221  DDIMER 0.55*    ASSESSMENT AND PLAN:   1. SOB, CHF  remains with stable BP, afebrile continue IV diuresis     Fluid negative so far -1513ml     BNP down some     Renal function stable     IM addressing K+ replacement     Echo is pending   2. AFib, RVR  Eliquis started yesterday PM Rate with some improvement on diltiazem gtt, 90-120s Baseline Conduction system disease, was in SR last month H/H stable Rate control given had not been on a/c      Scheduled for TEE/ DCCV Monday  3. LBBB is old  Tommye Standard, [place] 04/26/2016 11:03 AM   Seen and examined Significantly improved from presentation. She is a new onset atrial fibrillation with a rapid rate. Her blood pressure is borderline. She is scheduled  for TEE cardioversion on Monday and is on ELIQUIS in anticipation of that  Given the fact that she is significantly better, and her blood pressure is borderline, I would be inclined to let her heart rates run faster rather than push aggressively rate control

## 2016-04-26 NOTE — Progress Notes (Signed)
PROGRESS NOTE  Yvonne [name]  [id number] DOB: October 04, 1929 DOA: 04/25/2016 PCP: Mathews Argyle, MD  Brief Narrative:   The patient is an 82 year old female with history of GERD, prior humeral fracture after a fall secondary to a syncopal episode, left bundle branch block, mitral regurgitation, remote atrial fibrillation previously on aspirin. She was evaluated at the end of June for progressive shortness of breath by cardiology and was found to be in atrial fibrillation. She was started on L it was on 6/28. She presented to the hospital with worsening dyspnea. In the emergency department, her heart rate was in the 120s to 140s, blood pressure stable. She was breathing 20-30 times a minute and was placed on oxygen via nasal cannula. Her chest x-ray demonstrated a small left pleural effusion and trace right pleural effusion and no pulmonary edema. CT of the chest demonstrated large bilateral pleural effusions with associated lower lobe consolidation versus edema. Her BNP was 2058. Troponin was negative. She was seen by electrophysiology/cardiology and admitted for further evaluation to the stepdown unit.  Assessment & Plan:   Principal Problem:   Acute on chronic systolic and diastolic heart failure, NYHA class 1 (HCC) Active Problems:   GERD   Atrial fibrillation with RVR (HCC)   Bilateral pleural effusion   Acute respiratory failure with hypoxia (HCC)   Positive D dimer   Protein calorie malnutrition (HCC)   Stage 1 decubitus ulcer/LOW THORACIC spine   Physical deconditioning  Acute respiratory failure with hypoxia secondary to acute on chronic systolic and diastolic heart failure, most likely the latter in the setting of a-fib with RVR.  Bilateral pleural effusions likely related to heart failure.   -  Continue lasix 40mg  IV BID -  Daily weights -  Neg 979mL overnight -  Doubt pneumonia in absence of leukocytosis and fevers.  Also, procalcitonin was negative -  Antibiotics  discontinued  Atrial fibrillation with RVR, CHADVASc = 4, still tachycardic to the 110s-120s.   -  Consider beta blocker or titration of dilt gtt, but will defer to cardiology since this patient is on propafenone -  Continue eliquis -  Anticipate DCCV on Monday   Positive D dimer -D-dimer elevated but CTA of chest negative for PE   Stage 1 decubitus ulcer/LOW THORACIC spine -Patient has lost weight since her fall and humeral fracture last year; she is under significant stress assisting in the care of her husband with Alzheimer's -Patient spine protrudes and there is a stage I decubitus over the low thoracic spine -Air mattress   Physical deconditioning - PT evaluation once able to tolerate activity in regards to tachycardia   GERD -Was not on PPI or H2 blocker prior to admission   Protein calorie malnutrition  -Nutrition consultation this admission   Anxiety disorder -Continue preadmission Tranxene and Zoloft   DVT prophylaxis: Eliquis  Code Status: Full code Family Communication:  Patient alone Disposition Plan: Anticipate discharge back to preadmission home environment once medically stable; patient and son tell me that process in place to move both patient and husband to assisted living soon  Consultants:   EP, Dr. Lovena Le  Procedures:  None  Antimicrobials:   Ceftriaxone and azithromycin 1    Subjective: Patient states that she continues to feel somewhat Yvonne [name] of breath but much better than when she came into the hospital. Her palpitations are less obvious this time. She denies chest pains. Her cough has improved.  Objective: Filed Vitals:   04/26/16 0332 04/26/16 0500 04/26/16  0720 04/26/16 1123  BP: 100/68  108/71   Pulse: 106  112   Temp: 98.1 F (36.7 C)  97.6 F (36.4 C) 97.5 F (36.4 C)  TempSrc: Oral  Oral Oral  Resp: 21  26   Height:      Weight:  60.464 kg (133 lb 4.8 oz)    SpO2: 96%  96%     Intake/Output Summary (Last 24 hours)  at 04/26/16 1319 Last data filed at 04/26/16 1228  Gross per 24 hour  Intake    710 ml  Output   2700 ml  Net  -1990 ml   Filed Weights   04/25/16 1126 04/25/16 2010 04/26/16 0500  Weight: 58.06 kg (128 lb) 58.287 kg (128 lb 8 oz) 60.464 kg (133 lb 4.8 oz)    Examination:  General exam:  Adult Female.  Tachypnea with mild SCM retractions HEENT:  NCAT, MMM Respiratory system:  Rales and expiratory wheeze bilaterally, no rhonchi, and diminished at the bilateral bases  Cardiovascular system: Irregular rate and rhythm, tachycardic, 2/6 systolic murmur.  Warm extremities Gastrointestinal system: Normal active bowel sounds, soft, nondistended, nontender. MSK:  Normal tone and bulk, no lower extremity edema Neuro:  Grossly intact    Data Reviewed: I have personally reviewed following labs and imaging studies  CBC:  Recent Labs Lab 04/22/16 1621 04/25/16 1221  WBC 7.9 7.8  HGB 13.3 13.0  HCT 39.8 38.5  MCV 91.1 90.8  PLT 337 [id number]   Basic Metabolic Panel:  Recent Labs Lab 04/22/16 1621 04/25/16 1221 04/25/16 2233 04/26/16 0529  NA 137 135  --  137  K 4.2 4.4  --  3.6  CL 102 103  --  102  CO2 24 23  --  28  GLUCOSE 93 119*  --  94  BUN 25 25*  --  18  CREATININE 0.74 0.67  --  0.73  CALCIUM 8.9 9.0  --  8.4*  MG 2.2  --  1.9  --    GFR: Estimated Creatinine Clearance: 48.1 mL/min (by C-G formula based on Cr of 0.73). Liver Function Tests:  Recent Labs Lab 04/22/16 1621  AST 26  ALT 31*  ALKPHOS 77  BILITOT 0.4  PROT 6.7  ALBUMIN 3.9   No results for input(s): LIPASE, AMYLASE in the last 168 hours. No results for input(s): AMMONIA in the last 168 hours. Coagulation Profile: No results for input(s): INR, PROTIME in the last 168 hours. Cardiac Enzymes:  Recent Labs Lab 04/25/16 1800 04/25/16 2233 04/26/16 0529  TROPONINI 0.07* 0.06* 0.04*   BNP (last 3 results) No results for input(s): PROBNP in the last 8760 hours. HbA1C: No results for  input(s): HGBA1C in the last 72 hours. CBG: No results for input(s): GLUCAP in the last 168 hours. Lipid Profile: No results for input(s): CHOL, HDL, LDLCALC, TRIG, CHOLHDL, LDLDIRECT in the last 72 hours. Thyroid Function Tests: No results for input(s): TSH, T4TOTAL, FREET4, T3FREE, THYROIDAB in the last 72 hours. Anemia Panel: No results for input(s): VITAMINB12, FOLATE, FERRITIN, TIBC, IRON, RETICCTPCT in the last 72 hours. Urine analysis:    Component Value Date/Time   COLORURINE YELLOW 04/23/2015 2023   APPEARANCEUR CLOUDY* 04/23/2015 2023   LABSPEC 1.014 04/23/2015 2023   PHURINE 7.0 04/23/2015 2023   GLUCOSEU NEGATIVE 04/23/2015 2023   GLUCOSEU NEG mg/dL 10/09/2010 0343   HGBUR NEGATIVE 04/23/2015 2023   BILIRUBINUR NEGATIVE 04/23/2015 2023   KETONESUR 15* 04/23/2015 2023   PROTEINUR NEGATIVE 04/23/2015 2023  UROBILINOGEN 0.2 04/23/2015 2023   NITRITE POSITIVE* 04/23/2015 2023   LEUKOCYTESUR TRACE* 04/23/2015 2023   Sepsis Labs: @LABRCNTIP (procalcitonin:4,lacticidven:4)  ) Recent Results (from the past 240 hour(s))  MRSA PCR Screening     Status: None   Collection Time: 04/25/16 10:16 PM  Result Value Ref Range Status   MRSA by PCR NEGATIVE NEGATIVE Final    Comment:        The GeneXpert MRSA Assay (FDA approved for NASAL specimens only), is one component of a comprehensive MRSA colonization surveillance program. It is not intended to diagnose MRSA infection nor to guide or monitor treatment for MRSA infections.       Radiology Studies: Dg Chest 2 View  04/25/2016  CLINICAL DATA:  Cough, shortness of breath for 1 week EXAM: CHEST  2 VIEW COMPARISON:  04/23/2015 FINDINGS: Cardiomegaly again noted. There is small left pleural effusion. Trace right pleural effusion. Bilateral basilar atelectasis or infiltrate left greater than right. Osteopenia and mild degenerative changes thoracic spine. No pulmonary edema. IMPRESSION: Cardiomegaly. Small left pleural  effusion. Trace right pleural effusion. Bilateral basilar atelectasis or infiltrate left greater than right. No pulmonary edema. Electronically Signed   By: Lahoma Crocker M.D.   On: 04/25/2016 12:42   Ct Angio Chest Pe W Or Wo Contrast  04/25/2016  CLINICAL DATA:  Increasing shortness of Breath EXAM: CT ANGIOGRAPHY CHEST WITH CONTRAST TECHNIQUE: Multidetector CT imaging of the chest was performed using the standard protocol during bolus administration of intravenous contrast. Multiplanar CT image reconstructions and MIPs were obtained to evaluate the vascular anatomy. CONTRAST:  80 mL Isovue 370. COMPARISON:  Plain film from earlier in the same day FINDINGS: Mediastinum/Lymph Nodes: Thoracic aorta is incompletely evaluated due to lack of contrast opacification. No definitive aneurysmal dilatation is seen. The pulmonary artery demonstrates no findings to suggest pulmonary embolism. No significant hilar or mediastinal adenopathy is noted. Coronary calcifications are seen. Lungs/Pleura: Large bilateral pleural effusions are noted. Lower lobe consolidation is noted associated with the effusions. No other focal nodule or pneumothorax is seen. Upper abdomen: No acute findings. Musculoskeletal: No chest wall mass or suspicious bone lesions identified. Review of the MIP images confirms the above findings. IMPRESSION: No evidence pulmonary embolism. Large bilateral pleural effusions with associated lower lobe consolidation. Electronically Signed   By: Inez Catalina M.D.   On: 04/25/2016 14:19     Scheduled Meds: . apixaban  2.5 mg Oral BID  . estradiol  0.5 mg Oral Daily  . feeding supplement (ENSURE ENLIVE)  237 mL Oral BID BM  . furosemide  40 mg Intravenous BID  . potassium chloride  20 mEq Oral BID  . propafenone  150 mg Oral Q8H  . sertraline  75 mg Oral Daily  . sodium chloride flush  3 mL Intravenous Q12H   Continuous Infusions: . diltiazem (CARDIZEM) infusion 5 mg/hr (04/25/16 1724)     LOS: 1 day     Time spent: 30 min    Janece Canterbury, MD [facility] Pager [phone]  If 7PM-7AM, please contact night-coverage [email or web address] Password [email or web address] 04/26/2016, 1:19 PM

## 2016-04-27 ENCOUNTER — Other Ambulatory Visit: Payer: Self-pay

## 2016-04-27 ENCOUNTER — Inpatient Hospital Stay (HOSPITAL_COMMUNITY): Payer: Medicare Other

## 2016-04-27 DIAGNOSIS — J90 Pleural effusion, not elsewhere classified: Secondary | ICD-10-CM

## 2016-04-27 LAB — BLOOD GAS, ARTERIAL
ACID-BASE EXCESS: 1.9 mmol/L (ref 0.0–2.0)
Acid-Base Excess: 3.6 mmol/L — ABNORMAL HIGH (ref 0.0–2.0)
Bicarbonate: 26 mEq/L — ABNORMAL HIGH (ref 20.0–24.0)
Bicarbonate: 27.3 mEq/L — ABNORMAL HIGH (ref 20.0–24.0)
DELIVERY SYSTEMS: POSITIVE
DELIVERY SYSTEMS: POSITIVE
DRAWN BY: 213381
Drawn by: 283381
Expiratory PAP: 5
Expiratory PAP: 5
FIO2: 100
FIO2: 0.4
INSPIRATORY PAP: 15
INSPIRATORY PAP: 15
Mode: POSITIVE
O2 Saturation: 97.7 %
O2 Saturation: 96.9 %
PCO2 ART: 41.8 mmHg (ref 35.0–45.0)
PH ART: 7.411 (ref 7.350–7.450)
PO2 ART: 93.7 mmHg (ref 80.0–100.0)
Patient temperature: 98.6
Patient temperature: 98.6
RATE: 10 resp/min
TCO2: 27.3 mmol/L (ref 0–100)
TCO2: 28.5 mmol/L (ref 0–100)
pCO2 arterial: 39.4 mmHg (ref 35.0–45.0)
pH, Arterial: 7.455 — ABNORMAL HIGH (ref 7.350–7.450)
pO2, Arterial: 114 mmHg — ABNORMAL HIGH (ref 80.0–100.0)

## 2016-04-27 LAB — COMPREHENSIVE METABOLIC PANEL
ALBUMIN: 3.2 g/dL — AB (ref 3.5–5.0)
ALT: 56 U/L — ABNORMAL HIGH (ref 14–54)
ANION GAP: 7 (ref 5–15)
AST: 26 U/L (ref 15–41)
Alkaline Phosphatase: 81 U/L (ref 38–126)
BUN: 23 mg/dL — AB (ref 6–20)
CHLORIDE: 96 mmol/L — AB (ref 101–111)
CO2: 29 mmol/L (ref 22–32)
Calcium: 8.3 mg/dL — ABNORMAL LOW (ref 8.9–10.3)
Creatinine, Ser: 0.86 mg/dL (ref 0.44–1.00)
GFR, EST NON AFRICAN AMERICAN: 60 mL/min — AB (ref >60)
Glucose, Bld: 108 mg/dL — ABNORMAL HIGH (ref 65–99)
POTASSIUM: 3.4 mmol/L — AB (ref 3.5–5.1)
SODIUM: 132 mmol/L — AB (ref 135–145)
TOTAL PROTEIN: 6 g/dL — AB (ref 6.5–8.1)
Total Bilirubin: 0.7 mg/dL (ref 0.3–1.2)

## 2016-04-27 LAB — CBC WITH DIFFERENTIAL/PLATELET
BASOS ABS: 0 10*3/uL (ref 0.0–0.1)
BASOS PCT: 0 %
EOS ABS: 0 10*3/uL (ref 0.0–0.7)
Eosinophils Relative: 0 %
HCT: 40.5 % (ref 36.0–46.0)
HEMOGLOBIN: 13.1 g/dL (ref 12.0–15.0)
LYMPHS ABS: 1.4 10*3/uL (ref 0.7–4.0)
Lymphocytes Relative: 13 %
MCH: 29.7 pg (ref 26.0–34.0)
MCHC: 32.3 g/dL (ref 30.0–36.0)
MCV: 91.8 fL (ref 78.0–100.0)
Monocytes Absolute: 0.9 10*3/uL (ref 0.1–1.0)
Monocytes Relative: 8 %
NEUTROS PCT: 79 %
Neutro Abs: 8.2 10*3/uL — ABNORMAL HIGH (ref 1.7–7.7)
Platelets: 250 10*3/uL (ref 150–400)
RBC: 4.41 MIL/uL (ref 3.87–5.11)
RDW: 14 % (ref 11.5–15.5)
WBC: 10.5 10*3/uL (ref 4.0–10.5)

## 2016-04-27 LAB — BASIC METABOLIC PANEL
ANION GAP: 9 (ref 5–15)
BUN: 22 mg/dL — ABNORMAL HIGH (ref 6–20)
CALCIUM: 8.6 mg/dL — AB (ref 8.9–10.3)
CHLORIDE: 99 mmol/L — AB (ref 101–111)
CO2: 26 mmol/L (ref 22–32)
CREATININE: 0.8 mg/dL (ref 0.44–1.00)
GFR calc non Af Amer: >60 mL/min (ref >60)
Glucose, Bld: 131 mg/dL — ABNORMAL HIGH (ref 65–99)
Potassium: 3.2 mmol/L — ABNORMAL LOW (ref 3.5–5.1)
SODIUM: 134 mmol/L — AB (ref 135–145)

## 2016-04-27 LAB — TROPONIN I
Troponin I: 0.03 ng/mL (ref <0.03)
Troponin I: 0.03 ng/mL (ref <0.03)

## 2016-04-27 LAB — LACTIC ACID, PLASMA: LACTIC ACID, VENOUS: 1 mmol/L (ref 0.5–1.9)

## 2016-04-27 LAB — PROCALCITONIN: Procalcitonin: <0.1 ng/mL

## 2016-04-27 MED ORDER — FUROSEMIDE 10 MG/ML IJ SOLN: 40.0000 mg | Freq: Two times a day (BID) | INTRAMUSCULAR | Status: DC

## 2016-04-27 MED ORDER — CARVEDILOL 6.25 MG PO TABS
6.2500 mg | ORAL_TABLET | Freq: Two times a day (BID) | ORAL | Status: DC
  Administered 2016-04-27 – 2016-04-28 (×2): 6.25 mg via ORAL
  Filled 2016-04-27 (×2): qty 1

## 2016-04-27 MED ORDER — FUROSEMIDE 10 MG/ML IJ SOLN
60.0000 mg | Freq: Once | INTRAMUSCULAR | Status: AC
  Administered 2016-04-27: 60 mg via INTRAVENOUS

## 2016-04-27 MED ORDER — POTASSIUM CHLORIDE CRYS ER 20 MEQ PO TBCR
20.0000 meq | EXTENDED_RELEASE_TABLET | Freq: Three times a day (TID) | ORAL | Status: DC
  Administered 2016-04-27: 20 meq via ORAL
  Filled 2016-04-27: qty 1

## 2016-04-27 MED ORDER — LISINOPRIL 5 MG PO TABS
2.5000 mg | ORAL_TABLET | Freq: Every day | ORAL | Status: DC
  Administered 2016-04-27: 2.5 mg via ORAL
  Filled 2016-04-27: qty 1

## 2016-04-27 MED ORDER — FUROSEMIDE 10 MG/ML IJ SOLN
INTRAMUSCULAR | Status: AC
  Filled 2016-04-27: qty 8

## 2016-04-27 MED ORDER — FUROSEMIDE 10 MG/ML IJ SOLN
40.0000 mg | Freq: Three times a day (TID) | INTRAMUSCULAR | Status: DC
  Administered 2016-04-27: 40 mg via INTRAVENOUS
  Filled 2016-04-27: qty 4

## 2016-04-27 MED ORDER — WHITE PETROLATUM GEL
Status: AC
  Administered 2016-04-27: 0.2
  Filled 2016-04-27: qty 1

## 2016-04-27 NOTE — Progress Notes (Addendum)
SUBJECTIVE: The patient converted to sinus rhythm.  She actually feels that SOB and cough are worse.  At this time, she denies chest pain.    Marland Kitchen apixaban  2.5 mg Oral BID  . estradiol  0.5 mg Oral Daily  . feeding supplement (ENSURE ENLIVE)  237 mL Oral BID BM  . furosemide  40 mg Intravenous TID  . potassium chloride  20 mEq Oral TID  . propafenone  150 mg Oral Q8H  . sertraline  75 mg Oral Daily  . sodium chloride flush  3 mL Intravenous Q12H      OBJECTIVE: Physical Exam: Filed Vitals:   04/26/16 2340 04/27/16 0342 04/27/16 0343 04/27/16 0701  BP: 92/65  98/62 97/60  Pulse: 114  116 84  Temp: 97.8 F (36.6 C) 98.6 F (37 C) 98.6 F (37 C) 97.4 F (36.3 C)  TempSrc: Oral Oral Oral Oral  Resp: 26  20 32  Height:      Weight:   132 lb 12.8 oz (60.238 kg)   SpO2: 94%  96% 93%    Intake/Output Summary (Last 24 hours) at 04/27/16 [id number] Last data filed at 04/26/16 2338  Gross per 24 hour  Intake    480 ml  Output   1600 ml  Net  -1120 ml    Telemetry reveals afib has converted to sinus rhythm  GEN- The patient is thin, appears in NAD, alert and oriented x 3 today.   Head- normocephalic, atraumatic Eyes-  Sclera clear, conjunctiva pink Ears- hearing intact Oropharynx- clear Neck- supple, no JVP Lungs- diminished at the bases bilaterally, normal work of breathing Heart- Irregular rate and rhythm, no significant murmurs, no rubs or gallops GI- soft, NT, ND Extremities- no clubbing, cyanosis, or edema Skin- no rash or lesion Psych- euthymic mood, full affect Neuro- no gross deficits appreciated  LABS: Basic Metabolic Panel:  Recent Labs  04/25/16 2233 04/26/16 0529 04/27/16 0350  NA  --  137 134*  K  --  3.6 3.2*  CL  --  102 99*  CO2  --  28 26  GLUCOSE  --  94 131*  BUN  --  18 22*  CREATININE  --  0.73 0.80  CALCIUM  --  8.4* 8.6*  MG 1.9  --   --    CBC:  Recent Labs  04/25/16 1221  WBC 7.8  HGB 13.0  HCT 38.5  MCV 90.8  PLT 263    Cardiac Enzymes:  Recent Labs  04/25/16 1800 04/25/16 2233 04/26/16 0529  TROPONINI 0.07* 0.06* 0.04*   BNP: Invalid input(s): POCBNP D-Dimer:  Recent Labs  04/25/16 1221  DDIMER 0.55*    ASSESSMENT AND PLAN:   1. SOB, CHF  remains with stable BP, afebrile may have to back off on diuresis soon (will reduce to BID today).  Appears to be approaching euvolemia     Renal function stable     IM addressing K+ replacement     Echo reveals reduced EF.  Likely due to RVR.  Will continue rhythmol for now but may be better to convert to another agent (amiodarone) once SOB and nausea are improved.   2. AFib, RVR  Eliquis started this admission now off IV diltiazem still on rhythmol,  As above, will consider amiodarone but will discuss with Dr Caryl Comes first  3. LBBB     Caution with AV nodal agents and Ics  4. Acute systolic dysfunction     Likely due to  AF with RVR.       Will start low dose coreg and lisinopril today.  Titrate as able     Reduce lasix to BID today  Thompson Grayer MD, [facility] 04/27/2016 9:29 AM

## 2016-04-27 NOTE — Progress Notes (Addendum)
PROGRESS NOTE  Yvonne [name]  [id number] DOB: 03/27/1929 DOA: 04/25/2016 PCP: Mathews Argyle, MD  Brief Narrative:   The patient is an 82 year old female with history of GERD, prior humeral fracture after a fall secondary to a syncopal episode, left bundle branch block, mitral regurgitation, remote atrial fibrillation previously on aspirin. She was evaluated at the end of June for progressive shortness of breath by cardiology and was found to be in atrial fibrillation. She was started on L it was on 6/28. She presented to the hospital with worsening dyspnea. In the emergency department, her heart rate was in the 120s to 140s, blood pressure stable. She was breathing 20-30 times a minute and was placed on oxygen via nasal cannula. Her chest x-ray demonstrated a small left pleural effusion and trace right pleural effusion and no pulmonary edema. CT of the chest demonstrated large bilateral pleural effusions with associated lower lobe consolidation versus edema. Her BNP was 2058. Troponin was negative. She was seen by electrophysiology/cardiology and admitted for further evaluation to the stepdown unit.  Assessment & Plan:   Principal Problem:   Acute on chronic systolic and diastolic heart failure, NYHA class 1 (HCC) Active Problems:   GERD   Atrial fibrillation with RVR (HCC)   Bilateral pleural effusion   Acute respiratory failure with hypoxia (HCC)   Positive D dimer   Protein calorie malnutrition (HCC)   Stage 1 decubitus ulcer/LOW THORACIC spine   Physical deconditioning  Acute respiratory failure with hypoxia secondary to acute on chronic systolic and diastolic heart failure, most likely the latter in the setting of a-fib with RVR, spontaneously cardioverted overnight.  Worsening respiratory distress this morning despite diuresis and 1.7 L yesterday. No evidence of underlying infection as patient has been afebrile, white blood cell count is normal, pro-calcitonin is negative.  Unclear why heart failure has worsened, other than she was in persistent atrial fibrillation with RVR or she has systolic heart failure and had been on negative inotropes for the last few days.  She denies chest pain, but obviously ACS could be contributing. -  Stat chest x-ray: Worsening pulmonary edema and effusions -  Urgent BiPAP, patient should wear BiPAP overnight tonight. Trial off BiPAP in the morning -  Increase lasix  -  Diltiazem discontinued -  Discontinue ACE inhibitor secondary to hypotension -  Hold parameter placed on beta blocker for hypertension -  Cycle troponins again -  ST segment elevations appear unchanged from prior  Atrial fibrillation with RVR, CHADVASc = 4, spontaneously converted to sinus rhythm overnight   - Discontinued diltiazem and Rythmol -  Case discussed with cardiology -  Continue eliquis  Hypotension, likely secondary to progressive heart failure and diuresis with multiple antihypertensives -  Holding some hypertension medications -  Continuing diuresis as mentation allows   Positive D dimer -D-dimer elevated but CTA of chest negative for PE   Stage 1 decubitus ulcer/LOW THORACIC spine -Patient has lost weight since her fall and humeral fracture last year; she is under significant stress assisting in the care of her husband with Alzheimer's -Patient spine protrudes and there is a stage I decubitus over the low thoracic spine -Air mattress   Physical deconditioning - PT evaluation once able to tolerate activity in regards to tachycardia   GERD -Was not on PPI or H2 blocker prior to admission   Protein calorie malnutrition  -Nutrition consultation this admission   Anxiety disorder -Continue preadmission Tranxene and Zoloft   DVT prophylaxis: Eliquis  Code Status: Full code Family Communication:  Patient alone Disposition Plan:   Will need careful diuresis given hypotension   Consultants:   EP, Dr. Lovena Le, Dr. Rayann Heman    Procedures:  None  Antimicrobials:   Ceftriaxone and azithromycin 1    Subjective:  Feels much worse this morning. She is very Monterius Rolf of breath and has been coughing all night.  Objective: Filed Vitals:   04/27/16 0701 04/27/16 1100 04/27/16 1245 04/27/16 1529  BP: 97/60 115/71  88/54  Pulse: 84 80 80 67  Temp: 97.4 F (36.3 C) 97.6 F (36.4 C)  97.8 F (36.6 C)  TempSrc: Oral Oral  Axillary  Resp: 32 32 29 17  Height:      Weight:      SpO2: 93% 95% 100% 96%    Intake/Output Summary (Last 24 hours) at 04/27/16 1825 Last data filed at 04/27/16 1617  Gross per 24 hour  Intake    480 ml  Output   1975 ml  Net  -1495 ml   Filed Weights   04/25/16 2010 04/26/16 0500 04/27/16 0343  Weight: 58.287 kg (128 lb 8 oz) 60.464 kg (133 lb 4.8 oz) 60.238 kg (132 lb 12.8 oz)    Examination:  General exam:  Adult Female.  Tachypnea with SCM retractions, Moderate respiratory distress  HEENT:  NCAT, MMM Respiratory system:  Rale To the apices bilaterally , and diminished at the bilateral bases  Cardiovascular system:  Regular rate and rhythm, 2/6 systolic murmur.  Warm extremities Gastrointestinal system: Normal active bowel sounds, soft, nondistended, nontender. MSK:  Decreased tone and bulk Neuro:  Grossly intact,     Data Reviewed: I have personally reviewed following labs and imaging studies  CBC:  Recent Labs Lab 04/22/16 1621 04/25/16 1221 04/27/16 1448  WBC 7.9 7.8 10.5  NEUTROABS  --   --  8.2*  HGB 13.3 13.0 13.1  HCT 39.8 38.5 40.5  MCV 91.1 90.8 91.8  PLT 337 263 [id number]   Basic Metabolic Panel:  Recent Labs Lab 04/22/16 1621 04/25/16 1221 04/25/16 2233 04/26/16 0529 04/27/16 0350 04/27/16 1448  NA 137 135  --  137 134* 132*  K 4.2 4.4  --  3.6 3.2* 3.4*  CL 102 103  --  102 99* 96*  CO2 24 23  --  28 26 29   GLUCOSE 93 119*  --  94 131* 108*  BUN 25 25*  --  18 22* 23*  CREATININE 0.74 0.67  --  0.73 0.80 0.86  CALCIUM 8.9 9.0  --  8.4*  8.6* 8.3*  MG 2.2  --  1.9  --   --   --    GFR: Estimated Creatinine Clearance: 44.8 mL/min (by C-G formula based on Cr of 0.86). Liver Function Tests:  Recent Labs Lab 04/22/16 1621 04/27/16 1448  AST 26 26  ALT 31* 56*  ALKPHOS 77 81  BILITOT 0.4 0.7  PROT 6.7 6.0*  ALBUMIN 3.9 3.2*   No results for input(s): LIPASE, AMYLASE in the last 168 hours. No results for input(s): AMMONIA in the last 168 hours. Coagulation Profile: No results for input(s): INR, PROTIME in the last 168 hours. Cardiac Enzymes:  Recent Labs Lab 04/25/16 1800 04/25/16 2233 04/26/16 0529 04/27/16 1448  TROPONINI 0.07* 0.06* 0.04* 0.03*   BNP (last 3 results) No results for input(s): PROBNP in the last 8760 hours. HbA1C: No results for input(s): HGBA1C in the last 72 hours. CBG: No results for input(s): GLUCAP in the  last 168 hours. Lipid Profile: No results for input(s): CHOL, HDL, LDLCALC, TRIG, CHOLHDL, LDLDIRECT in the last 72 hours. Thyroid Function Tests: No results for input(s): TSH, T4TOTAL, FREET4, T3FREE, THYROIDAB in the last 72 hours. Anemia Panel: No results for input(s): VITAMINB12, FOLATE, FERRITIN, TIBC, IRON, RETICCTPCT in the last 72 hours. Urine analysis:    Component Value Date/Time   COLORURINE YELLOW 04/23/2015 2023   APPEARANCEUR CLOUDY* 04/23/2015 2023   LABSPEC 1.014 04/23/2015 2023   PHURINE 7.0 04/23/2015 2023   GLUCOSEU NEGATIVE 04/23/2015 2023   GLUCOSEU NEG mg/dL 10/09/2010 0343   HGBUR NEGATIVE 04/23/2015 2023   BILIRUBINUR NEGATIVE 04/23/2015 2023   KETONESUR 15* 04/23/2015 2023   PROTEINUR NEGATIVE 04/23/2015 2023   UROBILINOGEN 0.2 04/23/2015 2023   NITRITE POSITIVE* 04/23/2015 2023   LEUKOCYTESUR TRACE* 04/23/2015 2023   Sepsis Labs: @LABRCNTIP (procalcitonin:4,lacticidven:4)  ) Recent Results (from the past 240 hour(s))  MRSA PCR Screening     Status: None   Collection Time: 04/25/16 10:16 PM  Result Value Ref Range Status   MRSA by PCR  NEGATIVE NEGATIVE Final    Comment:        The GeneXpert MRSA Assay (FDA approved for NASAL specimens only), is one component of a comprehensive MRSA colonization surveillance program. It is not intended to diagnose MRSA infection nor to guide or monitor treatment for MRSA infections.   Culture, blood (routine x 2) Call MD if unable to obtain prior to antibiotics being given     Status: None (Preliminary result)   Collection Time: 04/25/16 10:44 PM  Result Value Ref Range Status   Specimen Description BLOOD LEFT ARM  Final   Special Requests IN PEDIATRIC BOTTLE 3ML  Final   Culture NO GROWTH 2 DAYS  Final   Report Status PENDING  Incomplete  Culture, blood (routine x 2) Call MD if unable to obtain prior to antibiotics being given     Status: None (Preliminary result)   Collection Time: 04/25/16 10:50 PM  Result Value Ref Range Status   Specimen Description BLOOD RIGHT ARM  Final   Special Requests BOTTLES DRAWN AEROBIC AND ANAEROBIC 5ML  Final   Culture NO GROWTH 2 DAYS  Final   Report Status PENDING  Incomplete      Radiology Studies: Dg Chest 2 View  04/26/2016  CLINICAL DATA:  Cough for 2 weeks EXAM: CHEST  2 VIEW COMPARISON:  04/25/2016 FINDINGS: Cardiomediastinal silhouette is stable. Bilateral moderate pleural effusion with bilateral basilar atelectasis or infiltrate. Osteopenia and degenerative changes mid thoracic spine. No pulmonary edema. IMPRESSION: No pulmonary edema. Bilateral moderate pleural effusion with bilateral basilar atelectasis or infiltrate. Electronically Signed   By: Lahoma Crocker M.D.   On: 04/26/2016 14:29   Dg Chest Port 1 View  04/27/2016  CLINICAL DATA:  Dyspnea EXAM: PORTABLE CHEST 1 VIEW COMPARISON:  Chest radiograph from earlier today. FINDINGS: Surgical hardware is seen in the proximal left humerus. Stable cardiomediastinal silhouette with mild cardiomegaly. No pneumothorax. Stable small bilateral pleural effusions. Mild to moderate pulmonary edema  appears slightly worsened. Patchy bibasilar lung opacities appear stable. IMPRESSION: 1. Mild-to-moderate congestive heart failure, slightly worsened. 2. Stable bilateral pleural effusions and bibasilar lung opacities favoring atelectasis. Electronically Signed   By: Ilona Sorrel M.D.   On: 04/27/2016 13:17   Dg Chest Port 1 View  04/27/2016  CLINICAL DATA:  Followup pleural effusions, dyspnea EXAM: PORTABLE CHEST 1 VIEW COMPARISON:  04/26/16 FINDINGS: Cardiac shadow is enlarged. Bilateral pleural effusions are again seen. There  is likely some underlying atelectasis as well. Increase in the degree of vascular congestion is noted consistent with congestive failure. No bony abnormality is noted. IMPRESSION: Stable bibasilar effusion and atelectasis. Increased vascular congestion consistent with CHF. Electronically Signed   By: Inez Catalina M.D.   On: 04/27/2016 07:31     Scheduled Meds: . apixaban  2.5 mg Oral BID  . carvedilol  6.25 mg Oral BID WC  . estradiol  0.5 mg Oral Daily  . feeding supplement (ENSURE ENLIVE)  237 mL Oral BID BM  . sertraline  75 mg Oral Daily  . sodium chloride flush  3 mL Intravenous Q12H   Continuous Infusions:     LOS: 2 days    Time spent: 30 min    Janece Canterbury, MD [facility] Pager [phone]  If 7PM-7AM, please contact night-coverage [email or web address] Password [facility] 04/27/2016, 6:25 PM

## 2016-04-27 NOTE — Progress Notes (Signed)
Pt has changed from last night until now, she is coughing a lot more than last night, she is also a little pale, complaining of nausea (gave zofran about hour ago), had to put on nasal cannula with O2 at 2L because saturation was at 88%. Pt states that she doesn't feel good and that she feels a lot worsen than she did. Will pas to RNspcn

## 2016-04-27 NOTE — Progress Notes (Signed)
Placed patient on BiPAP for the night. She is tolerating it well and RT will continue to monitor throughout the night.

## 2016-04-28 DIAGNOSIS — R5381 Other malaise: Secondary | ICD-10-CM

## 2016-04-28 LAB — BASIC METABOLIC PANEL
Anion gap: 8 (ref 5–15)
BUN: 19 mg/dL (ref 6–20)
CHLORIDE: 96 mmol/L — AB (ref 101–111)
CO2: 30 mmol/L (ref 22–32)
Calcium: 8.3 mg/dL — ABNORMAL LOW (ref 8.9–10.3)
Creatinine, Ser: 0.92 mg/dL (ref 0.44–1.00)
GFR calc Af Amer: >60 mL/min (ref >60)
GFR calc non Af Amer: 55 mL/min — ABNORMAL LOW (ref >60)
GLUCOSE: 92 mg/dL (ref 65–99)
POTASSIUM: 3.1 mmol/L — AB (ref 3.5–5.1)
Sodium: 134 mmol/L — ABNORMAL LOW (ref 135–145)

## 2016-04-28 LAB — TROPONIN I

## 2016-04-28 MED ORDER — METOPROLOL TARTRATE 5 MG/5ML IV SOLN
2.5000 mg | Freq: Four times a day (QID) | INTRAVENOUS | Status: DC | PRN
  Administered 2016-04-29 – 2016-04-30 (×2): 2.5 mg via INTRAVENOUS
  Filled 2016-04-28 (×2): qty 5

## 2016-04-28 MED ORDER — FUROSEMIDE 10 MG/ML IJ SOLN
40.0000 mg | Freq: Two times a day (BID) | INTRAMUSCULAR | Status: DC
  Administered 2016-04-28: 40 mg via INTRAVENOUS
  Filled 2016-04-28: qty 4

## 2016-04-28 MED ORDER — SODIUM CHLORIDE 0.9 % IV BOLUS (SEPSIS)
250.0000 mL | Freq: Once | INTRAVENOUS | Status: AC
  Administered 2016-04-28: 250 mL via INTRAVENOUS

## 2016-04-28 MED ORDER — CARVEDILOL 12.5 MG PO TABS
12.5000 mg | ORAL_TABLET | Freq: Two times a day (BID) | ORAL | Status: DC
Start: 2016-04-28 — End: 2016-05-01
  Administered 2016-04-29 – 2016-05-01 (×5): 12.5 mg via ORAL
  Filled 2016-04-28 (×5): qty 1

## 2016-04-28 MED ORDER — POTASSIUM CHLORIDE CRYS ER 20 MEQ PO TBCR
40.0000 meq | EXTENDED_RELEASE_TABLET | Freq: Every day | ORAL | Status: DC
  Administered 2016-04-28: 40 meq via ORAL
  Filled 2016-04-28: qty 2

## 2016-04-28 NOTE — Progress Notes (Signed)
SUBJECTIVE: Back in afib.  Interestingly, her breathing is better.  At this time, she denies chest pain.    Marland Kitchen apixaban  2.5 mg Oral BID  . carvedilol  6.25 mg Oral BID WC  . estradiol  0.5 mg Oral Daily  . feeding supplement (ENSURE ENLIVE)  237 mL Oral BID BM  . furosemide  40 mg Intravenous BID  . potassium chloride  40 mEq Oral Daily  . sertraline  75 mg Oral Daily  . sodium chloride flush  3 mL Intravenous Q12H      OBJECTIVE: Physical Exam: Filed Vitals:   04/27/16 2325 04/28/16 0449 04/28/16 0539 04/28/16 0721  BP: 97/54 113/61  107/63  Pulse: 66 107 106 102  Temp: 98.1 F (36.7 C) 97.8 F (36.6 C)  97.8 F (36.6 C)  TempSrc: Axillary Oral  Oral  Resp: 22 16 17 20   Height:      Weight:  138 lb (62.596 kg)    SpO2: 97% 94% 95% 95%    Intake/Output Summary (Last 24 hours) at 04/28/16 0914 Last data filed at 04/28/16 0645  Gross per 24 hour  Intake      0 ml  Output   1850 ml  Net  -1850 ml    Telemetry reveals afib has converted to afib with V rates 110s today.  GEN- The patient is thin, appears in NAD, alert and oriented x 3 today.   Head- normocephalic, atraumatic Eyes-  Sclera clear, conjunctiva pink Ears- hearing intact Oropharynx- clear Neck- supple  Lungs- diminished at the bases bilaterally, few basilar rales normal work of breathing Heart- Irregular rate and rhythm, no significant murmurs, no rubs or gallops GI- soft, NT, ND Extremities- no clubbing, cyanosis, or edema Skin- no rash or lesion Psych- euthymic mood, full affect Neuro- no gross deficits appreciated  LABS: Basic Metabolic Panel:  Recent Labs  04/25/16 2233  04/27/16 1448 04/28/16 0008  NA  --   < > 132* 134*  K  --   < > 3.4* 3.1*  CL  --   < > 96* 96*  CO2  --   < > 29 30  GLUCOSE  --   < > 108* 92  BUN  --   < > 23* 19  CREATININE  --   < > 0.86 0.92  CALCIUM  --   < > 8.3* 8.3*  MG 1.9  --   --   --   < > = values in this interval not displayed. CBC:  Recent  Labs  04/25/16 1221 04/27/16 1448  WBC 7.8 10.5  NEUTROABS  --  8.2*  HGB 13.0 13.1  HCT 38.5 40.5  MCV 90.8 91.8  PLT 263 250   Cardiac Enzymes:  Recent Labs  04/27/16 1448 04/27/16 1857 04/28/16 0008  TROPONINI 0.03* 0.03* <0.03   BNP: Invalid input(s): POCBNP D-Dimer:  Recent Labs  04/25/16 1221  DDIMER 0.55*    ASSESSMENT AND PLAN:   1. SOB, CHF  continue diuresis     Renal function stable     IM addressing K+ replacement     Echo reveals reduced EF. Continue medicine optimization.    2. AFib, RVR  Eliquis started this admission now off rhythmol (reduced EF)     Will increase coreg today     Consider addition of amiodarone (was in sinus yesterday but now back in afib.  rhythmol washing out currently).  3. LBBB     Caution with AV  nodal agents and Ics  4. Acute systolic dysfunction     Likely due to AF with RVR.       Coreg titrated today.  Will need addition of ace if creatinine remains stable  Dr Caryl Comes to see tomorrow  Thompson Grayer MD, [facility] 04/28/2016 9:14 AM

## 2016-04-28 NOTE — Progress Notes (Signed)
PROGRESS NOTE  Yvonne [name]  [id number] DOB: Oct 16, 1929 DOA: 04/25/2016 PCP: Mathews Argyle, MD  Brief Narrative:   The patient is an 82 year old female with history of GERD, prior humeral fracture after a fall secondary to a syncopal episode, left bundle branch block, mitral regurgitation, remote atrial fibrillation previously on aspirin. She was evaluated at the end of June for progressive shortness of breath by cardiology and was found to be in atrial fibrillation. She was started on L it was on 6/28. She presented to the hospital with worsening dyspnea. In the emergency department, her heart rate was in the 120s to 140s, blood pressure stable. She was breathing 20-30 times a minute and was placed on oxygen via nasal cannula. Her chest x-ray demonstrated a small left pleural effusion and trace right pleural effusion and no pulmonary edema. CT of the chest demonstrated large bilateral pleural effusions with associated lower lobe consolidation versus edema. Her BNP was 2058. Troponin was negative. She was seen by electrophysiology/cardiology and admitted for further evaluation to the stepdown unit.  Assessment & Plan:   Principal Problem:   Acute on chronic systolic and diastolic heart failure, NYHA class 1 (HCC) Active Problems:   GERD   Atrial fibrillation with RVR (HCC)   Bilateral pleural effusion   Acute respiratory failure with hypoxia (HCC)   Positive D dimer   Protein calorie malnutrition (HCC)   Stage 1 decubitus ulcer/LOW THORACIC spine   Physical deconditioning   Pleural effusion  Acute respiratory failure with hypoxia secondary to acute on chronic systolic and diastolic heart failure, most likely the latter in the setting of a-fib with RVR. Reverted to A. fib with RVR again overnight. -  Marked improvement with BiPAP overnight. -  Given 1 dose of Lasix this morning, however she became hypotensive to the [id number] systolic, asymptomatic -  Gave dose of 250 mL normal  saline with moderate improvement in blood pressure -  Discontinue further Lasix for now -  Diltiazem discontinued -  Discontinue ACE inhibitor secondary to hypotension -  Hold parameter placed on beta blocker for hypertension -  troponins negative -  ST segment elevations appear unchanged from prior -  If she remains hypoxic with the need for intermittent BiPAP with concomitent hypotension, may need heart failure team to evaluate for inotrope therapy.  Atrial fibrillation with RVR, CHADVASc = 4, spontaneously reverted to A. Fib - Discontinued diltiazem and Rythmol -  Case discussed with cardiology -  Continue eliquis -  Carvedilol dose increased by cardiology, hold parameter placed  Hypotension, likely secondary to progressive heart failure and diuresis with multiple antihypertensives -  Holding some hypertension medications -  Hold diuresis for now   Positive D dimer -D-dimer elevated but CTA of chest negative for PE   Stage 1 decubitus ulcer/LOW THORACIC spine -Patient has lost weight since her fall and humeral fracture last year; she is under significant stress assisting in the care of her husband with Alzheimer's -Patient spine protrudes and there is a stage I decubitus over the low thoracic spine -Air mattress   Physical deconditioning - PT evaluation once able to tolerate activity in regards to tachycardia   GERD -Was not on PPI or H2 blocker prior to admission   Protein calorie malnutrition  -Nutrition consultation this admission   Anxiety disorder -Continue preadmission Tranxene and Zoloft  Hypokalemia, start oral potassium supplementation  DVT prophylaxis: Eliquis  Code Status: Full code Family Communication:  Patient alone Disposition Plan:   Holding diuresis.  Attempting rate control for now and may need rhythm control.  Needing intermittent bipap currently.  PT recommended no follow up on 6/30, but will need reassessment given worsening condition.     Consultants:   EP, Dr. Lovena Le, Dr. Rayann Heman   Procedures:  None  Antimicrobials:   Ceftriaxone and azithromycin 1    Subjective:  Feels much better this morning. SOB improving somewhat.    Objective: Filed Vitals:   04/28/16 1131 04/28/16 1300 04/28/16 1445 04/28/16 1451  BP: 76/53 93/54 78/61  94/54  Pulse: 105  124 108  Temp: 97.4 F (36.3 C)     TempSrc: Oral     Resp: 27  25 26   Height:      Weight:      SpO2: 95%  91% 94%    Intake/Output Summary (Last 24 hours) at 04/28/16 1535 Last data filed at 04/28/16 1134  Gross per 24 hour  Intake      0 ml  Output   1725 ml  Net  -1725 ml   Filed Weights   04/26/16 0500 04/27/16 0343 04/28/16 0449  Weight: 60.464 kg (133 lb 4.8 oz) 60.238 kg (132 lb 12.8 oz) 62.596 kg (138 lb)    Examination:  General exam:  Adult Female.  Mild respiratory distress  HEENT:  NCAT, MMM Respiratory system:  Rales at the bilateral bases Cardiovascular system:  IRRR, 2/6 systolic murmur.  Warm extremities Gastrointestinal system: Normal active bowel sounds, soft, nondistended, nontender. MSK:  Decreased tone and bulk Neuro:  Grossly intact, able to sit up in bed with minimal assistance    Data Reviewed: I have personally reviewed following labs and imaging studies  CBC:  Recent Labs Lab 04/22/16 1621 04/25/16 1221 04/27/16 1448  WBC 7.9 7.8 10.5  NEUTROABS  --   --  8.2*  HGB 13.3 13.0 13.1  HCT 39.8 38.5 40.5  MCV 91.1 90.8 91.8  PLT 337 263 [id number]   Basic Metabolic Panel:  Recent Labs Lab 04/22/16 1621 04/25/16 1221 04/25/16 2233 04/26/16 0529 04/27/16 0350 04/27/16 1448 04/28/16 0008  NA 137 135  --  137 134* 132* 134*  K 4.2 4.4  --  3.6 3.2* 3.4* 3.1*  CL 102 103  --  102 99* 96* 96*  CO2 24 23  --  28 26 29 30   GLUCOSE 93 119*  --  94 131* 108* 92  BUN 25 25*  --  18 22* 23* 19  CREATININE 0.74 0.67  --  0.73 0.80 0.86 0.92  CALCIUM 8.9 9.0  --  8.4* 8.6* 8.3* 8.3*  MG 2.2  --  1.9  --   --   --    --    GFR: Estimated Creatinine Clearance: 41.9 mL/min (by C-G formula based on Cr of 0.92). Liver Function Tests:  Recent Labs Lab 04/22/16 1621 04/27/16 1448  AST 26 26  ALT 31* 56*  ALKPHOS 77 81  BILITOT 0.4 0.7  PROT 6.7 6.0*  ALBUMIN 3.9 3.2*   No results for input(s): LIPASE, AMYLASE in the last 168 hours. No results for input(s): AMMONIA in the last 168 hours. Coagulation Profile: No results for input(s): INR, PROTIME in the last 168 hours. Cardiac Enzymes:  Recent Labs Lab 04/25/16 2233 04/26/16 0529 04/27/16 1448 04/27/16 1857 04/28/16 0008  TROPONINI 0.06* 0.04* 0.03* 0.03* <0.03   BNP (last 3 results) No results for input(s): PROBNP in the last 8760 hours. HbA1C: No results for input(s): HGBA1C in the last 72 hours.  CBG: No results for input(s): GLUCAP in the last 168 hours. Lipid Profile: No results for input(s): CHOL, HDL, LDLCALC, TRIG, CHOLHDL, LDLDIRECT in the last 72 hours. Thyroid Function Tests: No results for input(s): TSH, T4TOTAL, FREET4, T3FREE, THYROIDAB in the last 72 hours. Anemia Panel: No results for input(s): VITAMINB12, FOLATE, FERRITIN, TIBC, IRON, RETICCTPCT in the last 72 hours. Urine analysis:    Component Value Date/Time   COLORURINE YELLOW 04/23/2015 2023   APPEARANCEUR CLOUDY* 04/23/2015 2023   LABSPEC 1.014 04/23/2015 2023   PHURINE 7.0 04/23/2015 2023   GLUCOSEU NEGATIVE 04/23/2015 2023   GLUCOSEU NEG mg/dL 10/09/2010 0343   HGBUR NEGATIVE 04/23/2015 2023   BILIRUBINUR NEGATIVE 04/23/2015 2023   KETONESUR 15* 04/23/2015 2023   PROTEINUR NEGATIVE 04/23/2015 2023   UROBILINOGEN 0.2 04/23/2015 2023   NITRITE POSITIVE* 04/23/2015 2023   LEUKOCYTESUR TRACE* 04/23/2015 2023   Sepsis Labs: @LABRCNTIP (procalcitonin:4,lacticidven:4)  ) Recent Results (from the past 240 hour(s))  MRSA PCR Screening     Status: None   Collection Time: 04/25/16 10:16 PM  Result Value Ref Range Status   MRSA by PCR NEGATIVE NEGATIVE Final     Comment:        The GeneXpert MRSA Assay (FDA approved for NASAL specimens only), is one component of a comprehensive MRSA colonization surveillance program. It is not intended to diagnose MRSA infection nor to guide or monitor treatment for MRSA infections.   Culture, blood (routine x 2) Call MD if unable to obtain prior to antibiotics being given     Status: None (Preliminary result)   Collection Time: 04/25/16 10:44 PM  Result Value Ref Range Status   Specimen Description BLOOD LEFT ARM  Final   Special Requests IN PEDIATRIC BOTTLE 3ML  Final   Culture NO GROWTH 2 DAYS  Final   Report Status PENDING  Incomplete  Culture, blood (routine x 2) Call MD if unable to obtain prior to antibiotics being given     Status: None (Preliminary result)   Collection Time: 04/25/16 10:50 PM  Result Value Ref Range Status   Specimen Description BLOOD RIGHT ARM  Final   Special Requests BOTTLES DRAWN AEROBIC AND ANAEROBIC 5ML  Final   Culture NO GROWTH 2 DAYS  Final   Report Status PENDING  Incomplete      Radiology Studies: Dg Chest Port 1 View  04/27/2016  CLINICAL DATA:  Dyspnea EXAM: PORTABLE CHEST 1 VIEW COMPARISON:  Chest radiograph from earlier today. FINDINGS: Surgical hardware is seen in the proximal left humerus. Stable cardiomediastinal silhouette with mild cardiomegaly. No pneumothorax. Stable small bilateral pleural effusions. Mild to moderate pulmonary edema appears slightly worsened. Patchy bibasilar lung opacities appear stable. IMPRESSION: 1. Mild-to-moderate congestive heart failure, slightly worsened. 2. Stable bilateral pleural effusions and bibasilar lung opacities favoring atelectasis. Electronically Signed   By: Ilona Sorrel M.D.   On: 04/27/2016 13:17   Dg Chest Port 1 View  04/27/2016  CLINICAL DATA:  Followup pleural effusions, dyspnea EXAM: PORTABLE CHEST 1 VIEW COMPARISON:  04/26/16 FINDINGS: Cardiac shadow is enlarged. Bilateral pleural effusions are again seen. There  is likely some underlying atelectasis as well. Increase in the degree of vascular congestion is noted consistent with congestive failure. No bony abnormality is noted. IMPRESSION: Stable bibasilar effusion and atelectasis. Increased vascular congestion consistent with CHF. Electronically Signed   By: Inez Catalina M.D.   On: 04/27/2016 07:31     Scheduled Meds: . apixaban  2.5 mg Oral BID  . carvedilol  12.5  mg Oral BID WC  . estradiol  0.5 mg Oral Daily  . feeding supplement (ENSURE ENLIVE)  237 mL Oral BID BM  . potassium chloride  40 mEq Oral Daily  . sertraline  75 mg Oral Daily  . sodium chloride flush  3 mL Intravenous Q12H   Continuous Infusions:     LOS: 3 days    Time spent: 30 min    Janece Canterbury, MD [facility] Pager [phone]  If 7PM-7AM, please contact night-coverage [email or web address] Password [email or web address] 04/28/2016, 3:35 PM

## 2016-04-28 NOTE — Progress Notes (Signed)
Pt is now going back into afib with a rate in the 100, called doctor and left message to call back

## 2016-04-28 NOTE — Progress Notes (Signed)
Removed BiPAP from patient and placed on 5L nasal cannula to give patient a break, she was uncomfortable and waking up every few minutes. RT will continue to monitor.

## 2016-04-29 ENCOUNTER — Encounter (HOSPITAL_COMMUNITY): Payer: Self-pay | Admitting: Certified Registered Nurse Anesthetist

## 2016-04-29 ENCOUNTER — Encounter (HOSPITAL_COMMUNITY)
Admission: EM | Disposition: A | Discharge status: Home / Self Care | Payer: Self-pay | Source: Home / Self Care | Attending: Internal Medicine

## 2016-04-29 ENCOUNTER — Inpatient Hospital Stay (HOSPITAL_COMMUNITY): Payer: Medicare Other

## 2016-04-29 ENCOUNTER — Ambulatory Visit (HOSPITAL_COMMUNITY): Admission: RE | Admit: 2016-04-29 | Payer: Medicare Other | Source: Ambulatory Visit | Admitting: Cardiovascular Disease

## 2016-04-29 DIAGNOSIS — E876 Hypokalemia: Secondary | ICD-10-CM

## 2016-04-29 DIAGNOSIS — I447 Left bundle-branch block, unspecified: Secondary | ICD-10-CM

## 2016-04-29 DIAGNOSIS — I5021 Acute systolic (congestive) heart failure: Secondary | ICD-10-CM

## 2016-04-29 DIAGNOSIS — I428 Other cardiomyopathies: Secondary | ICD-10-CM

## 2016-04-29 LAB — BASIC METABOLIC PANEL
ANION GAP: 8 (ref 5–15)
BUN: 21 mg/dL — ABNORMAL HIGH (ref 6–20)
CALCIUM: 8.6 mg/dL — AB (ref 8.9–10.3)
CO2: 27 mmol/L (ref 22–32)
Chloride: 98 mmol/L — ABNORMAL LOW (ref 101–111)
Creatinine, Ser: 0.73 mg/dL (ref 0.44–1.00)
GLUCOSE: 124 mg/dL — AB (ref 65–99)
Potassium: 3.4 mmol/L — ABNORMAL LOW (ref 3.5–5.1)
SODIUM: 133 mmol/L — AB (ref 135–145)

## 2016-04-29 LAB — BRAIN NATRIURETIC PEPTIDE: B Natriuretic Peptide: 462.2 pg/mL — ABNORMAL HIGH (ref 0.0–100.0)

## 2016-04-29 SURGERY — ECHOCARDIOGRAM, TRANSESOPHAGEAL
Anesthesia: Monitor Anesthesia Care

## 2016-04-29 MED ORDER — SODIUM CHLORIDE 0.9 % IV SOLN: INTRAVENOUS | Status: DC

## 2016-04-29 MED ORDER — OFF THE BEAT BOOK
Freq: Once | Status: AC
  Administered 2016-04-29: 16:00:00
  Filled 2016-04-29: qty 1

## 2016-04-29 MED ORDER — FUROSEMIDE 10 MG/ML IJ SOLN
40.0000 mg | Freq: Every day | INTRAMUSCULAR | Status: DC
  Administered 2016-04-29 – 2016-04-30 (×2): 40 mg via INTRAVENOUS
  Filled 2016-04-29 (×2): qty 4

## 2016-04-29 MED ORDER — AMIODARONE HCL 200 MG PO TABS
400.0000 mg | ORAL_TABLET | Freq: Two times a day (BID) | ORAL | Status: DC
  Administered 2016-04-29 – 2016-04-30 (×2): 400 mg via ORAL
  Filled 2016-04-29 (×3): qty 2

## 2016-04-29 MED ORDER — POTASSIUM CHLORIDE CRYS ER 20 MEQ PO TBCR
40.0000 meq | EXTENDED_RELEASE_TABLET | Freq: Three times a day (TID) | ORAL | Status: DC
Start: 2016-04-29 — End: 2016-04-30
  Administered 2016-04-29 (×3): 40 meq via ORAL
  Filled 2016-04-29 (×3): qty 2

## 2016-04-29 NOTE — Care Management Important Message (Signed)
Important Message  Patient Details  Name: Yvonne [name] MRN: [id number] Date of Birth: 12-Sep-1929   Medicare Important Message Given:  Yes    Khrystyne Arpin Abena 04/29/2016, 10:57 AM

## 2016-04-29 NOTE — Progress Notes (Signed)
Patient Name: Yvonne [name]      SUBJECTIVE: In and out of sinus over the [facility] on Saturday when in sinus ??? Propafenone stopped Echo EF 25-30%  ? Rate related with severe Biatrial  Enlargement  Both new from 2010      Past Medical History  Diagnosis Date  . Hemorrhage of gastrointestinal tract, unspecified     a. occurred on Aleve  . Anxiety state, unspecified   . Left ventricular systolic dysfunction     hx of mild  . History of peptic ulcer   . Esophageal reflux   . Mitral valve insufficiency and aortic valve insufficiency     a. Remote echo 2010: mild focal basal hypertrophy of septum, EF 50-55%, mid-distal inferoseptal myocardium, grade 1 DD, LBBB, mild AI/MR, PASP mildly increased.  . Wears glasses   . Paroxysmal supraventricular tachycardia ([place])   . LBBB (left bundle branch block)   . Cataract   . Cancer (HCC)     basil cell carcinomas removed  . Arthritis   . Atrial fibrillation ([place])     a. Per Dr. Olin Pia note, one prior episode of afib.    Scheduled Meds:  Scheduled Meds: . apixaban  2.5 mg Oral BID  . carvedilol  12.5 mg Oral BID WC  . estradiol  0.5 mg Oral Daily  . feeding supplement (ENSURE ENLIVE)  237 mL Oral BID BM  . potassium chloride  40 mEq Oral Daily  . sertraline  75 mg Oral Daily  . sodium chloride flush  3 mL Intravenous Q12H   Continuous Infusions:  sodium chloride, acetaminophen, clorazepate, metoprolol, ondansetron (ZOFRAN) IV, ondansetron (ZOFRAN) IV, sodium chloride flush    PHYSICAL EXAM Filed Vitals:   04/28/16 2310 04/29/16 0250 04/29/16 0302 04/29/16 0732  BP: 102/60 97/60  103/60  Pulse: 115 108  105  Temp:  97.8 F (36.6 C)  97.5 F (36.4 C)  TempSrc:  Oral  Oral  Resp: 23 19  22   Height:      Weight:   137 lb 5.6 oz (62.3 kg)   SpO2: 96% 95%  96%    General appearance: alert, cooperative and no distress Neck: JVD - 6 cm above sternal notch, no adenopathy, no carotid bruit, no JVD,  supple, symmetrical, trachea midline and thyroid not enlarged, symmetric, no tenderness/mass/nodules Lungs: clear to auscultation bilaterally Heart: irregularly irregular rhythm Extremities: edema none Skin: Skin color, texture, turgor normal. No rashes or lesions Neurologic: Alert and oriented X 3, normal strength and tone. Normal symmetric reflexes. Normal coordination and gait  TELEMETRY: Reviewed telemetry pt in afib with HRabout 100:    Intake/Output Summary (Last 24 hours) at 04/29/16 0803 Last data filed at 04/28/16 2000  Gross per 24 hour  Intake    120 ml  Output    950 ml  Net   -830 ml    LABS: Basic Metabolic Panel:  Recent Labs Lab 04/22/16 1621 04/25/16 1221 04/25/16 2233 04/26/16 0529 04/27/16 0350 04/27/16 1448 04/28/16 0008  NA 137 135  --  137 134* 132* 134*  K 4.2 4.4  --  3.6 3.2* 3.4* 3.1*  CL 102 103  --  102 99* 96* 96*  CO2 24 23  --  28 26 29 30   GLUCOSE 93 119*  --  94 131* 108* 92  BUN 25 25*  --  18 22* 23* 19  CREATININE 0.74 0.67  --  0.73 0.80 0.86 0.92  CALCIUM 8.9 9.0  --  8.4* 8.6* 8.3* 8.3*  MG 2.2  --  1.9  --   --   --   --    Cardiac Enzymes:  Recent Labs  04/27/16 1448 04/27/16 1857 04/28/16 0008  TROPONINI 0.03* 0.03* <0.03   CBC:  Recent Labs Lab 04/22/16 1621 04/25/16 1221 04/27/16 1448  WBC 7.9 7.8 10.5  NEUTROABS  --   --  8.2*  HGB 13.3 13.0 13.1  HCT 39.8 38.5 40.5  MCV 91.1 90.8 91.8  PLT 337 263 250   PROTIME: No results for input(s): LABPROT, INR in the last 72 hours. Liver Function Tests:  Recent Labs  04/27/16 1448  AST 26  ALT 56*  ALKPHOS 81  BILITOT 0.7  PROT 6.0*  ALBUMIN 3.2*   No results for input(s): LIPASE, AMYLASE in the last 72 hours. BNP: BNP (last 3 results)  Recent Labs  04/22/16 1621 04/25/16 1221 04/26/16 0530  BNP 950.8* 2057.2* 1137.8*    ProBNP (last 3 results) No results for input(s): PROBNP in the last 8760 hours.  D-Dimer: No results for input(s):  DDIMER in the last 72 hours. Hemoglobin A1C: No results for input(s): HGBA1C in the last 72 hours. Fasting Lipid Panel:  CXR 7/1 effusion and ongoing CHF   ASSESSMENT AND PLAN:  Principal Problem:   Acute systolic congestive heart failure, NYHA class 3 (HCC) Active Problems:   GERD   Atrial fibrillation with RVR (HCC)   Bilateral pleural effusion   Acute respiratory failure with hypoxia (HCC)   Positive D dimer   Protein calorie malnutrition (HCC)   Stage 1 decubitus ulcer/LOW THORACIC spine   Physical deconditioning   Hypokalemia   NICM (nonischemic cardiomyopathy) (HCC)   LBBB (left bundle branch block)  Will add amiodarone for rate control Replete K Continue apixoban  Diuretics continue despite lower blood pressure Will repeat BNP this am   Signed, Virl Axe MD  04/29/2016

## 2016-04-29 NOTE — Progress Notes (Addendum)
PROGRESS NOTE  Yvonne [name]  [id number] DOB: Jan 31, 1929 DOA: 04/25/2016 PCP: Mathews Argyle, MD  Brief Narrative:   The patient is an 82 year old female with history of GERD, prior humeral fracture after a fall secondary to a syncopal episode, left bundle branch block, mitral regurgitation, remote atrial fibrillation previously on aspirin. She was evaluated at the end of June for progressive shortness of breath by cardiology and was found to be in atrial fibrillation. She was started on L it was on 6/28. She presented to the hospital with worsening dyspnea. In the emergency department, her heart rate was in the 120s to 140s, blood pressure stable. She was breathing 20-30 times a minute and was placed on oxygen via nasal cannula. Her chest x-ray demonstrated a small left pleural effusion and trace right pleural effusion and no pulmonary edema. CT of the chest demonstrated large bilateral pleural effusions with associated lower lobe consolidation versus edema. Her BNP was 2058. Troponin was negative. She was seen by electrophysiology/cardiology and admitted for further evaluation to the stepdown unit.  Assessment & Plan:   Principal Problem:   Acute systolic congestive heart failure, NYHA class 3 (HCC) Active Problems:   GERD   Atrial fibrillation with RVR (HCC)   Bilateral pleural effusion   Acute respiratory failure with hypoxia (HCC)   Positive D dimer   Protein calorie malnutrition (HCC)   Stage 1 decubitus ulcer/LOW THORACIC spine   Physical deconditioning   Hypokalemia   NICM (nonischemic cardiomyopathy) (HCC)   LBBB (left bundle branch block)  Acute respiratory failure with hypoxia secondary to acute on chronic systolic and diastolic heart failure -  Has not required her when necessary BiPAP and over 24 hours -  Blood pressure improving -  On beta blocker -  Not on ACEI due to hypotension -  Resume Lasix 40 mg IV daily -  Hold parameter placed on beta blocker for  hypotension -  troponins negative -  ST segment elevations appear unchanged from prior -  ean oxygen as tolerated -  Rales are dramatically improved today  Atrial fibrillation with RVR, CHADVASc = 4, spontaneously reverted to A. Fib -  Appreciate cardiology assistance - Discontinued diltiazem and Rythmol -  Continue eliquis -  Continue carvedilol -  Amiodarone 400 mg twice a day started on 7/3  Hypotension, likely secondary to progressive heart failure and diuresis with multiple antihypertensives, improving -  Resumed diuresis   Positive D dimer, CTA of chest negative for PE   Stage 1 decubitus ulcer/LOW THORACIC spine -Patient has lost weight since her fall and humeral fracture last year; she is under significant stress assisting in the care of her husband with Alzheimer's -Patient spine protrudes and there is a stage I decubitus over the low thoracic spine -Air mattress   Physical deconditioning, physical therapy regimen no follow-up   Anxiety disorder, stable, continue Tranxene and Zoloft  Hypokalemia, improving, continue oral potassium supplementation  DVT prophylaxis: Eliquis  Code Status: Full code Family Communication:  Patient and her daughter.   Disposition Plan:   Resuming diuresis and may become hypotensive again.  Continue stepdown today but if stable blood pressure and not requiring bipap, plan to transfer to telemetry tomorrow morning.  Consultants:   EP, Dr. Lovena Le, Dr. Rayann Heman was unifocal and absolutely  Procedures:  None  Antimicrobials:   Ceftriaxone and azithromycin 1    Subjective:  Patient states that her dyspnea is improving. She denies lightheadedness or dizziness despite her low blood pressures yesterday.  enies chest pains.  Objective: Filed Vitals:   04/28/16 2310 04/29/16 0250 04/29/16 0302 04/29/16 0732  BP: 102/60 97/60  103/60  Pulse: 115 108  105  Temp:  97.8 F (36.6 C)  97.5 F (36.4 C)  TempSrc:  Oral  Oral  Resp: 23  19  22   Height:      Weight:   62.3 kg (137 lb 5.6 oz)   SpO2: 96% 95%  96%    Intake/Output Summary (Last 24 hours) at 04/29/16 1034 Last data filed at 04/28/16 2000  Gross per 24 hour  Intake    120 ml  Output    950 ml  Net   -830 ml   Filed Weights   04/27/16 0343 04/28/16 0449 04/29/16 0302  Weight: 60.238 kg (132 lb 12.8 oz) 62.596 kg (138 lb) 62.3 kg (137 lb 5.6 oz)    Examination:  General exam:  Adult Female. No acute distress HEENT:  NCAT, MMM Respiratory system:  Minimal rales at the bilateral bases, no wheezes or rhonchi Cardiovascular system:  IRRR and tachycardic, 2/6 systolic murmur.  Telemetry: Persistent atrial fibrillation over last 24 hours with heart rate 110 to 120s  Warm extremities Gastrointestinal system: Normal active bowel sounds, soft, nondistended, nontender. MSK:  Decreased tone and bulk Neuro:  Grossly intact, able to sit up in bed with minimal assistance    Data Reviewed: I have personally reviewed following labs and imaging studies  CBC:  Recent Labs Lab 04/22/16 1621 04/25/16 1221 04/27/16 1448  WBC 7.9 7.8 10.5  NEUTROABS  --   --  8.2*  HGB 13.3 13.0 13.1  HCT 39.8 38.5 40.5  MCV 91.1 90.8 91.8  PLT 337 263 [id number]   Basic Metabolic Panel:  Recent Labs Lab 04/22/16 1621  04/25/16 2233 04/26/16 0529 04/27/16 0350 04/27/16 1448 04/28/16 0008 04/29/16 0705  NA 137  < >  --  137 134* 132* 134* 133*  K 4.2  < >  --  3.6 3.2* 3.4* 3.1* 3.4*  CL 102  < >  --  102 99* 96* 96* 98*  CO2 24  < >  --  28 26 29 30 27   GLUCOSE 93  < >  --  94 131* 108* 92 124*  BUN 25  < >  --  18 22* 23* 19 21*  CREATININE 0.74  < >  --  0.73 0.80 0.86 0.92 0.73  CALCIUM 8.9  < >  --  8.4* 8.6* 8.3* 8.3* 8.6*  MG 2.2  --  1.9  --   --   --   --   --   < > = values in this interval not displayed. GFR: Estimated Creatinine Clearance: 48.1 mL/min (by C-G formula based on Cr of 0.73). Liver Function Tests:  Recent Labs Lab 04/22/16 1621  04/27/16 1448  AST 26 26  ALT 31* 56*  ALKPHOS 77 81  BILITOT 0.4 0.7  PROT 6.7 6.0*  ALBUMIN 3.9 3.2*   No results for input(s): LIPASE, AMYLASE in the last 168 hours. No results for input(s): AMMONIA in the last 168 hours. Coagulation Profile: No results for input(s): INR, PROTIME in the last 168 hours. Cardiac Enzymes:  Recent Labs Lab 04/25/16 2233 04/26/16 0529 04/27/16 1448 04/27/16 1857 04/28/16 0008  TROPONINI 0.06* 0.04* 0.03* 0.03* <0.03   BNP (last 3 results) No results for input(s): PROBNP in the last 8760 hours. HbA1C: No results for input(s): HGBA1C in the last 72 hours. CBG: No  results for input(s): GLUCAP in the last 168 hours. Lipid Profile: No results for input(s): CHOL, HDL, LDLCALC, TRIG, CHOLHDL, LDLDIRECT in the last 72 hours. Thyroid Function Tests: No results for input(s): TSH, T4TOTAL, FREET4, T3FREE, THYROIDAB in the last 72 hours. Anemia Panel: No results for input(s): VITAMINB12, FOLATE, FERRITIN, TIBC, IRON, RETICCTPCT in the last 72 hours. Urine analysis:    Component Value Date/Time   COLORURINE YELLOW 04/23/2015 2023   APPEARANCEUR CLOUDY* 04/23/2015 2023   LABSPEC 1.014 04/23/2015 2023   PHURINE 7.0 04/23/2015 2023   GLUCOSEU NEGATIVE 04/23/2015 2023   GLUCOSEU NEG mg/dL 10/09/2010 0343   HGBUR NEGATIVE 04/23/2015 2023   BILIRUBINUR NEGATIVE 04/23/2015 2023   KETONESUR 15* 04/23/2015 2023   PROTEINUR NEGATIVE 04/23/2015 2023   UROBILINOGEN 0.2 04/23/2015 2023   NITRITE POSITIVE* 04/23/2015 2023   LEUKOCYTESUR TRACE* 04/23/2015 2023   Sepsis Labs: @LABRCNTIP (procalcitonin:4,lacticidven:4)  ) Recent Results (from the past 240 hour(s))  MRSA PCR Screening     Status: None   Collection Time: 04/25/16 10:16 PM  Result Value Ref Range Status   MRSA by PCR NEGATIVE NEGATIVE Final    Comment:        The GeneXpert MRSA Assay (FDA approved for NASAL specimens only), is one component of a comprehensive MRSA  colonization surveillance program. It is not intended to diagnose MRSA infection nor to guide or monitor treatment for MRSA infections.   Culture, blood (routine x 2) Call MD if unable to obtain prior to antibiotics being given     Status: None (Preliminary result)   Collection Time: 04/25/16 10:44 PM  Result Value Ref Range Status   Specimen Description BLOOD LEFT ARM  Final   Special Requests IN PEDIATRIC BOTTLE 3ML  Final   Culture NO GROWTH 3 DAYS  Final   Report Status PENDING  Incomplete  Culture, blood (routine x 2) Call MD if unable to obtain prior to antibiotics being given     Status: None (Preliminary result)   Collection Time: 04/25/16 10:50 PM  Result Value Ref Range Status   Specimen Description BLOOD RIGHT ARM  Final   Special Requests BOTTLES DRAWN AEROBIC AND ANAEROBIC 5ML  Final   Culture NO GROWTH 3 DAYS  Final   Report Status PENDING  Incomplete      Radiology Studies: Dg Chest Port 1 View  04/27/2016  CLINICAL DATA:  Dyspnea EXAM: PORTABLE CHEST 1 VIEW COMPARISON:  Chest radiograph from earlier today. FINDINGS: Surgical hardware is seen in the proximal left humerus. Stable cardiomediastinal silhouette with mild cardiomegaly. No pneumothorax. Stable small bilateral pleural effusions. Mild to moderate pulmonary edema appears slightly worsened. Patchy bibasilar lung opacities appear stable. IMPRESSION: 1. Mild-to-moderate congestive heart failure, slightly worsened. 2. Stable bilateral pleural effusions and bibasilar lung opacities favoring atelectasis. Electronically Signed   By: Ilona Sorrel M.D.   On: 04/27/2016 13:17     Scheduled Meds: . amiodarone  400 mg Oral BID  . apixaban  2.5 mg Oral BID  . carvedilol  12.5 mg Oral BID WC  . estradiol  0.5 mg Oral Daily  . feeding supplement (ENSURE ENLIVE)  237 mL Oral BID BM  . furosemide  40 mg Intravenous Daily  . potassium chloride  40 mEq Oral TID  . sertraline  75 mg Oral Daily  . sodium chloride flush  3 mL  Intravenous Q12H   Continuous Infusions: . sodium chloride       LOS: 4 days    Time spent: 30 min  Janece Canterbury, MD [facility] Pager [phone]  If 7PM-7AM, please contact night-coverage [email or web address] Password [email or web address] 04/29/2016, 10:34 AM

## 2016-04-29 NOTE — Progress Notes (Signed)
bp post lopressor will continue to monitor

## 2016-04-29 NOTE — Progress Notes (Signed)
PT Cancellation Note  Patient Details Name: Yvonne [name] MRN: [id number] DOB: 10-Dec-1928   Cancelled Treatment:    Reason Eval/Treat Not Completed: Other (comment).  PT assisted pt back to bed from bathroom, however, she did not feel up to walking further due to breathing and HR (HR while moving from bathroom to bed was 120-130- RN aware and has given HR control meds).  Pt was agreeable for PT to check back tomorrow.  Thanks,     04/29/2016, 2:30 PM

## 2016-04-30 ENCOUNTER — Inpatient Hospital Stay (HOSPITAL_COMMUNITY): Payer: Medicare Other

## 2016-04-30 DIAGNOSIS — R110 Nausea: Secondary | ICD-10-CM

## 2016-04-30 DIAGNOSIS — I429 Cardiomyopathy, unspecified: Secondary | ICD-10-CM

## 2016-04-30 DIAGNOSIS — I447 Left bundle-branch block, unspecified: Secondary | ICD-10-CM

## 2016-04-30 LAB — CULTURE, BLOOD (ROUTINE X 2)
CULTURE: NO GROWTH
Culture: NO GROWTH

## 2016-04-30 LAB — CBC
HCT: 37.9 % (ref 36.0–46.0)
Hemoglobin: 12.3 g/dL (ref 12.0–15.0)
MCH: 29.6 pg (ref 26.0–34.0)
MCHC: 32.5 g/dL (ref 30.0–36.0)
MCV: 91.3 fL (ref 78.0–100.0)
PLATELETS: 247 10*3/uL (ref 150–400)
RBC: 4.15 MIL/uL (ref 3.87–5.11)
RDW: 14.3 % (ref 11.5–15.5)
WBC: 6.6 10*3/uL (ref 4.0–10.5)

## 2016-04-30 LAB — BASIC METABOLIC PANEL
Anion gap: 5 (ref 5–15)
BUN: 21 mg/dL — AB (ref 6–20)
CALCIUM: 8.8 mg/dL — AB (ref 8.9–10.3)
CO2: 27 mmol/L (ref 22–32)
CREATININE: 0.69 mg/dL (ref 0.44–1.00)
Chloride: 101 mmol/L (ref 101–111)
GFR calc Af Amer: >60 mL/min (ref >60)
GLUCOSE: 99 mg/dL (ref 65–99)
Potassium: 4.5 mmol/L (ref 3.5–5.1)
Sodium: 133 mmol/L — ABNORMAL LOW (ref 135–145)

## 2016-04-30 MED ORDER — FUROSEMIDE 40 MG PO TABS
40.0000 mg | ORAL_TABLET | Freq: Every day | ORAL | Status: DC
  Administered 2016-05-01: 40 mg via ORAL
  Filled 2016-04-30: qty 1

## 2016-04-30 MED ORDER — POTASSIUM CHLORIDE CRYS ER 20 MEQ PO TBCR
40.0000 meq | EXTENDED_RELEASE_TABLET | Freq: Every day | ORAL | Status: DC
  Administered 2016-04-30 – 2016-05-01 (×2): 40 meq via ORAL
  Filled 2016-04-30 (×2): qty 2

## 2016-04-30 MED ORDER — AMIODARONE HCL 200 MG PO TABS
200.0000 mg | ORAL_TABLET | Freq: Three times a day (TID) | ORAL | Status: DC
  Administered 2016-04-30 – 2016-05-01 (×3): 200 mg via ORAL
  Filled 2016-04-30 (×3): qty 1

## 2016-04-30 NOTE — Progress Notes (Signed)
Physical Therapy Treatment Patient Details Name: Yvonne [name] MRN: [id number] DOB: [birth year] Today's Date: 04/30/2016    History of Present Illness 82 y.o. female admitted to [facility] on 04/25/16 for worsening dyspnea.  Dx with bil pleural effusions, acute respiratory failure with hypoxia due to acute on chronic systolic and diastolic heart failure, A-fib with RVR, (+) D dimer (negative PE), and stage 1 decubitus on her lower thoracic spine.  Pt with significant PMHx of anxiety, left ventricular systolic dysfunction, mitral valve insufficiency and aortic valve insufficiency, paroxysmal supraventricular tachycardia, LBBB, A-fib, L knee arthroscopy, ORIF L wrist, L foot surgery, ORIF L humerus fx.       PT Comments    Pt was feeling tired today stating she did not sleep well last night. Pt was encouraged to use a RW at home for safety with walking balance. She is progressing towards goals. Will progress to supervision during activities next session. Pt would benefit from continued PT in order to increase her functional independence.   Follow Up Recommendations  No PT follow up     Equipment Recommendations  Rolling walker with 5" wheels       Precautions / Restrictions Precautions Precautions: Fall Restrictions Weight Bearing Restrictions: No    Mobility  Bed Mobility Overal bed mobility: Modified Independent             General bed mobility comments: guide with hands to sit in bed  Transfers Overall transfer level: Modified independent Equipment used: Rolling walker (2 wheeled) Transfers: Sit to/from Stand Sit to Stand: Min guard         General transfer comment: guard for safety since pt stated she was feeling tired, Verbal cues to push from chair  Ambulation/Gait Ambulation/Gait assistance: Min guard Ambulation Distance (Feet): 300 Feet Assistive device: Rolling walker (2 wheeled) Gait Pattern/deviations: Step-through pattern;Staggering left;Staggering right      General Gait Details: mild staggering gait pattern with min guard assist for safety and chair to follow.  Pt needed verbal cues to keep in a straight walking pattern and to keep from running into objects. Pt walks in the direction she is looking.                       Balance Overall balance assessment: Needs assistance Sitting-balance support: Feet supported;No upper extremity supported Sitting balance-Leahy Scale: Good     Standing balance support: Bilateral upper extremity supported Standing balance-Leahy Scale: Good                      Cognition Arousal/Alertness: Awake/alert Behavior During Therapy: WFL for tasks assessed/performed Overall Cognitive Status: Within Functional Limits for tasks assessed                         General Comments General comments (skin integrity, edema, etc.): HR remained in the upper 80s during ambulationan. O2 sats increased to 100% during ambulation.       Pertinent Vitals/Pain Pain Assessment: 0-10 Pain Score: 5  Pain Location: Low back Pain Descriptors / Indicators: Aching Pain Intervention(s): Monitored during session;Repositioned           PT Goals (current goals can now be found in the care plan section) Progress towards PT goals: Progressing toward goals    Frequency  Min 3X/week     End of Session Equipment Utilized During Treatment: Gait belt Activity Tolerance: Patient tolerated treatment well Patient left: in bed;with call bell/phone within  reach;with family/visitor present     Time: [id number] PT Time Calculation (min) (ACUTE ONLY): 24 min  Charges:                       G CodesNash Mantis, Dayle Points [id number]  04/30/2016, 11:32 AM

## 2016-04-30 NOTE — Progress Notes (Signed)
PROGRESS NOTE  Yvonne [name]  [id number] DOB: [birth year] DOA: 04/25/2016 PCP: Mathews Argyle, MD  Brief Narrative:   The patient is an 82 year old female with history of GERD, prior humeral fracture after a fall secondary to a syncopal episode, left bundle branch block, mitral regurgitation, remote atrial fibrillation previously on aspirin. She was evaluated at the end of June for progressive shortness of breath by cardiology and was found to be in atrial fibrillation. She was started on Eliquis on 6/28. She presented to the hospital with worsening dyspnea. In the emergency department, her heart rate was in the 120s to 140s, blood pressure stable. She was breathing 20-30 times a minute and was placed on oxygen via nasal cannula. Her chest x-ray demonstrated a small left pleural effusion and trace right pleural effusion and no pulmonary edema. CT of the chest demonstrated large bilateral pleural effusions with associated lower lobe consolidation versus edema. Her BNP was 2058.  ECHO demonstrated biventricular failure.  Troponin was negative. She was seen by electrophysiology/cardiology.  She was initially continued on rythmol and a low dose diltiazem gtt was added.  She spontaneously cardioverted and a few hours after going into the sinus rhythm on 7/1, she developed acute dyspnea requiring urgent bipap.  She used bipap on and off for about 24 hours and continued to diurese.  She has been intermittently hypotensive to the [id number] systolic but has remained asymptomatic.  Her rythmol was discontinued and she has been started on amiodarone and beta blocker for rate and rhythm control.  She has been in and out of SR for the last few days, but seems to be more persistently in SR since starting amio.  She has diuresed well and anticipate discharge soon.    Assessment & Plan:   Principal Problem:   Acute systolic congestive heart failure, NYHA class 3 (HCC) Active Problems:   GERD   Atrial  fibrillation with RVR (HCC)   Bilateral pleural effusion   Acute respiratory failure with hypoxia (HCC)   Positive D dimer   Protein calorie malnutrition (HCC)   Stage 1 decubitus ulcer/LOW THORACIC spine   Physical deconditioning   Hypokalemia   NICM (nonischemic cardiomyopathy) (HCC)   LBBB (left bundle branch block)  Acute respiratory failure with hypoxia secondary to acute on chronic systolic and diastolic heart failure -  Has not required her when necessary BiPAP and over 24 hours -  Blood pressure improving -  On beta blocker -  Not on ACEI due to hypotension -  Resume Lasix 40 mg IV daily -  Hold parameter placed on beta blocker for hypotension -  troponins negative -  ST segment elevations appear unchanged from prior -  ean oxygen as tolerated -  Rales are dramatically improved today -  Transition to room air  Atrial fibrillation with RVR, CHADVASc = 4, spontaneously reverted to A. Fib -  Appreciate cardiology assistance -  Discontinued diltiazem and Rythmol -  Continue carvedilol -  Amiodarone started on 7/3 and decreased on 7/4 due to nausea  Hypotension, likely secondary to progressive heart failure and diuresis with multiple antihypertensives, improving -  Transition to oral lasix   Positive D dimer, CTA of chest negative for PE   Stage 1 decubitus ulcer/LOW THORACIC spine -Patient has lost weight since her fall and humeral fracture last year; she is under significant stress assisting in the care of her husband with Alzheimer's -Patient spine protrudes and there is a stage I decubitus over the low thoracic  spine -Air mattress   Physical deconditioning, physical therapy regimen no follow-up.  Rolling walker ordered.   Anxiety disorder, stable, continue Tranxene and Zoloft  Hypokalemia, improving, continue oral potassium supplementation  DVT prophylaxis: Eliquis  Code Status: Full code Family Communication:  Patient and her son.   Disposition Plan:    Transfer to telemetry.  Possibly d/c Wed to home.  Consultants:   EP, Dr. Lovena Le, Dr. Rayann Heman was unifocal and absolutely  Procedures:  None  Antimicrobials:   Ceftriaxone and azithromycin 1    Subjective:  Patient states that her dyspnea is improving. She denies lightheadedness or dizziness.  Denies chest pains but has been having some nausea.    Objective: Filed Vitals:   04/30/16 1046 04/30/16 1104 04/30/16 1226 04/30/16 1325  BP: 108/61 127/68 99/61 98/78   Pulse: 75 88 78 141  Temp:   97.9 F (36.6 C)   TempSrc:      Resp: 23 23 19    Height:      Weight:      SpO2: 94% 97% 93%     Intake/Output Summary (Last 24 hours) at 04/30/16 1409 Last data filed at 04/30/16 1300  Gross per 24 hour  Intake   1443 ml  Output   1850 ml  Net   -407 ml   Filed Weights   04/28/16 0449 04/29/16 0302 04/30/16 0500  Weight: 62.596 kg (138 lb) 62.3 kg (137 lb 5.6 oz) 59.5 kg (131 lb 2.8 oz)    Examination:  General exam:  Adult Female. No acute distress HEENT:  NCAT, MMM Respiratory system:  CTAB Cardiovascular system:  RRR, 2/6 systolic murmur.  Telemetry: converted to SR this morning Gastrointestinal system: Normal active bowel sounds, soft, nondistended, nontender. MSK:  Decreased tone and bulk Neuro:  Grossly intact, able to sit up in bed with minimal assistance    Data Reviewed: I have personally reviewed following labs and imaging studies  CBC:  Recent Labs Lab 04/25/16 1221 04/27/16 1448 04/30/16 0416  WBC 7.8 10.5 6.6  NEUTROABS  --  8.2*  --   HGB 13.0 13.1 12.3  HCT 38.5 40.5 37.9  MCV 90.8 91.8 91.3  PLT 263 250 [id number]   Basic Metabolic Panel:  Recent Labs Lab 04/25/16 2233  04/27/16 0350 04/27/16 1448 04/28/16 0008 04/29/16 0705 04/30/16 0416  NA  --   < > 134* 132* 134* 133* 133*  K  --   < > 3.2* 3.4* 3.1* 3.4* 4.5  CL  --   < > 99* 96* 96* 98* 101  CO2  --   < > 26 29 30 27 27   GLUCOSE  --   < > 131* 108* 92 124* 99  BUN  --   < > 22*  23* 19 21* 21*  CREATININE  --   < > 0.80 0.86 0.92 0.73 0.69  CALCIUM  --   < > 8.6* 8.3* 8.3* 8.6* 8.8*  MG 1.9  --   --   --   --   --   --   < > = values in this interval not displayed. GFR: Estimated Creatinine Clearance: 48.1 mL/min (by C-G formula based on Cr of 0.69). Liver Function Tests:  Recent Labs Lab 04/27/16 1448  AST 26  ALT 56*  ALKPHOS 81  BILITOT 0.7  PROT 6.0*  ALBUMIN 3.2*   No results for input(s): LIPASE, AMYLASE in the last 168 hours. No results for input(s): AMMONIA in the last 168 hours. Coagulation Profile:  No results for input(s): INR, PROTIME in the last 168 hours. Cardiac Enzymes:  Recent Labs Lab 04/25/16 2233 04/26/16 0529 04/27/16 1448 04/27/16 1857 04/28/16 0008  TROPONINI 0.06* 0.04* 0.03* 0.03* <0.03   BNP (last 3 results) No results for input(s): PROBNP in the last 8760 hours. HbA1C: No results for input(s): HGBA1C in the last 72 hours. CBG: No results for input(s): GLUCAP in the last 168 hours. Lipid Profile: No results for input(s): CHOL, HDL, LDLCALC, TRIG, CHOLHDL, LDLDIRECT in the last 72 hours. Thyroid Function Tests: No results for input(s): TSH, T4TOTAL, FREET4, T3FREE, THYROIDAB in the last 72 hours. Anemia Panel: No results for input(s): VITAMINB12, FOLATE, FERRITIN, TIBC, IRON, RETICCTPCT in the last 72 hours. Urine analysis:    Component Value Date/Time   COLORURINE YELLOW 04/23/2015 2023   APPEARANCEUR CLOUDY* 04/23/2015 2023   LABSPEC 1.014 04/23/2015 2023   PHURINE 7.0 04/23/2015 2023   GLUCOSEU NEGATIVE 04/23/2015 2023   GLUCOSEU NEG mg/dL 10/09/2010 0343   HGBUR NEGATIVE 04/23/2015 2023   BILIRUBINUR NEGATIVE 04/23/2015 2023   KETONESUR 15* 04/23/2015 2023   PROTEINUR NEGATIVE 04/23/2015 2023   UROBILINOGEN 0.2 04/23/2015 2023   NITRITE POSITIVE* 04/23/2015 2023   LEUKOCYTESUR TRACE* 04/23/2015 2023   Sepsis Labs: @LABRCNTIP (procalcitonin:4,lacticidven:4)  ) Recent Results (from the past 240  hour(s))  MRSA PCR Screening     Status: None   Collection Time: 04/25/16 10:16 PM  Result Value Ref Range Status   MRSA by PCR NEGATIVE NEGATIVE Final    Comment:        The GeneXpert MRSA Assay (FDA approved for NASAL specimens only), is one component of a comprehensive MRSA colonization surveillance program. It is not intended to diagnose MRSA infection nor to guide or monitor treatment for MRSA infections.   Culture, blood (routine x 2) Call MD if unable to obtain prior to antibiotics being given     Status: None   Collection Time: 04/25/16 10:44 PM  Result Value Ref Range Status   Specimen Description BLOOD LEFT ARM  Final   Special Requests IN PEDIATRIC BOTTLE 3ML  Final   Culture NO GROWTH 5 DAYS  Final   Report Status 04/30/2016 FINAL  Final  Culture, blood (routine x 2) Call MD if unable to obtain prior to antibiotics being given     Status: None   Collection Time: 04/25/16 10:50 PM  Result Value Ref Range Status   Specimen Description BLOOD RIGHT ARM  Final   Special Requests BOTTLES DRAWN AEROBIC AND ANAEROBIC 5ML  Final   Culture NO GROWTH 5 DAYS  Final   Report Status 04/30/2016 FINAL  Final      Radiology Studies: Dg Chest Port 1 View  04/30/2016  CLINICAL DATA:  Followup CHF EXAM: PORTABLE CHEST 1 VIEW COMPARISON:  04/27/2016 FINDINGS: Cardiomegaly. Improving edema pattern. Small bilateral pleural effusions with bibasilar atelectasis. No acute bony abnormality. Hardware within the proximal left humerus. IMPRESSION: Improving pulmonary edema pattern. Continued bibasilar atelectasis and bilateral effusions. Electronically Signed   By: Rolm Baptise M.D.   On: 04/30/2016 08:46     Scheduled Meds: . amiodarone  200 mg Oral TID WC  . apixaban  2.5 mg Oral BID  . carvedilol  12.5 mg Oral BID WC  . estradiol  0.5 mg Oral Daily  . feeding supplement (ENSURE ENLIVE)  237 mL Oral BID BM  . furosemide  40 mg Oral Daily  . potassium chloride  40 mEq Oral Daily  .  sertraline  75  mg Oral Daily  . sodium chloride flush  3 mL Intravenous Q12H   Continuous Infusions:     LOS: 5 days    Time spent: 30 min    Janece Canterbury, MD [facility] Pager [phone]  If 7PM-7AM, please contact night-coverage [email or web address] Password [email or web address] 04/30/2016, 2:09 PM

## 2016-04-30 NOTE — Progress Notes (Signed)
Pt has Stage I Left medial of spine.  Since pt is allergic to adhesive tape, I modified a Allevyn foam dressing by removing the adhesive tape from external side of pathc, and secured with paper tape.

## 2016-04-30 NOTE — Progress Notes (Signed)
04/30/16 @ 1739, pt had 9-beat run of V-Tach.  VSS, asymtomatic, as I was in room at this time.  I notified Dr. Sheran Fava, and she is aware.

## 2016-04-30 NOTE — Progress Notes (Signed)
Patient Name: Yvonne [name]      SUBJECTIVE: In and out of sinus over the weekend-- Better today Although loose bowels are a concern There is also some nausea She has intermittent CP similar to what she has had for years   Propafenone stopped Echo EF 25-30%  ? Rate related with severe Biatrial  Enlargement  Both new from 2010      Past Medical History  Diagnosis Date  . Hemorrhage of gastrointestinal tract, unspecified     a. occurred on Aleve  . Anxiety state, unspecified   . Left ventricular systolic dysfunction     hx of mild  . History of peptic ulcer   . Esophageal reflux   . Mitral valve insufficiency and aortic valve insufficiency     a. Remote echo 2010: mild focal basal hypertrophy of septum, EF 50-55%, mid-distal inferoseptal myocardium, grade 1 DD, LBBB, mild AI/MR, PASP mildly increased.  . Wears glasses   . Paroxysmal supraventricular tachycardia ([place])   . LBBB (left bundle branch block)   . Cataract   . Cancer (HCC)     basil cell carcinomas removed  . Arthritis   . Atrial fibrillation ([place])     a. Per Dr. Olin Pia note, one prior episode of afib.    Scheduled Meds:  Scheduled Meds: . amiodarone  400 mg Oral BID  . apixaban  2.5 mg Oral BID  . carvedilol  12.5 mg Oral BID WC  . estradiol  0.5 mg Oral Daily  . feeding supplement (ENSURE ENLIVE)  237 mL Oral BID BM  . furosemide  40 mg Intravenous Daily  . potassium chloride  40 mEq Oral Daily  . sertraline  75 mg Oral Daily  . sodium chloride flush  3 mL Intravenous Q12H   Continuous Infusions:  sodium chloride, acetaminophen, clorazepate, metoprolol, ondansetron (ZOFRAN) IV, ondansetron (ZOFRAN) IV, sodium chloride flush    PHYSICAL EXAM Filed Vitals:   04/30/16 0600 04/30/16 0740 04/30/16 0800 04/30/16 1046  BP: 138/70 100/72 104/67 108/61  Pulse: 101 76 74 75  Temp:  98 F (36.7 C)    TempSrc:  Oral    Resp: 22 23  23   Height:      Weight:      SpO2: 90% 92%  94%     General appearance: alert, cooperative and no distress Neck: JVD - 6 cm above sternal notch, no adenopathy, no carotid bruit, no JVD, supple, symmetrical, trachea midline and thyroid not enlarged, symmetric, no tenderness/mass/nodules Lungs: clear to auscultation bilaterally Heart: RRR  Extremities: edema none Skin: Skin color, texture, turgor normal. No rashes or lesions Neurologic: Alert and oriented X 3, normal strength and tone. Normal symmetric reflexes. Normal coordination and gait  TELEMETRY: Reviewed telemetry pt in sinus with HR 60-70s    Intake/Output Summary (Last 24 hours) at 04/30/16 1051 Last data filed at 04/30/16 1000  Gross per 24 hour  Intake   1563 ml  Output    550 ml  Net   1013 ml    LABS: Basic Metabolic Panel:  Recent Labs Lab 04/25/16 1221 04/25/16 2233 04/26/16 0529 04/27/16 0350 04/27/16 1448 04/28/16 0008 04/29/16 0705 04/30/16 0416  NA 135  --  137 134* 132* 134* 133* 133*  K 4.4  --  3.6 3.2* 3.4* 3.1* 3.4* 4.5  CL 103  --  102 99* 96* 96* 98* 101  CO2 23  --  28 26 29 30 27  27  GLUCOSE 119*  --  94 131* 108* 92 124* 99  BUN 25*  --  18 22* 23* 19 21* 21*  CREATININE 0.67  --  0.73 0.80 0.86 0.92 0.73 0.69  CALCIUM 9.0  --  8.4* 8.6* 8.3* 8.3* 8.6* 8.8*  MG  --  1.9  --   --   --   --   --   --    Cardiac Enzymes:  Recent Labs  04/27/16 1448 04/27/16 1857 04/28/16 0008  TROPONINI 0.03* 0.03* <0.03   CBC:  Recent Labs Lab 04/25/16 1221 04/27/16 1448 04/30/16 0416  WBC 7.8 10.5 6.6  NEUTROABS  --  8.2*  --   HGB 13.0 13.1 12.3  HCT 38.5 40.5 37.9  MCV 90.8 91.8 91.3  PLT 263 250 247   PROTIME: No results for input(s): LABPROT, INR in the last 72 hours. Liver Function Tests:  Recent Labs  04/27/16 1448  AST 26  ALT 56*  ALKPHOS 81  BILITOT 0.7  PROT 6.0*  ALBUMIN 3.2*   No results for input(s): LIPASE, AMYLASE in the last 72 hours. BNP: BNP (last 3 results)  Recent Labs  04/25/16 1221 04/26/16 0530  04/29/16 0706  BNP 2057.2* 1137.8* 462.2*    ProBNP (last 3 results) No results for input(s): PROBNP in the last 8760 hours.  D-Dimer: No results for input(s): DDIMER in the last 72 hours. Hemoglobin A1C: No results for input(s): HGBA1C in the last 72 hours. Fasting Lipid Panel:  CXR 7/1 effusion and ongoing CHF   ASSESSMENT AND PLAN:  Principal Problem:   Acute systolic congestive heart failure, NYHA class 3 (HCC) Active Problems:   GERD   Atrial fibrillation with RVR (HCC)   Bilateral pleural effusion   Acute respiratory failure with hypoxia (HCC)   Positive D dimer   Protein calorie malnutrition (HCC)   Stage 1 decubitus ulcer/LOW THORACIC spine   Physical deconditioning   Hypokalemia   NICM (nonischemic cardiomyopathy) (HCC)   LBBB (left bundle branch block)  Will continue amio, but with nausea will decrease dose 400 bid>>200 q8  Has converted to sinus If BP ok, would add  Low dose lisinopril in am with LV dysfunction BNP much improved Continue apixoban  Decrease diuretics  IV>>PO  Maybe home in am    Signed, Virl Axe MD  04/30/2016

## 2016-05-01 DIAGNOSIS — E46 Unspecified protein-calorie malnutrition: Secondary | ICD-10-CM

## 2016-05-01 DIAGNOSIS — J9601 Acute respiratory failure with hypoxia: Secondary | ICD-10-CM

## 2016-05-01 DIAGNOSIS — K219 Gastro-esophageal reflux disease without esophagitis: Secondary | ICD-10-CM

## 2016-05-01 DIAGNOSIS — J90 Pleural effusion, not elsewhere classified: Secondary | ICD-10-CM

## 2016-05-01 DIAGNOSIS — R791 Abnormal coagulation profile: Secondary | ICD-10-CM

## 2016-05-01 LAB — BASIC METABOLIC PANEL
ANION GAP: 7 (ref 5–15)
BUN: 20 mg/dL (ref 6–20)
CO2: 31 mmol/L (ref 22–32)
Calcium: 8.9 mg/dL (ref 8.9–10.3)
Chloride: 97 mmol/L — ABNORMAL LOW (ref 101–111)
Creatinine, Ser: 0.75 mg/dL (ref 0.44–1.00)
Glucose, Bld: 93 mg/dL (ref 65–99)
POTASSIUM: 3.7 mmol/L (ref 3.5–5.1)
SODIUM: 135 mmol/L (ref 135–145)

## 2016-05-01 MED ORDER — AMIODARONE HCL 200 MG PO TABS: ORAL_TABLET | ORAL | Status: DC

## 2016-05-01 MED ORDER — CARVEDILOL 12.5 MG PO TABS: 12.5000 mg | ORAL_TABLET | Freq: Two times a day (BID) | ORAL | Status: DC

## 2016-05-01 MED ORDER — FUROSEMIDE 20 MG PO TABS: 40.0000 mg | ORAL_TABLET | Freq: Every day | ORAL | Status: DC

## 2016-05-01 NOTE — Progress Notes (Signed)
Physical Therapy Treatment Patient Details Name: Yvonne [name] MRN: [id number] DOB: 23-Oct-1929 Today's Date: 05/01/2016    History of Present Illness 82 y.o. female admitted to [facility] on 04/25/16 for worsening dyspnea.  Dx with bil pleural effusions, acute respiratory failure with hypoxia due to acute on chronic systolic and diastolic heart failure, A-fib with RVR, (+) D dimer (negative PE), and stage 1 decubitus on her lower thoracic spine.  Pt with significant PMHx of anxiety, left ventricular systolic dysfunction, mitral valve insufficiency and aortic valve insufficiency, paroxysmal supraventricular tachycardia, LBBB, A-fib, L knee arthroscopy, ORIF L wrist, L foot surgery, ORIF L humerus fx.       PT Comments    Pt progressing well with PT, able to ambulate 400 feet with min guard/supervision. While ambulating without device, pt with increased instability and increased fall risk. Improved pattern and safety when using rw. Discussed recommendation of using rw at home with patient and son who both verbalize agreement. Recommending HHPT services to work on strength, endurance and gait stability. Anticipate D/C to home when medically ready. Pt and son deny any questions or concerns.   Follow Up Recommendations  Home health PT;Supervision - Intermittent     Equipment Recommendations  Rolling walker with 5" wheels    Recommendations for Other Services       Precautions / Restrictions Precautions Precautions: Fall Restrictions Weight Bearing Restrictions: No    Mobility  Bed Mobility Overal bed mobility: Modified Independent             General bed mobility comments: HOB elevated  Transfers Overall transfer level: Independent Equipment used: None Transfers: Sit to/from Stand Sit to Stand: Independent         General transfer comment: safe technique demonstrated and no instability  Ambulation/Gait Ambulation/Gait assistance: Supervision;Min guard Ambulation Distance  (Feet): 400 Feet Assistive device: None;Rolling walker (2 wheeled) Gait Pattern/deviations: Step-through pattern Gait velocity: mild decrease   General Gait Details: ambulating 200 ft without device and then 200 ft with rw. Pt with noted mild instability without use of device but no gross loss of balance (min guard). Used rw following ambulation without device for comparison, pt with improved stability when using rw. Discussed with pt and son, recommendation of using rw at home at this time. Pt and son verbalize understanding and agreement.    Stairs Stairs:  (pt/son declined stairs, both report feeling confident. )          Wheelchair Mobility    Modified Rankin (Stroke Patients Only)       Balance Overall balance assessment: Needs assistance Sitting-balance support: No upper extremity supported Sitting balance-Leahy Scale: Good     Standing balance support: No upper extremity supported Standing balance-Leahy Scale: Fair                      Cognition Arousal/Alertness: Awake/alert Behavior During Therapy: WFL for tasks assessed/performed Overall Cognitive Status: Within Functional Limits for tasks assessed                      Exercises      General Comments        Pertinent Vitals/Pain Pain Assessment: Faces Faces Pain Scale: Hurts little more Pain Location: low back Pain Descriptors / Indicators: Sore Pain Intervention(s): Monitored during session;Repositioned    Home Living                      Prior Function  PT Goals (current goals can now be found in the care plan section) Acute Rehab PT Goals Patient Stated Goal: get home PT Goal Formulation: With patient Time For Goal Achievement: 05/10/16 Potential to Achieve Goals: Good Progress towards PT goals: Progressing toward goals    Frequency  Min 3X/week    PT Plan Current plan remains appropriate    Co-evaluation             End of Session  Equipment Utilized During Treatment: Gait belt Activity Tolerance: Patient tolerated treatment well Patient left: in chair;with call bell/phone within reach;with family/visitor present     Time: 1010-1030 PT Time Calculation (min) (ACUTE ONLY): 20 min  Charges:  $Gait Training: 8-22 mins                    G Codes:      Cassell Clement, PT, CSCS Pager [phone] Office [phone]  05/01/2016, 10:42 AM

## 2016-05-01 NOTE — Progress Notes (Signed)
Orders received for pt discharge.  Discharge summary printed and reviewed with pt.  Explained medication regimen, and pt had no further questions at this time.  IV removed and site remains clean, dry, intact.  Telemetry removed.  Pt in stable condition and awaiting transport. 

## 2016-05-01 NOTE — Progress Notes (Signed)
SUBJECTIVE: The patient is complaining of nausea this morning. This is normal for her per her report and pre-dates amiodarone. At this time, she denies chest pain, shortness of breath, or any new concerns.  CURRENT MEDICATIONS: . amiodarone  200 mg Oral TID WC  . apixaban  2.5 mg Oral BID  . carvedilol  12.5 mg Oral BID WC  . estradiol  0.5 mg Oral Daily  . feeding supplement (ENSURE ENLIVE)  237 mL Oral BID BM  . furosemide  40 mg Oral Daily  . potassium chloride  40 mEq Oral Daily  . sertraline  75 mg Oral Daily  . sodium chloride flush  3 mL Intravenous Q12H      OBJECTIVE: Physical Exam: Filed Vitals:   04/30/16 1721 04/30/16 2046 05/01/16 0030 05/01/16 0604  BP: 96/63 103/60 93/52 107/67  Pulse: 75 69 74 82  Temp: 97.6 F (36.4 C) 97.6 F (36.4 C) 97.7 F (36.5 C) 97.7 F (36.5 C)  TempSrc: Oral Oral Oral Oral  Resp: 20 20 20 20   Height: 5\' 6"  (1.676 m)     Weight: 121 lb 11.1 oz (55.2 kg)   121 lb 3.2 oz (54.976 kg)  SpO2: 97% 96% 94%     Intake/Output Summary (Last 24 hours) at 05/01/16 0834 Last data filed at 04/30/16 1600  Gross per 24 hour  Intake   1203 ml  Output   2000 ml  Net   -797 ml    Telemetry reveals sinus rhythm, AF yesterday afternoon for about an hour, PVC's  GEN- The patient is well appearing, alert and oriented x 3 today.   Head- normocephalic, atraumatic Eyes-  Sclera clear, conjunctiva pink Ears- hearing intact Oropharynx- clear Neck- supple  Lungs- Clear to ausculation bilaterally, normal work of breathing Heart- Regular rate and rhythm GI- soft, NT, ND, + BS Extremities- no clubbing, cyanosis, or edema Skin- no rash or lesion Psych- euthymic mood, full affect Neuro- strength and sensation are intact  LABS: Basic Metabolic Panel:  Recent Labs  04/30/16 0416 05/01/16 0329  NA 133* 135  K 4.5 3.7  CL 101 97*  CO2 27 31  GLUCOSE 99 93  BUN 21* 20  CREATININE 0.69 0.75  CALCIUM 8.8* 8.9   CBC:  Recent Labs  04/30/16 0416  WBC 6.6  HGB 12.3  HCT 37.9  MCV 91.3  PLT 247    RADIOLOGY: Dg Chest 2 View 04/26/2016  CLINICAL DATA:  Cough for 2 weeks EXAM: CHEST  2 VIEW COMPARISON:  04/25/2016 FINDINGS: Cardiomediastinal silhouette is stable. Bilateral moderate pleural effusion with bilateral basilar atelectasis or infiltrate. Osteopenia and degenerative changes mid thoracic spine. No pulmonary edema. IMPRESSION: No pulmonary edema. Bilateral moderate pleural effusion with bilateral basilar atelectasis or infiltrate. Electronically Signed   By: Lahoma Crocker M.D.   On: 04/26/2016 14:29    ASSESSMENT AND PLAN:  Principal Problem:   Acute systolic congestive heart failure, NYHA class 3 (HCC) Active Problems:   GERD   Atrial fibrillation with RVR (HCC)   Bilateral pleural effusion   Acute respiratory failure with hypoxia (HCC)   Positive D dimer   Protein calorie malnutrition (HCC)   Stage 1 decubitus ulcer/LOW THORACIC spine   Physical deconditioning   Hypokalemia   NICM (nonischemic cardiomyopathy) (HCC)   LBBB (left bundle branch block)  1.  Persistent atrial fibrillation Maintaining SR mostly with amiodarone Continue 200mg  tid for 2 weeks then decrease to 200mg  daily Continue Eliquis for CHADS2VASC of 4 (  low dose for age and body weight)  2.  LV dysfunction - ?tachycardia mediated Continue Carvedilol BP is low and I am not sure she would tolerate addition of ACE-I at this time, will defer to Dr Caryl Comes Plan repeat echo after 3 months of SR  3.  Nausea Chronic for patient and pre-dates amiodarone   Will arrange outpatient follow up with EP.  Ok from EP standpoint to discharge to home Electrophysiology team to see as needed while here. Please call with questions.  Chanetta Marshall, NP 05/01/2016 8:38 AM  Pt gone prior to my seeing her Will try and add ACE as outpt

## 2016-05-01 NOTE — Discharge Summary (Signed)
Physician Discharge Summary  Yvonne [name] [id number] DOB: 05/25/1929 DOA: 04/25/2016  PCP: Mathews Argyle, MD  Admit date: 04/25/2016 Discharge date: 05/01/2016  Admitted From: Home Disposition:  Home  Recommendations for Outpatient Follow-up:  1. Follow up with PCP in 1-2 weeks 2. Please obtain BMP/CBC in one week  Home Health:None Equipment/Devices: Rolling walker  Discharge Condition: Stable CODE STATUS: full code Diet recommendation: Heart Healthy   Brief/Interim Summary: The patient is an 82 year old female with history of GERD, prior humeral fracture after a fall secondary to a syncopal episode, left bundle branch block, mitral regurgitation, remote atrial fibrillation previously on aspirin. She was evaluated at the end of June for progressive shortness of breath by cardiology and was found to be in atrial fibrillation. She was started on Eliquis on 6/28. She presented to the hospital with worsening dyspnea. In the emergency department, her heart rate was in the 120s to 140s, blood pressure stable. She was breathing 20-30 times a minute and was placed on oxygen via nasal cannula. Her chest x-ray demonstrated a small left pleural effusion and trace right pleural effusion and no pulmonary edema. CT of the chest demonstrated large bilateral pleural effusions with associated lower lobe consolidation versus edema. Her BNP was 2058. ECHO demonstrated biventricular failure. Troponin was negative. She was seen by electrophysiology/cardiology. She was initially continued on rythmol and a low dose diltiazem gtt was added. She spontaneously cardioverted and a few hours after going into the sinus rhythm on 7/1, she developed acute dyspnea requiring urgent bipap. She used bipap on and off for about 24 hours and continued to diurese. She has been intermittently hypotensive to the [id number] systolic but has remained asymptomatic. Her rythmol was discontinued and she has been started on  amiodarone and beta blocker for rate and rhythm control. She has been in and out of SR for the last few days, but seems to be more persistently in SR since starting amio.  Discharge Diagnoses:  Principal Problem:   Acute systolic congestive heart failure, NYHA class 3 (HCC) Active Problems:   GERD   Atrial fibrillation with RVR (HCC)   Bilateral pleural effusion   Acute respiratory failure with hypoxia (HCC)   Positive D dimer   Protein calorie malnutrition (HCC)   Stage 1 decubitus ulcer/LOW THORACIC spine   Physical deconditioning   Hypokalemia   NICM (nonischemic cardiomyopathy) (HCC)   LBBB (left bundle branch block)   Acute respiratory failure with hypoxia  -Required BiPAP briefly on admission secondary to respiratory distress. -This is likely secondary to acute on chronic combined systolic/diastolic CHF. -This is improved after diuresis, patient currently on room air. -No evidence of pneumonia, this is resolved.  Acute on chronic systolic and diastolic heart failure, LVEF is 25-30% - Could be secondary to tachycardiomyopathy, last 2-D echo in 2010 ejection fraction was 50-55%. -  2-D echo in 2010 showed inferior septal hypokinesis. - She might need evaluation with cardiac cath to rule out ischemic heart disease as outpatient (also has LBBB). - Started initially on IV Lasix, on discharge Lasix 20 mg daily. - Discharged on Coreg 12.5 mg twice a day. -  No ACEI or ARB secondary to low blood pressure, can consider to add that as outpatient. - Had left bundle branch block which is unchanged from previously.  Atrial fibrillation with RVR, CHADVASc = 4, spontaneously reverted to A. Fib - Appreciate cardiology assistance, currently sinus rhythm converted during this hospital stay. - Discontinued diltiazem and Rythmol - Started on Coreg and  amiodarone, amiodarone 200 mg 3 times a day for 2 weeks then daily.  Mitral regurgitation - Per echo has significant severe mitral  regurgitation resulting in severe left atrial dilatation - This was likely contributing to her shortness of breath symptoms, and the left atrial dilatation contributing to her A. fib.  Hypotension, likely secondary to progressive heart failure and diuresis with multiple antihypertensives, improving - Transition to oral lasix  Positive D dimer, CTA of chest negative for PE  Stage 1 decubitus ulcer/LOW THORACIC spine -Patient has lost weight since her fall and humeral fracture last year; she is under significant stress assisting in the care of her husband with Alzheimer's -Patient spine protrudes and there is a stage I decubitus over the low thoracic spine -Air mattress  Physical deconditioning, physical therapy regimen no follow-up. Rolling walker ordered.  Anxiety disorder, stable, continue Tranxene and Zoloft  Hypokalemia, resolved, this was likely secondary to IV diuresis.  Discharge Instructions      Discharge Instructions    Diet - low sodium heart healthy    Complete by:  As directed      Increase activity slowly    Complete by:  As directed             Medication List    STOP taking these medications        propafenone 150 MG tablet  Commonly known as:  RYTHMOL      TAKE these medications        amiodarone 200 MG tablet  Commonly known as:  PACERONE  200 mg 3 times a day for 3 weeks and then 200 mg daily.     apixaban 2.5 MG Tabs tablet  Commonly known as:  ELIQUIS  Take 1 tablet (2.5 mg total) by mouth 2 (two) times daily.     aspirin 81 MG tablet  Take 81 mg by mouth daily.     Cal-Mag-Zinc-D Tabs  Take 2 tablets by mouth daily.     carvedilol 12.5 MG tablet  Commonly known as:  COREG  Take 1 tablet (12.5 mg total) by mouth 2 (two) times daily with a meal.     clorazepate 7.5 MG tablet  Commonly known as:  TRANXENE  Take 7.5 mg by mouth 3 (three) times daily as needed for anxiety.     docusate sodium 100 MG capsule  Commonly known as:   COLACE  Take 100-300 mg by mouth daily as needed (constipation).     estradiol 1 MG tablet  Commonly known as:  ESTRACE  Take 0.5 mg by mouth daily.     fluticasone 50 MCG/ACT nasal spray  Commonly known as:  FLONASE  Place 2 sprays into both nostrils daily as needed for allergies.     furosemide 20 MG tablet  Commonly known as:  LASIX  Take 2 tablets (40 mg total) by mouth daily.     multivitamin with minerals Tabs tablet  Take 1 tablet by mouth daily.     ondansetron 8 MG tablet  Commonly known as:  ZOFRAN  Take 8 mg by mouth every 8 (eight) hours as needed for nausea or vomiting.     sertraline 50 MG tablet  Commonly known as:  ZOLOFT  Take 75 mg by mouth daily.     sodium chloride 0.65 % Soln nasal spray  Commonly known as:  OCEAN  Place 2 sprays into both nostrils daily as needed for congestion.     vitamin C with rose hips 500 MG  tablet  Take 500 mg by mouth daily.       Follow-up Information    Follow up with Virl Axe, MD On 05/27/2016.   Specialty:  Cardiology   Why:  at 9:45AM    Contact information:   [address] [place] [address] [phone]      Allergies  Allergen Reactions  . Aleve [Naproxen Sodium] Other (See Comments)    Gi bleed  . Adhesive [Tape] Rash and Other (See Comments)    Skin redness  . Aspirin Other (See Comments)    Told to avoid high doses due to history of stomach ulcer    Consultations:  Cardiology   Procedures/Studies: Dg Chest 2 View  04/26/2016  CLINICAL DATA:  Cough for 2 weeks EXAM: CHEST  2 VIEW COMPARISON:  04/25/2016 FINDINGS: Cardiomediastinal silhouette is stable. Bilateral moderate pleural effusion with bilateral basilar atelectasis or infiltrate. Osteopenia and degenerative changes mid thoracic spine. No pulmonary edema. IMPRESSION: No pulmonary edema. Bilateral moderate pleural effusion with bilateral basilar atelectasis or infiltrate. Electronically Signed   By: Lahoma Crocker M.D.   On:  04/26/2016 14:29   Dg Chest 2 View  04/25/2016  CLINICAL DATA:  Cough, shortness of breath for 1 week EXAM: CHEST  2 VIEW COMPARISON:  04/23/2015 FINDINGS: Cardiomegaly again noted. There is small left pleural effusion. Trace right pleural effusion. Bilateral basilar atelectasis or infiltrate left greater than right. Osteopenia and mild degenerative changes thoracic spine. No pulmonary edema. IMPRESSION: Cardiomegaly. Small left pleural effusion. Trace right pleural effusion. Bilateral basilar atelectasis or infiltrate left greater than right. No pulmonary edema. Electronically Signed   By: Lahoma Crocker M.D.   On: 04/25/2016 12:42   Ct Angio Chest Pe W Or Wo Contrast  04/25/2016  CLINICAL DATA:  Increasing shortness of Breath EXAM: CT ANGIOGRAPHY CHEST WITH CONTRAST TECHNIQUE: Multidetector CT imaging of the chest was performed using the standard protocol during bolus administration of intravenous contrast. Multiplanar CT image reconstructions and MIPs were obtained to evaluate the vascular anatomy. CONTRAST:  80 mL Isovue 370. COMPARISON:  Plain film from earlier in the same day FINDINGS: Mediastinum/Lymph Nodes: Thoracic aorta is incompletely evaluated due to lack of contrast opacification. No definitive aneurysmal dilatation is seen. The pulmonary artery demonstrates no findings to suggest pulmonary embolism. No significant hilar or mediastinal adenopathy is noted. Coronary calcifications are seen. Lungs/Pleura: Large bilateral pleural effusions are noted. Lower lobe consolidation is noted associated with the effusions. No other focal nodule or pneumothorax is seen. Upper abdomen: No acute findings. Musculoskeletal: No chest wall mass or suspicious bone lesions identified. Review of the MIP images confirms the above findings. IMPRESSION: No evidence pulmonary embolism. Large bilateral pleural effusions with associated lower lobe consolidation. Electronically Signed   By: Inez Catalina M.D.   On: 04/25/2016  14:19   Dg Chest Port 1 View  04/30/2016  CLINICAL DATA:  Followup CHF EXAM: PORTABLE CHEST 1 VIEW COMPARISON:  04/27/2016 FINDINGS: Cardiomegaly. Improving edema pattern. Small bilateral pleural effusions with bibasilar atelectasis. No acute bony abnormality. Hardware within the proximal left humerus. IMPRESSION: Improving pulmonary edema pattern. Continued bibasilar atelectasis and bilateral effusions. Electronically Signed   By: Rolm Baptise M.D.   On: 04/30/2016 08:46   Dg Chest Port 1 View  04/27/2016  CLINICAL DATA:  Dyspnea EXAM: PORTABLE CHEST 1 VIEW COMPARISON:  Chest radiograph from earlier today. FINDINGS: Surgical hardware is seen in the proximal left humerus. Stable cardiomediastinal silhouette with mild cardiomegaly. No pneumothorax. Stable  small bilateral pleural effusions. Mild to moderate pulmonary edema appears slightly worsened. Patchy bibasilar lung opacities appear stable. IMPRESSION: 1. Mild-to-moderate congestive heart failure, slightly worsened. 2. Stable bilateral pleural effusions and bibasilar lung opacities favoring atelectasis. Electronically Signed   By: Ilona Sorrel M.D.   On: 04/27/2016 13:17   Dg Chest Port 1 View  04/27/2016  CLINICAL DATA:  Followup pleural effusions, dyspnea EXAM: PORTABLE CHEST 1 VIEW COMPARISON:  04/26/16 FINDINGS: Cardiac shadow is enlarged. Bilateral pleural effusions are again seen. There is likely some underlying atelectasis as well. Increase in the degree of vascular congestion is noted consistent with congestive failure. No bony abnormality is noted. IMPRESSION: Stable bibasilar effusion and atelectasis. Increased vascular congestion consistent with CHF. Electronically Signed   By: Inez Catalina M.D.   On: 04/27/2016 07:31   2-D echo done on 04/26/2016 Study Conclusions  - Left ventricle: The cavity size was normal. Wall thickness was normal. Systolic function was severely reduced. The estimated ejection fraction was in the range of 25% to  30%. - Aortic valve: There was mild regurgitation. - Mitral valve: There was severe regurgitation. - Left atrium: The atrium was moderately to severely dilated. - Right atrium: The atrium was moderately dilated. - Tricuspid valve: There was moderate regurgitation. - Pulmonary arteries: Systolic pressure was mildly to moderately increased. PA peak pressure: 39 mm Hg (S). - Pericardium, extracardiac: There was a left pleural effusion.   Subjective:   Discharge Exam: Filed Vitals:   05/01/16 0030 05/01/16 0604  BP: 93/52 107/67  Pulse: 74 82  Temp: 97.7 F (36.5 C) 97.7 F (36.5 C)  Resp: 20 20   Filed Vitals:   04/30/16 1721 04/30/16 2046 05/01/16 0030 05/01/16 0604  BP: 96/63 103/60 93/52 107/67  Pulse: 75 69 74 82  Temp: 97.6 F (36.4 C) 97.6 F (36.4 C) 97.7 F (36.5 C) 97.7 F (36.5 C)  TempSrc: Oral Oral Oral Oral  Resp: 20 20 20 20   Height: 5\' 6"  (1.676 m)     Weight: 55.2 kg (121 lb 11.1 oz)   54.976 kg (121 lb 3.2 oz)  SpO2: 97% 96% 94%     General: Pt is alert, awake, not in acute distress Cardiovascular: RRR, S1/S2 +, no rubs, no gallops Respiratory: CTA bilaterally, no wheezing, no rhonchi Abdominal: Soft, NT, ND, bowel sounds + Extremities: no edema, no cyanosis    The results of significant diagnostics from this hospitalization (including imaging, microbiology, ancillary and laboratory) are listed below for reference.     Microbiology: Recent Results (from the past 240 hour(s))  MRSA PCR Screening     Status: None   Collection Time: 04/25/16 10:16 PM  Result Value Ref Range Status   MRSA by PCR NEGATIVE NEGATIVE Final    Comment:        The GeneXpert MRSA Assay (FDA approved for NASAL specimens only), is one component of a comprehensive MRSA colonization surveillance program. It is not intended to diagnose MRSA infection nor to guide or monitor treatment for MRSA infections.   Culture, blood (routine x 2) Call MD if unable to obtain prior  to antibiotics being given     Status: None   Collection Time: 04/25/16 10:44 PM  Result Value Ref Range Status   Specimen Description BLOOD LEFT ARM  Final   Special Requests IN PEDIATRIC BOTTLE 3ML  Final   Culture NO GROWTH 5 DAYS  Final   Report Status 04/30/2016 FINAL  Final  Culture, blood (routine x 2)  Call MD if unable to obtain prior to antibiotics being given     Status: None   Collection Time: 04/25/16 10:50 PM  Result Value Ref Range Status   Specimen Description BLOOD RIGHT ARM  Final   Special Requests BOTTLES DRAWN AEROBIC AND ANAEROBIC 5ML  Final   Culture NO GROWTH 5 DAYS  Final   Report Status 04/30/2016 FINAL  Final     Labs: BNP (last 3 results)  Recent Labs  04/25/16 1221 04/26/16 0530 04/29/16 0706  BNP 2057.2* 1137.8* 123456*   Basic Metabolic Panel:  Recent Labs Lab 04/25/16 2233  04/27/16 1448 04/28/16 0008 04/29/16 0705 04/30/16 0416 05/01/16 0329  NA  --   < > 132* 134* 133* 133* 135  K  --   < > 3.4* 3.1* 3.4* 4.5 3.7  CL  --   < > 96* 96* 98* 101 97*  CO2  --   < > 29 30 27 27 31   GLUCOSE  --   < > 108* 92 124* 99 93  BUN  --   < > 23* 19 21* 21* 20  CREATININE  --   < > 0.86 0.92 0.73 0.69 0.75  CALCIUM  --   < > 8.3* 8.3* 8.6* 8.8* 8.9  MG 1.9  --   --   --   --   --   --   < > = values in this interval not displayed. Liver Function Tests:  Recent Labs Lab 04/27/16 1448  AST 26  ALT 56*  ALKPHOS 81  BILITOT 0.7  PROT 6.0*  ALBUMIN 3.2*   No results for input(s): LIPASE, AMYLASE in the last 168 hours. No results for input(s): AMMONIA in the last 168 hours. CBC:  Recent Labs Lab 04/25/16 1221 04/27/16 1448 04/30/16 0416  WBC 7.8 10.5 6.6  NEUTROABS  --  8.2*  --   HGB 13.0 13.1 12.3  HCT 38.5 40.5 37.9  MCV 90.8 91.8 91.3  PLT 263 250 247   Cardiac Enzymes:  Recent Labs Lab 04/25/16 2233 04/26/16 0529 04/27/16 1448 04/27/16 1857 04/28/16 0008  TROPONINI 0.06* 0.04* 0.03* 0.03* <0.03   BNP: Invalid  input(s): POCBNP CBG: No results for input(s): GLUCAP in the last 168 hours. D-Dimer No results for input(s): DDIMER in the last 72 hours. Hgb A1c No results for input(s): HGBA1C in the last 72 hours. Lipid Profile No results for input(s): CHOL, HDL, LDLCALC, TRIG, CHOLHDL, LDLDIRECT in the last 72 hours. Thyroid function studies No results for input(s): TSH, T4TOTAL, T3FREE, THYROIDAB in the last 72 hours.  Invalid input(s): FREET3 Anemia work up No results for input(s): VITAMINB12, FOLATE, FERRITIN, TIBC, IRON, RETICCTPCT in the last 72 hours. Urinalysis    Component Value Date/Time   COLORURINE YELLOW 04/23/2015 2023   APPEARANCEUR CLOUDY* 04/23/2015 2023   LABSPEC 1.014 04/23/2015 2023   PHURINE 7.0 04/23/2015 2023   GLUCOSEU NEGATIVE 04/23/2015 2023   GLUCOSEU NEG mg/dL 10/09/2010 0343   HGBUR NEGATIVE 04/23/2015 2023   BILIRUBINUR NEGATIVE 04/23/2015 2023   KETONESUR 15* 04/23/2015 2023   PROTEINUR NEGATIVE 04/23/2015 2023   UROBILINOGEN 0.2 04/23/2015 2023   NITRITE POSITIVE* 04/23/2015 2023   LEUKOCYTESUR TRACE* 04/23/2015 2023   Sepsis Labs Invalid input(s): PROCALCITONIN,  WBC,  LACTICIDVEN Microbiology Recent Results (from the past 240 hour(s))  MRSA PCR Screening     Status: None   Collection Time: 04/25/16 10:16 PM  Result Value Ref Range Status   MRSA by PCR NEGATIVE NEGATIVE  Final    Comment:        The GeneXpert MRSA Assay (FDA approved for NASAL specimens only), is one component of a comprehensive MRSA colonization surveillance program. It is not intended to diagnose MRSA infection nor to guide or monitor treatment for MRSA infections.   Culture, blood (routine x 2) Call MD if unable to obtain prior to antibiotics being given     Status: None   Collection Time: 04/25/16 10:44 PM  Result Value Ref Range Status   Specimen Description BLOOD LEFT ARM  Final   Special Requests IN PEDIATRIC BOTTLE 3ML  Final   Culture NO GROWTH 5 DAYS  Final    Report Status 04/30/2016 FINAL  Final  Culture, blood (routine x 2) Call MD if unable to obtain prior to antibiotics being given     Status: None   Collection Time: 04/25/16 10:50 PM  Result Value Ref Range Status   Specimen Description BLOOD RIGHT ARM  Final   Special Requests BOTTLES DRAWN AEROBIC AND ANAEROBIC 5ML  Final   Culture NO GROWTH 5 DAYS  Final   Report Status 04/30/2016 FINAL  Final     Time coordinating discharge: Over 30 minutes  SIGNED:   Birdie Hopes, MD  [facility] 05/01/2016, 11:32 AM  If 7PM-7AM, please contact night-coverage [email or web address] Password [email or web address]

## 2016-05-02 ENCOUNTER — Telehealth: Payer: Self-pay | Admitting: Internal Medicine

## 2016-05-02 MED ORDER — POTASSIUM CHLORIDE ER 10 MEQ PO TBCR: 10.0000 meq | EXTENDED_RELEASE_TABLET | Freq: Every day | ORAL | Status: DC

## 2016-05-02 NOTE — Telephone Encounter (Signed)
Mr.Onnen( Son ) is calling because he has a question about he potassium Chloride. She takes lasik and he does not see a prescription for the potassium and has some questions . Please call   Thanks

## 2016-05-02 NOTE — Telephone Encounter (Signed)
Reviewed with Dr. Caryl Comes- ok to start potassium 10 meq once daily. I have notified the patient of this. She is due to see her PCP within a week. I have advised her to ask that they recheck her K+ level at that time. She voices understanding.

## 2016-05-08 ENCOUNTER — Ambulatory Visit: Payer: Medicare Other | Admitting: Nurse Practitioner

## 2016-05-14 ENCOUNTER — Other Ambulatory Visit: Payer: Self-pay

## 2016-05-14 MED ORDER — AMIODARONE HCL 200 MG PO TABS: ORAL_TABLET | ORAL | Status: DC

## 2016-05-15 ENCOUNTER — Other Ambulatory Visit: Payer: Self-pay | Admitting: *Deleted

## 2016-05-15 MED ORDER — FUROSEMIDE 20 MG PO TABS: 40.0000 mg | ORAL_TABLET | Freq: Every day | ORAL | Status: DC

## 2016-05-15 NOTE — Discharge Summary (Signed)
This is an addendum to the discharge summary was dictated by me on 05/01/2016  I was asked by [facility] nurse to verify the following. 1. Valvular disease Patient has both severe mitral regurgitation and moderate tricuspid regurgitation, based on the 2-D echo readings.  2. Malnutrition There is no diagnosis of malnutrition, per registered dietitian she has inadequate oral intake related to acute illness, poor oral appetite as evidenced by family/report. This is from Mrs. Molli Barrows RD note dictated on 04/26/2016.  Birdie Hopes Pager: [phone] 05/15/2016, 2:46 PM

## 2016-05-20 ENCOUNTER — Other Ambulatory Visit: Payer: Self-pay | Admitting: Internal Medicine

## 2016-05-27 ENCOUNTER — Ambulatory Visit (INDEPENDENT_AMBULATORY_CARE_PROVIDER_SITE_OTHER): Payer: Medicare Other | Admitting: Internal Medicine

## 2016-05-27 ENCOUNTER — Encounter: Payer: Self-pay | Admitting: Internal Medicine

## 2016-05-27 VITALS — BP 103/63 | HR 57 | Ht 66.0 in | Wt 120.6 lb

## 2016-05-27 DIAGNOSIS — I5043 Acute on chronic combined systolic (congestive) and diastolic (congestive) heart failure: Secondary | ICD-10-CM

## 2016-05-27 DIAGNOSIS — I447 Left bundle-branch block, unspecified: Secondary | ICD-10-CM

## 2016-05-27 DIAGNOSIS — I471 Supraventricular tachycardia: Secondary | ICD-10-CM | POA: Diagnosis not present

## 2016-05-27 DIAGNOSIS — I480 Paroxysmal atrial fibrillation: Secondary | ICD-10-CM | POA: Diagnosis not present

## 2016-05-27 LAB — BASIC METABOLIC PANEL
BUN: 29 mg/dL — AB (ref 7–25)
CHLORIDE: 98 mmol/L (ref 98–110)
CO2: 29 mmol/L (ref 20–31)
Calcium: 9.3 mg/dL (ref 8.6–10.4)
Creat: 0.8 mg/dL (ref 0.60–0.88)
Glucose, Bld: 99 mg/dL (ref 65–99)
POTASSIUM: 4.3 mmol/L (ref 3.5–5.3)
Sodium: 137 mmol/L (ref 135–146)

## 2016-05-27 MED ORDER — RIVAROXABAN 15 MG PO TABS: 15.0000 mg | ORAL_TABLET | Freq: Every day | ORAL | Status: DC

## 2016-05-27 NOTE — Patient Instructions (Addendum)
Medication Instructions: - Your physician has recommended you make the following change in your medication:  1) Increase lasix (furosemide) to 80 mg once every other day x 7 days 2) Stop Eliquis 3) Start Xarelto 15 mg one tablet by mouth every evening with supper ** Call us in about a month to let us know how your symptoms of dry mouth are doing.  Labwork: - Your physician recommends that you have lab work today: [employer]  Procedures/Testing: - Your physician has requested that you have an echocardiogram- in 3 months . Echocardiography is a painless test that uses sound waves to create images of your heart. It provides your doctor with information about the size and shape of your heart and how well your heart's chambers and valves are working. This procedure takes approximately one hour. There are no restrictions for this procedure.   Follow-Up: - Your physician recommends that you schedule a follow-up appointment in: 3 months with Dr. Caryl Comes  Any Additional Special Instructions Will Be Listed Below (If Applicable).     If you need a refill on your cardiac medications before your next appointment, please call your pharmacy.

## 2016-05-27 NOTE — Progress Notes (Signed)
so      Patient Care Team: Lajean Manes, MD as PCP - General (Internal Medicine)   HPI  Yvonne [name] is a 82 y.o. female Seen in followup for SVT for which she has elected  medical therapy. Echocardiogram 2010 demonstrated mild left ventricular dysfunction. She is chronic left bundle branch block.   She was hospitalized 6/17 for atrial fibrillation with a rapid rate in the context of pneumonia. She was started on amiodarone. Echocardiogram 6/17 EF 25-30%  She has had some nocturnal dyspnea with edema;  Some nausea and dry mouth    She is doing well on her SSRI.     Past Medical History:  Diagnosis Date  . Anxiety state, unspecified   . Arthritis   . Atrial fibrillation ([place])    a. Per Dr. Olin Pia note, one prior episode of afib.  . Cancer ([place])    basil cell carcinomas removed  . Cataract   . Esophageal reflux   . Hemorrhage of gastrointestinal tract, unspecified    a. occurred on Aleve  . History of peptic ulcer   . LBBB (left bundle branch block)   . Left ventricular systolic dysfunction    hx of mild  . Mitral valve insufficiency and aortic valve insufficiency    a. Remote echo 2010: mild focal basal hypertrophy of septum, EF 50-55%, mid-distal inferoseptal myocardium, grade 1 DD, LBBB, mild AI/MR, PASP mildly increased.  . Paroxysmal supraventricular tachycardia ([place])   . Wears glasses     Past Surgical History:  Procedure Laterality Date  . ABDOMINAL HYSTERECTOMY    . APPENDECTOMY    . BREAST SURGERY     biopsy  . CATARACT EXTRACTION Left   . ESOPHAGOGASTRODUODENOSCOPY    . ESOPHAGOGASTRODUODENOSCOPY (EGD) WITH PROPOFOL N/A 10/10/2015   Procedure: ESOPHAGOGASTRODUODENOSCOPY (EGD) WITH PROPOFOL;  Surgeon: Garlan Fair, MD;  Location: [facility];  Service: Endoscopy;  Laterality: N/A;  . FOOT SURGERY Left    hammer toe and bunion  . KNEE ARTHROSCOPY  2009   left  . OPEN REDUCTION INTERNAL FIXATION (ORIF) METACARPAL Left 11/05/2013   Procedure:  CLOSED REDUCTION/PINNING VS OPEN REDUCTION INTERNAL FIXATION (ORIF) LEFT SMALL  METACARPAL FRACTURE ;  Surgeon: Cammie Sickle., MD;  Location: [facility];  Service: Orthopedics;  Laterality: Left;  . ORIF HUMERUS FRACTURE Left 04/10/2015   Procedure: OPEN REDUCTION INTERNAL FIXATION (ORIF) PROXIMAL HUMERUS FRACTURE;  Surgeon: Marybelle Killings, MD;  Location: [facility];  Service: Orthopedics;  Laterality: Left;  . TONSILLECTOMY    . TUBAL LIGATION      Current Outpatient Prescriptions  Medication Sig Dispense Refill  . amiodarone (PACERONE) 200 MG tablet Take 200 mg by mouth daily.    Marland Kitchen apixaban (ELIQUIS) 2.5 MG TABS tablet Take 1 tablet (2.5 mg total) by mouth 2 (two) times daily. 180 tablet 3  . aspirin 81 MG tablet Take 81 mg by mouth daily.    . carvedilol (COREG) 12.5 MG tablet Take 12.5 mg by mouth 2 (two) times daily with a meal.    . clorazepate (TRANXENE) 7.5 MG tablet Take 7.5 mg by mouth 3 (three) times daily as needed for anxiety.     . docusate sodium (COLACE) 100 MG capsule Take 100-300 mg by mouth daily as needed (constipation).     Marland Kitchen estradiol (ESTRACE) 1 MG tablet Take 0.5 mg by mouth daily.    . fluticasone (FLONASE) 50 MCG/ACT nasal spray Place 2 sprays into both nostrils daily as needed  for allergies.   5  . furosemide (LASIX) 20 MG tablet Take 2 tablets (40 mg total) by mouth daily. 60 tablet 2  . ondansetron (ZOFRAN) 8 MG tablet Take 8 mg by mouth every 8 (eight) hours as needed for nausea or vomiting.   2  . potassium chloride SA (K-DUR,KLOR-CON) 20 MEQ tablet Take 20 mEq by mouth daily.    . sertraline (ZOLOFT) 50 MG tablet Take 75 mg by mouth daily.    . sodium chloride (OCEAN) 0.65 % SOLN nasal spray Place 2 sprays into both nostrils daily as needed for congestion.     No current facility-administered medications for this visit.     Allergies  Allergen Reactions  . Aleve [Naproxen Sodium] Other (See Comments)    Gi bleed  . Ambien [Zolpidem] Other  (See Comments)    Pt states "made me have crazy thoughts"  . Flexeril [Cyclobenzaprine] Other (See Comments)    Urinary rentention  . Adhesive [Tape] Rash and Other (See Comments)    Skin redness  . Aspirin Other (See Comments)    Told to avoid high doses due to history of stomach ulcer    Review of Systems negative except from HPI and PMH  Physical Exam BP 103/63   Pulse (!) 57   Ht 5\' 6"  (1.676 m)   Wt 120 lb 9.6 oz (54.7 kg)   SpO2 97%   BMI 19.47 kg/m  Well developed and well nourished in no acute distress HENT normal E scleral and icterus clear Neck Supple JVP 7-8 yes systolic Clear to ausculation Regular rate and rhythm, no murmurs gallops or rub Soft with active bowel sounds No clubbing cyanosis  Edema Alert and oriented, grossly normal motor and sensory function Skin Warm and Dry  Sinus rhythm at 57 Intervals 18/16/49 Left bundle branch block  Assessment and  Plan  SVT  Left bundle branch block  High risk medication surveillance  Mitral regurgitation Severe   Cardiomyopathy-Nonischemic  Congestive heart failure-chronic-systolic   Nausea  Dry mouth   The patient is holding sinus rhythm on amiodarone. Hopefully the nausea will abate as we reduce the dose.  Dry mouth has been associated with apixoban; we will give her samples of an alternative NOAC to see if it abates.  We will plan to reassess left ventricular function and mitral regurgitation with an echo in 3 months.  Given her modest volume overload and her nocturnal dyspnea, we will increase her diuretics. We will have her take 80 mg every other day for one week. I have reviewed this with her son.  In the event that there is not interval improvement in LV function, theoretically would consider CRT

## 2016-05-31 ENCOUNTER — Telehealth: Payer: Self-pay | Admitting: Internal Medicine

## 2016-05-31 ENCOUNTER — Other Ambulatory Visit: Payer: Self-pay | Admitting: *Deleted

## 2016-05-31 DIAGNOSIS — Z79899 Other long term (current) drug therapy: Secondary | ICD-10-CM

## 2016-05-31 NOTE — Telephone Encounter (Signed)
Follow-up      The pt is wanting a nurse to go over the blood work from the other day

## 2016-05-31 NOTE — Telephone Encounter (Signed)
PT AWARE OF  LAB RESULTS COPY  SENT  AT PT'S REQUEST  .Adonis Housekeeper

## 2016-06-10 ENCOUNTER — Other Ambulatory Visit: Payer: Medicare Other | Admitting: *Deleted

## 2016-06-10 DIAGNOSIS — Z79899 Other long term (current) drug therapy: Secondary | ICD-10-CM

## 2016-06-10 LAB — BASIC METABOLIC PANEL
BUN: 32 mg/dL — AB (ref 7–25)
CHLORIDE: 102 mmol/L (ref 98–110)
CO2: 32 mmol/L — AB (ref 20–31)
CREATININE: 0.84 mg/dL (ref 0.60–0.88)
Calcium: 8.9 mg/dL (ref 8.6–10.4)
Glucose, Bld: 82 mg/dL (ref 65–99)
Potassium: 4.6 mmol/L (ref 3.5–5.3)
Sodium: 140 mmol/L (ref 135–146)

## 2016-06-13 ENCOUNTER — Telehealth: Payer: Self-pay | Admitting: Internal Medicine

## 2016-06-13 MED ORDER — RIVAROXABAN 15 MG PO TABS: 15.0000 mg | ORAL_TABLET | Freq: Every day | ORAL | 11 refills | Status: DC

## 2016-06-13 NOTE — Telephone Encounter (Signed)
I left a message for the patient to call. 

## 2016-06-13 NOTE — Telephone Encounter (Signed)
New message  ° ° °Patient calling for test results.   °

## 2016-06-13 NOTE — Telephone Encounter (Signed)
The patient is aware of her results.  

## 2016-06-24 ENCOUNTER — Other Ambulatory Visit: Payer: Self-pay | Admitting: Internal Medicine

## 2016-08-15 ENCOUNTER — Encounter: Payer: Self-pay | Admitting: Internal Medicine

## 2016-08-20 ENCOUNTER — Other Ambulatory Visit (HOSPITAL_COMMUNITY): Payer: Medicare Other

## 2016-08-21 ENCOUNTER — Other Ambulatory Visit: Payer: Self-pay | Admitting: Internal Medicine

## 2016-08-21 MED ORDER — FUROSEMIDE 20 MG PO TABS: 40.0000 mg | ORAL_TABLET | Freq: Every day | ORAL | 8 refills | Status: DC

## 2016-08-27 ENCOUNTER — Ambulatory Visit: Payer: Medicare Other | Admitting: Internal Medicine

## 2016-09-03 ENCOUNTER — Other Ambulatory Visit: Payer: Self-pay

## 2016-09-03 ENCOUNTER — Encounter (INDEPENDENT_AMBULATORY_CARE_PROVIDER_SITE_OTHER): Payer: Self-pay

## 2016-09-03 ENCOUNTER — Ambulatory Visit (HOSPITAL_COMMUNITY): Payer: Medicare Other | Attending: Cardiology

## 2016-09-03 ENCOUNTER — Encounter: Payer: Self-pay | Admitting: Internal Medicine

## 2016-09-03 DIAGNOSIS — I4891 Unspecified atrial fibrillation: Secondary | ICD-10-CM | POA: Insufficient documentation

## 2016-09-03 DIAGNOSIS — I083 Combined rheumatic disorders of mitral, aortic and tricuspid valves: Secondary | ICD-10-CM | POA: Diagnosis not present

## 2016-09-03 DIAGNOSIS — I5043 Acute on chronic combined systolic (congestive) and diastolic (congestive) heart failure: Secondary | ICD-10-CM | POA: Diagnosis not present

## 2016-09-04 ENCOUNTER — Ambulatory Visit (INDEPENDENT_AMBULATORY_CARE_PROVIDER_SITE_OTHER): Payer: Medicare Other | Admitting: Internal Medicine

## 2016-09-04 VITALS — BP 114/60 | HR 67 | Ht 66.0 in | Wt 131.0 lb

## 2016-09-04 DIAGNOSIS — I447 Left bundle-branch block, unspecified: Secondary | ICD-10-CM

## 2016-09-04 DIAGNOSIS — I471 Supraventricular tachycardia: Secondary | ICD-10-CM | POA: Diagnosis not present

## 2016-09-04 DIAGNOSIS — I5043 Acute on chronic combined systolic (congestive) and diastolic (congestive) heart failure: Secondary | ICD-10-CM

## 2016-09-04 DIAGNOSIS — I480 Paroxysmal atrial fibrillation: Secondary | ICD-10-CM | POA: Diagnosis not present

## 2016-09-04 NOTE — Patient Instructions (Signed)
Medication Instructions: Your physician recommends that you continue on your current medications as directed. Please refer to the Current Medication list given to you today.   Labwork: None Ordered  Procedures/Testing: None Ordered  Follow-Up: Your physician wants you to follow-up in: 1 Year With Dr. Caryl Comes. You will receive a reminder letter in the mail two months in advance. If you don't receive a letter, please call our office to schedule the follow-up appointment.  Any Additional Special Instructions Will Be Listed Below (If Applicable).     If you need a refill on your cardiac medications before your next appointment, please call your pharmacy.

## 2016-09-04 NOTE — Progress Notes (Signed)
so      Patient Care Team: Lajean Manes, MD as PCP - General (Internal Medicine)   HPI  Yvonne [name] is a 82 y.o. female Seen in followup for SVT for which she has elected medical therapy.  Echocardiogram 2010 demonstrated mild left ventricular dysfunction. She is chronic left bundle branch block.  She was hospitalized 6/17 for atrial fibrillation with a rapid rate in the context of pneumonia. She was started on amiodarone. Echocardiogram 6/17 EF 25-30%  Her nausea is much improved  Her biggest problem now is her hip  Echo 11/17 EF 45%  Mild Mod MR  6/17    TSH 1.92 7/17   Alt 56  8/17 Cr 0.8 K4.6 She is doing well on her SSRI.     Past Medical History:  Diagnosis Date  . Anxiety state, unspecified   . Arthritis   . Atrial fibrillation ([place])    a. Per Dr. Olin Pia note, one prior episode of afib.  . Cancer ([place])    basil cell carcinomas removed  . Cataract   . Esophageal reflux   . Hemorrhage of gastrointestinal tract, unspecified    a. occurred on Aleve  . History of peptic ulcer   . LBBB (left bundle branch block)   . Left ventricular systolic dysfunction    hx of mild  . Mitral valve insufficiency and aortic valve insufficiency    a. Remote echo 2010: mild focal basal hypertrophy of septum, EF 50-55%, mid-distal inferoseptal myocardium, grade 1 DD, LBBB, mild AI/MR, PASP mildly increased.  . Paroxysmal supraventricular tachycardia ([place])   . Wears glasses     Past Surgical History:  Procedure Laterality Date  . ABDOMINAL HYSTERECTOMY    . APPENDECTOMY    . BREAST SURGERY     biopsy  . CATARACT EXTRACTION Left   . ESOPHAGOGASTRODUODENOSCOPY    . ESOPHAGOGASTRODUODENOSCOPY (EGD) WITH PROPOFOL N/A 10/10/2015   Procedure: ESOPHAGOGASTRODUODENOSCOPY (EGD) WITH PROPOFOL;  Surgeon: Garlan Fair, MD;  [facility];  Service: Endoscopy;  Laterality: N/A;  . FOOT SURGERY Left    hammer toe and bunion  . KNEE ARTHROSCOPY  2009   left  . OPEN  REDUCTION INTERNAL FIXATION (ORIF) METACARPAL Left 11/05/2013   Procedure: CLOSED REDUCTION/PINNING VS OPEN REDUCTION INTERNAL FIXATION (ORIF) LEFT SMALL  METACARPAL FRACTURE ;  Surgeon: Cammie Sickle., MD;  Location: [facility];  Service: Orthopedics;  Laterality: Left;  . ORIF HUMERUS FRACTURE Left 04/10/2015   Procedure: OPEN REDUCTION INTERNAL FIXATION (ORIF) PROXIMAL HUMERUS FRACTURE;  Surgeon: Marybelle Killings, MD;  Location: [facility];  Service: Orthopedics;  Laterality: Left;  . TONSILLECTOMY    . TUBAL LIGATION      Current Outpatient Prescriptions  Medication Sig Dispense Refill  . amiodarone (PACERONE) 200 MG tablet Take 200 mg by mouth daily.    Marland Kitchen aspirin 81 MG tablet Take 81 mg by mouth daily.    . carvedilol (COREG) 12.5 MG tablet Take 1 tablet (12.5 mg total) by mouth 2 (two) times daily with a meal. 60 tablet 11  . clorazepate (TRANXENE) 7.5 MG tablet Take 7.5 mg by mouth 3 (three) times daily as needed for anxiety.     . docusate sodium (COLACE) 100 MG capsule Take 100-300 mg by mouth daily as needed (constipation).     Marland Kitchen estradiol (ESTRACE) 1 MG tablet Take 0.5 mg by mouth daily.    . fluticasone (FLONASE) 50 MCG/ACT nasal spray Place 2 sprays into both nostrils daily  as needed for allergies.   5  . furosemide (LASIX) 20 MG tablet Take 2 tablets (40 mg total) by mouth daily. 60 tablet 8  . ondansetron (ZOFRAN) 8 MG tablet Take 8 mg by mouth every 8 (eight) hours as needed for nausea or vomiting.   2  . potassium chloride SA (K-DUR,KLOR-CON) 20 MEQ tablet Take 20 mEq by mouth daily.    . Rivaroxaban (XARELTO) 15 MG TABS tablet Take 1 tablet (15 mg total) by mouth daily with supper. 30 tablet 11  . sertraline (ZOLOFT) 50 MG tablet Take 75 mg by mouth daily.    . sodium chloride (OCEAN) 0.65 % SOLN nasal spray Place 2 sprays into both nostrils daily as needed for congestion.     No current facility-administered medications for this visit.     Allergies  Allergen  Reactions  . Aleve [Naproxen Sodium] Other (See Comments)    Gi bleed  . Ambien [Zolpidem] Other (See Comments)    Pt states "made me have crazy thoughts"  . Flexeril [Cyclobenzaprine] Other (See Comments)    Urinary rentention  . Adhesive [Tape] Rash and Other (See Comments)    Skin redness  . Aspirin Other (See Comments)    Told to avoid high doses due to history of stomach ulcer    Review of Systems negative except from HPI and PMH  Physical Exam BP 114/60   Pulse 67   Ht 5\' 6"  (1.676 m)   Wt 131 lb (59.4 kg)   SpO2 97%   BMI 21.14 kg/m  Well developed and well nourished in no acute distress HENT normal E scleral and icterus clear Neck Supple JVP 7-8 yes systolic Clear to ausculation Regular rate and rhythm, no murmurs gallops or rub Soft with active bowel sounds No clubbing cyanosis  Edema Alert and oriented, grossly normal motor and sensory function Skin Warm and Dry  Sinus rhythm at 61 Intervals 19/17/51 Left bundle branch block  Assessment and  Plan  SVT  Left bundle branch block  High risk medication surveillance  Mitral regurgitation Severe>>mild //mod   Cardiomyopathy-Nonischemic  25>>40-45%  Congestive heart failure-chronic-systolic       The patient is holding sinus rhythm on amiodarone. thankfully the nausea Has abated.   Dry mouth has improved off of apixoban.  LV function and MR are both significantly improved. Continue current medications

## 2016-11-08 IMAGING — CR DG SHOULDER 2+V*L*
2 series · 2 of 2 positions shown · non-contrast
Comparison: None.

CLINICAL DATA: Bruising and pain of the left shoulder after after a
fall in the supermarket today.

EXAM:
LEFT SHOULDER - 2+ VIEW

[shoulder grashey]
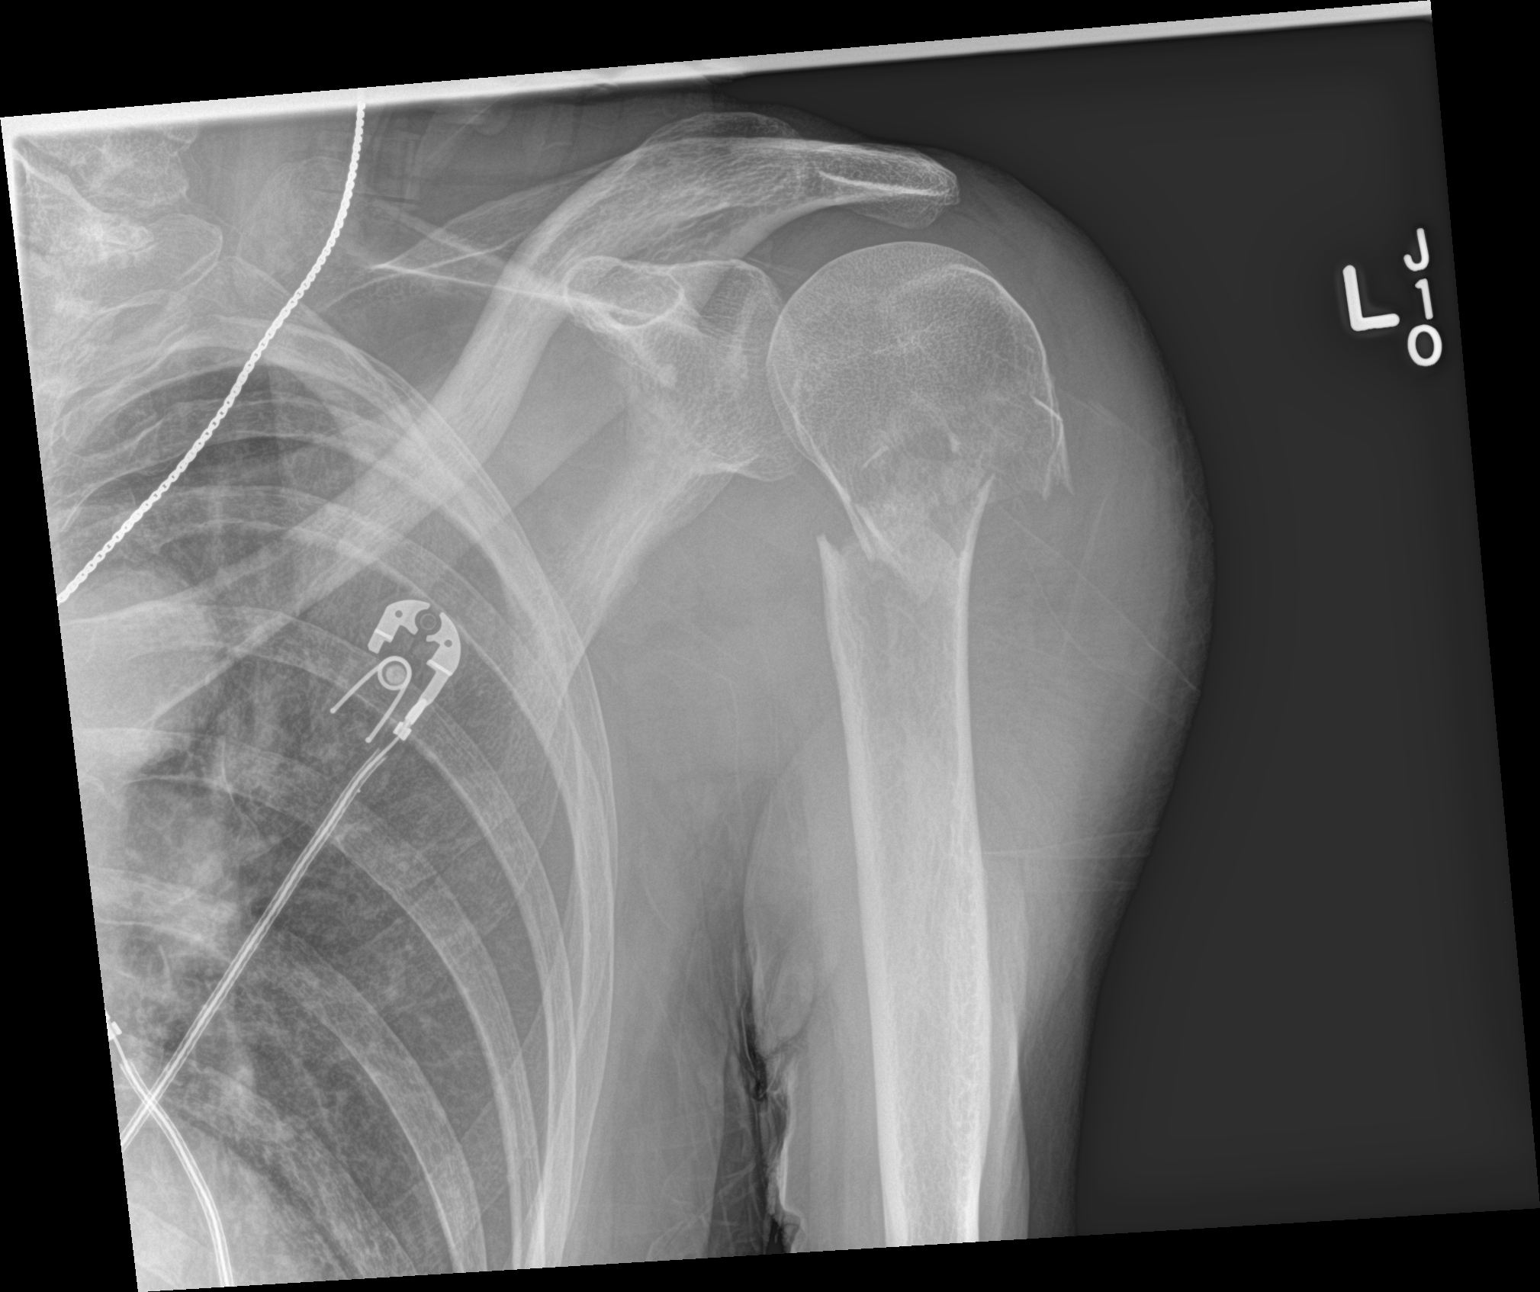

[shoulder y view]
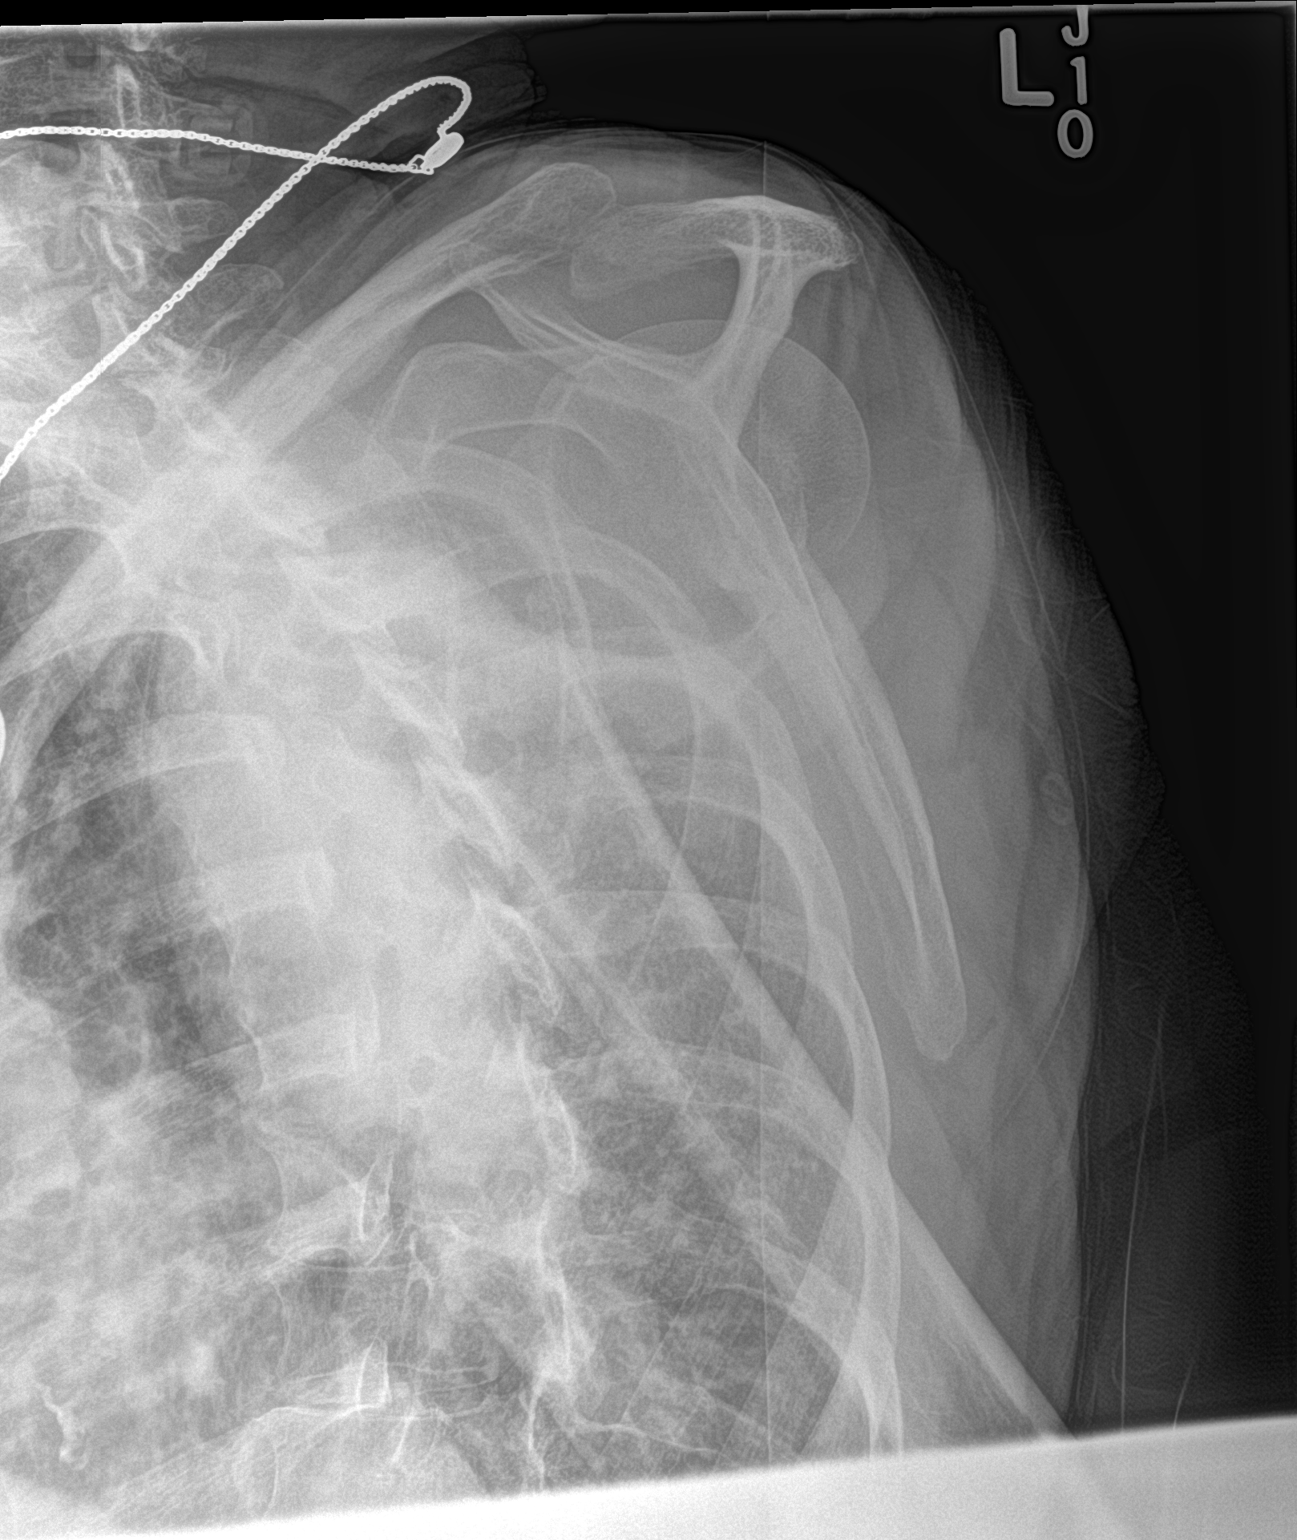

[2 of 2 positions shown; findings below may reference images not displayed]

FINDINGS: There is a comminuted and markedly displaced angulated fracture of
the left humeral neck. The articular surface of the humerus is
intact. No dislocation.
IMPRESSION: Comminuted markedly displaced fracture of the proximal left humerus.

## 2016-12-10 DIAGNOSIS — Z961 Presence of intraocular lens: Secondary | ICD-10-CM | POA: Diagnosis not present

## 2016-12-10 DIAGNOSIS — H524 Presbyopia: Secondary | ICD-10-CM | POA: Diagnosis not present

## 2016-12-17 DIAGNOSIS — M25551 Pain in right hip: Secondary | ICD-10-CM | POA: Diagnosis not present

## 2017-01-15 DIAGNOSIS — I480 Paroxysmal atrial fibrillation: Secondary | ICD-10-CM | POA: Diagnosis not present

## 2017-01-15 DIAGNOSIS — I351 Nonrheumatic aortic (valve) insufficiency: Secondary | ICD-10-CM | POA: Diagnosis not present

## 2017-01-15 DIAGNOSIS — I700 Atherosclerosis of aorta: Secondary | ICD-10-CM | POA: Diagnosis not present

## 2017-01-15 DIAGNOSIS — I471 Supraventricular tachycardia: Secondary | ICD-10-CM | POA: Diagnosis not present

## 2017-01-15 DIAGNOSIS — M8580 Other specified disorders of bone density and structure, unspecified site: Secondary | ICD-10-CM | POA: Diagnosis not present

## 2017-01-15 DIAGNOSIS — Z23 Encounter for immunization: Secondary | ICD-10-CM | POA: Diagnosis not present

## 2017-01-15 DIAGNOSIS — Z0000 Encounter for general adult medical examination without abnormal findings: Secondary | ICD-10-CM | POA: Diagnosis not present

## 2017-01-15 DIAGNOSIS — F325 Major depressive disorder, single episode, in full remission: Secondary | ICD-10-CM | POA: Diagnosis not present

## 2017-01-15 DIAGNOSIS — Z79899 Other long term (current) drug therapy: Secondary | ICD-10-CM | POA: Diagnosis not present

## 2017-01-28 ENCOUNTER — Telehealth: Payer: Self-pay | Admitting: Internal Medicine

## 2017-01-28 NOTE — Telephone Encounter (Signed)
I spoke with the patient. Yvonne [name] states that Yvonne [name] had driven to an appointment yesterday (not with a doctor) and while Yvonne [name] was sitting, Yvonne [name] passed out. This was witnessed by her husband- Yvonne [name] states Yvonne [name] was out "just a short while," but could not given an amount of time. Yvonne [name] states that the only Yvonne [name] felt prior to this was a "funny" feeling in her chest.  Yvonne [name] states Yvonne [name] has felt this before, but never passed out. Yvonne [name] could not really describe the sensation Yvonne [name] was having. Yvonne [name] states that EMS was called and that things seemed to check out ok at the time- Yvonne [name] refused EMS transport to the ER for further evaluation. I have advised her not to drive until I review with Dr. Caryl Comes. Yvonne [name] states Yvonne [name] is the primary driver for her and her husband and that Yvonne [name] did drive today.  I advised her I will call her back after reviewing with Dr. Caryl Comes. Yvonne [name] states Yvonne [name] feels ok today, but just wanted to let Dr. Caryl Comes know.

## 2017-01-28 NOTE — Telephone Encounter (Signed)
Patient calling and requesting a call back. Patient stated that "I just need to talk to her." Thanks.

## 2017-02-12 DIAGNOSIS — J300 Vasomotor rhinitis: Secondary | ICD-10-CM | POA: Diagnosis not present

## 2017-02-17 ENCOUNTER — Telehealth: Payer: Self-pay | Admitting: Internal Medicine

## 2017-02-17 NOTE — Telephone Encounter (Signed)
New Message:   Pt wants to know what she needs do before she go to see the dentist?

## 2017-02-17 NOTE — Telephone Encounter (Signed)
I called and spoke with the patient. She is pending a dental cleaning and possible fillings and wanted to know if she required pre-medication. I advised her she does not require pre-medication for general cleaning/ fillings.

## 2017-03-17 DIAGNOSIS — J020 Streptococcal pharyngitis: Secondary | ICD-10-CM | POA: Diagnosis not present

## 2017-03-17 DIAGNOSIS — R05 Cough: Secondary | ICD-10-CM | POA: Diagnosis not present

## 2017-03-21 DIAGNOSIS — J209 Acute bronchitis, unspecified: Secondary | ICD-10-CM | POA: Diagnosis not present

## 2017-03-28 DIAGNOSIS — N390 Urinary tract infection, site not specified: Secondary | ICD-10-CM | POA: Diagnosis not present

## 2017-04-16 ENCOUNTER — Encounter (HOSPITAL_COMMUNITY): Payer: Self-pay | Admitting: Emergency Medicine

## 2017-04-16 ENCOUNTER — Emergency Department (HOSPITAL_COMMUNITY)
Admission: EM | Admit: 2017-04-16 | Discharge: 2017-04-16 | Disposition: A | Discharge status: Home / Self Care | Payer: PPO | Attending: Emergency Medicine | Admitting: Emergency Medicine

## 2017-04-16 ENCOUNTER — Emergency Department (HOSPITAL_COMMUNITY): Payer: PPO

## 2017-04-16 DIAGNOSIS — Y92002 Bathroom of unspecified non-institutional (private) residence single-family (private) house as the place of occurrence of the external cause: Secondary | ICD-10-CM | POA: Insufficient documentation

## 2017-04-16 DIAGNOSIS — Z85828 Personal history of other malignant neoplasm of skin: Secondary | ICD-10-CM | POA: Insufficient documentation

## 2017-04-16 DIAGNOSIS — Y9301 Activity, walking, marching and hiking: Secondary | ICD-10-CM | POA: Diagnosis not present

## 2017-04-16 DIAGNOSIS — W1839XA Other fall on same level, initial encounter: Secondary | ICD-10-CM | POA: Diagnosis not present

## 2017-04-16 DIAGNOSIS — S0990XA Unspecified injury of head, initial encounter: Secondary | ICD-10-CM | POA: Diagnosis present

## 2017-04-16 DIAGNOSIS — Z79899 Other long term (current) drug therapy: Secondary | ICD-10-CM | POA: Insufficient documentation

## 2017-04-16 DIAGNOSIS — Z7901 Long term (current) use of anticoagulants: Secondary | ICD-10-CM | POA: Diagnosis not present

## 2017-04-16 DIAGNOSIS — S0181XA Laceration without foreign body of other part of head, initial encounter: Secondary | ICD-10-CM | POA: Diagnosis not present

## 2017-04-16 DIAGNOSIS — Y999 Unspecified external cause status: Secondary | ICD-10-CM | POA: Insufficient documentation

## 2017-04-16 DIAGNOSIS — Z23 Encounter for immunization: Secondary | ICD-10-CM | POA: Diagnosis not present

## 2017-04-16 DIAGNOSIS — S0101XA Laceration without foreign body of scalp, initial encounter: Secondary | ICD-10-CM

## 2017-04-16 DIAGNOSIS — W19XXXA Unspecified fall, initial encounter: Secondary | ICD-10-CM

## 2017-04-16 DIAGNOSIS — Z7982 Long term (current) use of aspirin: Secondary | ICD-10-CM | POA: Diagnosis not present

## 2017-04-16 DIAGNOSIS — S098XXA Other specified injuries of head, initial encounter: Secondary | ICD-10-CM | POA: Diagnosis not present

## 2017-04-16 DIAGNOSIS — S199XXA Unspecified injury of neck, initial encounter: Secondary | ICD-10-CM | POA: Diagnosis not present

## 2017-04-16 DIAGNOSIS — I5021 Acute systolic (congestive) heart failure: Secondary | ICD-10-CM | POA: Diagnosis not present

## 2017-04-16 MED ORDER — TETANUS-DIPHTH-ACELL PERTUSSIS 5-2.5-18.5 LF-MCG/0.5 IM SUSP
0.5000 mL | Freq: Once | INTRAMUSCULAR | Status: AC
  Administered 2017-04-16: 0.5 mL via INTRAMUSCULAR
  Filled 2017-04-16: qty 0.5

## 2017-04-16 MED ORDER — ACETAMINOPHEN 500 MG PO TABS
1000.0000 mg | ORAL_TABLET | Freq: Once | ORAL | Status: AC
  Administered 2017-04-16: 1000 mg via ORAL
  Filled 2017-04-16: qty 2

## 2017-04-16 NOTE — ED Provider Notes (Signed)
[facility] DEPT Provider Note   CSN: [id number] Arrival date & time: 04/16/17  1729     History   Chief Complaint Chief Complaint  Patient presents with  . Fall    HPI Yvonne [name] is a 83 y.o. female.  HPI  [name] past medical history significant for A. Fib Yvonne [name]), recurrent falls, who presents to the emergency Department following a fall. States she was walking into her living room and fell backwards hitting her head on a coffee table. Currently complaining of pain to the back of her head. Denies loss of consciousness. Able to ambulate following the fall. Denies preceding headache, change in vision, dizziness, lightheadedness, chest pain, shortness of breath. States she has fallen multiple times over the past year, "I'm always falling", states she always falls backwards. She refuses to walk with a walker or a cane. Denies neck or back pain.  Past Medical History:  Diagnosis Date  . Anxiety state, unspecified   . Arthritis   . Atrial fibrillation (Yvonne [name])    a. Per Dr. Olin [name] note, one prior episode of afib.  . Cancer ([place])    basil cell carcinomas removed  . Cataract   . Esophageal reflux   . Hemorrhage of gastrointestinal tract, unspecified    a. occurred on Aleve  . History of peptic ulcer   . LBBB (left bundle branch block)   . Left ventricular systolic dysfunction    hx of mild  . Mitral valve insufficiency and aortic valve insufficiency    a. Remote echo 2010: mild focal basal hypertrophy of septum, EF 50-55%, mid-distal inferoseptal myocardium, grade 1 DD, LBBB, mild AI/MR, PASP mildly increased.  . Paroxysmal supraventricular tachycardia ([place])   . Wears glasses     Patient Active Problem List   Diagnosis Date Noted  . Hypokalemia 04/29/2016  . NICM (nonischemic cardiomyopathy) ([place]) 04/29/2016  . LBBB (left bundle branch block) 04/29/2016  . Atrial fibrillation with RVR ([place]) 04/25/2016  . Acute systolic congestive heart  failure, NYHA class 3 ([place]) 04/25/2016  . Bilateral pleural effusion 04/25/2016  . Acute respiratory failure with hypoxia ([place]) 04/25/2016  . Positive D dimer 04/25/2016  . Protein calorie malnutrition ([place]) 04/25/2016  . Stage 1 decubitus ulcer/LOW THORACIC spine 04/25/2016  . Physical deconditioning 04/25/2016  . Fracture, humerus, proximal 04/10/2015  . DYSURIA 10/08/2010  . MITRAL REGURGITATION 04/13/2009  . LEFT BUNDLE BRANCH BLOCK 04/13/2009  . GERD 04/13/2009  . Palpitations 04/13/2009  . PEPTIC ULCER DISEASE, HX OF 04/13/2009  . SVT/ PSVT/ PAT 03/26/2009    Past Surgical History:  Procedure Laterality Date  . ABDOMINAL HYSTERECTOMY    . APPENDECTOMY    . BREAST SURGERY     biopsy  . CATARACT EXTRACTION Left   . ESOPHAGOGASTRODUODENOSCOPY    . ESOPHAGOGASTRODUODENOSCOPY (EGD) WITH PROPOFOL N/A 10/10/2015   Procedure: ESOPHAGOGASTRODUODENOSCOPY (EGD) WITH PROPOFOL;  Surgeon: Garlan Fair, MD;  Location: [facility];  Service: Endoscopy;  Laterality: N/A;  . FOOT SURGERY Left    hammer toe and bunion  . KNEE ARTHROSCOPY  2009   left  . OPEN REDUCTION INTERNAL FIXATION (ORIF) METACARPAL Left 11/05/2013   Procedure: CLOSED REDUCTION/PINNING VS OPEN REDUCTION INTERNAL FIXATION (ORIF) LEFT SMALL  METACARPAL FRACTURE ;  Surgeon: Cammie Sickle., MD;  Location: [facility];  Service: Orthopedics;  Laterality: Left;  . ORIF HUMERUS FRACTURE Left 04/10/2015   Procedure: OPEN REDUCTION INTERNAL FIXATION (ORIF) PROXIMAL HUMERUS FRACTURE;  Surgeon: Marybelle Killings,  MD;  Location: [facility];  Service: Orthopedics;  Laterality: Left;  . TONSILLECTOMY    . TUBAL LIGATION      OB History    No data available       Home Medications    Prior to Admission medications   Medication Sig Start Date End Date Taking? Authorizing Provider  amiodarone (PACERONE) 200 MG tablet Take 200 mg by mouth daily.    [provider]  aspirin 81 MG tablet Take 81 mg by mouth  daily.    [provider]  carvedilol (COREG) 12.5 MG tablet Take 1 tablet (12.5 mg total) by mouth 2 (two) times daily with a meal. 06/24/16   Deboraha Sprang, MD  clorazepate (TRANXENE) 7.5 MG tablet Take 7.5 mg by mouth 3 (three) times daily as needed for anxiety.     [provider]  docusate sodium (COLACE) 100 MG capsule Take 100-300 mg by mouth daily as needed (constipation).     [provider]  estradiol (ESTRACE) 1 MG tablet Take 0.5 mg by mouth daily.    [provider]  fluticasone (FLONASE) 50 MCG/ACT nasal spray Place 2 sprays into both nostrils daily as needed for allergies.  02/25/15   [provider]  furosemide (LASIX) 20 MG tablet Take 2 tablets (40 mg total) by mouth daily. 08/21/16   Deboraha Sprang, MD  ondansetron (ZOFRAN) 8 MG tablet Take 8 mg by mouth every 8 (eight) hours as needed for nausea or vomiting.  04/04/16   [provider]  potassium chloride SA (K-DUR,KLOR-CON) 20 MEQ tablet Take 20 mEq by mouth daily.    [provider]  Rivaroxaban (XARELTO) 15 MG TABS tablet Take 1 tablet (15 mg total) by mouth daily with supper. 06/13/16   Deboraha Sprang, MD  sertraline (ZOLOFT) 50 MG tablet Take 75 mg by mouth daily.    [provider]  sodium chloride (OCEAN) 0.65 % SOLN nasal spray Place 2 sprays into both nostrils daily as needed for congestion.    [provider]    Family History Family History  Problem Relation Age of Onset  . Hypertension Mother   . Heart failure Father   . Stroke Sister   . Heart attack Neg Hx     Social History Social History  Substance Use Topics  . Smoking status: Never Smoker  . Smokeless tobacco: Never Used  . Alcohol use No     Allergies   Aleve [naproxen sodium]; Ambien [zolpidem]; Flexeril [cyclobenzaprine]; Adhesive [tape]; and Aspirin   Review of Systems Review of Systems  Constitutional: Negative for appetite change and fever.  HENT: Negative  for dental problem.   Eyes: Negative for visual disturbance.  Respiratory: Negative for chest tightness and shortness of breath.   Cardiovascular: Negative for chest pain and palpitations.  Gastrointestinal: Negative for abdominal pain, diarrhea and vomiting.  Genitourinary: Negative for dysuria and urgency.  Musculoskeletal: Positive for gait problem. Negative for back pain.  Skin: Positive for wound.  Neurological: Positive for headaches. Negative for dizziness, seizures, syncope, weakness and light-headedness.  Psychiatric/Behavioral: Negative for behavioral problems.     Physical Exam Updated Vital Signs BP (!) 144/64   Pulse (!) 55   Temp 98.5 F (36.9 C) (Oral)   Resp 17   Ht 5' 6.5" (1.689 m)   Wt 61.2 kg (135 lb)   SpO2 92%   BMI 21.46 kg/m   Physical Exam  Constitutional: She is oriented to person, place, and  time. She appears well-developed and well-nourished.  HENT:  Mouth/Throat: Oropharynx is clear and moist.  1 cm laceration to R occipital scalp, hemostatic. No palpable skull fracture.  Eyes: Conjunctivae and EOM are normal. Pupils are equal, round, and reactive to light.  Neck: Normal range of motion. Neck supple.  Cardiovascular: Normal rate, regular rhythm, normal heart sounds and intact distal pulses.   No murmur heard. Pulmonary/Chest: Effort normal and breath sounds normal. No respiratory distress.  Abdominal: She exhibits no distension. There is no tenderness. There is no guarding.  Musculoskeletal: Normal range of motion. She exhibits no edema.  No midline cervical, thoracic, lumbar tenderness to palpation.  Neurological: She is alert and oriented to person, place, and time.  Skin: Skin is warm. No pallor.  Psychiatric: She has a normal mood and affect.     ED Treatments / Results  Labs (all labs ordered are listed, but only abnormal results are displayed) Labs Reviewed - No data to display  EKG  EKG Interpretation  Date/Time:  Wednesday April 16 2017 17:30:52 EDT Ventricular Rate:  57 PR Interval:    QRS Duration: 175 QT Interval:  517 QTC Calculation: 504 R Axis:   -81 Text Interpretation:  Sinus rhythm Left bundle branch block No significant change since last tracing Confirmed by Addison Lank [phone]) on 04/16/2017 5:45:44 PM       Radiology Ct Head Wo Contrast  Result Date: 04/16/2017 CLINICAL DATA:  Fall, RIGHT occipital scalp laceration, on anti coagulation therapy with history of atrial fibrillation EXAM: CT HEAD WITHOUT CONTRAST CT CERVICAL SPINE WITHOUT CONTRAST TECHNIQUE: Multidetector CT imaging of the head and cervical spine was performed following the standard protocol without intravenous contrast. Multiplanar CT image reconstructions of the cervical spine were also generated. COMPARISON:  04/06/2015 FINDINGS: CT HEAD FINDINGS Brain: Generalized atrophy. Normal ventricular morphology. No midline shift or mass effect. Small vessel chronic ischemic changes of deep cerebral white matter. No intracranial hemorrhage, mass lesion, evidence of acute infarction, or extra-axial fluid collection. Vascular: Atherosclerotic calcification of internal carotid arteries at skullbase Skull: Intact.  RIGHT occipital scalp laceration noted. Sinuses/Orbits: Opacification of RIGHT sphenoid sinus. Mucosal retention cyst RIGHT maxillary sinus. Other: N/A CT CERVICAL SPINE FINDINGS Alignment: Minimal anterolisthesis at C3-C4 and retrolisthesis at C4-C5, little changed. Remaining alignments normal. Skull base and vertebrae: Vertebral body heights maintained without fracture or bone destruction. Scattered facet degenerative changes bilaterally. Visualized skullbase intact. Soft tissues and spinal canal: Vertebral soft tissues normal thickness. Atherosclerotic calcifications of the carotid bifurcations. Disc levels: Scattered disc space narrowing. Question minimal disc protrusion centrally at C4-C5. Upper chest: Lung apices clear Other: N/A IMPRESSION:  Atrophy with small vessel chronic ischemic changes of deep cerebral white matter. No acute intracranial abnormalities. Scattered degenerative disc and facet disease changes of the cervical spine. Question small central disc protrusion at C4-C5. No acute osseous abnormalities. Carotid atherosclerotic disease. Electronically Signed   By: Lavonia Dana M.D.   On: 04/16/2017 18:21   Ct Cervical Spine Wo Contrast  Result Date: 04/16/2017 CLINICAL DATA:  Fall, RIGHT occipital scalp laceration, on anti coagulation therapy with history of atrial fibrillation EXAM: CT HEAD WITHOUT CONTRAST CT CERVICAL SPINE WITHOUT CONTRAST TECHNIQUE: Multidetector CT imaging of the head and cervical spine was performed following the standard protocol without intravenous contrast. Multiplanar CT image reconstructions of the cervical spine were also generated. COMPARISON:  04/06/2015 FINDINGS: CT HEAD FINDINGS Brain: Generalized atrophy. Normal ventricular morphology. No midline shift or mass effect. Small vessel chronic ischemic changes  of deep cerebral white matter. No intracranial hemorrhage, mass lesion, evidence of acute infarction, or extra-axial fluid collection. Vascular: Atherosclerotic calcification of internal carotid arteries at skullbase Skull: Intact.  RIGHT occipital scalp laceration noted. Sinuses/Orbits: Opacification of RIGHT sphenoid sinus. Mucosal retention cyst RIGHT maxillary sinus. Other: N/A CT CERVICAL SPINE FINDINGS Alignment: Minimal anterolisthesis at C3-C4 and retrolisthesis at C4-C5, little changed. Remaining alignments normal. Skull base and vertebrae: Vertebral body heights maintained without fracture or bone destruction. Scattered facet degenerative changes bilaterally. Visualized skullbase intact. Soft tissues and spinal canal: Vertebral soft tissues normal thickness. Atherosclerotic calcifications of the carotid bifurcations. Disc levels: Scattered disc space narrowing. Question minimal disc protrusion  centrally at C4-C5. Upper chest: Lung apices clear Other: N/A IMPRESSION: Atrophy with small vessel chronic ischemic changes of deep cerebral white matter. No acute intracranial abnormalities. Scattered degenerative disc and facet disease changes of the cervical spine. Question small central disc protrusion at C4-C5. No acute osseous abnormalities. Carotid atherosclerotic disease. Electronically Signed   By: Lavonia Dana M.D.   On: 04/16/2017 18:21    Procedures .Marland KitchenLaceration Repair Date/Time: 04/16/2017 6:40 PM Performed by: Nathaniel Man Authorized by: Fatima Blank   Consent:    Consent obtained:  Verbal   Consent given by:  Patient   Risks discussed:  Pain   Alternatives discussed:  No treatment Laceration details:    Location:  Scalp   Scalp location:  R parietal   Length (cm):  1 Repair type:    Repair type:  Simple Pre-procedure details:    Preparation:  Patient was prepped and draped in usual sterile fashion Exploration:    Contaminated: no   Treatment:    Area cleansed with:  Saline   Amount of cleaning:  Standard   Irrigation solution:  Sterile water   Irrigation method:  Pressure wash Skin repair:    Repair method:  Staples   Number of staples:  3 Approximation:    Approximation:  Close   [place] border: well-aligned   Post-procedure details:    Dressing:  Antibiotic ointment   Patient tolerance of procedure:  Tolerated well, no immediate complications   (including critical care time)  Medications Ordered in ED Medications  Tdap (BOOSTRIX) injection 0.5 mL (not administered)     Initial Impression / Assessment and Plan / ED Course  I have reviewed the triage vital signs and the nursing notes.  Pertinent labs & imaging results that were available during my care of the patient were reviewed by me and considered in my medical decision making (see chart for details).     83 year old female past medical history significant for recurrent falls, A.  fib on anticoagulation, who presents to the emergency Department following a fall. Scalp laceration, hemostatic on arrival. ABCs intact. GCS 15. No loss of consciousness. No preceding symptoms. Currently complaining of a headache.  CT head and C-spine obtained - showed no acute findings. Patient's wound was irrigated and washed out at bedside, laceration was repaired, see procedure note above for details.   Patient stable for discharge home. Discussed at length that she needs to use the walker that she has at home. Discussed follow-up for staple removal. Discussed Tylenol for pain control. Patient expressed understanding, no questions or concerns at time of discharge.  Final Clinical Impressions(s) / ED Diagnoses   Final diagnoses:  Fall, initial encounter  Laceration of scalp, initial encounter    New Prescriptions New Prescriptions   No medications on file     Nathaniel Man,  MD 04/16/17 1843

## 2017-04-16 NOTE — ED Provider Notes (Addendum)
I have personally seen and examined the patient. I have reviewed the documentation on PMH/FH/Soc Hx. I have discussed the plan of care with the resident and patient.  I have reviewed and agree with the resident's documentation. Please see associated encounter note.   EKG Interpretation  Date/Time:  Wednesday April 16 2017 17:30:52 EDT Ventricular Rate:  57 PR Interval:    QRS Duration: 175 QT Interval:  517 QTC Calculation: 504 R Axis:   -81 Text Interpretation:  Sinus rhythm Left bundle branch block No significant change since last tracing Confirmed by Addison Lank [phone]) on 04/16/2017 5:45:44 PM         I have personally seen and examined the patient.  I have discussed plan of care with resident.  I was present for key and critical portions of procedure as documented. I have reviewed the appropriate documentation on PMH/FH/Soc. History.  I have reviewed the documentation of the resident and agree.     Fatima Blank, MD 04/16/17 [phone]

## 2017-04-16 NOTE — ED Notes (Signed)
Family at bedside. 

## 2017-04-16 NOTE — ED Notes (Signed)
Pt transported to CT ?

## 2017-04-16 NOTE — ED Notes (Signed)
MD at bedside to place staples.

## 2017-04-16 NOTE — ED Triage Notes (Signed)
Pt arrives from home by [facility], pt ambulated into the ems bay with ems. Pt states she lost her balance in her bathroom at home and hit the back of her head, pt has a laceration on the back of her head. Pt is alert and 0x4, denies LOC.

## 2017-04-23 DIAGNOSIS — Z4802 Encounter for removal of sutures: Secondary | ICD-10-CM | POA: Diagnosis not present

## 2017-04-23 DIAGNOSIS — I5022 Chronic systolic (congestive) heart failure: Secondary | ICD-10-CM | POA: Diagnosis not present

## 2017-05-06 ENCOUNTER — Other Ambulatory Visit: Payer: Self-pay | Admitting: Internal Medicine

## 2017-05-20 ENCOUNTER — Other Ambulatory Visit: Payer: Self-pay | Admitting: Internal Medicine

## 2017-06-17 ENCOUNTER — Other Ambulatory Visit: Payer: Self-pay | Admitting: Internal Medicine

## 2017-06-19 ENCOUNTER — Telehealth: Payer: Self-pay | Admitting: *Deleted

## 2017-06-19 NOTE — Telephone Encounter (Signed)
Spoke with patient over phone, she is in donut hole and is unable to afford XARELTO at $140.00/month. I will mail her patient assistance form from [employer] and [place]. She will return to office when complete.

## 2017-06-24 ENCOUNTER — Other Ambulatory Visit: Payer: Self-pay | Admitting: Internal Medicine

## 2017-07-15 DIAGNOSIS — M25562 Pain in left knee: Secondary | ICD-10-CM | POA: Diagnosis not present

## 2017-07-16 ENCOUNTER — Telehealth: Payer: Self-pay | Admitting: Internal Medicine

## 2017-07-16 NOTE — Telephone Encounter (Signed)
Attempted to contact pt, received message wireless customer not available at this time, unable to leave a message.

## 2017-07-16 NOTE — Telephone Encounter (Signed)
Yvonne [name] is calling because she is wanting to speak with you about her blood thinner medication . Please call at [phone]

## 2017-07-18 ENCOUNTER — Telehealth: Payer: Self-pay | Admitting: Internal Medicine

## 2017-07-18 NOTE — Telephone Encounter (Signed)
New Message  Pt call returning to speak with RN about previous telephone note on 9/19 about blood thinner. Please call back to discuss

## 2017-07-18 NOTE — Telephone Encounter (Signed)
Left message to call back if she still has questions.

## 2017-07-18 NOTE — Telephone Encounter (Signed)
I spoke with the patient. She states she is in the donut hole for xarelto and is asking for samples. Samples pulled for:  Xarelto 15 mg Lot: [id number] Exp: 12/20 # 4 bottles  Lot: [id number] Exp: 1/20 #1 bottle  Patient aware this will be placed at the front desk for pick up.

## 2017-07-22 DIAGNOSIS — I5022 Chronic systolic (congestive) heart failure: Secondary | ICD-10-CM | POA: Diagnosis not present

## 2017-07-22 DIAGNOSIS — I480 Paroxysmal atrial fibrillation: Secondary | ICD-10-CM | POA: Diagnosis not present

## 2017-07-22 DIAGNOSIS — Z79899 Other long term (current) drug therapy: Secondary | ICD-10-CM | POA: Diagnosis not present

## 2017-07-22 DIAGNOSIS — R269 Unspecified abnormalities of gait and mobility: Secondary | ICD-10-CM | POA: Diagnosis not present

## 2017-07-29 DIAGNOSIS — G44201 Tension-type headache, unspecified, intractable: Secondary | ICD-10-CM | POA: Diagnosis not present

## 2017-08-02 ENCOUNTER — Other Ambulatory Visit: Payer: Self-pay | Admitting: Internal Medicine

## 2017-08-04 DIAGNOSIS — Z23 Encounter for immunization: Secondary | ICD-10-CM | POA: Diagnosis not present

## 2017-08-18 ENCOUNTER — Telehealth: Payer: Self-pay | Admitting: Internal Medicine

## 2017-08-18 NOTE — Telephone Encounter (Signed)
Call from patient who states "because she is in the donut hole Nira Conn promised to provide Xeralto 15 mg samples for remainder of the year."  Explained to patient due to limited number of samples we are unable to provide samples for the remainder of the year. Xeralto 15 mg , 28 days supply left for patient who verbalized understanding this would be the last month we are able to provide samples. Samples left a front desk for patient pickup this week.  Georgana Curio MHA RN CCM

## 2017-08-18 NOTE — Telephone Encounter (Signed)
New message    Pt is calling asking for a call back. She said it's about her medication, she just needs to talk to someone about it.

## 2017-08-21 ENCOUNTER — Other Ambulatory Visit: Payer: Self-pay | Admitting: Otolaryngology

## 2017-08-21 DIAGNOSIS — J329 Chronic sinusitis, unspecified: Secondary | ICD-10-CM

## 2017-08-25 ENCOUNTER — Ambulatory Visit
Admission: RE | Admit: 2017-08-25 | Discharge: 2017-08-25 | Disposition: A | Discharge status: Home / Self Care | Payer: PPO | Source: Ambulatory Visit | Attending: Otolaryngology | Admitting: Otolaryngology

## 2017-08-25 DIAGNOSIS — D485 Neoplasm of uncertain behavior of skin: Secondary | ICD-10-CM | POA: Diagnosis not present

## 2017-08-25 DIAGNOSIS — D1801 Hemangioma of skin and subcutaneous tissue: Secondary | ICD-10-CM | POA: Diagnosis not present

## 2017-08-25 DIAGNOSIS — L570 Actinic keratosis: Secondary | ICD-10-CM | POA: Diagnosis not present

## 2017-08-25 DIAGNOSIS — L820 Inflamed seborrheic keratosis: Secondary | ICD-10-CM | POA: Diagnosis not present

## 2017-08-25 DIAGNOSIS — R51 Headache: Secondary | ICD-10-CM | POA: Diagnosis not present

## 2017-08-25 DIAGNOSIS — Z85828 Personal history of other malignant neoplasm of skin: Secondary | ICD-10-CM | POA: Diagnosis not present

## 2017-08-25 DIAGNOSIS — D692 Other nonthrombocytopenic purpura: Secondary | ICD-10-CM | POA: Diagnosis not present

## 2017-08-25 DIAGNOSIS — J329 Chronic sinusitis, unspecified: Secondary | ICD-10-CM

## 2017-08-25 DIAGNOSIS — L821 Other seborrheic keratosis: Secondary | ICD-10-CM | POA: Diagnosis not present

## 2017-08-25 DIAGNOSIS — C44519 Basal cell carcinoma of skin of other part of trunk: Secondary | ICD-10-CM | POA: Diagnosis not present

## 2017-08-26 NOTE — Telephone Encounter (Signed)
**Note De-Identified  Obfuscation** The pt mailed her completed pt assistance application to me in the mail. I have completed the providers part, printed a Xarelto RX and Dr Caryl Comes signed both. I have faxed all to [facility] and [facility].

## 2017-09-03 ENCOUNTER — Other Ambulatory Visit: Payer: Self-pay | Admitting: Cardiovascular Disease

## 2017-09-15 DIAGNOSIS — J329 Chronic sinusitis, unspecified: Secondary | ICD-10-CM | POA: Diagnosis not present

## 2017-09-22 ENCOUNTER — Other Ambulatory Visit: Payer: Self-pay | Admitting: Internal Medicine

## 2017-09-22 ENCOUNTER — Telehealth: Payer: Self-pay | Admitting: Internal Medicine

## 2017-09-22 NOTE — Telephone Encounter (Signed)
Returned patients call. Did not get an answer. Will try again later.

## 2017-09-22 NOTE — Telephone Encounter (Signed)
Patient calling the office for samples of medication:   1.  What medication and dosage are you requesting samples for? XARELTO  15mg   2.  Are you currently out of this medication? Pt has 2 more pills left , pt is wanting 5 bottles to last her to December

## 2017-09-23 NOTE — Telephone Encounter (Signed)
**Note De-Identified  Obfuscation** I called Wyn[employer] Emery and [employer] [facility] pt assistance to check the status of the pts application. I s/w Dolores Lory who advised me that the pts out of pocket expense report from her pharmacy was not included with her application and that they must have that to make a determination on an approval or denial for pt assistance.  The pt is aware and states that she will bring that report with her to her OV with Dr Caryl Comes on 09/26/17.

## 2017-09-23 NOTE — Telephone Encounter (Signed)
New message ° ° ° °Pt verbalized that she is returning call  °

## 2017-09-23 NOTE — Telephone Encounter (Signed)
The pt is requesting enough Xarelto to last her until the first of the year.  She is advised that I will leave her 2 bottles of Xarelto samples in the front office that she can pick up at her convenience. She stated "well that isn't going to do Im going to need at least 6 bottles". I advised her that our samples are for new starts only but that we will try to help her as long as she is trying to get pt assistance for her Xarelto. She stated "well I have an appointment with Dr Caryl Comes this Friday and someone will give me my medicine". Again, I advised her that I am leaving her 2 bottles of Xarelto samples in our front office for her to pick up.

## 2017-09-24 NOTE — Telephone Encounter (Signed)
New message     Patient called to let you know she will pick up the 2 bottles when she comes in Friday

## 2017-09-25 NOTE — Telephone Encounter (Signed)
FYI

## 2017-09-26 ENCOUNTER — Encounter (HOSPITAL_COMMUNITY)
Admission: EM | Disposition: A | Discharge status: Home / Self Care | Payer: Self-pay | Source: Home / Self Care | Attending: Cardiology

## 2017-09-26 ENCOUNTER — Ambulatory Visit (HOSPITAL_COMMUNITY): Admit: 2017-09-26 | Payer: PPO | Admitting: Cardiology

## 2017-09-26 ENCOUNTER — Encounter (HOSPITAL_COMMUNITY): Payer: Self-pay | Admitting: Emergency Medicine

## 2017-09-26 ENCOUNTER — Other Ambulatory Visit: Payer: Self-pay

## 2017-09-26 ENCOUNTER — Inpatient Hospital Stay (HOSPITAL_COMMUNITY)
Admission: EM | Admit: 2017-09-26 | Discharge: 2017-09-27 | DRG: 244 | Disposition: A | Discharge status: Home / Self Care | Payer: PPO | Attending: Cardiology | Admitting: Cardiology

## 2017-09-26 ENCOUNTER — Ambulatory Visit: Payer: PPO | Admitting: Internal Medicine

## 2017-09-26 VITALS — BP 120/46 | HR 35 | Wt 134.0 lb

## 2017-09-26 DIAGNOSIS — Z7982 Long term (current) use of aspirin: Secondary | ICD-10-CM | POA: Diagnosis not present

## 2017-09-26 DIAGNOSIS — I5043 Acute on chronic combined systolic (congestive) and diastolic (congestive) heart failure: Secondary | ICD-10-CM

## 2017-09-26 DIAGNOSIS — Z79890 Hormone replacement therapy (postmenopausal): Secondary | ICD-10-CM

## 2017-09-26 DIAGNOSIS — I471 Supraventricular tachycardia, unspecified: Secondary | ICD-10-CM | POA: Diagnosis present

## 2017-09-26 DIAGNOSIS — T501X5A Adverse effect of loop [high-ceiling] diuretics, initial encounter: Secondary | ICD-10-CM | POA: Diagnosis present

## 2017-09-26 DIAGNOSIS — Z95818 Presence of other cardiac implants and grafts: Secondary | ICD-10-CM

## 2017-09-26 DIAGNOSIS — I480 Paroxysmal atrial fibrillation: Secondary | ICD-10-CM

## 2017-09-26 DIAGNOSIS — Z8711 Personal history of peptic ulcer disease: Secondary | ICD-10-CM

## 2017-09-26 DIAGNOSIS — I447 Left bundle-branch block, unspecified: Secondary | ICD-10-CM | POA: Diagnosis not present

## 2017-09-26 DIAGNOSIS — Z91048 Other nonmedicinal substance allergy status: Secondary | ICD-10-CM | POA: Diagnosis not present

## 2017-09-26 DIAGNOSIS — I499 Cardiac arrhythmia, unspecified: Secondary | ICD-10-CM | POA: Diagnosis not present

## 2017-09-26 DIAGNOSIS — Z888 Allergy status to other drugs, medicaments and biological substances status: Secondary | ICD-10-CM

## 2017-09-26 DIAGNOSIS — Z7901 Long term (current) use of anticoagulants: Secondary | ICD-10-CM | POA: Diagnosis not present

## 2017-09-26 DIAGNOSIS — E876 Hypokalemia: Secondary | ICD-10-CM | POA: Diagnosis present

## 2017-09-26 DIAGNOSIS — Z950 Presence of cardiac pacemaker: Secondary | ICD-10-CM | POA: Diagnosis not present

## 2017-09-26 DIAGNOSIS — Z79899 Other long term (current) drug therapy: Secondary | ICD-10-CM | POA: Diagnosis not present

## 2017-09-26 DIAGNOSIS — R001 Bradycardia, unspecified: Secondary | ICD-10-CM | POA: Diagnosis not present

## 2017-09-26 DIAGNOSIS — I442 Atrioventricular block, complete: Secondary | ICD-10-CM | POA: Diagnosis not present

## 2017-09-26 DIAGNOSIS — K219 Gastro-esophageal reflux disease without esophagitis: Secondary | ICD-10-CM | POA: Diagnosis not present

## 2017-09-26 DIAGNOSIS — Z8249 Family history of ischemic heart disease and other diseases of the circulatory system: Secondary | ICD-10-CM

## 2017-09-26 DIAGNOSIS — J9811 Atelectasis: Secondary | ICD-10-CM | POA: Diagnosis not present

## 2017-09-26 DIAGNOSIS — R531 Weakness: Secondary | ICD-10-CM | POA: Diagnosis present

## 2017-09-26 HISTORY — PX: PACEMAKER IMPLANT: EP1218

## 2017-09-26 HISTORY — DX: Atrioventricular block, complete: I44.2

## 2017-09-26 HISTORY — DX: Paroxysmal atrial fibrillation: I48.0

## 2017-09-26 LAB — COMPREHENSIVE METABOLIC PANEL
ALBUMIN: 3.9 g/dL (ref 3.5–5.0)
ALT: 18 U/L (ref 14–54)
ANION GAP: 10 (ref 5–15)
AST: 24 U/L (ref 15–41)
Alkaline Phosphatase: 64 U/L (ref 38–126)
BUN: 38 mg/dL — ABNORMAL HIGH (ref 6–20)
CO2: 24 mmol/L (ref 22–32)
Calcium: 9 mg/dL (ref 8.9–10.3)
Chloride: 104 mmol/L (ref 101–111)
Creatinine, Ser: 1.03 mg/dL — ABNORMAL HIGH (ref 0.44–1.00)
GFR calc non Af Amer: 48 mL/min — ABNORMAL LOW (ref >60)
GFR, EST AFRICAN AMERICAN: 55 mL/min — AB (ref >60)
GLUCOSE: 95 mg/dL (ref 65–99)
POTASSIUM: 3.4 mmol/L — AB (ref 3.5–5.1)
SODIUM: 138 mmol/L (ref 135–145)
Total Bilirubin: 0.6 mg/dL (ref 0.3–1.2)
Total Protein: 7.2 g/dL (ref 6.5–8.1)

## 2017-09-26 LAB — CBC WITH DIFFERENTIAL/PLATELET
BASOS PCT: 0 %
Basophils Absolute: 0 10*3/uL (ref 0.0–0.1)
Eosinophils Absolute: 0.1 10*3/uL (ref 0.0–0.7)
Eosinophils Relative: 1 %
HEMATOCRIT: 38.3 % (ref 36.0–46.0)
HEMOGLOBIN: 12.6 g/dL (ref 12.0–15.0)
LYMPHS PCT: 25 %
Lymphs Abs: 2.3 10*3/uL (ref 0.7–4.0)
MCH: 30.6 pg (ref 26.0–34.0)
MCHC: 32.9 g/dL (ref 30.0–36.0)
MCV: 93 fL (ref 78.0–100.0)
MONO ABS: 0.7 10*3/uL (ref 0.1–1.0)
Monocytes Relative: 8 %
NEUTROS ABS: 6.1 10*3/uL (ref 1.7–7.7)
NEUTROS PCT: 66 %
Platelets: 253 10*3/uL (ref 150–400)
RBC: 4.12 MIL/uL (ref 3.87–5.11)
RDW: 14.9 % (ref 11.5–15.5)
WBC: 9.1 10*3/uL (ref 4.0–10.5)

## 2017-09-26 LAB — PROTIME-INR
INR: 2.1
Prothrombin Time: 23.4 seconds — ABNORMAL HIGH (ref 11.4–15.2)

## 2017-09-26 SURGERY — PACEMAKER IMPLANT

## 2017-09-26 MED ORDER — LIDOCAINE HCL (PF) 1 % IJ SOLN
INTRAMUSCULAR | Status: AC
  Filled 2017-09-26: qty 30

## 2017-09-26 MED ORDER — CARVEDILOL 6.25 MG PO TABS
6.2500 mg | ORAL_TABLET | Freq: Two times a day (BID) | ORAL | Status: DC
  Administered 2017-09-27: 6.25 mg via ORAL
  Filled 2017-09-26: qty 1

## 2017-09-26 MED ORDER — ONDANSETRON HCL 4 MG PO TABS: 8.0000 mg | ORAL_TABLET | Freq: Three times a day (TID) | ORAL | Status: DC | PRN

## 2017-09-26 MED ORDER — DOCUSATE SODIUM 100 MG PO CAPS: 100.0000 mg | ORAL_CAPSULE | Freq: Every day | ORAL | Status: DC | PRN

## 2017-09-26 MED ORDER — SODIUM CHLORIDE 0.9 % IR SOLN
80.0000 mg | Status: AC
  Administered 2017-09-26: 80 mg

## 2017-09-26 MED ORDER — CEFAZOLIN SODIUM-DEXTROSE 1-4 GM/50ML-% IV SOLN
1.0000 g | Freq: Four times a day (QID) | INTRAVENOUS | Status: AC
  Administered 2017-09-26 – 2017-09-27 (×3): 1 g via INTRAVENOUS
  Filled 2017-09-26 (×3): qty 50

## 2017-09-26 MED ORDER — POTASSIUM CHLORIDE CRYS ER 20 MEQ PO TBCR
20.0000 meq | EXTENDED_RELEASE_TABLET | Freq: Two times a day (BID) | ORAL | Status: DC
  Administered 2017-09-26 – 2017-09-27 (×2): 20 meq via ORAL
  Filled 2017-09-26 (×2): qty 1

## 2017-09-26 MED ORDER — CHLORHEXIDINE GLUCONATE 4 % EX LIQD
60.0000 mL | Freq: Once | CUTANEOUS | Status: DC
  Filled 2017-09-26: qty 60

## 2017-09-26 MED ORDER — SALINE SPRAY 0.65 % NA SOLN
2.0000 | Freq: Every day | NASAL | Status: DC | PRN
  Filled 2017-09-26: qty 44

## 2017-09-26 MED ORDER — ESTRADIOL 1 MG PO TABS
0.5000 mg | ORAL_TABLET | Freq: Every day | ORAL | Status: DC
  Administered 2017-09-27: 0.5 mg via ORAL
  Filled 2017-09-26: qty 0.5

## 2017-09-26 MED ORDER — ONDANSETRON HCL 4 MG/2ML IJ SOLN: 4.0000 mg | Freq: Four times a day (QID) | INTRAMUSCULAR | Status: DC | PRN

## 2017-09-26 MED ORDER — SODIUM CHLORIDE 0.9 % IR SOLN
Status: AC
  Filled 2017-09-26: qty 2

## 2017-09-26 MED ORDER — FLUTICASONE PROPIONATE 50 MCG/ACT NA SUSP
2.0000 | Freq: Every day | NASAL | Status: DC | PRN
  Filled 2017-09-26: qty 16

## 2017-09-26 MED ORDER — CEFAZOLIN SODIUM-DEXTROSE 2-4 GM/100ML-% IV SOLN
2.0000 g | INTRAVENOUS | Status: AC
  Administered 2017-09-26: 2 g via INTRAVENOUS

## 2017-09-26 MED ORDER — ACETAMINOPHEN 325 MG PO TABS
325.0000 mg | ORAL_TABLET | ORAL | Status: DC | PRN
  Administered 2017-09-26 – 2017-09-27 (×3): 650 mg via ORAL
  Filled 2017-09-26 (×3): qty 2

## 2017-09-26 MED ORDER — LIDOCAINE HCL (PF) 1 % IJ SOLN
INTRAMUSCULAR | Status: DC | PRN
  Administered 2017-09-26: 48 mL

## 2017-09-26 MED ORDER — SODIUM CHLORIDE 0.9 % IV SOLN
INTRAVENOUS | Status: DC
  Administered 2017-09-26: 14:00:00 via INTRAVENOUS

## 2017-09-26 MED ORDER — RIVAROXABAN 15 MG PO TABS
15.0000 mg | ORAL_TABLET | Freq: Every day | ORAL | Status: DC
  Filled 2017-09-26: qty 1

## 2017-09-26 MED ORDER — HEPARIN (PORCINE) IN NACL 2-0.9 UNIT/ML-% IJ SOLN
INTRAMUSCULAR | Status: AC | PRN
  Administered 2017-09-26: 500 mL

## 2017-09-26 MED ORDER — CEFAZOLIN SODIUM-DEXTROSE 2-4 GM/100ML-% IV SOLN
INTRAVENOUS | Status: AC
  Filled 2017-09-26: qty 100

## 2017-09-26 MED ORDER — AMIODARONE HCL 200 MG PO TABS
400.0000 mg | ORAL_TABLET | Freq: Two times a day (BID) | ORAL | Status: DC
  Administered 2017-09-26 – 2017-09-27 (×2): 400 mg via ORAL
  Filled 2017-09-26 (×2): qty 2

## 2017-09-26 MED ORDER — CHLORHEXIDINE GLUCONATE 4 % EX LIQD: 60.0000 mL | Freq: Once | CUTANEOUS | Status: DC

## 2017-09-26 SURGICAL SUPPLY — 7 items
CABLE SURGICAL S-101-97-12 (CABLE) ×6 IMPLANT
LEAD TENDRIL MRI 52CM LPA1200M (Lead) ×3 IMPLANT
LEAD TENDRIL MRI 58CM LPA1200M (Lead) ×3 IMPLANT
PACEMAKER ASSURITY DR-RF (Pacemaker) ×3 IMPLANT
PAD DEFIB LIFELINK (PAD) ×3 IMPLANT
SHEATH CLASSIC 8F (SHEATH) ×6 IMPLANT
TRAY PACEMAKER INSERTION (PACKS) ×3 IMPLANT

## 2017-09-26 NOTE — Progress Notes (Signed)
so      Patient Care Team: Lajean Manes, MD as PCP - General (Internal Medicine)   HPI  Yvonne [name] is a 83 y.o. female Seen in followup for SVT for which she has elected medical therapy.  Echocardiogram 2010 demonstrated mild left ventricular dysfunction. She is chronic left bundle branch block.  She was hospitalized 6/17 for atrial fibrillation with a rapid rate in the context of pneumonia. She was started on amiodarone. Echocardiogram 6/17 EF 25-30%  Her nausea is much improved  She comes in now with episodes of weakness and lightheadedness.     Echo 11/17 EF 45%  Mild Mod MR  6/17    TSH 1.92 7/17   Alt 56  8/17 Cr 0.8 K4.6       Past Medical History:  Diagnosis Date  . Anxiety state, unspecified   . Arthritis   . Atrial fibrillation ([place])    a. Per Dr. Olin Pia note, one prior episode of afib.  . Cancer ([place])    basil cell carcinomas removed  . Cataract   . Esophageal reflux   . Hemorrhage of gastrointestinal tract, unspecified    a. occurred on Aleve  . History of peptic ulcer   . LBBB (left bundle branch block)   . Left ventricular systolic dysfunction    hx of mild  . Mitral valve insufficiency and aortic valve insufficiency    a. Remote echo 2010: mild focal basal hypertrophy of septum, EF 50-55%, mid-distal inferoseptal myocardium, grade 1 DD, LBBB, mild AI/MR, PASP mildly increased.  . Paroxysmal supraventricular tachycardia ([place])   . Wears glasses     Past Surgical History:  Procedure Laterality Date  . ABDOMINAL HYSTERECTOMY    . APPENDECTOMY    . BREAST SURGERY     biopsy  . CATARACT EXTRACTION Left   . ESOPHAGOGASTRODUODENOSCOPY    . ESOPHAGOGASTRODUODENOSCOPY (EGD) WITH PROPOFOL N/A 10/10/2015   Procedure: ESOPHAGOGASTRODUODENOSCOPY (EGD) WITH PROPOFOL;  Surgeon: Garlan Fair, MD;  Location: [facility];  Service: Endoscopy;  Laterality: N/A;  . FOOT SURGERY Left    hammer toe and bunion  . KNEE ARTHROSCOPY  2009   left  . OPEN  REDUCTION INTERNAL FIXATION (ORIF) METACARPAL Left 11/05/2013   Procedure: CLOSED REDUCTION/PINNING VS OPEN REDUCTION INTERNAL FIXATION (ORIF) LEFT SMALL  METACARPAL FRACTURE ;  Surgeon: Cammie Sickle., MD;  Location: [facility];  Service: Orthopedics;  Laterality: Left;  . ORIF HUMERUS FRACTURE Left 04/10/2015   Procedure: OPEN REDUCTION INTERNAL FIXATION (ORIF) PROXIMAL HUMERUS FRACTURE;  Surgeon: Marybelle Killings, MD;  Location: [facility];  Service: Orthopedics;  Laterality: Left;  . TONSILLECTOMY    . TUBAL LIGATION      Current Outpatient Medications  Medication Sig Dispense Refill  . amiodarone (PACERONE) 200 MG tablet TAKE ONE TABLET EACH DAY 30 tablet 0  . aspirin 81 MG tablet Take 81 mg by mouth daily.    . carvedilol (COREG) 12.5 MG tablet TAKE ONE TABLET TWICE DAILY WITH A MEAL 60 tablet 0  . clorazepate (TRANXENE) 7.5 MG tablet Take 7.5 mg by mouth 3 (three) times daily as needed for anxiety.     . docusate sodium (COLACE) 100 MG capsule Take 100-300 mg by mouth daily as needed (constipation).     Marland Kitchen estradiol (ESTRACE) 1 MG tablet Take 0.5 mg by mouth daily.    . fluticasone (FLONASE) 50 MCG/ACT nasal spray Place 2 sprays into both nostrils daily as needed for allergies.  5  . furosemide (LASIX) 20 MG tablet TAKE TWO TABLETS EVERY DAY 60 tablet 3  . ondansetron (ZOFRAN) 8 MG tablet Take 8 mg by mouth every 8 (eight) hours as needed for nausea or vomiting.   2  . potassium chloride SA (K-DUR,KLOR-CON) 20 MEQ tablet Take 20 mEq by mouth daily.    . sertraline (ZOLOFT) 50 MG tablet Take 75 mg by mouth daily.    . sodium chloride (OCEAN) 0.65 % SOLN nasal spray Place 2 sprays into both nostrils daily as needed for congestion.    Alveda Reasons 15 MG TABS tablet TAKE ONE TABLET EACH DAY WITH SUPPER 30 tablet 4   No current facility-administered medications for this visit.     Allergies  Allergen Reactions  . Aleve [Naproxen Sodium] Other (See Comments)    Gi bleed  . Ambien  [Zolpidem] Other (See Comments)    Pt states "made me have crazy thoughts"  . Flexeril [Cyclobenzaprine] Other (See Comments)    Urinary rentention  . Adhesive [Tape] Rash and Other (See Comments)    Skin redness  . Aspirin Other (See Comments)    Told to avoid high doses due to history of stomach ulcer    Review of Systems negative except from HPI and PMH  Physical Exam BP (!) 120/46   Pulse (!) 35   Wt 134 lb (60.8 kg)   SpO2 97%   BMI 21.30 kg/m     Well developed and well nourished in no acute distress HENT normal E scleral and icterus clear Neck Supple JVP 7-8 yes systolic Clear to ausculation Regular rate and rhythm, no murmurs gallops or rub Soft with active bowel sounds No clubbing cyanosis  Edema Alert and oriented, grossly normal motor and sensory function Skin Warm and Dry  Sinus rhythm at 75 with complete heart block Ventricular rate is in the 20s     Assessment and  Plan  SVT  Left bundle branch block complete heart block  High risk medication surveillance  Mitral regurgitation Severe>>mild //mod   Cardiomyopathy-Nonischemic  25>>40-45%  Congestive heart failure-chronic-systolic     She has developed complete heart block in the context of carvedilol and amiodarone therapy.  Unfortunately she has had rapid atrial fibrillation and hence will need pacing for her complete heart block.  The benefits and risks were reviewed including but not limited to death,  perforation, infection, lead dislodgement and device malfunction.  The patient understands agrees and is willing to proceed.  She will be admitted to the hospital.

## 2017-09-26 NOTE — H&P (Signed)
Yvonne [name] is a 83 y.o. female with a history of atrial fibrillation. She requires amiodarone. She presented to clinic with high grade heart block. [name] need a pacemaker today. On exam, bradycardic, no murmurs, lungs clear. Risks and benefits of pacemaker were discussed. Risks include but not limited to bleeding, infection, tamponade, pneumothorax. The patient understands the risks and has agreed to the procedure.  Rishabh Rinkenberger [name], MD 09/26/2017 3:23 PM

## 2017-09-26 NOTE — ED Triage Notes (Signed)
Pt arrives via [facility] from her cardiologist office, ems reports they were called out for pt having HR of 25 bpm. Pt had intermittent runs of vtach on the monitor and states she feels faint each time it occurred. bp 124/70. Pt a/ox4, resp e/u, nad.

## 2017-09-26 NOTE — ED Notes (Signed)
Family at bedside. 

## 2017-09-26 NOTE — Progress Notes (Signed)
Received patient post pacemaker placement.Patient alert and oriented, no complaints of any pain or discomfort. Left chest pacemaker site, dressing CDI. No swelling, no bruising or hematoma noted. Will monitor accordingly.

## 2017-09-26 NOTE — Patient Instructions (Addendum)
Patient admitted by way of EMS.  Follow up to be determined post procedure.   [phone]  Any Other Special Instructions Will Be Listed Below (If Applicable).

## 2017-09-26 NOTE — ED Notes (Signed)
ED Provider at bedside. 

## 2017-09-26 NOTE — ED Notes (Signed)
Pt transported to cath holding, report given to debbie, rn

## 2017-09-26 NOTE — ED Provider Notes (Signed)
[facility] EMERGENCY DEPARTMENT Provider Note   CSN: [id number] Arrival date & time: 09/26/17  1342     History   Chief Complaint Chief Complaint  Patient presents with  . Bradycardia    HPI Yvonne [name] is a 83 y.o. female.  HPI  Patient presents due to weakness and fatigue. Patient had a scheduled cardiology visit today, and although she was feeling more weak than usual, went to the visit. She was found to have bradycardia there, sent here for evaluation. Patient denies chest pain, states that over the past days, possibly weeks that she has had progression of symptoms, with diminished exercise capacity, but no new dyspnea, no new pain. No recent medication changes, and she notes that she takes multiple medications daily, including blood thinning medication.  No history of bradycardia, she does have a history of atrial fibrillation.   Past Medical History:  Diagnosis Date  . Anxiety state, unspecified   . Arthritis   . Atrial fibrillation ([place])    a. Per Dr. Olin Pia note, one prior episode of afib.  . Cancer ([place])    basil cell carcinomas removed  . Cataract   . Esophageal reflux   . Hemorrhage of gastrointestinal tract, unspecified    a. occurred on Aleve  . History of peptic ulcer   . LBBB (left bundle branch block)   . Left ventricular systolic dysfunction    hx of mild  . Mitral valve insufficiency and aortic valve insufficiency    a. Remote echo 2010: mild focal basal hypertrophy of septum, EF 50-55%, mid-distal inferoseptal myocardium, grade 1 DD, LBBB, mild AI/MR, PASP mildly increased.  . Paroxysmal supraventricular tachycardia ([place])   . Wears glasses     Patient Active Problem List   Diagnosis Date Noted  . Hypokalemia 04/29/2016  . NICM (nonischemic cardiomyopathy) ([place]) 04/29/2016  . LBBB (left bundle branch block) 04/29/2016  . Atrial fibrillation with RVR ([place]) 04/25/2016  . Acute systolic congestive heart failure, NYHA  class 3 ([place]) 04/25/2016  . Bilateral pleural effusion 04/25/2016  . Acute respiratory failure with hypoxia ([place]) 04/25/2016  . Positive D dimer 04/25/2016  . Protein calorie malnutrition ([place]) 04/25/2016  . Stage 1 decubitus ulcer/LOW THORACIC spine 04/25/2016  . Physical deconditioning 04/25/2016  . Fracture, humerus, proximal 04/10/2015  . DYSURIA 10/08/2010  . MITRAL REGURGITATION 04/13/2009  . LEFT BUNDLE BRANCH BLOCK 04/13/2009  . GERD 04/13/2009  . Palpitations 04/13/2009  . PEPTIC ULCER DISEASE, HX OF 04/13/2009  . SVT/ PSVT/ PAT 03/26/2009    Past Surgical History:  Procedure Laterality Date  . ABDOMINAL HYSTERECTOMY    . APPENDECTOMY    . BREAST SURGERY     biopsy  . CATARACT EXTRACTION Left   . ESOPHAGOGASTRODUODENOSCOPY    . ESOPHAGOGASTRODUODENOSCOPY (EGD) WITH PROPOFOL N/A 10/10/2015   Procedure: ESOPHAGOGASTRODUODENOSCOPY (EGD) WITH PROPOFOL;  Surgeon: Garlan Fair, MD;  [facility];  Service: Endoscopy;  Laterality: N/A;  . FOOT SURGERY Left    hammer toe and bunion  . KNEE ARTHROSCOPY  2009   left  . OPEN REDUCTION INTERNAL FIXATION (ORIF) METACARPAL Left 11/05/2013   Procedure: CLOSED REDUCTION/PINNING VS OPEN REDUCTION INTERNAL FIXATION (ORIF) LEFT SMALL  METACARPAL FRACTURE ;  Surgeon: Cammie Sickle., MD;  Location: [facility];  Service: Orthopedics;  Laterality: Left;  . ORIF HUMERUS FRACTURE Left 04/10/2015   Procedure: OPEN REDUCTION INTERNAL FIXATION (ORIF) PROXIMAL HUMERUS FRACTURE;  Surgeon: Marybelle Killings, MD;  Location: [facility];  Service: Orthopedics;  Laterality: Left;  . TONSILLECTOMY    . TUBAL LIGATION      OB History    No data available       Home Medications    Prior to Admission medications   Medication Sig Start Date End Date Taking? Authorizing Provider  amiodarone (PACERONE) 200 MG tablet TAKE ONE TABLET EACH DAY 09/03/17   Deboraha Sprang, MD  aspirin 81 MG tablet Take 81 mg by mouth daily.     [provider]  carvedilol (COREG) 12.5 MG tablet TAKE ONE TABLET TWICE DAILY WITH A MEAL 09/22/17   Deboraha Sprang, MD  clorazepate (TRANXENE) 7.5 MG tablet Take 7.5 mg by mouth 3 (three) times daily as needed for anxiety.     [provider]  docusate sodium (COLACE) 100 MG capsule Take 100-300 mg by mouth daily as needed (constipation).     [provider]  estradiol (ESTRACE) 1 MG tablet Take 0.5 mg by mouth daily.    [provider]  fluticasone (FLONASE) 50 MCG/ACT nasal spray Place 2 sprays into both nostrils daily as needed for allergies.  02/25/15   [provider]  furosemide (LASIX) 20 MG tablet TAKE TWO TABLETS EVERY DAY 05/20/17   Deboraha Sprang, MD  ondansetron (ZOFRAN) 8 MG tablet Take 8 mg by mouth every 8 (eight) hours as needed for nausea or vomiting.  04/04/16   [provider]  potassium chloride SA (K-DUR,KLOR-CON) 20 MEQ tablet Take 20 mEq by mouth 2 (two) times daily.     [provider]  sodium chloride (OCEAN) 0.65 % SOLN nasal spray Place 2 sprays into both nostrils daily as needed for congestion.    [provider]  XARELTO 15 MG TABS tablet TAKE ONE TABLET EACH DAY WITH SUPPER 06/17/17   Deboraha Sprang, MD    Family History Family History  Problem Relation Age of Onset  . Hypertension Mother   . Heart failure Father   . Stroke Sister   . Heart attack Neg Hx     Social History Social History   Tobacco Use  . Smoking status: Never Smoker  . Smokeless tobacco: Never Used  Substance Use Topics  . Alcohol use: No  . Drug use: No     Allergies   Aleve [naproxen sodium]; Ambien [zolpidem]; Flexeril [cyclobenzaprine]; Adhesive [tape]; and Aspirin   Review of Systems Review of Systems  Constitutional:       Per HPI, otherwise negative  HENT:       Per HPI, otherwise negative  Respiratory:       Per HPI, otherwise negative  Cardiovascular:       Per HPI, otherwise negative    Gastrointestinal: Negative for vomiting.  Endocrine:       Negative aside from HPI  Genitourinary:       Neg aside from HPI   Musculoskeletal:       Per HPI, otherwise negative  Skin: Negative.   Neurological: Positive for weakness. Negative for syncope.     Physical Exam Updated Vital Signs There were no vitals taken for this visit.  Physical Exam  Constitutional: She is oriented to person, place, and time. She has a sickly appearance. No distress.  HENT:  Head: Normocephalic and atraumatic.  Eyes: Conjunctivae and EOM are normal.  Cardiovascular: Regular rhythm. Bradycardia present.  Pulmonary/Chest: Effort normal and breath sounds normal. No stridor. No respiratory distress.  Abdominal: She exhibits no distension.  Musculoskeletal: She  exhibits no edema.  Neurological: She is alert and oriented to person, place, and time. No cranial nerve deficit.  Skin: Skin is warm and dry.  Psychiatric: She has a normal mood and affect.  Nursing note and vitals reviewed.    ED Treatments / Results  Labs (all labs ordered are listed, but only abnormal results are displayed) Labs Reviewed  COMPREHENSIVE METABOLIC PANEL - Abnormal; Notable for the following components:      Result Value   Potassium 3.4 (*)    BUN 38 (*)    Creatinine, Ser 1.03 (*)    GFR calc non Af Amer 48 (*)    GFR calc Af Amer 55 (*)    All other components within normal limits  PROTIME-INR - Abnormal; Notable for the following components:   Prothrombin Time 23.4 (*)    All other components within normal limits  SURGICAL PCR SCREEN  CBC WITH DIFFERENTIAL/PLATELET    EKG  EKG Interpretation  Date/Time:  Friday September 26 2017 13:47:03 EST Ventricular Rate:  20  Text Interpretation:  Sinus bradycardia Artifact Abnormal ekg Confirmed by Carmin Muskrat [phone]) on 09/26/2017 1:58:05 PM      Multiple rhythm strips consistent with bradycardia, occasional heart block, substantial ectopy.  Heart rate  on monitor persistently low, 20/30.   Procedures Procedures (including critical care time)    Initial Impression / Assessment and Plan / ED Course  I have reviewed the triage vital signs and the nursing notes.  Pertinent labs & imaging results that were available during my care of the patient were reviewed by me and considered in my medical decision making (see chart for details).     Immediately after arrival to the emergency department given concern for bradycardia the patient was placed on the monitoring equipment, had resuscitation pads applied. Patient's cardiac monitor demonstrated persistent bradycardia, with a rate in the 30s. I discussed the patient's case with our cardiology colleagues, at bedside. Given concern for persistent bradycardia, as well as her history of A. fib, we discussed admission for emergent pacemaker placement.   Final Clinical Impressions(s) / ED Diagnoses  Bradycardia  CRITICAL CARE Performed by: Carmin Muskrat Total critical care time: 35 minutes Critical care time was exclusive of separately billable procedures and treating other patients. Critical care was necessary to treat or prevent imminent or life-threatening deterioration. Critical care was time spent personally by me on the following activities: development of treatment plan with patient and/or surrogate as well as nursing, discussions with consultants, evaluation of patient's response to treatment, examination of patient, obtaining history from patient or surrogate, ordering and performing treatments and interventions, ordering and review of laboratory studies, ordering and review of radiographic studies, pulse oximetry and re-evaluation of patient's condition.    Carmin Muskrat, MD 09/26/17 [phone]

## 2017-09-26 NOTE — ED Notes (Signed)
Luetta Nutting, NP with cardiology at bedside

## 2017-09-26 NOTE — ED Notes (Signed)
Pt placed on zoll pads 

## 2017-09-27 ENCOUNTER — Inpatient Hospital Stay (HOSPITAL_COMMUNITY): Payer: PPO

## 2017-09-27 ENCOUNTER — Encounter (HOSPITAL_COMMUNITY): Payer: Self-pay | Admitting: Physician Assistant

## 2017-09-27 ENCOUNTER — Other Ambulatory Visit: Payer: Self-pay | Admitting: Physician Assistant

## 2017-09-27 DIAGNOSIS — I442 Atrioventricular block, complete: Principal | ICD-10-CM

## 2017-09-27 DIAGNOSIS — I480 Paroxysmal atrial fibrillation: Secondary | ICD-10-CM

## 2017-09-27 DIAGNOSIS — E876 Hypokalemia: Secondary | ICD-10-CM

## 2017-09-27 NOTE — Discharge Summary (Signed)
ELECTROPHYSIOLOGY PROCEDURE DISCHARGE SUMMARY    Patient ID: Yvonne [name],  MRN: [id number], DOB/AGE: 06-18-1929 83 y.o.  Admit date: 09/26/2017 Discharge date: 09/27/2017  Primary Care Physician: Lajean Manes, MD Electrophysiologist: Caryl Comes  Primary Discharge Diagnosis:  Symptomatic complete heart block status post pacemaker implantation this admission  Secondary Discharge Diagnosis:  1.  SVT 2.  LBBB 3.  PAF 4.  Hypokalemia  Allergies  Allergen Reactions  . Aleve [Naproxen Sodium] Other (See Comments)    Gi bleed  . Ambien [Zolpidem] Other (See Comments)    Pt states "made me have crazy thoughts"  . Flexeril [Cyclobenzaprine] Other (See Comments)    Urinary rentention  . Adhesive [Tape] Rash and Other (See Comments)    Skin redness  . Aspirin Other (See Comments)    Told to avoid high doses due to history of stomach ulcer     Procedures This Admission:  1.  Implantation of a STJ dual chamber PPM on 09/26/17 by Dr [name].  There were no immediate post procedure complications. 2.  CXR on 09/27/17 demonstrated no pneumothorax status post device implantation.   Brief HPI/Hospital Course Yvonne [name] is a 83 y.o. female with prior history SVT, paroxysmal atrial fib, h/o peptic ulcer, GERD, LBBB, h/o GIB (while taking Aleve) who was admitted with symptomatic bradycardia. She has a h/o SVT for which she's elected medical therapy. She's been trialed on various beta blockers. She also has a history of atrial fib with rapid rate 03/2016 in context of pneumonia, and was started on amiodarone. She presented to the office yesterday with complete heart block with a ventricular rate of 20 and was [facility] by EMS.  She requires medical therapy for control of SVT.  Risks, benefits, and alternatives to PPM implantation were reviewed with the patient who wished to proceed. The patient was admitted and underwent implantation of a STJ dual chamber PPM with details as  outlined above.  She was monitored on telemetry overnight which demonstrated NSR with P synchronous ventricular pacing. Left chest was without hematoma or ecchymosis.  The device was interrogated and found to be functioning normally.  CXR was obtained and demonstrated no pneumothorax status post device implantation.  Wound care, arm mobility, and restrictions were reviewed with the patient.  The patient was examined and considered stable for discharge to home by Dr. Lovena Le. She was also given supplemental potassium due to hypokalemia in the context of chronic Lasix use. Since creatinine clearance is decreased, will continue her on home dose KCl at discharge (last prior OP K was 4.6). Will recommend BMET at time of f/u wound check - sent message to scheduler team. Dr. Lovena Le recommended to continue home doses of Lasix and carvedilol at discharge.   Physical Exam: Vitals:   09/26/17 1932 09/27/17 0545 09/27/17 0829 09/27/17 0936  BP: (!) 105/45 (!) 150/75 (!) 146/58 130/62  Pulse: 71 70 64   Resp: 18 18 18    Temp: 98 F (36.7 C) 98 F (36.7 C) 98 F (36.7 C)   TempSrc: Oral Oral Oral   SpO2: 96% 96% 93% 95%  Weight:  141 lb 3.2 oz (64 kg)    Height:        Physical exam per Dr. Tanna Furry note.  Labs:   Lab Results  Component Value Date   WBC 9.1 09/26/2017   HGB 12.6 09/26/2017   HCT 38.3 09/26/2017   MCV 93.0 09/26/2017   PLT 253 09/26/2017    Recent  Labs  Lab 09/26/17 1400  NA 138  K 3.4*  CL 104  CO2 24  BUN 38*  CREATININE 1.03*  CALCIUM 9.0  PROT 7.2  BILITOT 0.6  ALKPHOS 64  ALT 18  AST 24  GLUCOSE 95    Discharge Medications:  Allergies as of 09/27/2017      Reactions   Aleve [naproxen Sodium] Other (See Comments)   Gi bleed   Ambien [zolpidem] Other (See Comments)   Pt states "made me have crazy thoughts"   Flexeril [cyclobenzaprine] Other (See Comments)   Urinary rentention   Adhesive [tape] Rash, Other (See Comments)   Skin redness   Aspirin Other  (See Comments)   Told to avoid high doses due to history of stomach ulcer      Medication List    TAKE these medications   amiodarone 200 MG tablet Commonly known as:  PACERONE TAKE ONE TABLET EACH DAY   carvedilol 12.5 MG tablet Commonly known as:  COREG TAKE ONE TABLET TWICE DAILY WITH A MEAL   clorazepate 7.5 MG tablet Commonly known as:  TRANXENE Take 7.5 mg by mouth 3 (three) times daily as needed for anxiety.   docusate sodium 100 MG capsule Commonly known as:  COLACE Take 100-300 mg by mouth daily as needed (constipation).   estradiol 1 MG tablet Commonly known as:  ESTRACE Take 0.5 mg by mouth daily.   fluticasone 50 MCG/ACT nasal spray Commonly known as:  FLONASE Place 2 sprays into both nostrils daily as needed for allergies.   furosemide 20 MG tablet Commonly known as:  LASIX TAKE TWO TABLETS EVERY DAY   ondansetron 8 MG tablet Commonly known as:  ZOFRAN Take 8 mg by mouth every 8 (eight) hours as needed for nausea or vomiting.   potassium chloride 10 MEQ tablet Commonly known as:  K-DUR,KLOR-CON Take 10 mEq by mouth 2 (two) times daily.   sodium chloride 0.65 % Soln nasal spray Commonly known as:  OCEAN Place 2 sprays into both nostrils daily as needed for congestion.   XARELTO 15 MG Tabs tablet Generic drug:  Rivaroxaban TAKE ONE TABLET EACH DAY WITH SUPPER       Disposition:  Discharge Instructions    Diet - low sodium heart healthy   Complete by:  As directed    Increase activity slowly   Complete by:  As directed    IMPORTANT: Do not restart your Xarelto until Monday evening (09/29/17).  Please see attached sheet at the end of your After-Visit Summary for instructions on wound care, activity, and bathing.     Follow-up Information    [facility] Follow up.   Specialty:  Cardiology Why:  10/08/17 at 10AM for wound check. Arrive at 9:45am to check in. Contact information: [address], [address] [place]       Deboraha Sprang, MD Follow up.   Specialty:  Cardiology Why:  12/29/2017 at 9:15AM. Arrive 15 minutes early to check in. Contact information: [address] [address] [phone]           Duration of Discharge Encounter: Greater than 30 minutes including physician time.  Signed, Chanetta Marshall, NP 09/27/2017 6:10 AM  Discharge summary updated and addended to reflect current findings. Dayna Dunn PA-C 10:04 AM  EP Attending  Patient seen and examined. Agree with the findings as noted above. See my note as well. She is stable for  DC home. Usual followup.  Mikle Bosworth.D.

## 2017-09-27 NOTE — Discharge Instructions (Signed)
° ° °  Supplemental Discharge Instructions for  Pacemaker Patients  Activity No heavy lifting or vigorous activity with your left/right arm for 6 to 8 weeks.  Do not raise your left/right arm above your head for one week.  Gradually raise your affected arm as drawn below.           __     10/01/17                     10/02/17                  10/03/17                    10/04/17  NO DRIVING for  1 week   ; you may begin driving on  30/1/60   .  WOUND CARE - Keep the wound area clean and dry.  Do not get this area wet for one week. No showers for one week; you may shower on   10/04/17  . - The tape/steri-strips on your wound will fall off; do not pull them off.  No bandage is needed on the site.  DO  NOT apply any creams, oils, or ointments to the wound area. - If you notice any drainage or discharge from the wound, any swelling or bruising at the site, or you develop a fever > 101? F after you are discharged home, call the office at once.  Special Instructions - You are still able to use cellular telephones; use the ear opposite the side where you have your pacemaker/defibrillator.  Avoid carrying your cellular phone near your device. - When traveling through airports, show security personnel your identification card to avoid being screened in the metal detectors.  Ask the security personnel to use the hand wand. - Avoid arc welding equipment, MRI testing (magnetic resonance imaging), TENS units (transcutaneous nerve stimulators).  Call the office for questions about other devices. - Avoid electrical appliances that are in poor condition or are not properly grounded. - Microwave ovens are safe to be near or to operate.

## 2017-09-27 NOTE — Progress Notes (Signed)
Pt is been discharge home per MD order, is alert and stable, denies pain or discomfort. IV and tele d/c.

## 2017-09-27 NOTE — Progress Notes (Signed)
Progress Note  Patient Name: Yvonne [name] Date of Encounter: 09/27/2017  Primary Cardiologist: Dr. Renaldo Reel  Subjective   I feel better. No chest pain or sob.  Inpatient Medications    Scheduled Meds: . amiodarone  400 mg Oral BID  . carvedilol  6.25 mg Oral BID WC  . estradiol  0.5 mg Oral Daily  . potassium chloride SA  20 mEq Oral BID  . Rivaroxaban  15 mg Oral Daily   Continuous Infusions: .  ceFAZolin (ANCEF) IV Stopped (09/27/17 0245)   PRN Meds: acetaminophen, docusate sodium, fluticasone, ondansetron (ZOFRAN) IV, ondansetron, sodium chloride   Vital Signs    Vitals:   09/26/17 1829 09/26/17 1932 09/27/17 0545 09/27/17 0829  BP: 123/85 (!) 105/45 (!) 150/75 (!) 146/58  Pulse: 64 71 70 64  Resp:  18 18 18   Temp:  98 F (36.7 C) 98 F (36.7 C) 98 F (36.7 C)  TempSrc:  Oral Oral Oral  SpO2:  96% 96% 93%  Weight:   141 lb 3.2 oz (64 kg)   Height:        Intake/Output Summary (Last 24 hours) at 09/27/2017 0858 Last data filed at 09/27/2017 0600 Gross per 24 hour  Intake 360 ml  Output 700 ml  Net -340 ml   Filed Weights   09/26/17 1804 09/27/17 0545  Weight: 143 lb 1 oz (64.9 kg) 141 lb 3.2 oz (64 kg)    Telemetry    nsr with p synchronous ventricular pacing - Personally Reviewed  ECG    NSR with p synchronous ventricular pacing - Personally Reviewed  Physical Exam   GEN: No acute distress.   Neck: 6 cm JVD Cardiac: RRR, no murmurs, rubs, or gallops.  Respiratory: Clear to auscultation bilaterally. GI: Soft, nontender, non-distended  MS: No edema; No deformity. Neuro:  Nonfocal  Psych: Normal affect   Labs    Chemistry Recent Labs  Lab 09/26/17 1400  NA 138  K 3.4*  CL 104  CO2 24  GLUCOSE 95  BUN 38*  CREATININE 1.03*  CALCIUM 9.0  PROT 7.2  ALBUMIN 3.9  AST 24  ALT 18  ALKPHOS 64  BILITOT 0.6  GFRNONAA 48*  GFRAA 55*  ANIONGAP 10     Hematology Recent Labs  Lab 09/26/17 1400  WBC 9.1  RBC 4.12  HGB 12.6  HCT  38.3  MCV 93.0  MCH 30.6  MCHC 32.9  RDW 14.9  PLT 253    Cardiac EnzymesNo results for input(s): TROPONINI in the last 168 hours. No results for input(s): TROPIPOC in the last 168 hours.   BNPNo results for input(s): BNP, PROBNP in the last 168 hours.   DDimer No results for input(s): DDIMER in the last 168 hours.   Radiology    Dg Chest 2 View  Result Date: 09/27/2017 CLINICAL DATA:  Pacemaker EXAM: CHEST  2 VIEW COMPARISON:  04/30/2016 FINDINGS: Left pacemaker is in place with leads in the right atrium and right ventricle. No pneumothorax. Cardiomegaly. Left base atelectasis. Right lung is clear. No effusions or acute bony abnormality. IMPRESSION: Cardiomegaly.  Left base atelectasis. Electronically Signed   By: Rolm Baptise M.D.   On: 09/27/2017 07:19    Cardiac Studies   none  Patient Profile     83 y.o. female admitted with symptomatic bradycardia due to 3:1 AV block, s/p PPM  Assessment & Plan    1. Complete heart block - she is doing well s/p PPM insertion. 2.  PPM - her device has been interogated under my direction and is working normally. She will need followup in our device clinic in 1-2 weeks and followup with Dr. Renaldo Reel in 3 months. CXR looks good.  3. Atrial fib - continue amiodarone.  For questions or updates, please contact [place] Please consult [email or web address] for contact info under Cardiology/STEMI.      Signed, Cristopher Peru, MD  09/27/2017, 8:58 AM  Patient ID: Yvonne [name], female   DOB: [birth year], 83 y.o.   MRN: [id number]

## 2017-09-29 ENCOUNTER — Encounter (HOSPITAL_COMMUNITY): Payer: Self-pay | Admitting: Cardiology

## 2017-09-30 NOTE — Addendum Note (Signed)
Addended by: Campbell Riches on: 09/30/2017 07:27 AM   Modules accepted: Orders

## 2017-10-04 ENCOUNTER — Other Ambulatory Visit: Payer: Self-pay | Admitting: Internal Medicine

## 2017-10-08 ENCOUNTER — Ambulatory Visit (INDEPENDENT_AMBULATORY_CARE_PROVIDER_SITE_OTHER): Payer: PPO | Admitting: *Deleted

## 2017-10-08 DIAGNOSIS — I442 Atrioventricular block, complete: Secondary | ICD-10-CM | POA: Diagnosis not present

## 2017-10-08 LAB — CUP PACEART INCLINIC DEVICE CHECK
Brady Statistic RA Percent Paced: 21 %
Date Time Interrogation Session: 20181212105704
Implantable Lead Implant Date: 20181130
Lead Channel Impedance Value: 450 Ohm
Lead Channel Impedance Value: 700 Ohm
Lead Channel Pacing Threshold Amplitude: 1 V
Lead Channel Pacing Threshold Pulse Width: 0.5 ms
Lead Channel Pacing Threshold Pulse Width: 0.5 ms
Lead Channel Setting Sensing Sensitivity: 5 mV
MDC IDC LEAD IMPLANT DT: 20181130
MDC IDC LEAD LOCATION: 753859
MDC IDC LEAD LOCATION: 753860
MDC IDC MSMT BATTERY REMAINING LONGEVITY: 70 mo
MDC IDC MSMT BATTERY VOLTAGE: 3.04 V
MDC IDC MSMT LEADCHNL RA SENSING INTR AMPL: 3.8 mV
MDC IDC MSMT LEADCHNL RV PACING THRESHOLD AMPLITUDE: 0.75 V
MDC IDC PG IMPLANT DT: 20181130
MDC IDC SET LEADCHNL RA PACING AMPLITUDE: 3.5 V
MDC IDC SET LEADCHNL RV PACING AMPLITUDE: 5 V
MDC IDC SET LEADCHNL RV PACING PULSEWIDTH: 0.5 ms
MDC IDC STAT BRADY RV PERCENT PACED: 99.99 %
Pulse Gen Serial Number: 8969488

## 2017-10-08 NOTE — Progress Notes (Signed)
Wound check appointment. Steri-strips removed. Wound without redness or edema. Incision edges approximated, wound well healed. Normal device function. Thresholds, sensing, and impedances consistent with implant measurements. Device programmed at 3.5V for extra safety margin until 3 month visit. Histogram distribution appropriate for patient and level of activity. 45 mode switches (<1% burden)+Xarelto, longest 12 minutes 22 seconds, Peak A rate 269 bpm, Peak V rate 73 bpm. No high ventricular rates noted. Patient educated about wound care, arm mobility, lifting restrictions. ROV with SK 12/29/16.

## 2017-10-27 ENCOUNTER — Other Ambulatory Visit: Payer: Self-pay | Admitting: Internal Medicine

## 2017-11-24 DIAGNOSIS — M25562 Pain in left knee: Secondary | ICD-10-CM | POA: Diagnosis not present

## 2017-11-28 ENCOUNTER — Emergency Department (HOSPITAL_COMMUNITY): Payer: PPO

## 2017-11-28 ENCOUNTER — Other Ambulatory Visit: Payer: Self-pay

## 2017-11-28 ENCOUNTER — Emergency Department (HOSPITAL_COMMUNITY)
Admission: EM | Admit: 2017-11-28 | Discharge: 2017-11-28 | Disposition: A | Discharge status: Home / Self Care | Payer: PPO | Attending: Emergency Medicine | Admitting: Emergency Medicine

## 2017-11-28 DIAGNOSIS — S20219A Contusion of unspecified front wall of thorax, initial encounter: Secondary | ICD-10-CM | POA: Diagnosis not present

## 2017-11-28 DIAGNOSIS — Y929 Unspecified place or not applicable: Secondary | ICD-10-CM | POA: Insufficient documentation

## 2017-11-28 DIAGNOSIS — M549 Dorsalgia, unspecified: Secondary | ICD-10-CM | POA: Diagnosis not present

## 2017-11-28 DIAGNOSIS — Z950 Presence of cardiac pacemaker: Secondary | ICD-10-CM | POA: Diagnosis not present

## 2017-11-28 DIAGNOSIS — Y999 Unspecified external cause status: Secondary | ICD-10-CM | POA: Diagnosis not present

## 2017-11-28 DIAGNOSIS — I480 Paroxysmal atrial fibrillation: Secondary | ICD-10-CM | POA: Insufficient documentation

## 2017-11-28 DIAGNOSIS — I471 Supraventricular tachycardia: Secondary | ICD-10-CM | POA: Diagnosis not present

## 2017-11-28 DIAGNOSIS — S20212A Contusion of left front wall of thorax, initial encounter: Secondary | ICD-10-CM | POA: Insufficient documentation

## 2017-11-28 DIAGNOSIS — Z79899 Other long term (current) drug therapy: Secondary | ICD-10-CM | POA: Insufficient documentation

## 2017-11-28 DIAGNOSIS — S299XXA Unspecified injury of thorax, initial encounter: Secondary | ICD-10-CM | POA: Diagnosis not present

## 2017-11-28 DIAGNOSIS — Z7901 Long term (current) use of anticoagulants: Secondary | ICD-10-CM | POA: Insufficient documentation

## 2017-11-28 DIAGNOSIS — W010XXA Fall on same level from slipping, tripping and stumbling without subsequent striking against object, initial encounter: Secondary | ICD-10-CM | POA: Insufficient documentation

## 2017-11-28 DIAGNOSIS — W19XXXA Unspecified fall, initial encounter: Secondary | ICD-10-CM

## 2017-11-28 DIAGNOSIS — Y9389 Activity, other specified: Secondary | ICD-10-CM | POA: Diagnosis not present

## 2017-11-28 DIAGNOSIS — R0781 Pleurodynia: Secondary | ICD-10-CM | POA: Diagnosis not present

## 2017-11-28 MED ORDER — ACETAMINOPHEN 500 MG PO TABS
1000.0000 mg | ORAL_TABLET | Freq: Once | ORAL | Status: AC
  Administered 2017-11-28: 1000 mg via ORAL
  Filled 2017-11-28: qty 2

## 2017-11-28 NOTE — ED Triage Notes (Signed)
Pt in via [facility], per report pt was riding in with her husband and in transit revealed had fallen earlier today, c/o L rib pain, pt denies LOC, denies hitting, pt takes Coumadin, pt did not have vitals completed by EMS, pt A&O x4

## 2017-11-28 NOTE — Discharge Instructions (Signed)
Take tylenol and ice as needed for pain. Use spirometer as directed.  If you were given medicines take as directed.  If you are on coumadin or contraceptives realize their levels and effectiveness is altered by many different medicines.  If you have any reaction (rash, tongues swelling, other) to the medicines stop taking and see a physician.    If your blood pressure was elevated in the ER make sure you follow up for management with a primary doctor or return for chest pain, shortness of breath or stroke symptoms.  Please follow up as directed and return to the ER or see a physician for new or worsening symptoms.  Thank you. Vitals:   11/28/17 1641  BP: 135/79  Pulse: 72  Resp: 20  Temp: 98.6 F (37 C)  TempSrc: Oral  SpO2: 97%

## 2017-11-28 NOTE — ED Provider Notes (Signed)
[facility] EMERGENCY DEPARTMENT Provider Note   CSN: [id number] Arrival date & time: 11/28/17  1639     History   Chief Complaint Chief Complaint  Patient presents with  . Fall    HPI Yvonne [name] is a 84 y.o. female.  Patient presents after a fall. Patient has history of pacemaker, heart block, SVT and was on her way to see her husband who also fell leaving for her to fall and hit her left ribs. Patient denies syncope or head injury. Patient is on blood thinners. Patient has pain to palpation left ribs. No other injuries except superficial skin tear left elbow.patient feels well otherwise without any new symptoms.      Past Medical History:  Diagnosis Date  . Anxiety state, unspecified   . Arthritis   . Cancer (HCC)    basil cell carcinomas removed  . Cataract   . Complete heart block ([place])    a. s/p St. Jude PPM 09/26/17.  Marland Kitchen Esophageal reflux   . Hemorrhage of gastrointestinal tract, unspecified    a. occurred on Aleve  . History of peptic ulcer   . LBBB (left bundle branch block)   . Left ventricular systolic dysfunction    hx of mild  . Mitral valve insufficiency and aortic valve insufficiency    a. Remote echo 2010: mild focal basal hypertrophy of septum, EF 50-55%, mid-distal inferoseptal myocardium, grade 1 DD, LBBB, mild AI/MR, PASP mildly increased.  Marland Kitchen PAF (paroxysmal atrial fibrillation) ([place])   . Paroxysmal supraventricular tachycardia ([place])   . Wears glasses     Patient Active Problem List   Diagnosis Date Noted  . PAF (paroxysmal atrial fibrillation) ([place]) 09/27/2017  . Complete heart block ([place]) 09/26/2017  . Hypokalemia 04/29/2016  . NICM (nonischemic cardiomyopathy) ([place]) 04/29/2016  . LBBB (left bundle branch block) 04/29/2016  . Atrial fibrillation with RVR ([place]) 04/25/2016  . Acute systolic congestive heart failure, NYHA class 3 ([place]) 04/25/2016  . Bilateral pleural effusion 04/25/2016  . Acute respiratory failure with  hypoxia ([place]) 04/25/2016  . Positive D dimer 04/25/2016  . Protein calorie malnutrition ([place]) 04/25/2016  . Stage 1 decubitus ulcer/LOW THORACIC spine 04/25/2016  . Physical deconditioning 04/25/2016  . Fracture, humerus, proximal 04/10/2015  . DYSURIA 10/08/2010  . MITRAL REGURGITATION 04/13/2009  . LEFT BUNDLE BRANCH BLOCK 04/13/2009  . GERD 04/13/2009  . Palpitations 04/13/2009  . PEPTIC ULCER DISEASE, HX OF 04/13/2009  . SVT/ PSVT/ PAT 03/26/2009    Past Surgical History:  Procedure Laterality Date  . ABDOMINAL HYSTERECTOMY    . APPENDECTOMY    . BREAST SURGERY     biopsy  . CATARACT EXTRACTION Left   . ESOPHAGOGASTRODUODENOSCOPY    . ESOPHAGOGASTRODUODENOSCOPY (EGD) WITH PROPOFOL N/A 10/10/2015   Procedure: ESOPHAGOGASTRODUODENOSCOPY (EGD) WITH PROPOFOL;  Surgeon: Garlan Fair, MD;  Location: [facility];  Service: Endoscopy;  Laterality: N/A;  . FOOT SURGERY Left    hammer toe and bunion  . KNEE ARTHROSCOPY  2009   left  . OPEN REDUCTION INTERNAL FIXATION (ORIF) METACARPAL Left 11/05/2013   Procedure: CLOSED REDUCTION/PINNING VS OPEN REDUCTION INTERNAL FIXATION (ORIF) LEFT SMALL  METACARPAL FRACTURE ;  Surgeon: Cammie Sickle., MD;  Location: [facility];  Service: Orthopedics;  Laterality: Left;  . ORIF HUMERUS FRACTURE Left 04/10/2015   Procedure: OPEN REDUCTION INTERNAL FIXATION (ORIF) PROXIMAL HUMERUS FRACTURE;  Surgeon: Marybelle Killings, MD;  Location: [facility];  Service: Orthopedics;  Laterality: Left;  .  PACEMAKER IMPLANT N/A 09/26/2017   Procedure: PACEMAKER IMPLANT;  Surgeon: Constance Haw, MD;  Location: [facility];  Service: Cardiovascular;  Laterality: N/A;  . TONSILLECTOMY    . TUBAL LIGATION      OB History    No data available       Home Medications    Prior to Admission medications   Medication Sig Start Date End Date Taking? Authorizing Provider  amiodarone (PACERONE) 200 MG tablet TAKE ONE TABLET DAILY 10/07/17    Deboraha Sprang, MD  carvedilol (COREG) 12.5 MG tablet TAKE ONE TABLET TWICE DAILY WITH A MEAL 10/27/17   Deboraha Sprang, MD  clorazepate (TRANXENE) 7.5 MG tablet Take 7.5 mg by mouth 3 (three) times daily as needed for anxiety.     [provider]  docusate sodium (COLACE) 100 MG capsule Take 100-300 mg by mouth daily as needed (constipation).     [provider]  estradiol (ESTRACE) 1 MG tablet Take 0.5 mg by mouth daily.    [provider]  fluticasone (FLONASE) 50 MCG/ACT nasal spray Place 2 sprays into both nostrils daily as needed for allergies.  02/25/15   [provider]  furosemide (LASIX) 20 MG tablet TAKE TWO TABLETS EVERY DAY 05/20/17   Deboraha Sprang, MD  ondansetron (ZOFRAN) 8 MG tablet Take 8 mg by mouth every 8 (eight) hours as needed for nausea or vomiting.    [provider]  potassium chloride (K-DUR,KLOR-CON) 10 MEQ tablet Take 10 mEq by mouth 2 (two) times daily.    [provider]  sodium chloride (OCEAN) 0.65 % SOLN nasal spray Place 2 sprays into both nostrils daily as needed for congestion.    [provider]  XARELTO 15 MG TABS tablet TAKE ONE TABLET EACH DAY WITH SUPPER 06/17/17   Deboraha Sprang, MD    Family History Family History  Problem Relation Age of Onset  . Hypertension Mother   . Heart failure Father   . Stroke Sister   . Heart attack Neg Hx     Social History Social History   Tobacco Use  . Smoking status: Never Smoker  . Smokeless tobacco: Never Used  Substance Use Topics  . Alcohol use: No  . Drug use: No     Allergies   Aleve [naproxen sodium]; Ambien [zolpidem]; Flexeril [cyclobenzaprine]; Adhesive [tape]; and Aspirin   Review of Systems Review of Systems  Constitutional: Negative for chills and fever.  HENT: Negative for congestion.   Respiratory: Negative for shortness of breath.   Cardiovascular: Negative for chest pain.  Gastrointestinal: Negative for abdominal pain  and vomiting.  Genitourinary: Negative for dysuria and flank pain.  Musculoskeletal: Positive for back pain. Negative for neck pain and neck stiffness.  Skin: Negative for rash.  Neurological: Negative for light-headedness and headaches.     Physical Exam Updated Vital Signs BP 135/79 (BP Location: Right Arm)   Pulse 72   Temp 98.6 F (37 C) (Oral)   Resp 20   SpO2 97%   Physical Exam  Constitutional: She is oriented to person, place, and time. She appears well-developed and well-nourished.  HENT:  Head: Normocephalic and atraumatic.  Eyes: Conjunctivae are normal. Right eye exhibits no discharge. Left eye exhibits no discharge.  Neck: Normal range of motion. Neck supple. No tracheal deviation present.  Cardiovascular: Normal rate.  Pulmonary/Chest: Effort normal and breath sounds normal.  Abdominal: Soft. She exhibits no distension. There is no tenderness. There is  no guarding.  Musculoskeletal: She exhibits tenderness. She exhibits no edema.  Patient is mild tenderness left lateral lower ribs no defect appreciated. No midline spine tenderness.  Neurological: She is alert and oriented to person, place, and time. No cranial nerve deficit.  Skin: Skin is warm. Rash (superficialskin tear left elbow no bony tenderness.) noted.  Psychiatric: She has a normal mood and affect.  Nursing note and vitals reviewed.    ED Treatments / Results  Labs (all labs ordered are listed, but only abnormal results are displayed) Labs Reviewed - No data to display  EKG  EKG Interpretation None       Radiology Dg Chest 2 View  Result Date: 11/28/2017 CLINICAL DATA:  Fall.  Left rib pain EXAM: CHEST  2 VIEW COMPARISON:  09/27/2017 FINDINGS: Left pacer remains in place, unchanged. Cardiomegaly. Left base atelectasis or scarring. No effusions. Right lung clear. No visible acute bony abnormality. No visible rib fracture. IMPRESSION: Cardiomegaly.  Left base scarring or atelectasis.  Electronically Signed   By: Rolm Baptise M.D.   On: 11/28/2017 18:34    Procedures Procedures (including critical care time)  Medications Ordered in ED Medications  acetaminophen (TYLENOL) tablet 1,000 mg (1,000 mg Oral Given 11/28/17 1917)     Initial Impression / Assessment and Plan / ED Course  I have reviewed the triage vital signs and the nursing notes.  Pertinent labs & imaging results that were available during my care of the patient were reviewed by me and considered in my medical decision making (see chart for details).    Patient presents after low risk fall, ambulating in the ER well-appearing otherwise. Chest x-ray and pain medicines ordered. Discussed common returns supportive care. CXR no fx seen.  Spirometry for home.   Results and differential diagnosis were discussed with the patient/parent/guardian. Xrays were independently reviewed by myself.  Close follow up outpatient was discussed, comfortable with the plan.   Medications  acetaminophen (TYLENOL) tablet 1,000 mg (1,000 mg Oral Given 11/28/17 1917)    Vitals:   11/28/17 1641  BP: 135/79  Pulse: 72  Resp: 20  Temp: 98.6 F (37 C)  TempSrc: Oral  SpO2: 97%    Final diagnoses:  Fall, initial encounter  Rib contusion, left, initial encounter     Final Clinical Impressions(s) / ED Diagnoses   Final diagnoses:  Fall, initial encounter  Rib contusion, left, initial encounter    ED Discharge Orders    None       Elnora Morrison, MD 11/28/17 1924

## 2017-12-11 DIAGNOSIS — R296 Repeated falls: Secondary | ICD-10-CM | POA: Diagnosis not present

## 2017-12-11 DIAGNOSIS — R2681 Unsteadiness on feet: Secondary | ICD-10-CM | POA: Diagnosis not present

## 2017-12-11 DIAGNOSIS — M545 Low back pain: Secondary | ICD-10-CM | POA: Diagnosis not present

## 2017-12-11 DIAGNOSIS — M6281 Muscle weakness (generalized): Secondary | ICD-10-CM | POA: Diagnosis not present

## 2017-12-15 ENCOUNTER — Other Ambulatory Visit: Payer: Self-pay | Admitting: Internal Medicine

## 2017-12-15 DIAGNOSIS — M6281 Muscle weakness (generalized): Secondary | ICD-10-CM | POA: Diagnosis not present

## 2017-12-15 DIAGNOSIS — R2681 Unsteadiness on feet: Secondary | ICD-10-CM | POA: Diagnosis not present

## 2017-12-15 DIAGNOSIS — M545 Low back pain: Secondary | ICD-10-CM | POA: Diagnosis not present

## 2017-12-15 DIAGNOSIS — R296 Repeated falls: Secondary | ICD-10-CM | POA: Diagnosis not present

## 2017-12-16 DIAGNOSIS — M6281 Muscle weakness (generalized): Secondary | ICD-10-CM | POA: Diagnosis not present

## 2017-12-16 DIAGNOSIS — M545 Low back pain: Secondary | ICD-10-CM | POA: Diagnosis not present

## 2017-12-16 DIAGNOSIS — R2681 Unsteadiness on feet: Secondary | ICD-10-CM | POA: Diagnosis not present

## 2017-12-16 DIAGNOSIS — R296 Repeated falls: Secondary | ICD-10-CM | POA: Diagnosis not present

## 2017-12-22 DIAGNOSIS — M6281 Muscle weakness (generalized): Secondary | ICD-10-CM | POA: Diagnosis not present

## 2017-12-22 DIAGNOSIS — M545 Low back pain: Secondary | ICD-10-CM | POA: Diagnosis not present

## 2017-12-22 DIAGNOSIS — R296 Repeated falls: Secondary | ICD-10-CM | POA: Diagnosis not present

## 2017-12-22 DIAGNOSIS — R2681 Unsteadiness on feet: Secondary | ICD-10-CM | POA: Diagnosis not present

## 2017-12-24 DIAGNOSIS — G3184 Mild cognitive impairment, so stated: Secondary | ICD-10-CM | POA: Diagnosis not present

## 2017-12-24 DIAGNOSIS — R110 Nausea: Secondary | ICD-10-CM | POA: Diagnosis not present

## 2017-12-24 DIAGNOSIS — R296 Repeated falls: Secondary | ICD-10-CM | POA: Diagnosis not present

## 2017-12-24 DIAGNOSIS — I4891 Unspecified atrial fibrillation: Secondary | ICD-10-CM | POA: Diagnosis not present

## 2017-12-24 DIAGNOSIS — I10 Essential (primary) hypertension: Secondary | ICD-10-CM | POA: Diagnosis not present

## 2017-12-24 DIAGNOSIS — I440 Atrioventricular block, first degree: Secondary | ICD-10-CM | POA: Diagnosis not present

## 2017-12-24 DIAGNOSIS — F3289 Other specified depressive episodes: Secondary | ICD-10-CM | POA: Diagnosis not present

## 2017-12-24 DIAGNOSIS — I5089 Other heart failure: Secondary | ICD-10-CM | POA: Diagnosis not present

## 2017-12-29 ENCOUNTER — Encounter: Payer: PPO | Admitting: Internal Medicine

## 2017-12-30 ENCOUNTER — Emergency Department (HOSPITAL_COMMUNITY)
Admission: EM | Admit: 2017-12-30 | Discharge: 2017-12-30 | Disposition: A | Discharge status: Home / Self Care | Payer: PPO | Attending: Emergency Medicine | Admitting: Emergency Medicine

## 2017-12-30 ENCOUNTER — Emergency Department (HOSPITAL_COMMUNITY): Payer: PPO

## 2017-12-30 ENCOUNTER — Other Ambulatory Visit: Payer: Self-pay

## 2017-12-30 DIAGNOSIS — W0110XA Fall on same level from slipping, tripping and stumbling with subsequent striking against unspecified object, initial encounter: Secondary | ICD-10-CM | POA: Diagnosis not present

## 2017-12-30 DIAGNOSIS — S0003XA Contusion of scalp, initial encounter: Secondary | ICD-10-CM | POA: Diagnosis not present

## 2017-12-30 DIAGNOSIS — Y929 Unspecified place or not applicable: Secondary | ICD-10-CM | POA: Insufficient documentation

## 2017-12-30 DIAGNOSIS — Z79899 Other long term (current) drug therapy: Secondary | ICD-10-CM | POA: Insufficient documentation

## 2017-12-30 DIAGNOSIS — M545 Low back pain: Secondary | ICD-10-CM | POA: Diagnosis not present

## 2017-12-30 DIAGNOSIS — Y999 Unspecified external cause status: Secondary | ICD-10-CM | POA: Diagnosis not present

## 2017-12-30 DIAGNOSIS — R296 Repeated falls: Secondary | ICD-10-CM | POA: Diagnosis not present

## 2017-12-30 DIAGNOSIS — S0990XA Unspecified injury of head, initial encounter: Secondary | ICD-10-CM

## 2017-12-30 DIAGNOSIS — S199XXA Unspecified injury of neck, initial encounter: Secondary | ICD-10-CM | POA: Diagnosis not present

## 2017-12-30 DIAGNOSIS — Y9389 Activity, other specified: Secondary | ICD-10-CM | POA: Insufficient documentation

## 2017-12-30 DIAGNOSIS — M6281 Muscle weakness (generalized): Secondary | ICD-10-CM | POA: Diagnosis not present

## 2017-12-30 DIAGNOSIS — Z950 Presence of cardiac pacemaker: Secondary | ICD-10-CM | POA: Diagnosis not present

## 2017-12-30 DIAGNOSIS — R2681 Unsteadiness on feet: Secondary | ICD-10-CM | POA: Diagnosis not present

## 2017-12-30 LAB — CBC WITH DIFFERENTIAL/PLATELET
BASOS ABS: 0 10*3/uL (ref 0.0–0.1)
Basophils Relative: 0 %
EOS PCT: 1 %
Eosinophils Absolute: 0 10*3/uL (ref 0.0–0.7)
HCT: 39.1 % (ref 36.0–46.0)
Hemoglobin: 12.7 g/dL (ref 12.0–15.0)
LYMPHS PCT: 20 %
Lymphs Abs: 1.6 10*3/uL (ref 0.7–4.0)
MCH: 30.2 pg (ref 26.0–34.0)
MCHC: 32.5 g/dL (ref 30.0–36.0)
MCV: 93.1 fL (ref 78.0–100.0)
MONO ABS: 0.4 10*3/uL (ref 0.1–1.0)
MONOS PCT: 5 %
Neutro Abs: 6.1 10*3/uL (ref 1.7–7.7)
Neutrophils Relative %: 74 %
PLATELETS: 282 10*3/uL (ref 150–400)
RBC: 4.2 MIL/uL (ref 3.87–5.11)
RDW: 15.1 % (ref 11.5–15.5)
WBC: 8.2 10*3/uL (ref 4.0–10.5)

## 2017-12-30 LAB — BASIC METABOLIC PANEL
ANION GAP: 10 (ref 5–15)
BUN: 29 mg/dL — AB (ref 6–20)
CALCIUM: 9.2 mg/dL (ref 8.9–10.3)
CO2: 26 mmol/L (ref 22–32)
CREATININE: 1.01 mg/dL — AB (ref 0.44–1.00)
Chloride: 103 mmol/L (ref 101–111)
GFR calc Af Amer: 57 mL/min — ABNORMAL LOW (ref >60)
GFR, EST NON AFRICAN AMERICAN: 49 mL/min — AB (ref >60)
GLUCOSE: 100 mg/dL — AB (ref 65–99)
Potassium: 3.8 mmol/L (ref 3.5–5.1)
Sodium: 139 mmol/L (ref 135–145)

## 2017-12-30 NOTE — ED Notes (Signed)
Pt verbalized understanding of discharge instructions and denies any further questions at this time.   Family present at discharge.

## 2017-12-30 NOTE — Discharge Instructions (Signed)
Ice pack to the back of the head several times a day.  Tylenol as needed for pain.  Please follow-up with family doctor as needed.  Return if worsening headache, nausea, vomiting, confusion, any new concerning symptoms

## 2017-12-30 NOTE — ED Provider Notes (Signed)
Patient placed in Quick Look pathway, seen and evaluated   Chief Complaint: fall  HPI:   Pt fell backwards after having her eyeglasses ajusted. Denies dizziness or lightheadiness. Not sure why she fell. Pt is on xerelto. Complaining of a headache. No LOC. No CP, SOB, palpitations. Recently pacemaker placed. Hx of falls  ROS: Positive for headache. Negative for LOC, dizziness, palpitations, CP, any joint or back/neck pain  Physical Exam:   Gen: No distress  Neuro: Awake and Alert  Skin: Warm    Focused Exam: Laceration to posterior scalp. PERRLA. No cervical spine tenderness. 5/5 and equal bilateral upper and lower extremities. AAOx3.    Initiation of care has begun. The patient has been counseled on the process, plan, and necessity for staying for the completion/evaluation, and the remainder of the medical screening examination  Pt with a fall, on xarelto, head injury. Will get CT head and cervical spine. Pt feels well otherwise, however not sure why she fell. No LOC. Remembers the fall. Will get basic labs and ecg.    Jeannett Senior, PA-C 12/30/17 1315    Gareth Morgan, MD 01/01/18 1301

## 2017-12-30 NOTE — ED Notes (Signed)
ED Provider at bedside. 

## 2017-12-30 NOTE — ED Provider Notes (Signed)
[facility] EMERGENCY DEPARTMENT Provider Note   CSN: [id number] Arrival date & time: 12/30/17  1306     History   Chief Complaint Chief Complaint  Patient presents with  . Head Injury    HPI Yvonne [name] is a 84 y.o. female.  HPI  Yvonne [name] is a 84 y.o. female presents to emergency department with complaint of a fall.  Patient with anxiety, frequent, he is arrhythmias, now on Xarelto, recent pacemaker placement presents to emergency department after she fell while opening a car door.  She states she does saw an ophthalmologist and had her glasses adjusted.  She states she was opening the door and fell backwards hitting her head.  No loss of consciousness.  No dizziness or lightheadedness prior to fall.  No palpitations or chest pain.  No shortness of breath.  History of frequent falls in the past.  States she was able to drive the car home after the fall.  Denies any amnesia, confusion, memory problems, no nausea or vomiting.  No changes in vision.  No numbness or weakness in extremities.  No other injuries   Past Medical History:  Diagnosis Date  . Anxiety state, unspecified   . Arthritis   . Cancer (HCC)    basil cell carcinomas removed  . Cataract   . Complete heart block ([place])    a. s/p St. Jude PPM 09/26/17.  Marland Kitchen Esophageal reflux   . Hemorrhage of gastrointestinal tract, unspecified    a. occurred on Aleve  . History of peptic ulcer   . LBBB (left bundle branch block)   . Left ventricular systolic dysfunction    hx of mild  . Mitral valve insufficiency and aortic valve insufficiency    a. Remote echo 2010: mild focal basal hypertrophy of septum, EF 50-55%, mid-distal inferoseptal myocardium, grade 1 DD, LBBB, mild AI/MR, PASP mildly increased.  Marland Kitchen PAF (paroxysmal atrial fibrillation) ([place])   . Paroxysmal supraventricular tachycardia ([place])   . Wears glasses     Patient Active Problem List   Diagnosis Date Noted  . PAF (paroxysmal atrial  fibrillation) ([place]) 09/27/2017  . Complete heart block ([place]) 09/26/2017  . Hypokalemia 04/29/2016  . NICM (nonischemic cardiomyopathy) ([place]) 04/29/2016  . LBBB (left bundle branch block) 04/29/2016  . Atrial fibrillation with RVR ([place]) 04/25/2016  . Acute systolic congestive heart failure, NYHA class 3 ([place]) 04/25/2016  . Bilateral pleural effusion 04/25/2016  . Acute respiratory failure with hypoxia ([place]) 04/25/2016  . Positive D dimer 04/25/2016  . Protein calorie malnutrition ([place]) 04/25/2016  . Stage 1 decubitus ulcer/LOW THORACIC spine 04/25/2016  . Physical deconditioning 04/25/2016  . Fracture, humerus, proximal 04/10/2015  . DYSURIA 10/08/2010  . MITRAL REGURGITATION 04/13/2009  . LEFT BUNDLE BRANCH BLOCK 04/13/2009  . GERD 04/13/2009  . Palpitations 04/13/2009  . PEPTIC ULCER DISEASE, HX OF 04/13/2009  . SVT/ PSVT/ PAT 03/26/2009    Past Surgical History:  Procedure Laterality Date  . ABDOMINAL HYSTERECTOMY    . APPENDECTOMY    . BREAST SURGERY     biopsy  . CATARACT EXTRACTION Left   . ESOPHAGOGASTRODUODENOSCOPY    . ESOPHAGOGASTRODUODENOSCOPY (EGD) WITH PROPOFOL N/A 10/10/2015   Procedure: ESOPHAGOGASTRODUODENOSCOPY (EGD) WITH PROPOFOL;  Surgeon: Garlan Fair, MD;  Location: [facility];  Service: Endoscopy;  Laterality: N/A;  . FOOT SURGERY Left    hammer toe and bunion  . KNEE ARTHROSCOPY  2009   left  . OPEN REDUCTION INTERNAL FIXATION (ORIF) METACARPAL Left  11/05/2013   Procedure: CLOSED REDUCTION/PINNING VS OPEN REDUCTION INTERNAL FIXATION (ORIF) LEFT SMALL  METACARPAL FRACTURE ;  Surgeon: Cammie Sickle., MD;  Location: [facility];  Service: Orthopedics;  Laterality: Left;  . ORIF HUMERUS FRACTURE Left 04/10/2015   Procedure: OPEN REDUCTION INTERNAL FIXATION (ORIF) PROXIMAL HUMERUS FRACTURE;  Surgeon: Marybelle Killings, MD;  Location: [facility];  Service: Orthopedics;  Laterality: Left;  . PACEMAKER IMPLANT N/A 09/26/2017   Procedure: PACEMAKER  IMPLANT;  Surgeon: Constance Haw, MD;  Location: [facility];  Service: Cardiovascular;  Laterality: N/A;  . TONSILLECTOMY    . TUBAL LIGATION      OB History    No data available       Home Medications    Prior to Admission medications   Medication Sig Start Date End Date Taking? Authorizing Provider  acetaminophen (TYLENOL) 500 MG tablet Take 500-1,000 mg by mouth every 6 (six) hours as needed (for pain).   Yes [provider]  amiodarone (PACERONE) 200 MG tablet TAKE ONE TABLET DAILY Patient taking differently: Take 200 mg by mouth in the morning 10/07/17  Yes Deboraha Sprang, MD  buPROPion (WELLBUTRIN XL) 150 MG 24 hr tablet Take 150 mg by mouth daily. 12/09/17  Yes [provider]  carvedilol (COREG) 12.5 MG tablet TAKE ONE TABLET TWICE DAILY WITH A MEAL Patient taking differently: Take 12.5 mg by mouth two times a day with food 10/27/17  Yes Deboraha Sprang, MD  clorazepate (TRANXENE) 7.5 MG tablet Take 7.5 mg by mouth 3 (three) times daily as needed for anxiety.    Yes [provider]  docusate sodium (COLACE) 100 MG capsule Take 100-300 mg by mouth daily as needed (constipation).    Yes [provider]  estradiol (ESTRACE) 0.5 MG tablet Take 0.5 mg by mouth daily.   Yes [provider]  fluticasone (FLONASE) 50 MCG/ACT nasal spray Place 2 sprays into both nostrils daily as needed for allergies.  02/25/15  Yes [provider]  furosemide (LASIX) 20 MG tablet TAKE TWO TABLETS EVERY DAY Patient taking differently: Take 40 mg by mouth once a day 12/15/17  Yes Deboraha Sprang, MD  ondansetron (ZOFRAN) 8 MG tablet Take 8 mg by mouth every 8 (eight) hours as needed for nausea or vomiting.   Yes [provider]  potassium chloride (K-DUR) 10 MEQ tablet Take 10 mEq by mouth 2 (two) times daily. 11/04/17  Yes [provider]  sodium chloride (OCEAN) 0.65 % SOLN nasal spray Place 2 sprays into both nostrils  daily as needed for congestion.   Yes [provider]  XARELTO 15 MG TABS tablet TAKE ONE TABLET EACH DAY WITH SUPPER Patient taking differently: Take 15 mg by mouth once a day with supper 06/17/17  Yes Deboraha Sprang, MD    Family History Family History  Problem Relation Age of Onset  . Hypertension Mother   . Heart failure Father   . Stroke Sister   . Heart attack Neg Hx     Social History Social History   Tobacco Use  . Smoking status: Never Smoker  . Smokeless tobacco: Never Used  Substance Use Topics  . Alcohol use: No  . Drug use: No     Allergies   Aleve [naproxen sodium]; Ambien [zolpidem]; Flexeril [cyclobenzaprine]; Adhesive [tape]; and Aspirin   Review of Systems Review of Systems  Constitutional: Negative for chills and fever.  Respiratory: Negative for cough, chest  tightness and shortness of breath.   Cardiovascular: Negative for chest pain, palpitations and leg swelling.  Gastrointestinal: Negative for abdominal pain, diarrhea, nausea and vomiting.  Genitourinary: Negative for dysuria, flank pain and pelvic pain.  Musculoskeletal: Negative for arthralgias, myalgias, neck pain and neck stiffness.  Skin: Positive for wound. Negative for rash.  Neurological: Positive for headaches. Negative for dizziness and weakness.  All other systems reviewed and are negative.    Physical Exam Updated Vital Signs Ht 5\' 6"  (1.676 m)   Wt 62.6 kg (138 lb)   BMI 22.27 kg/m   Physical Exam  Constitutional: She is oriented to person, place, and time. She appears well-developed and well-nourished. No distress.  HENT:  Head: Normocephalic.  Large hematoma to the left parietal scalp, with superficial abrasion.  No hemotympanum bilaterally.  Eyes: Conjunctivae are normal.  Neck: Neck supple.  No midline tenderness  Cardiovascular: Normal rate, regular rhythm and normal heart sounds.  Pulmonary/Chest: Effort normal and breath sounds normal. No respiratory  distress. She has no wheezes. She has no rales.  Abdominal: Soft. Bowel sounds are normal. She exhibits no distension. There is no tenderness. There is no rebound.  Musculoskeletal: She exhibits no edema.  No midline thoracic or lumbar spine tenderness  Neurological: She is alert and oriented to person, place, and time.  5/5 and equal upper and lower extremity strength bilaterally. Equal grip strength bilaterally. Normal finger to nose and heel to shin. No pronator drift.   Skin: Skin is warm and dry.  Psychiatric: She has a normal mood and affect. Her behavior is normal.  Nursing note and vitals reviewed.    ED Treatments / Results  Labs (all labs ordered are listed, but only abnormal results are displayed) Labs Reviewed  BASIC METABOLIC PANEL - Abnormal; Notable for the following components:      Result Value   Glucose, Bld 100 (*)    BUN 29 (*)    Creatinine, Ser 1.01 (*)    GFR calc non Af Amer 49 (*)    GFR calc Af Amer 57 (*)    All other components within normal limits  CBC WITH DIFFERENTIAL/PLATELET    EKG  EKG Interpretation  Date/Time:  Tuesday December 30 2017 13:26:38 EST Ventricular Rate:  66 PR Interval:  186 QRS Duration: 194 QT Interval:  524 QTC Calculation: 549 R Axis:   -97 Text Interpretation:  Atrial-sensed ventricular-paced rhythm Abnormal ECG Paced rhythm appears similar to prior.  No STEMI Confirmed by Antony Blackbird [phone]) on 12/30/2017 3:39:51 PM       Radiology Ct Head Wo Contrast  Result Date: 12/30/2017 CLINICAL DATA:  [place] getting out of the car, striking the right side of the head with hematoma. Anti coagulated. EXAM: CT HEAD WITHOUT CONTRAST CT CERVICAL SPINE WITHOUT CONTRAST TECHNIQUE: Multidetector CT imaging of the head and cervical spine was performed following the standard protocol without intravenous contrast. Multiplanar CT image reconstructions of the cervical spine were also generated. COMPARISON:  04/16/2017 FINDINGS: CT HEAD FINDINGS  Brain: Generalized atrophy. Chronic small-vessel ischemic changes of the hemispheric white matter. No sign of acute infarction, mass lesion, hemorrhage, hydrocephalus or extra-axial collection. Vascular: There is atherosclerotic calcification of the major vessels at the base of the brain. Skull: No skull fracture. Sinuses/Orbits: Chronic inflammatory changes of the right division of the sphenoid sinus. Other sinuses clear. Orbits negative. Other: Right posterior parietal scalp hematoma. CT CERVICAL SPINE FINDINGS Alignment: Normal Skull base and vertebrae: No fracture or focal bone lesion.  Soft tissues and spinal canal: No significant soft tissue finding. Disc levels: Chronic fusion at C2-3. Ordinary osteoarthritis at C1-2. Facet osteoarthritis on the right at C7-T1 and T1-2. Facet osteoarthritis on the left at C3-4 and C7-T1. Degenerative spondylosis at C4-5, C5-6 and C6-7 with mild osteophytic encroachment upon the foramina. Upper chest: Mild chronic scarring. Other: None significant IMPRESSION: Head CT: Right posterior parietal scalp hematoma. No underlying skull fracture or evidence of acute intracranial injury. Chronic small-vessel ischemic changes. Cervical spine CT: No acute or traumatic finding. Chronic degenerative changes as outlined above. Electronically Signed   By: Nelson Chimes M.D.   On: 12/30/2017 14:30   Ct Cervical Spine Wo Contrast  Result Date: 12/30/2017 CLINICAL DATA:  [place] getting out of the car, striking the right side of the head with hematoma. Anti coagulated. EXAM: CT HEAD WITHOUT CONTRAST CT CERVICAL SPINE WITHOUT CONTRAST TECHNIQUE: Multidetector CT imaging of the head and cervical spine was performed following the standard protocol without intravenous contrast. Multiplanar CT image reconstructions of the cervical spine were also generated. COMPARISON:  04/16/2017 FINDINGS: CT HEAD FINDINGS Brain: Generalized atrophy. Chronic small-vessel ischemic changes of the hemispheric white  matter. No sign of acute infarction, mass lesion, hemorrhage, hydrocephalus or extra-axial collection. Vascular: There is atherosclerotic calcification of the major vessels at the base of the brain. Skull: No skull fracture. Sinuses/Orbits: Chronic inflammatory changes of the right division of the sphenoid sinus. Other sinuses clear. Orbits negative. Other: Right posterior parietal scalp hematoma. CT CERVICAL SPINE FINDINGS Alignment: Normal Skull base and vertebrae: No fracture or focal bone lesion. Soft tissues and spinal canal: No significant soft tissue finding. Disc levels: Chronic fusion at C2-3. Ordinary osteoarthritis at C1-2. Facet osteoarthritis on the right at C7-T1 and T1-2. Facet osteoarthritis on the left at C3-4 and C7-T1. Degenerative spondylosis at C4-5, C5-6 and C6-7 with mild osteophytic encroachment upon the foramina. Upper chest: Mild chronic scarring. Other: None significant IMPRESSION: Head CT: Right posterior parietal scalp hematoma. No underlying skull fracture or evidence of acute intracranial injury. Chronic small-vessel ischemic changes. Cervical spine CT: No acute or traumatic finding. Chronic degenerative changes as outlined above. Electronically Signed   By: Nelson Chimes M.D.   On: 12/30/2017 14:30    Procedures Procedures (including critical care time)  Medications Ordered in ED Medications - No data to display   Initial Impression / Assessment and Plan / ED Course  I have reviewed the triage vital signs and the nursing notes.  Pertinent labs & imaging results that were available during my care of the patient were reviewed by me and considered in my medical decision making (see chart for details).     Patient in emergency department after a fall, fall appears to be mechanical, however patient is not sure why she fell. No dizziness, lightheaddiness, normal exam other than hematoma to the back of the head. CT scan and labs obtained at triage, negative. Labs  unremarkable. ECG paced. Pt reassured. Discussed with Dr. Joline Maxcy who agrees with assessment and plan. Home with strict return precautions and close outpatient follow up .    Vitals:   12/30/17 1317 12/30/17 1643  BP:  112/89  Pulse:  95  Resp:  18  SpO2:  100%  Weight: 62.6 kg (138 lb)   Height: 5\' 6"  (1.676 m)      Final Clinical Impressions(s) / ED Diagnoses   Final diagnoses:  Injury of head, initial encounter  Hematoma of scalp, initial encounter    ED Discharge Orders  None       Jeannett Senior, [place] 12/30/17 1649    Tegeler, Gwenyth Allegra, MD 12/31/17 0157

## 2017-12-30 NOTE — ED Triage Notes (Addendum)
Pt reports fall today on concrete while unlocking her car door. Unsure of why she fell. Pt alert, oriented x4, VSS. Large hematoma to posterior scalp. Pt on xarelto for afib.

## 2017-12-31 DIAGNOSIS — I4891 Unspecified atrial fibrillation: Secondary | ICD-10-CM | POA: Diagnosis not present

## 2017-12-31 DIAGNOSIS — G3184 Mild cognitive impairment, so stated: Secondary | ICD-10-CM | POA: Diagnosis not present

## 2017-12-31 DIAGNOSIS — I455 Other specified heart block: Secondary | ICD-10-CM | POA: Diagnosis not present

## 2017-12-31 DIAGNOSIS — M7981 Nontraumatic hematoma of soft tissue: Secondary | ICD-10-CM | POA: Diagnosis not present

## 2018-01-01 DIAGNOSIS — R296 Repeated falls: Secondary | ICD-10-CM | POA: Diagnosis not present

## 2018-01-01 DIAGNOSIS — R2681 Unsteadiness on feet: Secondary | ICD-10-CM | POA: Diagnosis not present

## 2018-01-01 DIAGNOSIS — M545 Low back pain: Secondary | ICD-10-CM | POA: Diagnosis not present

## 2018-01-01 DIAGNOSIS — M6281 Muscle weakness (generalized): Secondary | ICD-10-CM | POA: Diagnosis not present

## 2018-01-06 DIAGNOSIS — Z79899 Other long term (current) drug therapy: Secondary | ICD-10-CM | POA: Diagnosis not present

## 2018-01-07 DIAGNOSIS — M545 Low back pain: Secondary | ICD-10-CM | POA: Diagnosis not present

## 2018-01-07 DIAGNOSIS — R296 Repeated falls: Secondary | ICD-10-CM | POA: Diagnosis not present

## 2018-01-07 DIAGNOSIS — F3289 Other specified depressive episodes: Secondary | ICD-10-CM | POA: Diagnosis not present

## 2018-01-07 DIAGNOSIS — F418 Other specified anxiety disorders: Secondary | ICD-10-CM | POA: Diagnosis not present

## 2018-01-07 DIAGNOSIS — I442 Atrioventricular block, complete: Secondary | ICD-10-CM | POA: Diagnosis not present

## 2018-01-07 DIAGNOSIS — M7981 Nontraumatic hematoma of soft tissue: Secondary | ICD-10-CM | POA: Diagnosis not present

## 2018-01-07 DIAGNOSIS — I4891 Unspecified atrial fibrillation: Secondary | ICD-10-CM | POA: Diagnosis not present

## 2018-01-07 DIAGNOSIS — R2681 Unsteadiness on feet: Secondary | ICD-10-CM | POA: Diagnosis not present

## 2018-01-07 DIAGNOSIS — M6281 Muscle weakness (generalized): Secondary | ICD-10-CM | POA: Diagnosis not present

## 2018-01-13 DIAGNOSIS — M6281 Muscle weakness (generalized): Secondary | ICD-10-CM | POA: Diagnosis not present

## 2018-01-13 DIAGNOSIS — R2681 Unsteadiness on feet: Secondary | ICD-10-CM | POA: Diagnosis not present

## 2018-01-13 DIAGNOSIS — M545 Low back pain: Secondary | ICD-10-CM | POA: Diagnosis not present

## 2018-01-13 DIAGNOSIS — R296 Repeated falls: Secondary | ICD-10-CM | POA: Diagnosis not present

## 2018-01-19 ENCOUNTER — Encounter: Payer: Self-pay | Admitting: Internal Medicine

## 2018-01-19 ENCOUNTER — Telehealth: Payer: Self-pay | Admitting: Internal Medicine

## 2018-01-19 ENCOUNTER — Ambulatory Visit: Payer: PPO | Admitting: Internal Medicine

## 2018-01-19 VITALS — BP 120/62 | HR 67 | Ht 67.0 in | Wt 136.0 lb

## 2018-01-19 DIAGNOSIS — I442 Atrioventricular block, complete: Secondary | ICD-10-CM

## 2018-01-19 DIAGNOSIS — I471 Supraventricular tachycardia: Secondary | ICD-10-CM | POA: Diagnosis not present

## 2018-01-19 DIAGNOSIS — Z950 Presence of cardiac pacemaker: Secondary | ICD-10-CM | POA: Diagnosis not present

## 2018-01-19 DIAGNOSIS — Z79899 Other long term (current) drug therapy: Secondary | ICD-10-CM

## 2018-01-19 DIAGNOSIS — I4891 Unspecified atrial fibrillation: Secondary | ICD-10-CM

## 2018-01-19 LAB — CUP PACEART INCLINIC DEVICE CHECK
Battery Voltage: 2.99 V
Brady Statistic RA Percent Paced: 32 %
Brady Statistic RV Percent Paced: 99.98 %
Date Time Interrogation Session: 20190325133418
Implantable Lead Implant Date: 20181130
Implantable Lead Location: 753859
Implantable Lead Location: 753860
Lead Channel Impedance Value: 587.5 Ohm
Lead Channel Pacing Threshold Amplitude: 1 V
Lead Channel Pacing Threshold Pulse Width: 0.5 ms
Lead Channel Pacing Threshold Pulse Width: 0.5 ms
Lead Channel Pacing Threshold Pulse Width: 0.5 ms
Lead Channel Setting Pacing Amplitude: 0.875
Lead Channel Setting Sensing Sensitivity: 5 mV
MDC IDC LEAD IMPLANT DT: 20181130
MDC IDC MSMT BATTERY REMAINING LONGEVITY: 124 mo
MDC IDC MSMT LEADCHNL RA IMPEDANCE VALUE: 412.5 Ohm
MDC IDC MSMT LEADCHNL RA PACING THRESHOLD AMPLITUDE: 1 V
MDC IDC MSMT LEADCHNL RA SENSING INTR AMPL: 3.4 mV
MDC IDC MSMT LEADCHNL RV PACING THRESHOLD AMPLITUDE: 0.625 V
MDC IDC MSMT LEADCHNL RV PACING THRESHOLD AMPLITUDE: 0.75 V
MDC IDC MSMT LEADCHNL RV PACING THRESHOLD PULSEWIDTH: 0.5 ms
MDC IDC PG IMPLANT DT: 20181130
MDC IDC SET LEADCHNL RA PACING AMPLITUDE: 2 V
MDC IDC SET LEADCHNL RV PACING PULSEWIDTH: 0.5 ms
Pulse Gen Model: 2272
Pulse Gen Serial Number: 8969488

## 2018-01-19 MED ORDER — AMIODARONE HCL 200 MG PO TABS: 100.0000 mg | ORAL_TABLET | Freq: Every day | ORAL | 6 refills | Status: DC

## 2018-01-19 NOTE — Telephone Encounter (Signed)
New Message    Patient would like to speak with Patty Sermons about somethings she forgot to ask when she was in office to you Dr Caryl Comes

## 2018-01-19 NOTE — Progress Notes (Signed)
so      Patient Care Team: Lajean Manes, MD as PCP - General (Internal Medicine)   HPI  Yvonne [name] is a 84 y.o. female Seen in followup for SVT for which she has elected medical therapy.  Echocardiogram 2010 demonstrated mild left ventricular dysfunction. She is chronic left bundle branch block.  She was hospitalized 6/17 for atrial fibrillation with a rapid rate in the context of pneumonia. She was started on amiodarone  She takes Rivaroxaban   Seen 12/18 with dizziness and found to be in complete heart block.  She underwent dual chamber pacing.  DATE TEST EF   6/17 Echo   25-30 %   11/17 Echo  45          she is tolerating the amiodarone without nausea or cough.  She has had a couple falls.  She was seen in the ER recently CT scan was done.  These falls occurred without warning.  Her niece has the impression that they are related to balance.  Her niece describes actually that she swings her right foot when she walks.  Notes reviewed   Date Cr K Hgb TSH LFTs  8/17 0.8 4.6  1.92 56  3/19 1.01 3.8 12.7  18      Past Medical History:  Diagnosis Date  . Anxiety state, unspecified   . Arthritis   . Cancer (HCC)    basil cell carcinomas removed  . Cataract   . Complete heart block ([place])    a. s/p St. Jude PPM 09/26/17.  Marland Kitchen Esophageal reflux   . Hemorrhage of gastrointestinal tract, unspecified    a. occurred on Aleve  . History of peptic ulcer   . LBBB (left bundle branch block)   . Left ventricular systolic dysfunction    hx of mild  . Mitral valve insufficiency and aortic valve insufficiency    a. Remote echo 2010: mild focal basal hypertrophy of septum, EF 50-55%, mid-distal inferoseptal myocardium, grade 1 DD, LBBB, mild AI/MR, PASP mildly increased.  Marland Kitchen PAF (paroxysmal atrial fibrillation) ([place])   . Paroxysmal supraventricular tachycardia ([place])   . Wears glasses     Past Surgical History:  Procedure Laterality Date  . ABDOMINAL HYSTERECTOMY    .  APPENDECTOMY    . BREAST SURGERY     biopsy  . CATARACT EXTRACTION Left   . ESOPHAGOGASTRODUODENOSCOPY    . ESOPHAGOGASTRODUODENOSCOPY (EGD) WITH PROPOFOL N/A 10/10/2015   Procedure: ESOPHAGOGASTRODUODENOSCOPY (EGD) WITH PROPOFOL;  Surgeon: Garlan Fair, MD;  [facility];  Service: Endoscopy;  Laterality: N/A;  . FOOT SURGERY Left    hammer toe and bunion  . KNEE ARTHROSCOPY  2009   left  . OPEN REDUCTION INTERNAL FIXATION (ORIF) METACARPAL Left 11/05/2013   Procedure: CLOSED REDUCTION/PINNING VS OPEN REDUCTION INTERNAL FIXATION (ORIF) LEFT SMALL  METACARPAL FRACTURE ;  Surgeon: Cammie Sickle., MD;  Location: [facility];  Service: Orthopedics;  Laterality: Left;  . ORIF HUMERUS FRACTURE Left 04/10/2015   Procedure: OPEN REDUCTION INTERNAL FIXATION (ORIF) PROXIMAL HUMERUS FRACTURE;  Surgeon: Marybelle Killings, MD;  Location: [facility];  Service: Orthopedics;  Laterality: Left;  . PACEMAKER IMPLANT N/A 09/26/2017   Procedure: PACEMAKER IMPLANT;  Surgeon: Constance Haw, MD;  Location: [facility];  Service: Cardiovascular;  Laterality: N/A;  . TONSILLECTOMY    . TUBAL LIGATION      Current Outpatient Medications  Medication Sig Dispense Refill  . acetaminophen (TYLENOL) 500 MG tablet  Take 500-1,000 mg by mouth every 6 (six) hours as needed (for pain).    Marland Kitchen amiodarone (PACERONE) 200 MG tablet TAKE ONE TABLET DAILY (Patient taking differently: Take 200 mg by mouth in the morning) 30 tablet 6  . buPROPion (WELLBUTRIN XL) 150 MG 24 hr tablet Take 150 mg by mouth daily.    . carvedilol (COREG) 12.5 MG tablet TAKE ONE TABLET TWICE DAILY WITH A MEAL (Patient taking differently: Take 12.5 mg by mouth two times a day with food) 60 tablet 11  . clorazepate (TRANXENE) 7.5 MG tablet Take 7.5 mg by mouth 3 (three) times daily as needed for anxiety.     . docusate sodium (COLACE) 100 MG capsule Take 100-300 mg by mouth daily as needed (constipation).     Marland Kitchen estradiol  (ESTRACE) 0.5 MG tablet Take 0.5 mg by mouth daily.    . fluticasone (FLONASE) 50 MCG/ACT nasal spray Place 2 sprays into both nostrils daily as needed for allergies.   5  . furosemide (LASIX) 20 MG tablet TAKE TWO TABLETS EVERY DAY (Patient taking differently: Take 40 mg by mouth once a day) 60 tablet 9  . ondansetron (ZOFRAN) 8 MG tablet Take 8 mg by mouth every 8 (eight) hours as needed for nausea or vomiting.    . potassium chloride (K-DUR) 10 MEQ tablet Take 10 mEq by mouth 2 (two) times daily.    . sodium chloride (OCEAN) 0.65 % SOLN nasal spray Place 2 sprays into both nostrils daily as needed for congestion.    Alveda Reasons 15 MG TABS tablet TAKE ONE TABLET EACH DAY WITH SUPPER (Patient taking differently: Take 15 mg by mouth once a day with supper) 30 tablet 4   No current facility-administered medications for this visit.     Allergies  Allergen Reactions  . Aleve [Naproxen Sodium] Other (See Comments)    Gi bleed  . Ambien [Zolpidem] Other (See Comments)    Pt states "made me have crazy thoughts"  . Flexeril [Cyclobenzaprine] Other (See Comments)    Urinary rentention  . Adhesive [Tape] Rash and Other (See Comments)    Skin redness  . Aspirin Other (See Comments)    Told to avoid high doses due to history of stomach ulcer    Review of Systems negative except from HPI and PMH  Physical Exam BP 120/62   Pulse 67   Ht 5\' 7"  (1.702 m)   Wt 136 lb (61.7 kg)   SpO2 97%   BMI 21.30 kg/m     Well developed and well nourished in no acute distress HENT normal E scleral and icterus clear Neck Supple JVP 7-8 yes systolic Clear to ausculation Regular rate and rhythm, no murmurs gallops or rub Soft with active bowel sounds No clubbing cyanosis  Edema Alert and oriented, grossly normal motor and sensory function Skin Warm and Dry  ECG with P-synchronous/ AV  pacing   Assessment and  Plan  SVT  Left bundle branch block complete heart block  High risk medication  surveillance  Mitral regurgitation Severe>>mild //mod   Cardiomyopathy-Nonischemic  25>>40-45%  Congestive heart failure-chronic-systolic   Complete heart block  Atrial fibrillation   Falls  I have recommended that she talk to physical therapy regarding further evaluation of her gait and whether there is things that could be done to try to protect her from recurring falls  Device function is normal  On Anticoagulation;  No bleeding issues   Now that she is a complete heart block,  the impact of her atrial fibrillation may well be less.  We will decrease her amiodarone from 200--100 mg daily.  We may be able to stop it altogether.  Tolerating  Will cehck amio labs   Euvolemic continue current meds   We spent more than 50% of our >40 min visit in face to face counseling regarding the above

## 2018-01-19 NOTE — Telephone Encounter (Signed)
Called patient back. Patient wanted to know if Dr. Caryl Comes is ok with refilling her zofran. I told her I would ask and let her know. Patient also calling to see if we have blank medication cards that I could send her. I told her I wasn't aware of any but I would ask around for her. She appreciated my call.

## 2018-01-19 NOTE — Patient Instructions (Addendum)
Medication Instructions:  Your physician has recommended you make the following change in your medication:   1. Decrease your Amiodarone, 1/2 tablet (100mg ) once daily.  Labwork: You will have labs drawn today: TSH and LFTs  Testing/Procedures: None ordered.  Follow-Up: Your physician recommends that you schedule a follow-up appointment in:   9 months with Chanetta Marshall  Remote monitoring is used to monitor your Pacemaker from home. This monitoring reduces the number of office visits required to check your device to one time per year. It allows [place] to keep an eye on the functioning of your device to ensure it is working properly. You are scheduled for a device check from home on 04/20/2018. You may send your transmission at any time that day. If you have a wireless device, the transmission will be sent automatically. After your physician reviews your transmission, you will receive a postcard with your next transmission date.   Any Other Special Instructions Will Be Listed Below (If Applicable).     If you need a refill on your cardiac medications before your next appointment, please call your pharmacy.

## 2018-01-20 DIAGNOSIS — M545 Low back pain: Secondary | ICD-10-CM | POA: Diagnosis not present

## 2018-01-20 DIAGNOSIS — M6281 Muscle weakness (generalized): Secondary | ICD-10-CM | POA: Diagnosis not present

## 2018-01-20 DIAGNOSIS — R2681 Unsteadiness on feet: Secondary | ICD-10-CM | POA: Diagnosis not present

## 2018-01-20 DIAGNOSIS — R296 Repeated falls: Secondary | ICD-10-CM | POA: Diagnosis not present

## 2018-01-20 LAB — HEPATIC FUNCTION PANEL
ALT: 15 IU/L (ref 0–32)
AST: 16 IU/L (ref 0–40)
Albumin: 3.9 g/dL (ref 3.5–4.7)
Alkaline Phosphatase: 117 IU/L (ref 39–117)
BILIRUBIN TOTAL: 0.3 mg/dL (ref 0.0–1.2)
BILIRUBIN, DIRECT: 0.12 mg/dL (ref 0.00–0.40)
Total Protein: 7 g/dL (ref 6.0–8.5)

## 2018-01-20 LAB — TSH: TSH: 0.719 u[IU]/mL (ref 0.450–4.500)

## 2018-01-20 NOTE — Telephone Encounter (Signed)
Spoke with Dr. Caryl Comes. He is not agreeable to filling patients zofran. And I could not file any blank medication cards for the patient.

## 2018-01-21 ENCOUNTER — Other Ambulatory Visit: Payer: Self-pay | Admitting: *Deleted

## 2018-01-21 ENCOUNTER — Other Ambulatory Visit: Payer: Self-pay | Admitting: Internal Medicine

## 2018-01-21 MED ORDER — AMIODARONE HCL 200 MG PO TABS: 100.0000 mg | ORAL_TABLET | Freq: Every day | ORAL | 11 refills | Status: DC

## 2018-01-21 NOTE — Telephone Encounter (Signed)
Age 73years WT 61.7kg  01/19/2018 Saw Dr Caryl Comes on 01/19/2018 12/30/2017 SrCr 1.01 12/30/2017 Hgb 12.7 HCT 39.1 CrCl 38.94 Refill done for Xarelto 15mg  daily as requested

## 2018-01-23 DIAGNOSIS — R2681 Unsteadiness on feet: Secondary | ICD-10-CM | POA: Diagnosis not present

## 2018-01-23 DIAGNOSIS — M6281 Muscle weakness (generalized): Secondary | ICD-10-CM | POA: Diagnosis not present

## 2018-01-23 DIAGNOSIS — M545 Low back pain: Secondary | ICD-10-CM | POA: Diagnosis not present

## 2018-01-23 DIAGNOSIS — R296 Repeated falls: Secondary | ICD-10-CM | POA: Diagnosis not present

## 2018-01-27 DIAGNOSIS — M6281 Muscle weakness (generalized): Secondary | ICD-10-CM | POA: Diagnosis not present

## 2018-01-27 DIAGNOSIS — R296 Repeated falls: Secondary | ICD-10-CM | POA: Diagnosis not present

## 2018-01-27 DIAGNOSIS — M545 Low back pain: Secondary | ICD-10-CM | POA: Diagnosis not present

## 2018-01-27 DIAGNOSIS — R2681 Unsteadiness on feet: Secondary | ICD-10-CM | POA: Diagnosis not present

## 2018-01-29 DIAGNOSIS — I10 Essential (primary) hypertension: Secondary | ICD-10-CM | POA: Diagnosis not present

## 2018-01-29 DIAGNOSIS — F418 Other specified anxiety disorders: Secondary | ICD-10-CM | POA: Diagnosis not present

## 2018-01-29 DIAGNOSIS — Z79899 Other long term (current) drug therapy: Secondary | ICD-10-CM | POA: Diagnosis not present

## 2018-01-29 DIAGNOSIS — G3184 Mild cognitive impairment, so stated: Secondary | ICD-10-CM | POA: Diagnosis not present

## 2018-01-30 DIAGNOSIS — M6281 Muscle weakness (generalized): Secondary | ICD-10-CM | POA: Diagnosis not present

## 2018-01-30 DIAGNOSIS — R296 Repeated falls: Secondary | ICD-10-CM | POA: Diagnosis not present

## 2018-01-30 DIAGNOSIS — M545 Low back pain: Secondary | ICD-10-CM | POA: Diagnosis not present

## 2018-01-30 DIAGNOSIS — R2681 Unsteadiness on feet: Secondary | ICD-10-CM | POA: Diagnosis not present

## 2018-02-03 DIAGNOSIS — M545 Low back pain: Secondary | ICD-10-CM | POA: Diagnosis not present

## 2018-02-03 DIAGNOSIS — M6281 Muscle weakness (generalized): Secondary | ICD-10-CM | POA: Diagnosis not present

## 2018-02-03 DIAGNOSIS — R296 Repeated falls: Secondary | ICD-10-CM | POA: Diagnosis not present

## 2018-02-03 DIAGNOSIS — R2681 Unsteadiness on feet: Secondary | ICD-10-CM | POA: Diagnosis not present

## 2018-02-05 DIAGNOSIS — R296 Repeated falls: Secondary | ICD-10-CM | POA: Diagnosis not present

## 2018-02-05 DIAGNOSIS — R2681 Unsteadiness on feet: Secondary | ICD-10-CM | POA: Diagnosis not present

## 2018-02-05 DIAGNOSIS — M6281 Muscle weakness (generalized): Secondary | ICD-10-CM | POA: Diagnosis not present

## 2018-02-05 DIAGNOSIS — M545 Low back pain: Secondary | ICD-10-CM | POA: Diagnosis not present

## 2018-02-11 DIAGNOSIS — M545 Low back pain: Secondary | ICD-10-CM | POA: Diagnosis not present

## 2018-02-11 DIAGNOSIS — R2681 Unsteadiness on feet: Secondary | ICD-10-CM | POA: Diagnosis not present

## 2018-02-11 DIAGNOSIS — M6281 Muscle weakness (generalized): Secondary | ICD-10-CM | POA: Diagnosis not present

## 2018-02-11 DIAGNOSIS — R296 Repeated falls: Secondary | ICD-10-CM | POA: Diagnosis not present

## 2018-02-13 DIAGNOSIS — M6281 Muscle weakness (generalized): Secondary | ICD-10-CM | POA: Diagnosis not present

## 2018-02-13 DIAGNOSIS — R296 Repeated falls: Secondary | ICD-10-CM | POA: Diagnosis not present

## 2018-02-13 DIAGNOSIS — R2681 Unsteadiness on feet: Secondary | ICD-10-CM | POA: Diagnosis not present

## 2018-02-13 DIAGNOSIS — M545 Low back pain: Secondary | ICD-10-CM | POA: Diagnosis not present

## 2018-02-18 ENCOUNTER — Telehealth: Payer: Self-pay | Admitting: Internal Medicine

## 2018-02-18 DIAGNOSIS — M545 Low back pain: Secondary | ICD-10-CM | POA: Diagnosis not present

## 2018-02-18 DIAGNOSIS — R2681 Unsteadiness on feet: Secondary | ICD-10-CM | POA: Diagnosis not present

## 2018-02-18 DIAGNOSIS — R296 Repeated falls: Secondary | ICD-10-CM | POA: Diagnosis not present

## 2018-02-18 DIAGNOSIS — M6281 Muscle weakness (generalized): Secondary | ICD-10-CM | POA: Diagnosis not present

## 2018-02-18 NOTE — Telephone Encounter (Signed)
Pt calling wanting to speak to the nurse as she is feeling fatigued since she woke up this am, some SOB denies any other symptoms-pls advise

## 2018-02-18 NOTE — Telephone Encounter (Signed)
Transmission not received. Instructed patient on how to send a transmission. She is sending now. I will call back upon receipt.

## 2018-02-18 NOTE — Telephone Encounter (Signed)
Pt has c/o generalized fatigue today. SBP is in 120's today and she is unaware of her heart rate or rhythm. Pt sent over a remote transmission, will forward to device clinic to investigate.

## 2018-02-19 NOTE — Telephone Encounter (Signed)
Remote transmission reviewed. Presenting rhythm: AsVp. No atrial or ventricular arrhythmias noted. Ap37%, Vp>99%. Histogram appropriate for activity level. Data consistent with previous measurements.   Called patient about recent sx's and transmission results. Patient states that she began feeling really "droopy" yesterday. She said that she's so fatigued that she can't do her therapy. Patient states that she may have felt this feeling before, but could not be sure. Patient also stated that she does not sleep well at night. She says that she has trouble getting to sleep and staying asleep. She said that the lack of sleep has been going on for a "long time". Patient is unsure if she snores. I told patient that her droopy feeling could be coming from the lack of sleep. I encouraged her to contact her PCP about her recent sx's and lack of sleep. Patient verbalized understanding and plans to call.  Will forward information to Dr.Klein and his RN.

## 2018-02-20 DIAGNOSIS — R296 Repeated falls: Secondary | ICD-10-CM | POA: Diagnosis not present

## 2018-02-20 DIAGNOSIS — M545 Low back pain: Secondary | ICD-10-CM | POA: Diagnosis not present

## 2018-02-20 DIAGNOSIS — R2681 Unsteadiness on feet: Secondary | ICD-10-CM | POA: Diagnosis not present

## 2018-02-20 DIAGNOSIS — M6281 Muscle weakness (generalized): Secondary | ICD-10-CM | POA: Diagnosis not present

## 2018-02-20 NOTE — Telephone Encounter (Signed)
Noted  

## 2018-03-03 DIAGNOSIS — R296 Repeated falls: Secondary | ICD-10-CM | POA: Diagnosis not present

## 2018-03-03 DIAGNOSIS — R2681 Unsteadiness on feet: Secondary | ICD-10-CM | POA: Diagnosis not present

## 2018-03-03 DIAGNOSIS — M545 Low back pain: Secondary | ICD-10-CM | POA: Diagnosis not present

## 2018-03-03 DIAGNOSIS — M6281 Muscle weakness (generalized): Secondary | ICD-10-CM | POA: Diagnosis not present

## 2018-03-10 DIAGNOSIS — M25562 Pain in left knee: Secondary | ICD-10-CM | POA: Diagnosis not present

## 2018-03-11 DIAGNOSIS — M6281 Muscle weakness (generalized): Secondary | ICD-10-CM | POA: Diagnosis not present

## 2018-03-11 DIAGNOSIS — R296 Repeated falls: Secondary | ICD-10-CM | POA: Diagnosis not present

## 2018-03-11 DIAGNOSIS — R2681 Unsteadiness on feet: Secondary | ICD-10-CM | POA: Diagnosis not present

## 2018-03-11 DIAGNOSIS — M545 Low back pain: Secondary | ICD-10-CM | POA: Diagnosis not present

## 2018-04-06 DIAGNOSIS — I5022 Chronic systolic (congestive) heart failure: Secondary | ICD-10-CM | POA: Diagnosis not present

## 2018-04-06 DIAGNOSIS — M546 Pain in thoracic spine: Secondary | ICD-10-CM | POA: Diagnosis not present

## 2018-04-06 DIAGNOSIS — I480 Paroxysmal atrial fibrillation: Secondary | ICD-10-CM | POA: Diagnosis not present

## 2018-04-06 DIAGNOSIS — M25562 Pain in left knee: Secondary | ICD-10-CM | POA: Diagnosis not present

## 2018-04-06 DIAGNOSIS — F325 Major depressive disorder, single episode, in full remission: Secondary | ICD-10-CM | POA: Diagnosis not present

## 2018-04-20 ENCOUNTER — Ambulatory Visit (INDEPENDENT_AMBULATORY_CARE_PROVIDER_SITE_OTHER): Payer: PPO | Admitting: *Deleted

## 2018-04-20 DIAGNOSIS — I442 Atrioventricular block, complete: Secondary | ICD-10-CM

## 2018-04-20 DIAGNOSIS — M1712 Unilateral primary osteoarthritis, left knee: Secondary | ICD-10-CM | POA: Diagnosis not present

## 2018-04-20 NOTE — Progress Notes (Signed)
Remote pacemaker transmission.   

## 2018-04-21 ENCOUNTER — Encounter: Payer: Self-pay | Admitting: Cardiology

## 2018-04-24 LAB — CUP PACEART REMOTE DEVICE CHECK
Battery Remaining Percentage: 95.5 %
Brady Statistic RA Percent Paced: 41 %
Date Time Interrogation Session: 20190624060013
Implantable Lead Implant Date: 20181130
Implantable Lead Implant Date: 20181130
Implantable Lead Location: 753859
Implantable Lead Location: 753860
Implantable Pulse Generator Implant Date: 20181130
Lead Channel Impedance Value: 400 Ohm
Lead Channel Impedance Value: 540 Ohm
Lead Channel Pacing Threshold Amplitude: 0.625 V
Lead Channel Pacing Threshold Pulse Width: 0.5 ms
Lead Channel Pacing Threshold Pulse Width: 0.5 ms
Lead Channel Setting Pacing Amplitude: 2 V
Lead Channel Setting Pacing Pulse Width: 0.5 ms
MDC IDC MSMT BATTERY REMAINING LONGEVITY: 116 mo
MDC IDC MSMT BATTERY VOLTAGE: 3.01 V
MDC IDC MSMT LEADCHNL RA PACING THRESHOLD AMPLITUDE: 1 V
MDC IDC MSMT LEADCHNL RA SENSING INTR AMPL: 2.8 mV
MDC IDC PG SERIAL: 8969488
MDC IDC SET LEADCHNL RV PACING AMPLITUDE: 0.875
MDC IDC SET LEADCHNL RV SENSING SENSITIVITY: 5 mV
MDC IDC STAT BRADY AP VP PERCENT: 41 %
MDC IDC STAT BRADY AP VS PERCENT: 1 %
MDC IDC STAT BRADY AS VP PERCENT: 59 %
MDC IDC STAT BRADY AS VS PERCENT: 1 %
MDC IDC STAT BRADY RV PERCENT PACED: 99 %
Pulse Gen Model: 2272

## 2018-04-27 DIAGNOSIS — M1712 Unilateral primary osteoarthritis, left knee: Secondary | ICD-10-CM | POA: Diagnosis not present

## 2018-05-04 DIAGNOSIS — M1712 Unilateral primary osteoarthritis, left knee: Secondary | ICD-10-CM | POA: Diagnosis not present

## 2018-05-14 DIAGNOSIS — L308 Other specified dermatitis: Secondary | ICD-10-CM | POA: Diagnosis not present

## 2018-05-14 DIAGNOSIS — L0889 Other specified local infections of the skin and subcutaneous tissue: Secondary | ICD-10-CM | POA: Diagnosis not present

## 2018-05-14 DIAGNOSIS — H00015 Hordeolum externum left lower eyelid: Secondary | ICD-10-CM | POA: Diagnosis not present

## 2018-05-14 DIAGNOSIS — Z85828 Personal history of other malignant neoplasm of skin: Secondary | ICD-10-CM | POA: Diagnosis not present

## 2018-05-25 ENCOUNTER — Telehealth: Payer: Self-pay | Admitting: Internal Medicine

## 2018-05-25 MED ORDER — RIVAROXABAN 15 MG PO TABS: 15.0000 mg | ORAL_TABLET | Freq: Every day | ORAL | 5 refills | Status: DC

## 2018-05-25 NOTE — Telephone Encounter (Signed)
Called Yvonne [name] and explained to Yvonne [name] that I was leaving a 2 bottles of Xarelto 15 mg tablets at the front desk, along with patient assistance forms for Yvonne [name] to fill out and return ASAP to [employer], LPN, to see if Yvonne [name] can get assistance. Yvonne [name] verbalized understanding. Yvonne [name] also needs a Rx sent to Yvonne [name]'s pharmacy, because Yvonne [name] does not have any refills left. Please address

## 2018-05-25 NOTE — Telephone Encounter (Signed)
Pt last saw Dr Caryl Comes 01/19/18, last labs 12/30/17 Creat 1.01, age 84, weight 61.7kg, CrCl 38.22, based on CrCl pt is on appropriate dosage of Xarelto 15mg  QD.  Will refill rx.

## 2018-05-25 NOTE — Telephone Encounter (Signed)
New Message        Patient calling the office for samples of medication:   1.  What medication and dosage are you requesting samples for? Xareto 15 mg  2.  Are you currently out of this medication? Yes      Patient is in the donut hole and need samples

## 2018-06-24 DIAGNOSIS — L245 Irritant contact dermatitis due to other chemical products: Secondary | ICD-10-CM | POA: Diagnosis not present

## 2018-06-24 DIAGNOSIS — Z85828 Personal history of other malignant neoplasm of skin: Secondary | ICD-10-CM | POA: Diagnosis not present

## 2018-06-24 DIAGNOSIS — L821 Other seborrheic keratosis: Secondary | ICD-10-CM | POA: Diagnosis not present

## 2018-07-20 ENCOUNTER — Ambulatory Visit (INDEPENDENT_AMBULATORY_CARE_PROVIDER_SITE_OTHER): Payer: PPO | Admitting: *Deleted

## 2018-07-20 DIAGNOSIS — I442 Atrioventricular block, complete: Secondary | ICD-10-CM

## 2018-07-20 NOTE — Progress Notes (Signed)
Remote pacemaker transmission.   

## 2018-07-21 ENCOUNTER — Encounter: Payer: Self-pay | Admitting: Cardiology

## 2018-08-09 LAB — CUP PACEART REMOTE DEVICE CHECK
Battery Voltage: 3.01 V
Brady Statistic AP VP Percent: 38 %
Brady Statistic AS VP Percent: 60 %
Brady Statistic AS VS Percent: 2.1 %
Brady Statistic RA Percent Paced: 38 %
Implantable Lead Implant Date: 20181130
Implantable Lead Location: 753859
Lead Channel Impedance Value: 540 Ohm
Lead Channel Pacing Threshold Amplitude: 1 V
Lead Channel Pacing Threshold Pulse Width: 0.5 ms
Lead Channel Sensing Intrinsic Amplitude: 9.5 mV
Lead Channel Setting Pacing Amplitude: 0.875
Lead Channel Setting Pacing Amplitude: 2 V
MDC IDC LEAD IMPLANT DT: 20181130
MDC IDC LEAD LOCATION: 753860
MDC IDC MSMT BATTERY REMAINING LONGEVITY: 115 mo
MDC IDC MSMT BATTERY REMAINING PERCENTAGE: 95.5 %
MDC IDC MSMT LEADCHNL RA IMPEDANCE VALUE: 400 Ohm
MDC IDC MSMT LEADCHNL RA PACING THRESHOLD PULSEWIDTH: 0.5 ms
MDC IDC MSMT LEADCHNL RA SENSING INTR AMPL: 2.7 mV
MDC IDC MSMT LEADCHNL RV PACING THRESHOLD AMPLITUDE: 0.625 V
MDC IDC PG IMPLANT DT: 20181130
MDC IDC PG SERIAL: 8969488
MDC IDC SESS DTM: 20190923062648
MDC IDC SET LEADCHNL RV PACING PULSEWIDTH: 0.5 ms
MDC IDC SET LEADCHNL RV SENSING SENSITIVITY: 5 mV
MDC IDC STAT BRADY AP VS PERCENT: 1 %
MDC IDC STAT BRADY RV PERCENT PACED: 98 %

## 2018-08-11 DIAGNOSIS — I471 Supraventricular tachycardia: Secondary | ICD-10-CM | POA: Diagnosis not present

## 2018-08-11 DIAGNOSIS — Z23 Encounter for immunization: Secondary | ICD-10-CM | POA: Diagnosis not present

## 2018-08-11 DIAGNOSIS — M8580 Other specified disorders of bone density and structure, unspecified site: Secondary | ICD-10-CM | POA: Diagnosis not present

## 2018-08-11 DIAGNOSIS — Z79899 Other long term (current) drug therapy: Secondary | ICD-10-CM | POA: Diagnosis not present

## 2018-08-11 DIAGNOSIS — I5022 Chronic systolic (congestive) heart failure: Secondary | ICD-10-CM | POA: Diagnosis not present

## 2018-08-11 DIAGNOSIS — F325 Major depressive disorder, single episode, in full remission: Secondary | ICD-10-CM | POA: Diagnosis not present

## 2018-08-11 DIAGNOSIS — R110 Nausea: Secondary | ICD-10-CM | POA: Diagnosis not present

## 2018-08-11 DIAGNOSIS — I700 Atherosclerosis of aorta: Secondary | ICD-10-CM | POA: Diagnosis not present

## 2018-08-11 DIAGNOSIS — I480 Paroxysmal atrial fibrillation: Secondary | ICD-10-CM | POA: Diagnosis not present

## 2018-08-11 DIAGNOSIS — F419 Anxiety disorder, unspecified: Secondary | ICD-10-CM | POA: Diagnosis not present

## 2018-08-11 DIAGNOSIS — I351 Nonrheumatic aortic (valve) insufficiency: Secondary | ICD-10-CM | POA: Diagnosis not present

## 2018-08-11 DIAGNOSIS — R269 Unspecified abnormalities of gait and mobility: Secondary | ICD-10-CM | POA: Diagnosis not present

## 2018-08-11 DIAGNOSIS — Z0000 Encounter for general adult medical examination without abnormal findings: Secondary | ICD-10-CM | POA: Diagnosis not present

## 2018-08-11 DIAGNOSIS — Z1389 Encounter for screening for other disorder: Secondary | ICD-10-CM | POA: Diagnosis not present

## 2018-08-17 ENCOUNTER — Other Ambulatory Visit: Payer: Self-pay | Admitting: Internal Medicine

## 2018-10-19 ENCOUNTER — Ambulatory Visit (INDEPENDENT_AMBULATORY_CARE_PROVIDER_SITE_OTHER): Payer: PPO

## 2018-10-19 ENCOUNTER — Other Ambulatory Visit: Payer: Self-pay | Admitting: Internal Medicine

## 2018-10-19 DIAGNOSIS — I442 Atrioventricular block, complete: Secondary | ICD-10-CM

## 2018-10-19 NOTE — Progress Notes (Signed)
Remote pacemaker transmission.   

## 2018-10-21 LAB — CUP PACEART REMOTE DEVICE CHECK
Battery Remaining Percentage: 95.5 %
Battery Voltage: 3.01 V
Brady Statistic AS VS Percent: 1.4 %
Brady Statistic RA Percent Paced: 33 %
Brady Statistic RV Percent Paced: 99 %
Implantable Lead Implant Date: 20181130
Implantable Lead Implant Date: 20181130
Implantable Lead Location: 753859
Implantable Pulse Generator Implant Date: 20181130
Lead Channel Pacing Threshold Amplitude: 1 V
Lead Channel Pacing Threshold Pulse Width: 0.5 ms
Lead Channel Pacing Threshold Pulse Width: 0.5 ms
Lead Channel Sensing Intrinsic Amplitude: 3.2 mV
Lead Channel Setting Pacing Amplitude: 1 V
Lead Channel Setting Sensing Sensitivity: 5 mV
MDC IDC LEAD LOCATION: 753860
MDC IDC MSMT BATTERY REMAINING LONGEVITY: 123 mo
MDC IDC MSMT LEADCHNL RA IMPEDANCE VALUE: 400 Ohm
MDC IDC MSMT LEADCHNL RV IMPEDANCE VALUE: 580 Ohm
MDC IDC MSMT LEADCHNL RV PACING THRESHOLD AMPLITUDE: 0.75 V
MDC IDC MSMT LEADCHNL RV SENSING INTR AMPL: 9.5 mV
MDC IDC PG SERIAL: 8969488
MDC IDC SESS DTM: 20191223070015
MDC IDC SET LEADCHNL RA PACING AMPLITUDE: 2 V
MDC IDC SET LEADCHNL RV PACING PULSEWIDTH: 0.5 ms
MDC IDC STAT BRADY AP VP PERCENT: 34 %
MDC IDC STAT BRADY AP VS PERCENT: 1 %
MDC IDC STAT BRADY AS VP PERCENT: 65 %

## 2018-10-27 ENCOUNTER — Other Ambulatory Visit: Payer: Self-pay | Admitting: Internal Medicine

## 2019-01-18 ENCOUNTER — Other Ambulatory Visit: Payer: Self-pay

## 2019-01-18 ENCOUNTER — Ambulatory Visit (INDEPENDENT_AMBULATORY_CARE_PROVIDER_SITE_OTHER): Payer: PPO | Admitting: *Deleted

## 2019-01-18 DIAGNOSIS — I442 Atrioventricular block, complete: Secondary | ICD-10-CM | POA: Diagnosis not present

## 2019-01-18 DIAGNOSIS — I471 Supraventricular tachycardia: Secondary | ICD-10-CM

## 2019-01-19 LAB — CUP PACEART REMOTE DEVICE CHECK
Battery Remaining Longevity: 125 mo
Battery Remaining Percentage: 95.5 %
Battery Voltage: 3.01 V
Brady Statistic AP VP Percent: 31 %
Brady Statistic AS VS Percent: 1 %
Brady Statistic RA Percent Paced: 31 %
Implantable Lead Implant Date: 20181130
Implantable Lead Implant Date: 20181130
Implantable Lead Location: 753859
Lead Channel Impedance Value: 430 Ohm
Lead Channel Impedance Value: 630 Ohm
Lead Channel Pacing Threshold Amplitude: 1 V
Lead Channel Pacing Threshold Pulse Width: 0.5 ms
Lead Channel Pacing Threshold Pulse Width: 0.5 ms
Lead Channel Sensing Intrinsic Amplitude: 3.2 mV
Lead Channel Setting Pacing Amplitude: 1 V
MDC IDC LEAD LOCATION: 753860
MDC IDC MSMT LEADCHNL RV PACING THRESHOLD AMPLITUDE: 0.75 V
MDC IDC MSMT LEADCHNL RV SENSING INTR AMPL: 9.5 mV
MDC IDC PG IMPLANT DT: 20181130
MDC IDC PG SERIAL: 8969488
MDC IDC SESS DTM: 20200323075550
MDC IDC SET LEADCHNL RA PACING AMPLITUDE: 2 V
MDC IDC SET LEADCHNL RV PACING PULSEWIDTH: 0.5 ms
MDC IDC SET LEADCHNL RV SENSING SENSITIVITY: 5 mV
MDC IDC STAT BRADY AP VS PERCENT: 1 %
MDC IDC STAT BRADY AS VP PERCENT: 68 %
MDC IDC STAT BRADY RV PERCENT PACED: 99 %

## 2019-01-27 ENCOUNTER — Other Ambulatory Visit: Payer: Self-pay | Admitting: Internal Medicine

## 2019-01-27 NOTE — Progress Notes (Signed)
Remote pacemaker transmission.   

## 2019-02-03 ENCOUNTER — Other Ambulatory Visit: Payer: Self-pay | Admitting: Internal Medicine

## 2019-02-15 ENCOUNTER — Other Ambulatory Visit: Payer: Self-pay | Admitting: Internal Medicine

## 2019-02-15 DIAGNOSIS — I5022 Chronic systolic (congestive) heart failure: Secondary | ICD-10-CM | POA: Diagnosis not present

## 2019-02-15 DIAGNOSIS — I700 Atherosclerosis of aorta: Secondary | ICD-10-CM | POA: Diagnosis not present

## 2019-02-15 DIAGNOSIS — I480 Paroxysmal atrial fibrillation: Secondary | ICD-10-CM | POA: Diagnosis not present

## 2019-02-15 NOTE — Telephone Encounter (Signed)
85yo, 136lbs, Scr 1.01 on 12/30/17, Crcl 91ml/min Last OV3/25/19 (will refill given COVID) Indication afib

## 2019-02-20 ENCOUNTER — Other Ambulatory Visit: Payer: Self-pay | Admitting: Internal Medicine

## 2019-04-19 ENCOUNTER — Ambulatory Visit (INDEPENDENT_AMBULATORY_CARE_PROVIDER_SITE_OTHER): Payer: PPO | Admitting: *Deleted

## 2019-04-19 DIAGNOSIS — I442 Atrioventricular block, complete: Secondary | ICD-10-CM | POA: Diagnosis not present

## 2019-04-19 DIAGNOSIS — I428 Other cardiomyopathies: Secondary | ICD-10-CM

## 2019-04-19 LAB — CUP PACEART REMOTE DEVICE CHECK
Battery Remaining Longevity: 122 mo
Battery Remaining Percentage: 95.5 %
Battery Voltage: 3.01 V
Brady Statistic AP VP Percent: 27 %
Brady Statistic AP VS Percent: 1 %
Brady Statistic AS VP Percent: 72 %
Brady Statistic AS VS Percent: 1 %
Brady Statistic RA Percent Paced: 27 %
Brady Statistic RV Percent Paced: 99 %
Date Time Interrogation Session: 20200622060017
Implantable Lead Implant Date: 20181130
Implantable Lead Implant Date: 20181130
Implantable Lead Location: 753859
Implantable Lead Location: 753860
Implantable Pulse Generator Implant Date: 20181130
Lead Channel Impedance Value: 350 Ohm
Lead Channel Impedance Value: 540 Ohm
Lead Channel Pacing Threshold Amplitude: 0.625 V
Lead Channel Pacing Threshold Amplitude: 1 V
Lead Channel Pacing Threshold Pulse Width: 0.5 ms
Lead Channel Pacing Threshold Pulse Width: 0.5 ms
Lead Channel Sensing Intrinsic Amplitude: 3 mV
Lead Channel Sensing Intrinsic Amplitude: 9.5 mV
Lead Channel Setting Pacing Amplitude: 0.875
Lead Channel Setting Pacing Amplitude: 2 V
Lead Channel Setting Pacing Pulse Width: 0.5 ms
Lead Channel Setting Sensing Sensitivity: 5 mV
Pulse Gen Model: 2272
Pulse Gen Serial Number: 8969488

## 2019-04-28 ENCOUNTER — Other Ambulatory Visit: Payer: Self-pay | Admitting: Internal Medicine

## 2019-04-28 NOTE — Progress Notes (Signed)
Remote pacemaker transmission.   

## 2019-05-03 ENCOUNTER — Other Ambulatory Visit: Payer: Self-pay | Admitting: Internal Medicine

## 2019-05-17 ENCOUNTER — Other Ambulatory Visit: Payer: Self-pay | Admitting: Internal Medicine

## 2019-05-20 ENCOUNTER — Encounter: Payer: PPO | Admitting: Internal Medicine

## 2019-05-26 ENCOUNTER — Encounter: Payer: PPO | Admitting: Internal Medicine

## 2019-06-08 ENCOUNTER — Other Ambulatory Visit: Payer: Self-pay | Admitting: Internal Medicine

## 2019-06-14 ENCOUNTER — Ambulatory Visit (INDEPENDENT_AMBULATORY_CARE_PROVIDER_SITE_OTHER): Payer: PPO | Admitting: Internal Medicine

## 2019-06-14 ENCOUNTER — Other Ambulatory Visit: Payer: Self-pay

## 2019-06-14 ENCOUNTER — Encounter (INDEPENDENT_AMBULATORY_CARE_PROVIDER_SITE_OTHER): Payer: Self-pay

## 2019-06-14 ENCOUNTER — Encounter: Payer: Self-pay | Admitting: Internal Medicine

## 2019-06-14 VITALS — BP 124/70 | HR 60 | Ht 67.0 in | Wt 138.0 lb

## 2019-06-14 DIAGNOSIS — I428 Other cardiomyopathies: Secondary | ICD-10-CM

## 2019-06-14 DIAGNOSIS — I442 Atrioventricular block, complete: Secondary | ICD-10-CM

## 2019-06-14 DIAGNOSIS — Z950 Presence of cardiac pacemaker: Secondary | ICD-10-CM | POA: Diagnosis not present

## 2019-06-14 LAB — HEPATIC FUNCTION PANEL
ALT: 11 IU/L (ref 0–32)
AST: 18 IU/L (ref 0–40)
Albumin: 4.5 g/dL (ref 3.6–4.6)
Alkaline Phosphatase: 75 IU/L (ref 39–117)
Bilirubin Total: 0.3 mg/dL (ref 0.0–1.2)
Bilirubin, Direct: 0.1 mg/dL (ref 0.00–0.40)
Total Protein: 6.8 g/dL (ref 6.0–8.5)

## 2019-06-14 LAB — CUP PACEART INCLINIC DEVICE CHECK
Battery Remaining Longevity: 127 mo
Battery Voltage: 3.01 V
Brady Statistic RA Percent Paced: 26 %
Brady Statistic RV Percent Paced: 99.21 %
Date Time Interrogation Session: 20200817132900
Implantable Lead Implant Date: 20181130
Implantable Lead Implant Date: 20181130
Implantable Lead Location: 753859
Implantable Lead Location: 753860
Implantable Pulse Generator Implant Date: 20181130
Lead Channel Impedance Value: 375 Ohm
Lead Channel Impedance Value: 537.5 Ohm
Lead Channel Pacing Threshold Amplitude: 0.625 V
Lead Channel Pacing Threshold Amplitude: 1 V
Lead Channel Pacing Threshold Pulse Width: 0.5 ms
Lead Channel Pacing Threshold Pulse Width: 0.5 ms
Lead Channel Sensing Intrinsic Amplitude: 2.3 mV
Lead Channel Setting Pacing Amplitude: 0.875
Lead Channel Setting Pacing Amplitude: 2 V
Lead Channel Setting Pacing Pulse Width: 0.5 ms
Lead Channel Setting Sensing Sensitivity: 5 mV
Pulse Gen Model: 2272
Pulse Gen Serial Number: 8969488

## 2019-06-14 LAB — BASIC METABOLIC PANEL
BUN/Creatinine Ratio: 24 (ref 12–28)
BUN: 27 mg/dL (ref 8–27)
CO2: 24 mmol/L (ref 20–29)
Calcium: 9.1 mg/dL (ref 8.7–10.3)
Chloride: 100 mmol/L (ref 96–106)
Creatinine, Ser: 1.12 mg/dL — ABNORMAL HIGH (ref 0.57–1.00)
GFR calc Af Amer: 51 mL/min/{1.73_m2} — ABNORMAL LOW (ref >59)
GFR calc non Af Amer: 44 mL/min/{1.73_m2} — ABNORMAL LOW (ref >59)
Glucose: 88 mg/dL (ref 65–99)
Potassium: 4.6 mmol/L (ref 3.5–5.2)
Sodium: 138 mmol/L (ref 134–144)

## 2019-06-14 LAB — CBC
Hematocrit: 37 % (ref 34.0–46.6)
Hemoglobin: 12 g/dL (ref 11.1–15.9)
MCH: 29.6 pg (ref 26.6–33.0)
MCHC: 32.4 g/dL (ref 31.5–35.7)
MCV: 91 fL (ref 79–97)
Platelets: 262 10*3/uL (ref 150–450)
RBC: 4.06 x10E6/uL (ref 3.77–5.28)
RDW: 13.2 % (ref 11.7–15.4)
WBC: 6.4 10*3/uL (ref 3.4–10.8)

## 2019-06-14 LAB — TSH: TSH: 1.53 u[IU]/mL (ref 0.450–4.500)

## 2019-06-14 NOTE — Patient Instructions (Signed)

## 2019-06-14 NOTE — Progress Notes (Signed)
so      Patient Care Team: Lajean Manes, MD as PCP - General (Internal Medicine)   HPI  Yvonne [name] is a 85 y.o. female Seen in followup for atrial fibrillation and complete heart block--pacemaker  St Jude   She was hospitalized 6/17 for atrial fibrillation with a rapid rate in the context of pneumonia. She was started on amiodarone  She takes Rivaroxaban   Seen 12/18 with dizziness and found to be in complete heart block.  She underwent dual chamber pacing.  DATE TEST EF   6/17 Echo   25-30 %   11/17 Echo  45         Amio tolerated without cough or nausea  The patient denies chest pain, shortness of breath, nocturnal dyspnea, orthopnea or peripheral edema.  There have been no palpitations, lightheadedness or syncope.   Husband of 59 yrs died fall 01-30-2018      Date Cr K Hgb TSH LFTs  8/17 0.8 4.6  1.92 56  3/19 1.01 3.8 12.7  18             Past Medical History:  Diagnosis Date  . Anxiety state, unspecified   . Arthritis   . Cancer (HCC)    basil cell carcinomas removed  . Cataract   . Complete heart block ([place])    a. s/p St. Jude PPM 09/26/17.  Marland Kitchen Esophageal reflux   . Hemorrhage of gastrointestinal tract, unspecified    a. occurred on Aleve  . History of peptic ulcer   . LBBB (left bundle branch block)   . Left ventricular systolic dysfunction    hx of mild  . Mitral valve insufficiency and aortic valve insufficiency    a. Remote echo 2009-01-30: mild focal basal hypertrophy of septum, EF 50-55%, mid-distal inferoseptal myocardium, grade 1 DD, LBBB, mild AI/MR, PASP mildly increased.  Marland Kitchen PAF (paroxysmal atrial fibrillation) ([place])   . Paroxysmal supraventricular tachycardia ([place])   . Wears glasses     Past Surgical History:  Procedure Laterality Date  . ABDOMINAL HYSTERECTOMY    . APPENDECTOMY    . BREAST SURGERY     biopsy  . CATARACT EXTRACTION Left   . ESOPHAGOGASTRODUODENOSCOPY    . ESOPHAGOGASTRODUODENOSCOPY (EGD) WITH PROPOFOL N/A 10/10/2015   Procedure: ESOPHAGOGASTRODUODENOSCOPY (EGD) WITH PROPOFOL;  Surgeon: Garlan Fair, MD;  Location: [facility];  Service: Endoscopy;  Laterality: N/A;  . FOOT SURGERY Left    hammer toe and bunion  . KNEE ARTHROSCOPY  01-31-08   left  . OPEN REDUCTION INTERNAL FIXATION (ORIF) METACARPAL Left 11/05/2013   Procedure: CLOSED REDUCTION/PINNING VS OPEN REDUCTION INTERNAL FIXATION (ORIF) LEFT SMALL  METACARPAL FRACTURE ;  Surgeon: Cammie Sickle., MD;  Location: [facility];  Service: Orthopedics;  Laterality: Left;  . ORIF HUMERUS FRACTURE Left 04/10/2015   Procedure: OPEN REDUCTION INTERNAL FIXATION (ORIF) PROXIMAL HUMERUS FRACTURE;  Surgeon: Marybelle Killings, MD;  Location: [facility];  Service: Orthopedics;  Laterality: Left;  . PACEMAKER IMPLANT N/A 09/26/2017   Procedure: PACEMAKER IMPLANT;  Surgeon: Constance Haw, MD;  Location: [facility];  Service: Cardiovascular;  Laterality: N/A;  . TONSILLECTOMY    . TUBAL LIGATION      Current Outpatient Medications  Medication Sig Dispense Refill  . acetaminophen (TYLENOL) 500 MG tablet Take 500-1,000 mg by mouth every 6 (six) hours as needed (for pain).    Marland Kitchen amiodarone (PACERONE) 200 MG tablet Take 0.5 tablets (100 mg total) by  mouth daily. Please keep upcoming appt for future refills. Thank you 45 tablet 0  . buPROPion (WELLBUTRIN XL) 300 MG 24 hr tablet Take 1 tablet by mouth daily.    . carvedilol (COREG) 12.5 MG tablet Take 1 tablet (12.5 mg total) by mouth 2 (two) times daily with a meal. Please schedule appt for future refills. 1st attempt 180 tablet 0  . clorazepate (TRANXENE) 7.5 MG tablet Take 7.5 mg by mouth 3 (three) times daily as needed for anxiety.     Marland Kitchen estradiol (ESTRACE) 0.5 MG tablet Take 0.5 mg by mouth daily.    . fluticasone (FLONASE) 50 MCG/ACT nasal spray Place 2 sprays into both nostrils daily as needed for allergies.   5  . furosemide (LASIX) 20 MG tablet Take 2 tablets (40 mg total) by mouth daily. 180  tablet 1  . ondansetron (ZOFRAN) 8 MG tablet Take 8 mg by mouth every 8 (eight) hours as needed for nausea or vomiting.    . potassium chloride (K-DUR) 10 MEQ tablet Take 10 mEq by mouth 2 (two) times daily.    . sodium chloride (OCEAN) 0.65 % SOLN nasal spray Place 2 sprays into both nostrils daily as needed for congestion.    Alveda Reasons 15 MG TABS tablet TAKE ONE TABLET DAILY WITH SUPPER 30 tablet 5   No current facility-administered medications for this visit.     Allergies  Allergen Reactions  . Aleve [Naproxen Sodium] Other (See Comments)    Gi bleed  . Ambien [Zolpidem] Other (See Comments)    Pt states "made me have crazy thoughts"  . Flexeril [Cyclobenzaprine] Other (See Comments)    Urinary rentention  . Adhesive [Tape] Rash and Other (See Comments)    Skin redness  . Aspirin Other (See Comments)    Told to avoid high doses due to history of stomach ulcer    Review of Systems negative except from HPI and PMH  Physical Exam BP 124/70   Pulse 60   Ht 5\' 7"  (1.702 m)   Wt 138 lb (62.6 kg)   SpO2 96%   BMI 21.61 kg/m       Well developed and well nourished in no acute distress HENT normal Neck supple with JVP-flat Clear Device pocket well healed; without hematoma or erythema.  There is no tethering  Regular rate and rhythm, no  gallop murmur Abd-soft with active BS No Clubbing cyanosis  edema Skin-warm and dry A & Oriented  Grossly normal sensory and motor function  ECG AV pacing   Assessment and  Plan  SVT  Left bundle branch block complete heart block  High risk medication surveillance  Mitral regurgitation Severe>>mild //mod   Cardiomyopathy-Nonischemic  25>>40-45%  Congestive heart failure-chronic-systolic/diastolic  Complete heart block  Pacemaker St Jude The patient's device was interrogated and the information was fully reviewed.  The device was reprogrammed to increase atrial sensitivity 2/2 loss of afib detection 2/2 undersensing   Atrial  fibrillation   Falls    Low burden afib, with some undersensing  Device reprogrammed  No intercurrent falls  Euvolemic continue current meds  On Anticoagulation;  No bleeding issues   Needs amio surveillance labs and Hgb on Rivaroxaban

## 2019-06-21 ENCOUNTER — Telehealth: Payer: Self-pay | Admitting: Internal Medicine

## 2019-06-21 NOTE — Telephone Encounter (Signed)
Pt was asking about her lab work, which was all normal. She states she thought Dr. Caryl Comes was concerned with something when he listened to her heart. I spoke with Dr. Caryl Comes and he states he does not remember being concerned with anything unusual. She has verbalized understanding and had no additional questions.

## 2019-06-21 NOTE — Telephone Encounter (Signed)
New message    Pt has a question about when she was here to see Dr. Caryl Comes. She said that Dr. Caryl Comes was listening to her heart and he wasn't actually talking to her but out loud and said not as good. She would like to know what he meant.

## 2019-07-20 ENCOUNTER — Ambulatory Visit (INDEPENDENT_AMBULATORY_CARE_PROVIDER_SITE_OTHER): Payer: PPO | Admitting: *Deleted

## 2019-07-20 DIAGNOSIS — I442 Atrioventricular block, complete: Secondary | ICD-10-CM | POA: Diagnosis not present

## 2019-07-20 DIAGNOSIS — I4891 Unspecified atrial fibrillation: Secondary | ICD-10-CM

## 2019-07-20 LAB — CUP PACEART REMOTE DEVICE CHECK
Battery Remaining Longevity: 122 mo
Battery Remaining Percentage: 95.5 %
Battery Voltage: 3.01 V
Brady Statistic AP VP Percent: 11 %
Brady Statistic AP VS Percent: 1 %
Brady Statistic AS VP Percent: 89 %
Brady Statistic AS VS Percent: 1 %
Brady Statistic RA Percent Paced: 10 %
Brady Statistic RV Percent Paced: 99 %
Date Time Interrogation Session: 20200921060014
Implantable Lead Implant Date: 20181130
Implantable Lead Implant Date: 20181130
Implantable Lead Location: 753859
Implantable Lead Location: 753860
Implantable Pulse Generator Implant Date: 20181130
Lead Channel Impedance Value: 380 Ohm
Lead Channel Impedance Value: 480 Ohm
Lead Channel Pacing Threshold Amplitude: 0.75 V
Lead Channel Pacing Threshold Amplitude: 1 V
Lead Channel Pacing Threshold Pulse Width: 0.5 ms
Lead Channel Pacing Threshold Pulse Width: 0.5 ms
Lead Channel Sensing Intrinsic Amplitude: 3.4 mV
Lead Channel Sensing Intrinsic Amplitude: 9.5 mV
Lead Channel Setting Pacing Amplitude: 1 V
Lead Channel Setting Pacing Amplitude: 2 V
Lead Channel Setting Pacing Pulse Width: 0.5 ms
Lead Channel Setting Sensing Sensitivity: 5 mV
Pulse Gen Model: 2272
Pulse Gen Serial Number: 8969488

## 2019-07-26 ENCOUNTER — Other Ambulatory Visit: Payer: Self-pay | Admitting: Internal Medicine

## 2019-07-30 NOTE — Progress Notes (Signed)
Remote pacemaker transmission.   

## 2019-08-04 IMAGING — CT CT CERVICAL SPINE W/O CM
5 of 8 series · 13 of 33 positions shown, 14 images · non-contrast
Comparison: 04/16/2017

CLINICAL DATA: Fell getting out of the car, striking the right side
of the head with hematoma. Anti coagulated.

EXAM:
CT HEAD WITHOUT CONTRAST
CT CERVICAL SPINE WITHOUT CONTRAST
TECHNIQUE: Multidetector CT imaging of the head and cervical spine was
performed following the standard protocol without intravenous
contrast. Multiplanar CT image reconstructions of the cervical spine
were also generated.

[Series 5: head bone · axial · 0.44mm/px · z∈[+1175,+1227]mm · 2 of 80 slices shown]
[im 27/80  bone]
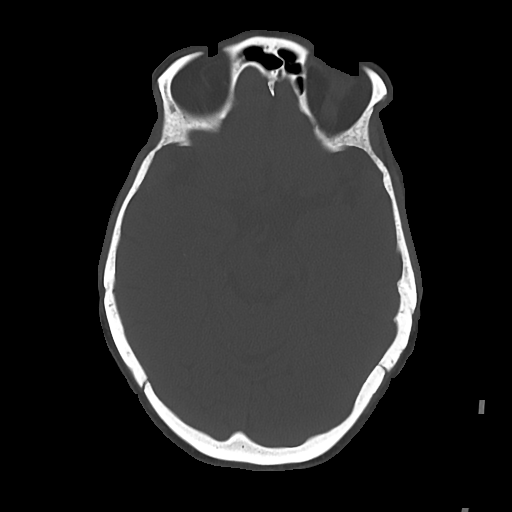
[im 53/80  bone]
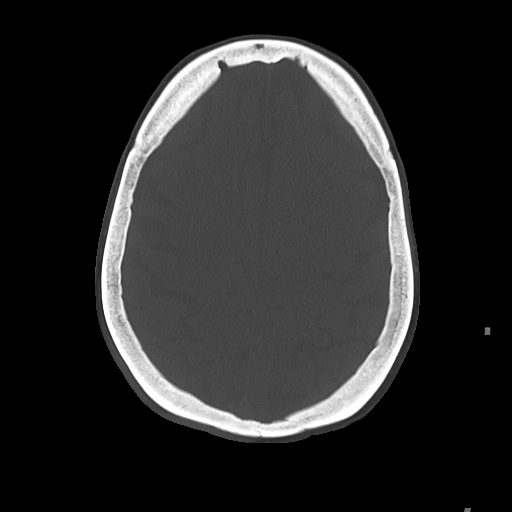

[Series 6: cor soft · coronal · 0.31mm/px · 3 of 67 slices shown]
[im 17/67  bone]
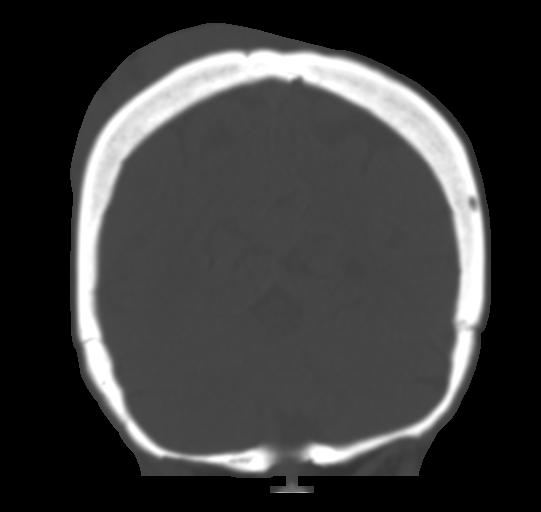
[im 34/67  bone]
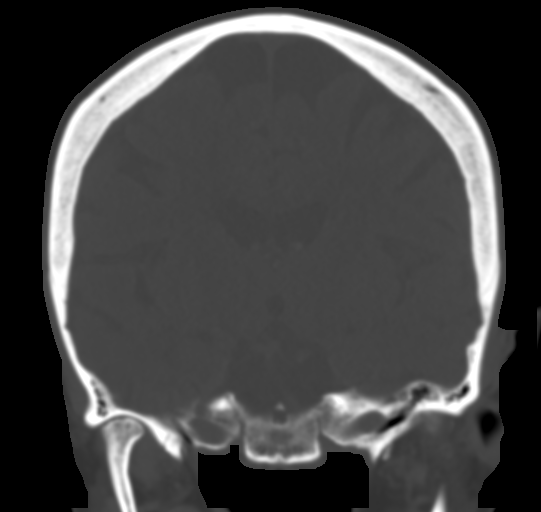
[im 50/67  bone]
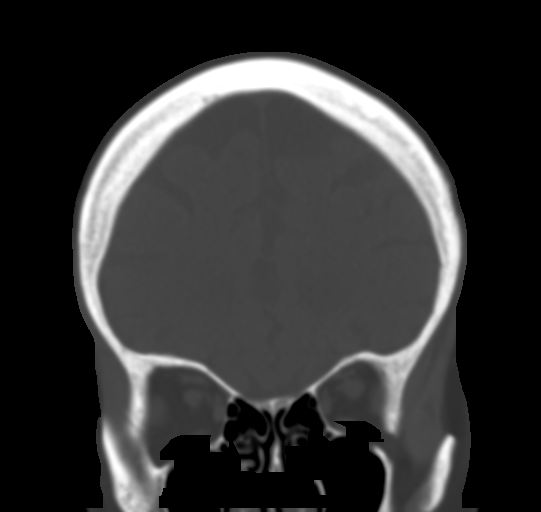

[Series 9: c spine soft · axial · 0.32mm/px · z∈[+1051,+1111]mm · 2 of 91 slices shown]
[im 31/91  soft-tissue]
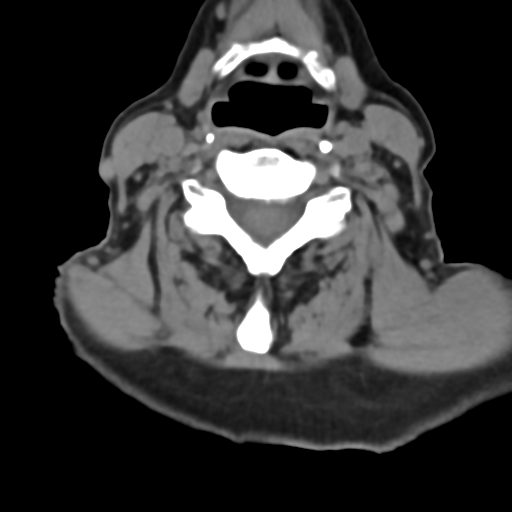
[im 61/91  soft-tissue]
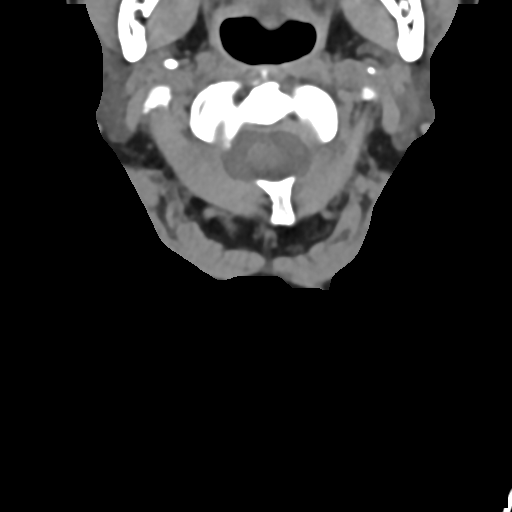

[Series 10: sag bone · sagittal · 0.27mm/px · 4 of 43 slices shown]
[im 9/43  bone]
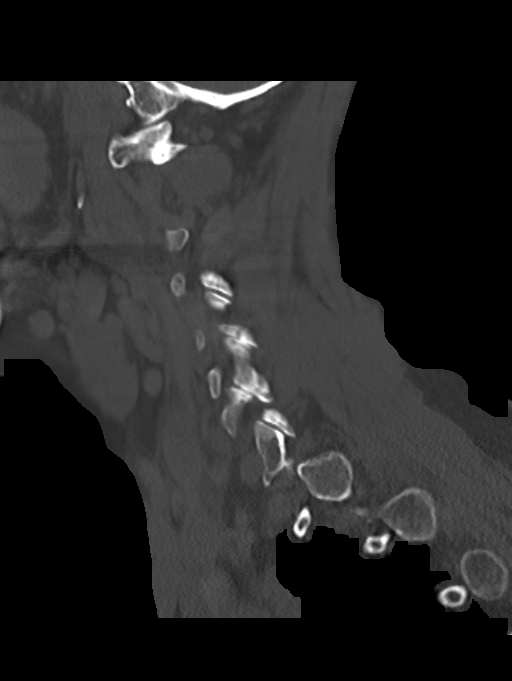
[im 17/43  bone]
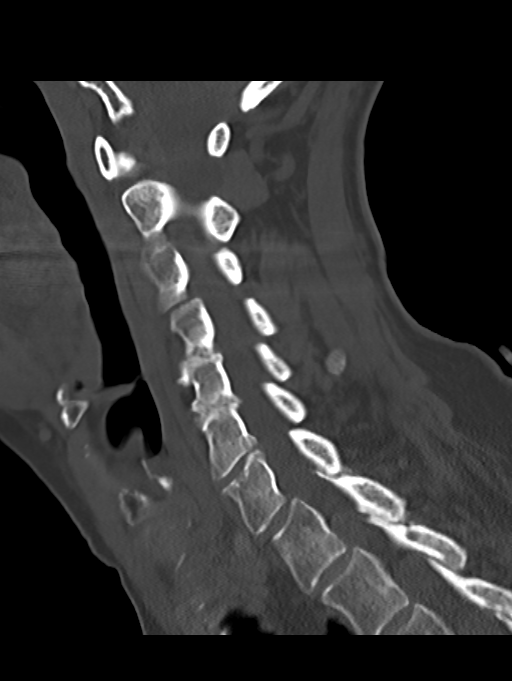
[im 26/43  bone]
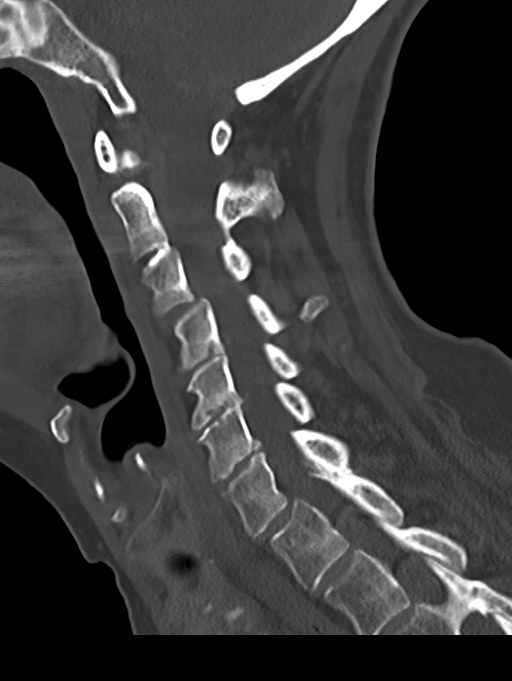
[im 34/43  bone]
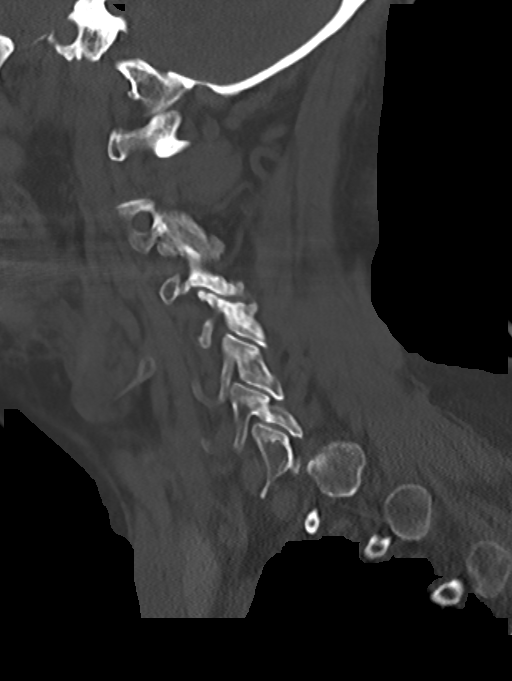

[Series 12: orthogonal axials · axial · 0.21mm/px · z∈[+1020,+1071]mm · 2 of 88 slices shown, 3 images]
[im 30/88  soft-tissue]
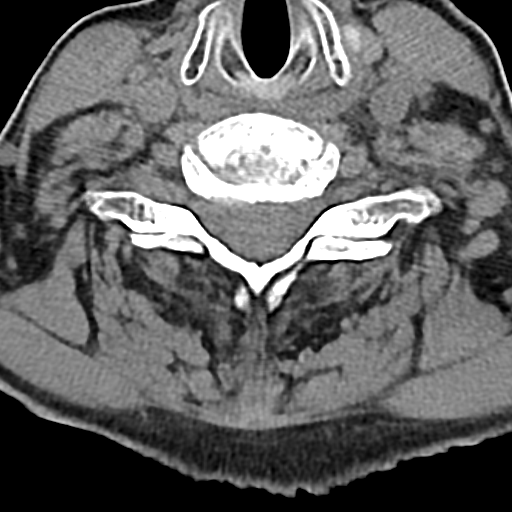
[im 30/88  bone]
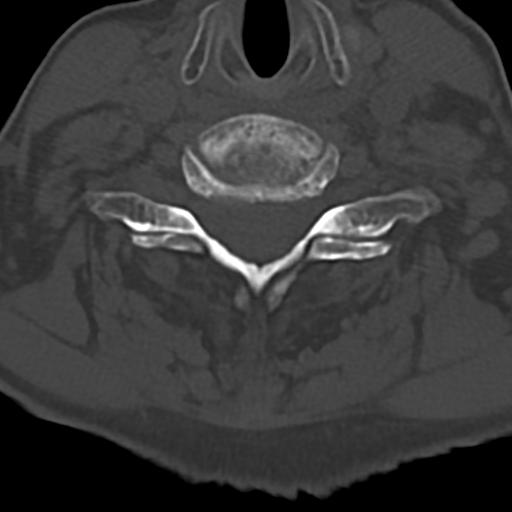
[im 59/88  bone]
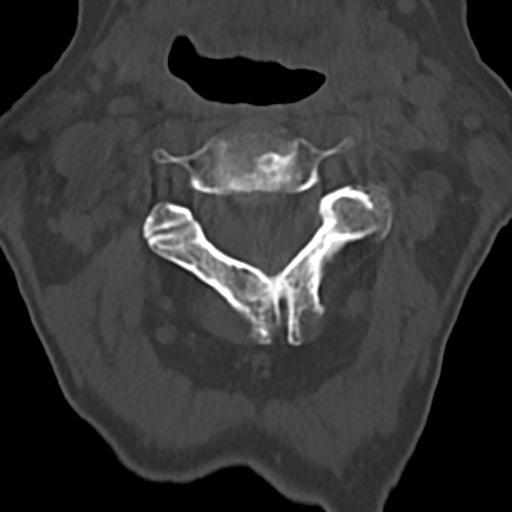

[13 of 33 positions shown; findings below may reference images not displayed]

FINDINGS: CT HEAD FINDINGS

Brain: Generalized atrophy. Chronic small-vessel ischemic changes of
the hemispheric white matter. No sign of acute infarction, mass
lesion, hemorrhage, hydrocephalus or extra-axial collection.

Vascular: There is atherosclerotic calcification of the major
vessels at the base of the brain.

Skull: No skull fracture.

Sinuses/Orbits: Chronic inflammatory changes of the right division
of the sphenoid sinus. Other sinuses clear. Orbits negative.

Other: Right posterior parietal scalp hematoma.

CT CERVICAL SPINE FINDINGS

Alignment: Normal

Skull base and vertebrae: No fracture or focal bone lesion.

Soft tissues and spinal canal: No significant soft tissue finding.

Disc levels: Chronic fusion at C2-3. Ordinary osteoarthritis at
C1-2. Facet osteoarthritis on the right at C7-T1 and T1-2. Facet
osteoarthritis on the left at C3-4 and C7-T1. Degenerative
spondylosis at C4-5, C5-6 and C6-7 with mild osteophytic
encroachment upon the foramina.

Upper chest: Mild chronic scarring.

Other: None significant
IMPRESSION: Head CT: Right posterior parietal scalp hematoma. No underlying
skull fracture or evidence of acute intracranial injury. Chronic
small-vessel ischemic changes.

Cervical spine CT: No acute or traumatic finding. Chronic
degenerative changes as outlined above.

## 2019-08-17 ENCOUNTER — Other Ambulatory Visit: Payer: Self-pay | Admitting: Internal Medicine

## 2019-08-17 NOTE — Telephone Encounter (Signed)
Xarelto 15mg  refill request received. Pt is 85 years old, weight-62.6 kg, Crea-1.12 on 06/14/2019, last seen by Dr. Caryl Comes on 06/14/2019, Diagnosis-Afib, CrCl-34.62ml/min; Dose is appropriate based on dosing criteria. Will send in refill to requested pharmacy.

## 2019-08-23 ENCOUNTER — Other Ambulatory Visit: Payer: Self-pay | Admitting: Internal Medicine

## 2019-08-30 DIAGNOSIS — I480 Paroxysmal atrial fibrillation: Secondary | ICD-10-CM | POA: Diagnosis not present

## 2019-08-30 DIAGNOSIS — Z1389 Encounter for screening for other disorder: Secondary | ICD-10-CM | POA: Diagnosis not present

## 2019-08-30 DIAGNOSIS — D6869 Other thrombophilia: Secondary | ICD-10-CM | POA: Diagnosis not present

## 2019-08-30 DIAGNOSIS — I700 Atherosclerosis of aorta: Secondary | ICD-10-CM | POA: Diagnosis not present

## 2019-08-30 DIAGNOSIS — Z0000 Encounter for general adult medical examination without abnormal findings: Secondary | ICD-10-CM | POA: Diagnosis not present

## 2019-08-30 DIAGNOSIS — I5022 Chronic systolic (congestive) heart failure: Secondary | ICD-10-CM | POA: Diagnosis not present

## 2019-08-30 DIAGNOSIS — I351 Nonrheumatic aortic (valve) insufficiency: Secondary | ICD-10-CM | POA: Diagnosis not present

## 2019-08-30 DIAGNOSIS — Z79899 Other long term (current) drug therapy: Secondary | ICD-10-CM | POA: Diagnosis not present

## 2019-08-30 DIAGNOSIS — I471 Supraventricular tachycardia: Secondary | ICD-10-CM | POA: Diagnosis not present

## 2019-10-18 DIAGNOSIS — Z20828 Contact with and (suspected) exposure to other viral communicable diseases: Secondary | ICD-10-CM | POA: Diagnosis not present

## 2019-10-18 DIAGNOSIS — Z1159 Encounter for screening for other viral diseases: Secondary | ICD-10-CM | POA: Diagnosis not present

## 2019-10-19 ENCOUNTER — Ambulatory Visit (INDEPENDENT_AMBULATORY_CARE_PROVIDER_SITE_OTHER): Payer: PPO | Admitting: *Deleted

## 2019-10-19 DIAGNOSIS — I442 Atrioventricular block, complete: Secondary | ICD-10-CM

## 2019-10-19 LAB — CUP PACEART REMOTE DEVICE CHECK
Battery Remaining Longevity: 122 mo
Battery Remaining Percentage: 95.5 %
Battery Voltage: 3.01 V
Brady Statistic AP VP Percent: 11 %
Brady Statistic AP VS Percent: 1 %
Brady Statistic AS VP Percent: 89 %
Brady Statistic AS VS Percent: 1 %
Brady Statistic RA Percent Paced: 11 %
Brady Statistic RV Percent Paced: 99 %
Date Time Interrogation Session: 20201221020029
Implantable Lead Implant Date: 20181130
Implantable Lead Implant Date: 20181130
Implantable Lead Location: 753859
Implantable Lead Location: 753860
Implantable Pulse Generator Implant Date: 20181130
Lead Channel Impedance Value: 400 Ohm
Lead Channel Impedance Value: 440 Ohm
Lead Channel Pacing Threshold Amplitude: 0.75 V
Lead Channel Pacing Threshold Amplitude: 1 V
Lead Channel Pacing Threshold Pulse Width: 0.5 ms
Lead Channel Pacing Threshold Pulse Width: 0.5 ms
Lead Channel Sensing Intrinsic Amplitude: 2.6 mV
Lead Channel Sensing Intrinsic Amplitude: 9.5 mV
Lead Channel Setting Pacing Amplitude: 1 V
Lead Channel Setting Pacing Amplitude: 2 V
Lead Channel Setting Pacing Pulse Width: 0.5 ms
Lead Channel Setting Sensing Sensitivity: 5 mV
Pulse Gen Model: 2272
Pulse Gen Serial Number: 8969488

## 2019-11-01 DIAGNOSIS — Z20828 Contact with and (suspected) exposure to other viral communicable diseases: Secondary | ICD-10-CM | POA: Diagnosis not present

## 2019-11-01 DIAGNOSIS — Z1159 Encounter for screening for other viral diseases: Secondary | ICD-10-CM | POA: Diagnosis not present

## 2019-11-08 DIAGNOSIS — Z20828 Contact with and (suspected) exposure to other viral communicable diseases: Secondary | ICD-10-CM | POA: Diagnosis not present

## 2019-11-08 DIAGNOSIS — Z1159 Encounter for screening for other viral diseases: Secondary | ICD-10-CM | POA: Diagnosis not present

## 2019-11-11 DIAGNOSIS — Z20828 Contact with and (suspected) exposure to other viral communicable diseases: Secondary | ICD-10-CM | POA: Diagnosis not present

## 2019-11-11 DIAGNOSIS — Z1159 Encounter for screening for other viral diseases: Secondary | ICD-10-CM | POA: Diagnosis not present

## 2019-11-15 DIAGNOSIS — Z20828 Contact with and (suspected) exposure to other viral communicable diseases: Secondary | ICD-10-CM | POA: Diagnosis not present

## 2019-11-15 DIAGNOSIS — Z1159 Encounter for screening for other viral diseases: Secondary | ICD-10-CM | POA: Diagnosis not present

## 2019-11-20 ENCOUNTER — Other Ambulatory Visit: Payer: Self-pay | Admitting: Internal Medicine

## 2019-11-22 DIAGNOSIS — Z20828 Contact with and (suspected) exposure to other viral communicable diseases: Secondary | ICD-10-CM | POA: Diagnosis not present

## 2019-11-22 DIAGNOSIS — Z1159 Encounter for screening for other viral diseases: Secondary | ICD-10-CM | POA: Diagnosis not present

## 2019-11-29 DIAGNOSIS — Z20828 Contact with and (suspected) exposure to other viral communicable diseases: Secondary | ICD-10-CM | POA: Diagnosis not present

## 2019-11-29 DIAGNOSIS — Z1159 Encounter for screening for other viral diseases: Secondary | ICD-10-CM | POA: Diagnosis not present

## 2019-12-20 ENCOUNTER — Other Ambulatory Visit: Payer: Self-pay | Admitting: Internal Medicine

## 2019-12-20 DIAGNOSIS — Z20828 Contact with and (suspected) exposure to other viral communicable diseases: Secondary | ICD-10-CM | POA: Diagnosis not present

## 2019-12-20 DIAGNOSIS — Z1159 Encounter for screening for other viral diseases: Secondary | ICD-10-CM | POA: Diagnosis not present

## 2019-12-23 DIAGNOSIS — Z20828 Contact with and (suspected) exposure to other viral communicable diseases: Secondary | ICD-10-CM | POA: Diagnosis not present

## 2019-12-23 DIAGNOSIS — Z1159 Encounter for screening for other viral diseases: Secondary | ICD-10-CM | POA: Diagnosis not present

## 2020-01-18 ENCOUNTER — Ambulatory Visit (INDEPENDENT_AMBULATORY_CARE_PROVIDER_SITE_OTHER): Payer: PPO | Admitting: Otolaryngology

## 2020-01-18 ENCOUNTER — Encounter (INDEPENDENT_AMBULATORY_CARE_PROVIDER_SITE_OTHER): Payer: Self-pay | Admitting: Otolaryngology

## 2020-01-18 ENCOUNTER — Ambulatory Visit (INDEPENDENT_AMBULATORY_CARE_PROVIDER_SITE_OTHER): Payer: PPO | Admitting: *Deleted

## 2020-01-18 ENCOUNTER — Other Ambulatory Visit: Payer: Self-pay

## 2020-01-18 VITALS — Temp 97.7°F

## 2020-01-18 DIAGNOSIS — H6123 Impacted cerumen, bilateral: Secondary | ICD-10-CM | POA: Diagnosis not present

## 2020-01-18 DIAGNOSIS — I442 Atrioventricular block, complete: Secondary | ICD-10-CM | POA: Diagnosis not present

## 2020-01-18 DIAGNOSIS — H903 Sensorineural hearing loss, bilateral: Secondary | ICD-10-CM | POA: Diagnosis not present

## 2020-01-18 LAB — CUP PACEART REMOTE DEVICE CHECK
Battery Remaining Longevity: 123 mo
Battery Remaining Percentage: 95.5 %
Battery Voltage: 3.01 V
Brady Statistic AP VP Percent: 10 %
Brady Statistic AP VS Percent: 1 %
Brady Statistic AS VP Percent: 90 %
Brady Statistic AS VS Percent: 1 %
Brady Statistic RA Percent Paced: 10 %
Brady Statistic RV Percent Paced: 99 %
Date Time Interrogation Session: 20210323024642
Implantable Lead Implant Date: 20181130
Implantable Lead Implant Date: 20181130
Implantable Lead Location: 753859
Implantable Lead Location: 753860
Implantable Pulse Generator Implant Date: 20181130
Lead Channel Impedance Value: 400 Ohm
Lead Channel Impedance Value: 480 Ohm
Lead Channel Pacing Threshold Amplitude: 0.75 V
Lead Channel Pacing Threshold Amplitude: 1 V
Lead Channel Pacing Threshold Pulse Width: 0.5 ms
Lead Channel Pacing Threshold Pulse Width: 0.5 ms
Lead Channel Sensing Intrinsic Amplitude: 3.1 mV
Lead Channel Sensing Intrinsic Amplitude: 9.5 mV
Lead Channel Setting Pacing Amplitude: 1 V
Lead Channel Setting Pacing Amplitude: 2 V
Lead Channel Setting Pacing Pulse Width: 0.5 ms
Lead Channel Setting Sensing Sensitivity: 5 mV
Pulse Gen Model: 2272
Pulse Gen Serial Number: 8969488

## 2020-01-18 NOTE — Progress Notes (Signed)
HPI: Yvonne [name] is a 86 y.o. female who presents for evaluation of decreased hearing in both ears.  She has a long history of decreased hearing as well as wax buildup in her ears.  She presents today to have her ears cleaned but does not want a hearing test..  Past Medical History:  Diagnosis Date  . Anxiety state, unspecified   . Arthritis   . Cancer (HCC)    basil cell carcinomas removed  . Cataract   . Complete heart block ([place])    a. s/p St. Jude PPM 09/26/17.  Marland Kitchen Esophageal reflux   . Hemorrhage of gastrointestinal tract, unspecified    a. occurred on Aleve  . History of peptic ulcer   . LBBB (left bundle branch block)   . Left ventricular systolic dysfunction    hx of mild  . Mitral valve insufficiency and aortic valve insufficiency    a. Remote echo 2010: mild focal basal hypertrophy of septum, EF 50-55%, mid-distal inferoseptal myocardium, grade 1 DD, LBBB, mild AI/MR, PASP mildly increased.  Marland Kitchen PAF (paroxysmal atrial fibrillation) ([place])   . Paroxysmal supraventricular tachycardia ([place])   . Wears glasses    Past Surgical History:  Procedure Laterality Date  . ABDOMINAL HYSTERECTOMY    . APPENDECTOMY    . BREAST SURGERY     biopsy  . CATARACT EXTRACTION Left   . ESOPHAGOGASTRODUODENOSCOPY    . ESOPHAGOGASTRODUODENOSCOPY (EGD) WITH PROPOFOL N/A 10/10/2015   Procedure: ESOPHAGOGASTRODUODENOSCOPY (EGD) WITH PROPOFOL;  Surgeon: Garlan Fair, MD;  [facility];  Service: Endoscopy;  Laterality: N/A;  . FOOT SURGERY Left    hammer toe and bunion  . KNEE ARTHROSCOPY  2009   left  . OPEN REDUCTION INTERNAL FIXATION (ORIF) METACARPAL Left 11/05/2013   Procedure: CLOSED REDUCTION/PINNING VS OPEN REDUCTION INTERNAL FIXATION (ORIF) LEFT SMALL  METACARPAL FRACTURE ;  Surgeon: Cammie Sickle., MD;  Location: [facility];  Service: Orthopedics;  Laterality: Left;  . ORIF HUMERUS FRACTURE Left 04/10/2015   Procedure: OPEN REDUCTION INTERNAL FIXATION  (ORIF) PROXIMAL HUMERUS FRACTURE;  Surgeon: Marybelle Killings, MD;  Location: [facility];  Service: Orthopedics;  Laterality: Left;  . PACEMAKER IMPLANT N/A 09/26/2017   Procedure: PACEMAKER IMPLANT;  Surgeon: Constance Haw, MD;  Location: [facility];  Service: Cardiovascular;  Laterality: N/A;  . TONSILLECTOMY    . TUBAL LIGATION     Social History   Socioeconomic History  . Marital status: Married    Spouse name: Not on file  . Number of children: Not on file  . Years of education: Not on file  . Highest education level: Not on file  Occupational History  . Not on file  Tobacco Use  . Smoking status: Never Smoker  . Smokeless tobacco: Never Used  Substance and Sexual Activity  . Alcohol use: No  . Drug use: No  . Sexual activity: Not on file  Other Topics Concern  . Not on file  Social History Narrative  . Not on file   Social Determinants of Health   Financial Resource Strain:   . Difficulty of Paying Living Expenses:   Food Insecurity:   . Worried About Charity fundraiser in the Last Year:   . Arboriculturist in the Last Year:   Transportation Needs:   . Film/video editor (Medical):   Marland Kitchen Lack of Transportation (Non-Medical):   Physical Activity:   . Days of Exercise per Week:   .  Minutes of Exercise per Session:   Stress:   . Feeling of Stress :   Social Connections:   . Frequency of Communication with Friends and Family:   . Frequency of Social Gatherings with Friends and Family:   . Attends Religious Services:   . Active Member of Clubs or Organizations:   . Attends Archivist Meetings:   Marland Kitchen Marital Status:    Family History  Problem Relation Age of Onset  . Hypertension Mother   . Heart failure Father   . Stroke Sister   . Heart attack Neg Hx    Allergies  Allergen Reactions  . Aleve [Naproxen Sodium] Other (See Comments)    Gi bleed  . Ambien [Zolpidem] Other (See Comments)    Pt states "made me have crazy thoughts"  .  Flexeril [Cyclobenzaprine] Other (See Comments)    Urinary rentention  . Adhesive [Tape] Rash and Other (See Comments)    Skin redness  . Aspirin Other (See Comments)    Told to avoid high doses due to history of stomach ulcer   Prior to Admission medications   Medication Sig Start Date End Date Taking? Authorizing Provider  acetaminophen (TYLENOL) 500 MG tablet Take 500-1,000 mg by mouth every 6 (six) hours as needed (for pain).   Yes [provider]  amiodarone (PACERONE) 200 MG tablet TAKE ONE-HALF TABLET DAILY 11/22/19  Yes Deboraha Sprang, MD  buPROPion (WELLBUTRIN XL) 300 MG 24 hr tablet Take 1 tablet by mouth daily. 04/12/19  Yes [provider]  carvedilol (COREG) 12.5 MG tablet TAKE ONE TABLET TWICE DAILY WITH A MEAL 07/26/19  Yes Deboraha Sprang, MD  clorazepate (TRANXENE) 7.5 MG tablet Take 7.5 mg by mouth 3 (three) times daily as needed for anxiety.    Yes [provider]  estradiol (ESTRACE) 0.5 MG tablet Take 0.5 mg by mouth daily.   Yes [provider]  fluticasone (FLONASE) 50 MCG/ACT nasal spray Place 2 sprays into both nostrils daily as needed for allergies.  02/25/15  Yes [provider]  furosemide (LASIX) 20 MG tablet TAKE TWO TABLETS EVERY DAY 12/20/19  Yes Deboraha Sprang, MD  ondansetron (ZOFRAN) 8 MG tablet Take 8 mg by mouth every 8 (eight) hours as needed for nausea or vomiting.   Yes [provider]  potassium chloride (K-DUR) 10 MEQ tablet Take 10 mEq by mouth 2 (two) times daily. 11/04/17  Yes [provider]  sodium chloride (OCEAN) 0.65 % SOLN nasal spray Place 2 sprays into both nostrils daily as needed for congestion.   Yes [provider]  XARELTO 15 MG TABS tablet TAKE ONE TABLET DAILY WITH SUPPER 08/17/19  Yes Deboraha Sprang, MD     Positive ROS: Otherwise negative  All other systems have been reviewed and were otherwise negative with the exception of those mentioned in the HPI and as  above.  Physical Exam: Constitutional: Alert, well-appearing, no acute distress Ears: External ears without lesions or tenderness. Ear canals with large amount of obstructing wax in both ear canals.  She has very small ear canals with some mild external otitis on the right side ear canals were cleaned and I applied CSF powder to the right ear.. Nasal: External nose without lesions. Clear nasal passages Oral: Oropharynx clear. Neck: No palpable adenopathy or masses Respiratory: Breathing comfortably  Skin: No facial/neck lesions or rash noted.  Cerumen impaction removal  Date/Time: 01/18/2020 2:09 PM Performed by: Rozetta Nunnery, MD Authorized  by: Rozetta Nunnery, MD   Consent:    Consent obtained:  Verbal   Consent given by:  Patient   Risks discussed:  Pain and bleeding Procedure details:    Location:  L ear and R ear   Procedure type: curette and suction   Post-procedure details:    Inspection:  TM intact and canal normal   Hearing quality:  Improved   Patient tolerance of procedure:  Tolerated well, no immediate complications Comments:     She has some mild inflammatory changes of the skin of the right ear canal.  I applied CSF powder to the right ear only.  She has small ear canals bilaterally.    Assessment: Bilateral cerumen buildup. Bilateral sensorineural hearing loss  Plan: Hearing was much better after cleaning the ears. Recommended follow-up on a yearly basis to have her ears cleaned. She is not interested in having hearing aids.  Radene Journey, MD

## 2020-01-19 NOTE — Progress Notes (Signed)
PPM Remote  

## 2020-01-27 DIAGNOSIS — Z20828 Contact with and (suspected) exposure to other viral communicable diseases: Secondary | ICD-10-CM | POA: Diagnosis not present

## 2020-01-27 DIAGNOSIS — Z1159 Encounter for screening for other viral diseases: Secondary | ICD-10-CM | POA: Diagnosis not present

## 2020-01-31 DIAGNOSIS — Z1159 Encounter for screening for other viral diseases: Secondary | ICD-10-CM | POA: Diagnosis not present

## 2020-01-31 DIAGNOSIS — Z20828 Contact with and (suspected) exposure to other viral communicable diseases: Secondary | ICD-10-CM | POA: Diagnosis not present

## 2020-02-07 DIAGNOSIS — Z1159 Encounter for screening for other viral diseases: Secondary | ICD-10-CM | POA: Diagnosis not present

## 2020-02-07 DIAGNOSIS — Z20828 Contact with and (suspected) exposure to other viral communicable diseases: Secondary | ICD-10-CM | POA: Diagnosis not present

## 2020-02-14 DIAGNOSIS — Z1159 Encounter for screening for other viral diseases: Secondary | ICD-10-CM | POA: Diagnosis not present

## 2020-02-14 DIAGNOSIS — Z20828 Contact with and (suspected) exposure to other viral communicable diseases: Secondary | ICD-10-CM | POA: Diagnosis not present

## 2020-02-24 DIAGNOSIS — Z1159 Encounter for screening for other viral diseases: Secondary | ICD-10-CM | POA: Diagnosis not present

## 2020-02-24 DIAGNOSIS — Z20828 Contact with and (suspected) exposure to other viral communicable diseases: Secondary | ICD-10-CM | POA: Diagnosis not present

## 2020-02-28 DIAGNOSIS — I480 Paroxysmal atrial fibrillation: Secondary | ICD-10-CM | POA: Diagnosis not present

## 2020-02-28 DIAGNOSIS — Z1159 Encounter for screening for other viral diseases: Secondary | ICD-10-CM | POA: Diagnosis not present

## 2020-02-28 DIAGNOSIS — K5901 Slow transit constipation: Secondary | ICD-10-CM | POA: Diagnosis not present

## 2020-02-28 DIAGNOSIS — Z20828 Contact with and (suspected) exposure to other viral communicable diseases: Secondary | ICD-10-CM | POA: Diagnosis not present

## 2020-02-28 DIAGNOSIS — N1831 Chronic kidney disease, stage 3a: Secondary | ICD-10-CM | POA: Diagnosis not present

## 2020-02-28 DIAGNOSIS — D6869 Other thrombophilia: Secondary | ICD-10-CM | POA: Diagnosis not present

## 2020-02-28 DIAGNOSIS — D692 Other nonthrombocytopenic purpura: Secondary | ICD-10-CM | POA: Diagnosis not present

## 2020-02-28 DIAGNOSIS — I700 Atherosclerosis of aorta: Secondary | ICD-10-CM | POA: Diagnosis not present

## 2020-02-28 DIAGNOSIS — I5022 Chronic systolic (congestive) heart failure: Secondary | ICD-10-CM | POA: Diagnosis not present

## 2020-03-02 DIAGNOSIS — Z1159 Encounter for screening for other viral diseases: Secondary | ICD-10-CM | POA: Diagnosis not present

## 2020-03-02 DIAGNOSIS — Z20828 Contact with and (suspected) exposure to other viral communicable diseases: Secondary | ICD-10-CM | POA: Diagnosis not present

## 2020-03-06 DIAGNOSIS — Z1159 Encounter for screening for other viral diseases: Secondary | ICD-10-CM | POA: Diagnosis not present

## 2020-03-06 DIAGNOSIS — Z20828 Contact with and (suspected) exposure to other viral communicable diseases: Secondary | ICD-10-CM | POA: Diagnosis not present

## 2020-03-09 DIAGNOSIS — Z1159 Encounter for screening for other viral diseases: Secondary | ICD-10-CM | POA: Diagnosis not present

## 2020-03-09 DIAGNOSIS — Z20828 Contact with and (suspected) exposure to other viral communicable diseases: Secondary | ICD-10-CM | POA: Diagnosis not present

## 2020-03-13 DIAGNOSIS — Z20828 Contact with and (suspected) exposure to other viral communicable diseases: Secondary | ICD-10-CM | POA: Diagnosis not present

## 2020-03-13 DIAGNOSIS — Z1159 Encounter for screening for other viral diseases: Secondary | ICD-10-CM | POA: Diagnosis not present

## 2020-03-20 DIAGNOSIS — Z20828 Contact with and (suspected) exposure to other viral communicable diseases: Secondary | ICD-10-CM | POA: Diagnosis not present

## 2020-03-20 DIAGNOSIS — Z1159 Encounter for screening for other viral diseases: Secondary | ICD-10-CM | POA: Diagnosis not present

## 2020-03-27 DIAGNOSIS — Z1159 Encounter for screening for other viral diseases: Secondary | ICD-10-CM | POA: Diagnosis not present

## 2020-03-27 DIAGNOSIS — Z20828 Contact with and (suspected) exposure to other viral communicable diseases: Secondary | ICD-10-CM | POA: Diagnosis not present

## 2020-03-30 DIAGNOSIS — Z20828 Contact with and (suspected) exposure to other viral communicable diseases: Secondary | ICD-10-CM | POA: Diagnosis not present

## 2020-03-30 DIAGNOSIS — Z1159 Encounter for screening for other viral diseases: Secondary | ICD-10-CM | POA: Diagnosis not present

## 2020-04-03 DIAGNOSIS — Z20828 Contact with and (suspected) exposure to other viral communicable diseases: Secondary | ICD-10-CM | POA: Diagnosis not present

## 2020-04-03 DIAGNOSIS — Z1159 Encounter for screening for other viral diseases: Secondary | ICD-10-CM | POA: Diagnosis not present

## 2020-04-10 DIAGNOSIS — Z20828 Contact with and (suspected) exposure to other viral communicable diseases: Secondary | ICD-10-CM | POA: Diagnosis not present

## 2020-04-10 DIAGNOSIS — Z1159 Encounter for screening for other viral diseases: Secondary | ICD-10-CM | POA: Diagnosis not present

## 2020-04-13 DIAGNOSIS — Z20828 Contact with and (suspected) exposure to other viral communicable diseases: Secondary | ICD-10-CM | POA: Diagnosis not present

## 2020-04-13 DIAGNOSIS — Z1159 Encounter for screening for other viral diseases: Secondary | ICD-10-CM | POA: Diagnosis not present

## 2020-04-17 DIAGNOSIS — Z1159 Encounter for screening for other viral diseases: Secondary | ICD-10-CM | POA: Diagnosis not present

## 2020-04-17 DIAGNOSIS — Z20828 Contact with and (suspected) exposure to other viral communicable diseases: Secondary | ICD-10-CM | POA: Diagnosis not present

## 2020-04-18 ENCOUNTER — Ambulatory Visit (INDEPENDENT_AMBULATORY_CARE_PROVIDER_SITE_OTHER): Payer: PPO | Admitting: *Deleted

## 2020-04-18 DIAGNOSIS — I442 Atrioventricular block, complete: Secondary | ICD-10-CM

## 2020-04-18 LAB — CUP PACEART REMOTE DEVICE CHECK
Battery Remaining Longevity: 121 mo
Battery Remaining Percentage: 95.5 %
Battery Voltage: 3.01 V
Brady Statistic AP VP Percent: 11 %
Brady Statistic AP VS Percent: 1 %
Brady Statistic AS VP Percent: 89 %
Brady Statistic AS VS Percent: 1 %
Brady Statistic RA Percent Paced: 11 %
Brady Statistic RV Percent Paced: 99 %
Date Time Interrogation Session: 20210622033000
Implantable Lead Implant Date: 20181130
Implantable Lead Implant Date: 20181130
Implantable Lead Location: 753859
Implantable Lead Location: 753860
Implantable Pulse Generator Implant Date: 20181130
Lead Channel Impedance Value: 390 Ohm
Lead Channel Impedance Value: 440 Ohm
Lead Channel Pacing Threshold Amplitude: 0.875 V
Lead Channel Pacing Threshold Amplitude: 1 V
Lead Channel Pacing Threshold Pulse Width: 0.5 ms
Lead Channel Pacing Threshold Pulse Width: 0.5 ms
Lead Channel Sensing Intrinsic Amplitude: 2.7 mV
Lead Channel Sensing Intrinsic Amplitude: 9.5 mV
Lead Channel Setting Pacing Amplitude: 1.125
Lead Channel Setting Pacing Amplitude: 2 V
Lead Channel Setting Pacing Pulse Width: 0.5 ms
Lead Channel Setting Sensing Sensitivity: 5 mV
Pulse Gen Model: 2272
Pulse Gen Serial Number: 8969488

## 2020-04-19 ENCOUNTER — Ambulatory Visit: Payer: PPO | Admitting: Orthopaedic Surgery

## 2020-04-19 NOTE — Progress Notes (Signed)
Remote pacemaker transmission.   

## 2020-04-20 DIAGNOSIS — Z1159 Encounter for screening for other viral diseases: Secondary | ICD-10-CM | POA: Diagnosis not present

## 2020-04-20 DIAGNOSIS — Z20828 Contact with and (suspected) exposure to other viral communicable diseases: Secondary | ICD-10-CM | POA: Diagnosis not present

## 2020-04-24 DIAGNOSIS — Z1159 Encounter for screening for other viral diseases: Secondary | ICD-10-CM | POA: Diagnosis not present

## 2020-04-24 DIAGNOSIS — Z20828 Contact with and (suspected) exposure to other viral communicable diseases: Secondary | ICD-10-CM | POA: Diagnosis not present

## 2020-04-27 DIAGNOSIS — Z1159 Encounter for screening for other viral diseases: Secondary | ICD-10-CM | POA: Diagnosis not present

## 2020-04-27 DIAGNOSIS — Z20828 Contact with and (suspected) exposure to other viral communicable diseases: Secondary | ICD-10-CM | POA: Diagnosis not present

## 2020-05-01 DIAGNOSIS — Z20828 Contact with and (suspected) exposure to other viral communicable diseases: Secondary | ICD-10-CM | POA: Diagnosis not present

## 2020-05-01 DIAGNOSIS — Z1159 Encounter for screening for other viral diseases: Secondary | ICD-10-CM | POA: Diagnosis not present

## 2020-05-08 DIAGNOSIS — Z20828 Contact with and (suspected) exposure to other viral communicable diseases: Secondary | ICD-10-CM | POA: Diagnosis not present

## 2020-05-08 DIAGNOSIS — Z1159 Encounter for screening for other viral diseases: Secondary | ICD-10-CM | POA: Diagnosis not present

## 2020-05-10 ENCOUNTER — Ambulatory Visit: Payer: PPO | Admitting: Orthopaedic Surgery

## 2020-05-10 ENCOUNTER — Encounter: Payer: Self-pay | Admitting: Orthopaedic Surgery

## 2020-05-10 ENCOUNTER — Ambulatory Visit (INDEPENDENT_AMBULATORY_CARE_PROVIDER_SITE_OTHER): Payer: PPO

## 2020-05-10 VITALS — BP 123/84 | HR 63

## 2020-05-10 DIAGNOSIS — M25562 Pain in left knee: Secondary | ICD-10-CM | POA: Diagnosis not present

## 2020-05-10 DIAGNOSIS — G8929 Other chronic pain: Secondary | ICD-10-CM | POA: Diagnosis not present

## 2020-05-10 DIAGNOSIS — M1712 Unilateral primary osteoarthritis, left knee: Secondary | ICD-10-CM

## 2020-05-10 MED ORDER — BUPIVACAINE HCL 0.5 % IJ SOLN
3.0000 mL | INTRAMUSCULAR | Status: AC | PRN
  Administered 2020-05-10: 3 mL via INTRA_ARTICULAR

## 2020-05-10 MED ORDER — LIDOCAINE HCL 1 % IJ SOLN
0.5000 mL | INTRAMUSCULAR | Status: AC | PRN
  Administered 2020-05-10: .5 mL

## 2020-05-10 MED ORDER — METHYLPREDNISOLONE ACETATE 40 MG/ML IJ SUSP
40.0000 mg | INTRAMUSCULAR | Status: AC | PRN
  Administered 2020-05-10: 40 mg via INTRA_ARTICULAR

## 2020-05-10 NOTE — Progress Notes (Signed)
Office Visit Note   Patient: Yvonne [name]           Date of Birth: 04-25-1929           MRN: [id number] Visit Date: 05/10/2020              Requested by: Lajean Manes, MD [address] [place],  [place] [address] PCP: Lajean Manes, MD   Assessment & Plan: Visit Diagnoses:  1. Chronic pain of left knee     Plan: Knee injection performed we reviewed x-rays of her knees today that shows at least moderate knee osteoarthritis marginal osteophytes joint irregularity.  We discussed total knee arthroplasty she will call if she like to consider proceeding or wants to come back and discuss it further.  Follow-Up Instructions: No follow-ups on file.   Orders:  Orders Placed This Encounter  Procedures  . XR KNEE 3 VIEW LEFT   No orders of the defined types were placed in this encounter.     Procedures: Large Joint Inj: L knee on 05/10/2020 11:50 AM Indications: joint swelling and pain Details: 22 G 1.5 in needle, anterolateral approach  Arthrogram: No  Medications: 0.5 mL lidocaine 1 %; 3 mL bupivacaine 0.5 %; 40 mg methylPREDNISolone acetate 40 MG/ML Outcome: tolerated well, no immediate complications Procedure, treatment alternatives, risks and benefits explained, specific risks discussed. Consent was given by the patient. Immediately prior to procedure a time out was called to verify the correct patient, procedure, equipment, support staff and site/side marked as required. Patient was prepped and draped in the usual sterile fashion.       Clinical Data: No additional findings.   Subjective: Chief Complaint  Patient presents with  . Left Knee - Pain    HPI 47 female had some knee arthroscopy by Dr. French Ana and is having progressive increased left knee problems with genu valgum.  In the past cortisone injections and gel injections had helped.  Recently gel injections did not help at all her pains gotten worse she has difficulty have to lift her own leg.   She denies groin pain no associated back pain.  Opposite knee gives her only minimal problems.  She has had some problems with atrial fibrillation systolic heart failure nonischemic cardiomyopathy.  Last ejection fraction was in the 40 to 50% range a few years ago.  Patient states she is not really interested in surgery since it would revolve rehab however need to get a satisfactory result.  Review of Systems 14 point systems noncontributory other than as mentioned in HPI or problem list review.   Objective: Vital Signs: BP 123/84   Pulse 63   Physical Exam Constitutional:      Appearance: She is well-developed.  HENT:     Head: Normocephalic.     Right Ear: External ear normal.     Left Ear: External ear normal.  Eyes:     Pupils: Pupils are equal, round, and reactive to light.  Neck:     Thyroid: No thyromegaly.     Trachea: No tracheal deviation.  Cardiovascular:     Rate and Rhythm: Normal rate.  Pulmonary:     Effort: Pulmonary effort is normal.  Abdominal:     Palpations: Abdomen is soft.  Skin:    General: Skin is warm and dry.  Neurological:     Mental Status: She is alert and oriented to person, place, and time.  Psychiatric:        Behavior: Behavior  normal.     Ortho Exam patient has crepitus with knee range of motion she flexes 120 degrees comes out full extension.  Patient has valgus of her knee with slight circumduction gait due to knee contact.  Distal pulses palpable good capillary refill no pitting edema. Specialty Comments:  No specialty comments available.  Imaging: No results found.   PMFS History: Patient Active Problem List   Diagnosis Date Noted  . PAF (paroxysmal atrial fibrillation) ([place]) 09/27/2017  . Complete heart block ([place]) 09/26/2017  . Hypokalemia 04/29/2016  . NICM (nonischemic cardiomyopathy) ([place]) 04/29/2016  . LBBB (left bundle branch block) 04/29/2016  . Atrial fibrillation with RVR ([place]) 04/25/2016  . Acute systolic congestive  heart failure, NYHA class 3 ([place]) 04/25/2016  . Bilateral pleural effusion 04/25/2016  . Acute respiratory failure with hypoxia ([place]) 04/25/2016  . Positive D dimer 04/25/2016  . Protein calorie malnutrition ([place]) 04/25/2016  . Stage 1 decubitus ulcer/LOW THORACIC spine 04/25/2016  . Physical deconditioning 04/25/2016  . Fracture, humerus, proximal 04/10/2015  . DYSURIA 10/08/2010  . MITRAL REGURGITATION 04/13/2009  . LEFT BUNDLE BRANCH BLOCK 04/13/2009  . GERD 04/13/2009  . Palpitations 04/13/2009  . PEPTIC ULCER DISEASE, HX OF 04/13/2009  . SVT/ PSVT/ PAT 03/26/2009   Past Medical History:  Diagnosis Date  . Anxiety state, unspecified   . Arthritis   . Cancer (HCC)    basil cell carcinomas removed  . Cataract   . Complete heart block ([place])    a. s/p St. Jude PPM 09/26/17.  Marland Kitchen Esophageal reflux   . Hemorrhage of gastrointestinal tract, unspecified    a. occurred on Aleve  . History of peptic ulcer   . LBBB (left bundle branch block)   . Left ventricular systolic dysfunction    hx of mild  . Mitral valve insufficiency and aortic valve insufficiency    a. Remote echo 2010: mild focal basal hypertrophy of septum, EF 50-55%, mid-distal inferoseptal myocardium, grade 1 DD, LBBB, mild AI/MR, PASP mildly increased.  Marland Kitchen PAF (paroxysmal atrial fibrillation) ([place])   . Paroxysmal supraventricular tachycardia ([place])   . Wears glasses     Family History  Problem Relation Age of Onset  . Hypertension Mother   . Heart failure Father   . Stroke Sister   . Heart attack Neg Hx     Past Surgical History:  Procedure Laterality Date  . ABDOMINAL HYSTERECTOMY    . APPENDECTOMY    . BREAST SURGERY     biopsy  . CATARACT EXTRACTION Left   . ESOPHAGOGASTRODUODENOSCOPY    . ESOPHAGOGASTRODUODENOSCOPY (EGD) WITH PROPOFOL N/A 10/10/2015   Procedure: ESOPHAGOGASTRODUODENOSCOPY (EGD) WITH PROPOFOL;  Surgeon: Garlan Fair, MD;  Location: [facility];  Service: Endoscopy;  Laterality: N/A;    . FOOT SURGERY Left    hammer toe and bunion  . KNEE ARTHROSCOPY  2009   left  . OPEN REDUCTION INTERNAL FIXATION (ORIF) METACARPAL Left 11/05/2013   Procedure: CLOSED REDUCTION/PINNING VS OPEN REDUCTION INTERNAL FIXATION (ORIF) LEFT SMALL  METACARPAL FRACTURE ;  Surgeon: Cammie Sickle., MD;  Location: [facility];  Service: Orthopedics;  Laterality: Left;  . ORIF HUMERUS FRACTURE Left 04/10/2015   Procedure: OPEN REDUCTION INTERNAL FIXATION (ORIF) PROXIMAL HUMERUS FRACTURE;  Surgeon: Marybelle Killings, MD;  Location: [facility];  Service: Orthopedics;  Laterality: Left;  . PACEMAKER IMPLANT N/A 09/26/2017   Procedure: PACEMAKER IMPLANT;  Surgeon: Constance Haw, MD;  Location: [facility];  Service: Cardiovascular;  Laterality: N/A;  . TONSILLECTOMY    . TUBAL LIGATION     Social History   Occupational History  . Not on file  Tobacco Use  . Smoking status: Never Smoker  . Smokeless tobacco: Never Used  Substance and Sexual Activity  . Alcohol use: No  . Drug use: No  . Sexual activity: Not on file

## 2020-05-15 DIAGNOSIS — Z20828 Contact with and (suspected) exposure to other viral communicable diseases: Secondary | ICD-10-CM | POA: Diagnosis not present

## 2020-05-15 DIAGNOSIS — Z1159 Encounter for screening for other viral diseases: Secondary | ICD-10-CM | POA: Diagnosis not present

## 2020-05-16 ENCOUNTER — Other Ambulatory Visit: Payer: Self-pay | Admitting: Internal Medicine

## 2020-05-22 DIAGNOSIS — Z20828 Contact with and (suspected) exposure to other viral communicable diseases: Secondary | ICD-10-CM | POA: Diagnosis not present

## 2020-05-22 DIAGNOSIS — Z1159 Encounter for screening for other viral diseases: Secondary | ICD-10-CM | POA: Diagnosis not present

## 2020-05-29 DIAGNOSIS — Z1159 Encounter for screening for other viral diseases: Secondary | ICD-10-CM | POA: Diagnosis not present

## 2020-05-29 DIAGNOSIS — Z20828 Contact with and (suspected) exposure to other viral communicable diseases: Secondary | ICD-10-CM | POA: Diagnosis not present

## 2020-06-05 DIAGNOSIS — Z20828 Contact with and (suspected) exposure to other viral communicable diseases: Secondary | ICD-10-CM | POA: Diagnosis not present

## 2020-06-05 DIAGNOSIS — Z1159 Encounter for screening for other viral diseases: Secondary | ICD-10-CM | POA: Diagnosis not present

## 2020-06-09 DIAGNOSIS — H0100B Unspecified blepharitis left eye, upper and lower eyelids: Secondary | ICD-10-CM | POA: Diagnosis not present

## 2020-06-09 DIAGNOSIS — H524 Presbyopia: Secondary | ICD-10-CM | POA: Diagnosis not present

## 2020-06-09 DIAGNOSIS — Z961 Presence of intraocular lens: Secondary | ICD-10-CM | POA: Diagnosis not present

## 2020-06-09 DIAGNOSIS — H0100A Unspecified blepharitis right eye, upper and lower eyelids: Secondary | ICD-10-CM | POA: Diagnosis not present

## 2020-06-12 DIAGNOSIS — Z1159 Encounter for screening for other viral diseases: Secondary | ICD-10-CM | POA: Diagnosis not present

## 2020-06-12 DIAGNOSIS — Z20828 Contact with and (suspected) exposure to other viral communicable diseases: Secondary | ICD-10-CM | POA: Diagnosis not present

## 2020-06-15 DIAGNOSIS — Z1159 Encounter for screening for other viral diseases: Secondary | ICD-10-CM | POA: Diagnosis not present

## 2020-06-15 DIAGNOSIS — Z20828 Contact with and (suspected) exposure to other viral communicable diseases: Secondary | ICD-10-CM | POA: Diagnosis not present

## 2020-06-19 DIAGNOSIS — Z1159 Encounter for screening for other viral diseases: Secondary | ICD-10-CM | POA: Diagnosis not present

## 2020-06-19 DIAGNOSIS — Z20828 Contact with and (suspected) exposure to other viral communicable diseases: Secondary | ICD-10-CM | POA: Diagnosis not present

## 2020-06-27 ENCOUNTER — Other Ambulatory Visit: Payer: Self-pay | Admitting: Internal Medicine

## 2020-06-27 NOTE — Telephone Encounter (Signed)
Pt last saw Dr Caryl Comes 06/14/19, pt is due this month for follow-up.  Last labs 02/28/20 Creat 1.14 at [facility] per [facility], age 86, weight 62.6kg, CrCl 33.06, based on CrCl pt is on appropriate dosage of Xarelto 15mg  QD.  Will refill rx with note on rx must see MD for future refills.

## 2020-06-29 DIAGNOSIS — Z20828 Contact with and (suspected) exposure to other viral communicable diseases: Secondary | ICD-10-CM | POA: Diagnosis not present

## 2020-06-29 DIAGNOSIS — Z1159 Encounter for screening for other viral diseases: Secondary | ICD-10-CM | POA: Diagnosis not present

## 2020-07-10 DIAGNOSIS — Z20828 Contact with and (suspected) exposure to other viral communicable diseases: Secondary | ICD-10-CM | POA: Diagnosis not present

## 2020-07-10 DIAGNOSIS — Z1159 Encounter for screening for other viral diseases: Secondary | ICD-10-CM | POA: Diagnosis not present

## 2020-07-13 DIAGNOSIS — Z1159 Encounter for screening for other viral diseases: Secondary | ICD-10-CM | POA: Diagnosis not present

## 2020-07-13 DIAGNOSIS — Z20828 Contact with and (suspected) exposure to other viral communicable diseases: Secondary | ICD-10-CM | POA: Diagnosis not present

## 2020-07-17 DIAGNOSIS — Z20828 Contact with and (suspected) exposure to other viral communicable diseases: Secondary | ICD-10-CM | POA: Diagnosis not present

## 2020-07-17 DIAGNOSIS — Z1159 Encounter for screening for other viral diseases: Secondary | ICD-10-CM | POA: Diagnosis not present

## 2020-07-18 ENCOUNTER — Ambulatory Visit (INDEPENDENT_AMBULATORY_CARE_PROVIDER_SITE_OTHER): Payer: PPO | Admitting: *Deleted

## 2020-07-18 DIAGNOSIS — I442 Atrioventricular block, complete: Secondary | ICD-10-CM

## 2020-07-18 LAB — CUP PACEART REMOTE DEVICE CHECK
Battery Remaining Longevity: 123 mo
Battery Remaining Percentage: 95.5 %
Battery Voltage: 3.01 V
Brady Statistic AP VP Percent: 10 %
Brady Statistic AP VS Percent: 1 %
Brady Statistic AS VP Percent: 90 %
Brady Statistic AS VS Percent: 1 %
Brady Statistic RA Percent Paced: 10 %
Brady Statistic RV Percent Paced: 99 %
Date Time Interrogation Session: 20210921020017
Implantable Lead Implant Date: 20181130
Implantable Lead Implant Date: 20181130
Implantable Lead Location: 753859
Implantable Lead Location: 753860
Implantable Pulse Generator Implant Date: 20181130
Lead Channel Impedance Value: 380 Ohm
Lead Channel Impedance Value: 480 Ohm
Lead Channel Pacing Threshold Amplitude: 0.625 V
Lead Channel Pacing Threshold Amplitude: 1 V
Lead Channel Pacing Threshold Pulse Width: 0.5 ms
Lead Channel Pacing Threshold Pulse Width: 0.5 ms
Lead Channel Sensing Intrinsic Amplitude: 3.1 mV
Lead Channel Sensing Intrinsic Amplitude: 9.5 mV
Lead Channel Setting Pacing Amplitude: 0.875
Lead Channel Setting Pacing Amplitude: 2 V
Lead Channel Setting Pacing Pulse Width: 0.5 ms
Lead Channel Setting Sensing Sensitivity: 5 mV
Pulse Gen Model: 2272
Pulse Gen Serial Number: 8969488

## 2020-07-19 NOTE — Progress Notes (Signed)
Remote pacemaker transmission.   

## 2020-07-24 DIAGNOSIS — Z1159 Encounter for screening for other viral diseases: Secondary | ICD-10-CM | POA: Diagnosis not present

## 2020-07-24 DIAGNOSIS — Z20828 Contact with and (suspected) exposure to other viral communicable diseases: Secondary | ICD-10-CM | POA: Diagnosis not present

## 2020-07-31 DIAGNOSIS — Z20828 Contact with and (suspected) exposure to other viral communicable diseases: Secondary | ICD-10-CM | POA: Diagnosis not present

## 2020-07-31 DIAGNOSIS — Z1159 Encounter for screening for other viral diseases: Secondary | ICD-10-CM | POA: Diagnosis not present

## 2020-08-01 ENCOUNTER — Other Ambulatory Visit: Payer: Self-pay | Admitting: Internal Medicine

## 2020-08-07 DIAGNOSIS — Z20828 Contact with and (suspected) exposure to other viral communicable diseases: Secondary | ICD-10-CM | POA: Diagnosis not present

## 2020-08-07 DIAGNOSIS — Z1159 Encounter for screening for other viral diseases: Secondary | ICD-10-CM | POA: Diagnosis not present

## 2020-08-14 DIAGNOSIS — Z1159 Encounter for screening for other viral diseases: Secondary | ICD-10-CM | POA: Diagnosis not present

## 2020-08-14 DIAGNOSIS — Z20828 Contact with and (suspected) exposure to other viral communicable diseases: Secondary | ICD-10-CM | POA: Diagnosis not present

## 2020-08-21 DIAGNOSIS — Z1159 Encounter for screening for other viral diseases: Secondary | ICD-10-CM | POA: Diagnosis not present

## 2020-08-21 DIAGNOSIS — Z20828 Contact with and (suspected) exposure to other viral communicable diseases: Secondary | ICD-10-CM | POA: Diagnosis not present

## 2020-08-28 ENCOUNTER — Other Ambulatory Visit: Payer: Self-pay | Admitting: Internal Medicine

## 2020-08-28 DIAGNOSIS — Z1159 Encounter for screening for other viral diseases: Secondary | ICD-10-CM | POA: Diagnosis not present

## 2020-08-28 DIAGNOSIS — Z20828 Contact with and (suspected) exposure to other viral communicable diseases: Secondary | ICD-10-CM | POA: Diagnosis not present

## 2020-08-28 NOTE — Telephone Encounter (Signed)
Xarelto 15mg  refill request received. Pt is 86 years old, weight-62.6kg, Crea-1.14 on 03/04/2020 via [facility] at [place], last seen by Dr. Caryl Comes on 06/14/2019, Diagnosis-Afib, CrCl-39.33ml/min; Dose is appropriate based on dosing criteria. Will send in refill to requested pharmacy.

## 2020-09-05 ENCOUNTER — Other Ambulatory Visit: Payer: Self-pay | Admitting: Internal Medicine

## 2020-09-11 DIAGNOSIS — Z1159 Encounter for screening for other viral diseases: Secondary | ICD-10-CM | POA: Diagnosis not present

## 2020-09-11 DIAGNOSIS — Z20828 Contact with and (suspected) exposure to other viral communicable diseases: Secondary | ICD-10-CM | POA: Diagnosis not present

## 2020-09-18 DIAGNOSIS — Z20828 Contact with and (suspected) exposure to other viral communicable diseases: Secondary | ICD-10-CM | POA: Diagnosis not present

## 2020-09-18 DIAGNOSIS — Z1159 Encounter for screening for other viral diseases: Secondary | ICD-10-CM | POA: Diagnosis not present

## 2020-09-25 DIAGNOSIS — Z1159 Encounter for screening for other viral diseases: Secondary | ICD-10-CM | POA: Diagnosis not present

## 2020-09-25 DIAGNOSIS — Z20828 Contact with and (suspected) exposure to other viral communicable diseases: Secondary | ICD-10-CM | POA: Diagnosis not present

## 2020-09-28 DIAGNOSIS — Z20828 Contact with and (suspected) exposure to other viral communicable diseases: Secondary | ICD-10-CM | POA: Diagnosis not present

## 2020-09-28 DIAGNOSIS — Z1159 Encounter for screening for other viral diseases: Secondary | ICD-10-CM | POA: Diagnosis not present

## 2020-10-02 DIAGNOSIS — Z20828 Contact with and (suspected) exposure to other viral communicable diseases: Secondary | ICD-10-CM | POA: Diagnosis not present

## 2020-10-02 DIAGNOSIS — Z1159 Encounter for screening for other viral diseases: Secondary | ICD-10-CM | POA: Diagnosis not present

## 2020-10-09 DIAGNOSIS — Z1159 Encounter for screening for other viral diseases: Secondary | ICD-10-CM | POA: Diagnosis not present

## 2020-10-09 DIAGNOSIS — Z20828 Contact with and (suspected) exposure to other viral communicable diseases: Secondary | ICD-10-CM | POA: Diagnosis not present

## 2020-10-16 DIAGNOSIS — Z1159 Encounter for screening for other viral diseases: Secondary | ICD-10-CM | POA: Diagnosis not present

## 2020-10-16 DIAGNOSIS — Z20828 Contact with and (suspected) exposure to other viral communicable diseases: Secondary | ICD-10-CM | POA: Diagnosis not present

## 2020-10-18 ENCOUNTER — Ambulatory Visit (INDEPENDENT_AMBULATORY_CARE_PROVIDER_SITE_OTHER): Payer: PPO

## 2020-10-18 DIAGNOSIS — I442 Atrioventricular block, complete: Secondary | ICD-10-CM | POA: Diagnosis not present

## 2020-10-22 LAB — CUP PACEART REMOTE DEVICE CHECK
Battery Remaining Longevity: 121 mo
Battery Remaining Percentage: 95.5 %
Battery Voltage: 3.01 V
Brady Statistic AP VP Percent: 10 %
Brady Statistic AP VS Percent: 1 %
Brady Statistic AS VP Percent: 90 %
Brady Statistic AS VS Percent: 1 %
Brady Statistic RA Percent Paced: 10 %
Brady Statistic RV Percent Paced: 99 %
Date Time Interrogation Session: 20211222020022
Implantable Lead Implant Date: 20181130
Implantable Lead Implant Date: 20181130
Implantable Lead Location: 753859
Implantable Lead Location: 753860
Implantable Pulse Generator Implant Date: 20181130
Lead Channel Impedance Value: 410 Ohm
Lead Channel Impedance Value: 480 Ohm
Lead Channel Pacing Threshold Amplitude: 0.875 V
Lead Channel Pacing Threshold Amplitude: 1 V
Lead Channel Pacing Threshold Pulse Width: 0.5 ms
Lead Channel Pacing Threshold Pulse Width: 0.5 ms
Lead Channel Sensing Intrinsic Amplitude: 3.6 mV
Lead Channel Sensing Intrinsic Amplitude: 9.5 mV
Lead Channel Setting Pacing Amplitude: 1.125
Lead Channel Setting Pacing Amplitude: 2 V
Lead Channel Setting Pacing Pulse Width: 0.5 ms
Lead Channel Setting Sensing Sensitivity: 5 mV
Pulse Gen Model: 2272
Pulse Gen Serial Number: 8969488

## 2020-10-22 NOTE — Progress Notes (Signed)
Cardiology Office Note Date:  10/22/2020  Patient ID:  Yvonne [name], Yvonne [name] [birth year], MRN [id number] PCP:  Lajean Manes, MD  Cardiologist/Electrophysiologist: Dr. Caryl Comes   Chief Complaint: over due visit  History of Present Illness: Yvonne [name] is a 86 y.o. female with history of LBBB, GERD, CHB w/PPM, AFib, NICM,   She comes in today to be seen for Dr. Caryl Comes, last seen by him Aug 2020, at that time mentioned h/o falls though none recently, device A sensitivity adjusted to see her AF.  TODAY She is doing pretty well. Lives in independent living at [facility].  She ambulates with a walker, though does not use it inside and does fall. She says her apartment is small and the walker a bit big/bulky to get around with inside. When asked if she falls outside when using her walker she says no. She is vague abut the mechanism of her falls, though is certain is not dizziness or fainting. She mentions that the foyer is problematic for her and thinks she tends t take that corner to fast. She says she has fallen quite a few times, but not daily or even weekly, maybe a couple ties a month.  Denies injuries, once scraped her fingers up pretyy bad trying to catch herself.  No CP, palpitations or cardiac awareness, no near syncope or syncope No SOB, DOE Denies orthostatic symptoms No bleeding or signs of bleeding   Device information SJM dual chamber PPM implanted 09/26/2017  AAD Amiodarone started 2017   Past Medical History:  Diagnosis Date  . Anxiety state, unspecified   . Arthritis   . Cancer (HCC)    basil cell carcinomas removed  . Cataract   . Complete heart block ([place])    a. s/p St. Jude PPM 09/26/17.  Marland Kitchen Esophageal reflux   . Hemorrhage of gastrointestinal tract, unspecified    a. occurred on Aleve  . History of peptic ulcer   . LBBB (left bundle branch block)   . Left ventricular systolic dysfunction    hx of mild  . Mitral valve insufficiency and aortic valve  insufficiency    a. Remote echo 2010: mild focal basal hypertrophy of septum, EF 50-55%, mid-distal inferoseptal myocardium, grade 1 DD, LBBB, mild AI/MR, PASP mildly increased.  Marland Kitchen PAF (paroxysmal atrial fibrillation) ([place])   . Paroxysmal supraventricular tachycardia ([place])   . Wears glasses     Past Surgical History:  Procedure Laterality Date  . ABDOMINAL HYSTERECTOMY    . APPENDECTOMY    . BREAST SURGERY     biopsy  . CATARACT EXTRACTION Left   . ESOPHAGOGASTRODUODENOSCOPY    . ESOPHAGOGASTRODUODENOSCOPY (EGD) WITH PROPOFOL N/A 10/10/2015   Procedure: ESOPHAGOGASTRODUODENOSCOPY (EGD) WITH PROPOFOL;  Surgeon: Garlan Fair, MD;  [facility];  Service: Endoscopy;  Laterality: N/A;  . FOOT SURGERY Left    hammer toe and bunion  . KNEE ARTHROSCOPY  2009   left  . OPEN REDUCTION INTERNAL FIXATION (ORIF) METACARPAL Left 11/05/2013   Procedure: CLOSED REDUCTION/PINNING VS OPEN REDUCTION INTERNAL FIXATION (ORIF) LEFT SMALL  METACARPAL FRACTURE ;  Surgeon: Cammie Sickle., MD;  Location: [facility];  Service: Orthopedics;  Laterality: Left;  . ORIF HUMERUS FRACTURE Left 04/10/2015   Procedure: OPEN REDUCTION INTERNAL FIXATION (ORIF) PROXIMAL HUMERUS FRACTURE;  Surgeon: Marybelle Killings, MD;  Location: [facility];  Service: Orthopedics;  Laterality: Left;  . PACEMAKER IMPLANT N/A 09/26/2017   Procedure: PACEMAKER IMPLANT;  Surgeon: Constance Haw,  MD;  Location: [facility];  Service: Cardiovascular;  Laterality: N/A;  . TONSILLECTOMY    . TUBAL LIGATION      Current Outpatient Medications  Medication Sig Dispense Refill  . acetaminophen (TYLENOL) 500 MG tablet Take 500-1,000 mg by mouth every 6 (six) hours as needed (for pain).    Marland Kitchen amiodarone (PACERONE) 200 MG tablet TAKE ONE-HALF TABLET DAILY 45 tablet 1  . buPROPion (WELLBUTRIN XL) 300 MG 24 hr tablet Take 1 tablet by mouth daily.    . carvedilol (COREG) 12.5 MG tablet Take 1 tablet (12.5 mg total) by  mouth 2 (two) times daily with a meal. Please keep upcoming appt in December before anymore refills. Thank you 60 tablet 1  . clorazepate (TRANXENE) 7.5 MG tablet Take 7.5 mg by mouth 3 (three) times daily as needed for anxiety.     Marland Kitchen estradiol (ESTRACE) 0.5 MG tablet Take 0.5 mg by mouth daily.    . fluticasone (FLONASE) 50 MCG/ACT nasal spray Place 2 sprays into both nostrils daily as needed for allergies.   5  . furosemide (LASIX) 20 MG tablet TAKE TWO TABLETS EVERY DAY 180 tablet 3  . ondansetron (ZOFRAN) 8 MG tablet Take 8 mg by mouth every 8 (eight) hours as needed for nausea or vomiting.    . potassium chloride (K-DUR) 10 MEQ tablet Take 10 mEq by mouth 2 (two) times daily.    . sodium chloride (OCEAN) 0.65 % SOLN nasal spray Place 2 sprays into both nostrils daily as needed for congestion.    Alveda Reasons 15 MG TABS tablet TAKE ONE TABLET DAILY WITH SUPPER 30 tablet 1   No current facility-administered medications for this visit.    Allergies:   Aleve [naproxen sodium], Ambien [zolpidem], Flexeril [cyclobenzaprine], Adhesive [tape], and Aspirin   Social History:  The patient  reports that she has never smoked. She has never used smokeless tobacco. She reports that she does not drink alcohol and does not use drugs.   Family History:  The patient's family history includes Heart failure in her father; Hypertension in her mother; Stroke in her sister.  ROS:  Please see the history of present illness.    All other systems are reviewed and otherwise negative.   PHYSICAL EXAM:  VS:  There were no vitals taken for this visit. BMI: There is no height or weight on file to calculate BMI. Well nourished, well developed, in no acute distress HEENT: normocephalic, atraumatic Neck: no JVD, carotid bruits or masses Cardiac:  RRR; no significant murmurs, no rubs, or gallops Lungs:  CTA b/l, no wheezing, rhonchi or rales Abd: soft, nontender MS: no deformity, age appropriate atrophy Ext:  no  edema Skin: warm and dry, no rash Neuro:  No gross deficits appreciated Psych: euthymic mood, full affect  PPM site is stable, no tethering or discomfort   EKG:  Done today and reviewed by myself shows  SR/ Vpaced 75  Device interrogation done today and reviewed by myself:  Battery and lead measurements are good >99%VP 10% AP No R waves today at 40 53AMS episodes, all EGMS available are reviewed, are Aflutter, longest 3 hours   09/03/2016: TTE Study Conclusions  - Left ventricle: The cavity size was mildly dilated. Systolic  function was mildly to moderately reduced. The estimated ejection  fraction was in the range of 40% to 45%. Diffuse hypokinesis.  There was an increased relative contribution of atrial  contraction to ventricular filling. Doppler parameters are  consistent  with abnormal left ventricular relaxation (grade 1  diastolic dysfunction).  - Ventricular septum: Septal motion showed moderate paradox. These  changes are consistent with intraventricular conduction delay.  - Aortic valve: There was mild regurgitation.  - Mitral valve: Calcified annulus. There was mild to moderate  regurgitation.  - Atrial septum: There was increased thickness of the septum,  consistent with lipomatous hypertrophy.  - Tricuspid valve: There was mild regurgitation.  - Pulmonary arteries: PA peak pressure: 37 mm Hg (S).     Recent Labs: No results found for requested labs within last 8760 hours.  No results found for requested labs within last 8760 hours.   CrCl cannot be calculated (Patient's most recent lab result is older than the maximum 21 days allowed.).   Wt Readings from Last 3 Encounters:  06/14/19 138 lb (62.6 kg)  01/19/18 136 lb (61.7 kg)  12/30/17 138 lb (62.6 kg)     Other studies reviewed: Additional studies/records reviewed today include: summarized above  ASSESSMENT AND PLAN:  1. PPM     Intact function, no programming changes  made  2. Paroxysmal Afib     CHA2DS2Vasc is 4, on Xarelto,  appropriately dosed by last labs     <1% burden     On low dose amio     Labs today  3. HTN     Looks good, no changes  4. VHD     Mild-mod MR by last echo     No symptoms to suggest change   5. Falls     We discussed at length falls and her safety and urged her to use her walker at all times, slow down and pay close attentin to what she is doing and her environment.   Disposition: F/u with remotes as usual and in clinic in 41mo, sooner if neeed  Current medicines are reviewed at length with the patient today.  The patient did not have any concerns regarding medicines.  Norma Fredrickson, PA-C 10/22/2020 12:19 PM     [facility] [address] [place] [address] [phone] (office)  [phone] (fax)

## 2020-10-23 ENCOUNTER — Encounter: Payer: Self-pay | Admitting: Physician Assistant

## 2020-10-23 ENCOUNTER — Ambulatory Visit: Payer: PPO | Admitting: Physician Assistant

## 2020-10-23 ENCOUNTER — Other Ambulatory Visit: Payer: Self-pay

## 2020-10-23 VITALS — BP 120/70 | HR 75 | Ht 67.0 in | Wt 129.0 lb

## 2020-10-23 DIAGNOSIS — I442 Atrioventricular block, complete: Secondary | ICD-10-CM

## 2020-10-23 DIAGNOSIS — Z950 Presence of cardiac pacemaker: Secondary | ICD-10-CM | POA: Diagnosis not present

## 2020-10-23 DIAGNOSIS — I10 Essential (primary) hypertension: Secondary | ICD-10-CM | POA: Diagnosis not present

## 2020-10-23 DIAGNOSIS — I480 Paroxysmal atrial fibrillation: Secondary | ICD-10-CM | POA: Diagnosis not present

## 2020-10-23 DIAGNOSIS — Z79899 Other long term (current) drug therapy: Secondary | ICD-10-CM

## 2020-10-23 NOTE — Patient Instructions (Addendum)
Medication Instructions:    Your physician recommends that you continue on your current medications as directed. Please refer to the Current Medication list given to you today.  *If you need a refill on your cardiac medications before your next appointment, please call your pharmacy*   Lab Work:  CMET CBC AND TSH TODAY    If you have labs (blood work) drawn today and your tests are completely normal, you will receive your results only by: Marland Kitchen MyChart Message (if you have MyChart) OR . A paper copy in the mail If you have any lab test that is abnormal or we need to change your treatment, we will call you to review the results.   Testing/Procedures:    Follow-Up: At [facility], you and your health needs are our priority.  As part of our continuing mission to provide you with exceptional heart care, we have created designated Provider Care Teams.  These Care Teams include your primary Cardiologist (physician) and Advanced Practice Providers (APPs -  Physician Assistants and Nurse Practitioners) who all work together to provide you with the care you need, when you need it.  We recommend signing up for the patient portal called "MyChart".  Sign up information is provided on this After Visit Summary.  MyChart is used to connect with patients for Virtual Visits (Telemedicine).  Patients are able to view lab/test results, encounter notes, upcoming appointments, etc.  Non-urgent messages can be sent to your provider as well.   To learn more about what you can do with MyChart, go to [email or web address].    Your next appointment:   6 month(s)  The format for your next appointment:   In Person  Provider:   You may see Dr. Graciela Husbands  or one of the following Advanced Practice Providers on your designated Care Team:    Gypsy Balsam, NP  Francis Dowse, PA-C  Casimiro Needle "Otilio Saber, [place]    Other Instructions

## 2020-10-24 LAB — COMPREHENSIVE METABOLIC PANEL
ALT: 10 IU/L (ref 0–32)
AST: 18 IU/L (ref 0–40)
Albumin/Globulin Ratio: 1.7 (ref 1.2–2.2)
Albumin: 4.3 g/dL (ref 3.6–4.6)
Alkaline Phosphatase: 66 IU/L (ref 44–121)
BUN/Creatinine Ratio: 27 (ref 12–28)
BUN: 31 mg/dL — ABNORMAL HIGH (ref 8–27)
Bilirubin Total: 0.3 mg/dL (ref 0.0–1.2)
CO2: 24 mmol/L (ref 20–29)
Calcium: 9.3 mg/dL (ref 8.7–10.3)
Chloride: 101 mmol/L (ref 96–106)
Creatinine, Ser: 1.16 mg/dL — ABNORMAL HIGH (ref 0.57–1.00)
GFR calc Af Amer: 48 mL/min/{1.73_m2} — ABNORMAL LOW (ref >59)
GFR calc non Af Amer: 42 mL/min/{1.73_m2} — ABNORMAL LOW (ref >59)
Globulin, Total: 2.5 g/dL (ref 1.5–4.5)
Glucose: 88 mg/dL (ref 65–99)
Potassium: 4.2 mmol/L (ref 3.5–5.2)
Sodium: 141 mmol/L (ref 134–144)
Total Protein: 6.8 g/dL (ref 6.0–8.5)

## 2020-10-24 LAB — CBC
Hematocrit: 36.3 % (ref 34.0–46.6)
Hemoglobin: 11.5 g/dL (ref 11.1–15.9)
MCH: 30 pg (ref 26.6–33.0)
MCHC: 31.7 g/dL (ref 31.5–35.7)
MCV: 95 fL (ref 79–97)
Platelets: 284 10*3/uL (ref 150–450)
RBC: 3.83 x10E6/uL (ref 3.77–5.28)
RDW: 13.1 % (ref 11.7–15.4)
WBC: 7 10*3/uL (ref 3.4–10.8)

## 2020-10-24 LAB — TSH: TSH: 1.55 u[IU]/mL (ref 0.450–4.500)

## 2020-10-27 ENCOUNTER — Other Ambulatory Visit: Payer: Self-pay | Admitting: Internal Medicine

## 2020-10-27 DIAGNOSIS — I480 Paroxysmal atrial fibrillation: Secondary | ICD-10-CM

## 2020-10-30 DIAGNOSIS — Z20828 Contact with and (suspected) exposure to other viral communicable diseases: Secondary | ICD-10-CM | POA: Diagnosis not present

## 2020-10-30 DIAGNOSIS — Z1159 Encounter for screening for other viral diseases: Secondary | ICD-10-CM | POA: Diagnosis not present

## 2020-10-30 NOTE — Telephone Encounter (Signed)
Prescription refill request for Xarelto received.  Indication: a fib Last office visit: 10/23/20 Weight: 129 Age:87 Scr: 1.16 CrCl: 47mL/min

## 2020-11-01 NOTE — Progress Notes (Signed)
Remote pacemaker transmission.   

## 2020-11-06 ENCOUNTER — Other Ambulatory Visit: Payer: Self-pay | Admitting: Internal Medicine

## 2020-11-06 DIAGNOSIS — Z20828 Contact with and (suspected) exposure to other viral communicable diseases: Secondary | ICD-10-CM | POA: Diagnosis not present

## 2020-11-06 DIAGNOSIS — Z1159 Encounter for screening for other viral diseases: Secondary | ICD-10-CM | POA: Diagnosis not present

## 2020-11-13 DIAGNOSIS — Z20828 Contact with and (suspected) exposure to other viral communicable diseases: Secondary | ICD-10-CM | POA: Diagnosis not present

## 2020-11-13 DIAGNOSIS — Z1159 Encounter for screening for other viral diseases: Secondary | ICD-10-CM | POA: Diagnosis not present

## 2020-11-20 DIAGNOSIS — Z20828 Contact with and (suspected) exposure to other viral communicable diseases: Secondary | ICD-10-CM | POA: Diagnosis not present

## 2020-11-20 DIAGNOSIS — Z1159 Encounter for screening for other viral diseases: Secondary | ICD-10-CM | POA: Diagnosis not present

## 2020-11-21 ENCOUNTER — Other Ambulatory Visit: Payer: Self-pay | Admitting: Internal Medicine

## 2020-11-27 DIAGNOSIS — Z1159 Encounter for screening for other viral diseases: Secondary | ICD-10-CM | POA: Diagnosis not present

## 2020-11-27 DIAGNOSIS — Z20828 Contact with and (suspected) exposure to other viral communicable diseases: Secondary | ICD-10-CM | POA: Diagnosis not present

## 2020-12-04 DIAGNOSIS — Z1159 Encounter for screening for other viral diseases: Secondary | ICD-10-CM | POA: Diagnosis not present

## 2020-12-04 DIAGNOSIS — Z20828 Contact with and (suspected) exposure to other viral communicable diseases: Secondary | ICD-10-CM | POA: Diagnosis not present

## 2020-12-11 DIAGNOSIS — Z1159 Encounter for screening for other viral diseases: Secondary | ICD-10-CM | POA: Diagnosis not present

## 2020-12-11 DIAGNOSIS — Z20828 Contact with and (suspected) exposure to other viral communicable diseases: Secondary | ICD-10-CM | POA: Diagnosis not present

## 2020-12-18 DIAGNOSIS — Z1159 Encounter for screening for other viral diseases: Secondary | ICD-10-CM | POA: Diagnosis not present

## 2020-12-18 DIAGNOSIS — Z20828 Contact with and (suspected) exposure to other viral communicable diseases: Secondary | ICD-10-CM | POA: Diagnosis not present

## 2020-12-25 DIAGNOSIS — Z20828 Contact with and (suspected) exposure to other viral communicable diseases: Secondary | ICD-10-CM | POA: Diagnosis not present

## 2020-12-25 DIAGNOSIS — Z1159 Encounter for screening for other viral diseases: Secondary | ICD-10-CM | POA: Diagnosis not present

## 2021-01-01 DIAGNOSIS — Z20828 Contact with and (suspected) exposure to other viral communicable diseases: Secondary | ICD-10-CM | POA: Diagnosis not present

## 2021-01-01 DIAGNOSIS — Z1159 Encounter for screening for other viral diseases: Secondary | ICD-10-CM | POA: Diagnosis not present

## 2021-01-08 DIAGNOSIS — Z20828 Contact with and (suspected) exposure to other viral communicable diseases: Secondary | ICD-10-CM | POA: Diagnosis not present

## 2021-01-08 DIAGNOSIS — Z1159 Encounter for screening for other viral diseases: Secondary | ICD-10-CM | POA: Diagnosis not present

## 2021-01-10 DIAGNOSIS — D485 Neoplasm of uncertain behavior of skin: Secondary | ICD-10-CM | POA: Diagnosis not present

## 2021-01-10 DIAGNOSIS — Z85828 Personal history of other malignant neoplasm of skin: Secondary | ICD-10-CM | POA: Diagnosis not present

## 2021-01-10 DIAGNOSIS — L821 Other seborrheic keratosis: Secondary | ICD-10-CM | POA: Diagnosis not present

## 2021-01-10 DIAGNOSIS — B078 Other viral warts: Secondary | ICD-10-CM | POA: Diagnosis not present

## 2021-01-15 ENCOUNTER — Other Ambulatory Visit: Payer: Self-pay | Admitting: Internal Medicine

## 2021-01-15 DIAGNOSIS — Z1159 Encounter for screening for other viral diseases: Secondary | ICD-10-CM | POA: Diagnosis not present

## 2021-01-15 DIAGNOSIS — Z20828 Contact with and (suspected) exposure to other viral communicable diseases: Secondary | ICD-10-CM | POA: Diagnosis not present

## 2021-01-17 ENCOUNTER — Ambulatory Visit (INDEPENDENT_AMBULATORY_CARE_PROVIDER_SITE_OTHER): Payer: PPO

## 2021-01-17 DIAGNOSIS — I442 Atrioventricular block, complete: Secondary | ICD-10-CM

## 2021-01-17 LAB — CUP PACEART REMOTE DEVICE CHECK
Battery Remaining Longevity: 122 mo
Battery Remaining Percentage: 95.5 %
Battery Voltage: 3.01 V
Brady Statistic AP VP Percent: 18 %
Brady Statistic AP VS Percent: 1 %
Brady Statistic AS VP Percent: 82 %
Brady Statistic AS VS Percent: 1 %
Brady Statistic RA Percent Paced: 18 %
Brady Statistic RV Percent Paced: 99 %
Date Time Interrogation Session: 20220323074403
Implantable Lead Implant Date: 20181130
Implantable Lead Implant Date: 20181130
Implantable Lead Location: 753859
Implantable Lead Location: 753860
Implantable Pulse Generator Implant Date: 20181130
Lead Channel Impedance Value: 430 Ohm
Lead Channel Impedance Value: 480 Ohm
Lead Channel Pacing Threshold Amplitude: 0.75 V
Lead Channel Pacing Threshold Amplitude: 0.75 V
Lead Channel Pacing Threshold Pulse Width: 0.5 ms
Lead Channel Pacing Threshold Pulse Width: 0.5 ms
Lead Channel Sensing Intrinsic Amplitude: 2.7 mV
Lead Channel Sensing Intrinsic Amplitude: 9.5 mV
Lead Channel Setting Pacing Amplitude: 1 V
Lead Channel Setting Pacing Amplitude: 2 V
Lead Channel Setting Pacing Pulse Width: 0.5 ms
Lead Channel Setting Sensing Sensitivity: 5 mV
Pulse Gen Model: 2272
Pulse Gen Serial Number: 8969488

## 2021-01-22 DIAGNOSIS — Z20828 Contact with and (suspected) exposure to other viral communicable diseases: Secondary | ICD-10-CM | POA: Diagnosis not present

## 2021-01-22 DIAGNOSIS — Z1159 Encounter for screening for other viral diseases: Secondary | ICD-10-CM | POA: Diagnosis not present

## 2021-01-26 DIAGNOSIS — I700 Atherosclerosis of aorta: Secondary | ICD-10-CM | POA: Diagnosis not present

## 2021-01-26 DIAGNOSIS — N1831 Chronic kidney disease, stage 3a: Secondary | ICD-10-CM | POA: Diagnosis not present

## 2021-01-26 DIAGNOSIS — D6869 Other thrombophilia: Secondary | ICD-10-CM | POA: Diagnosis not present

## 2021-01-26 DIAGNOSIS — I480 Paroxysmal atrial fibrillation: Secondary | ICD-10-CM | POA: Diagnosis not present

## 2021-01-26 DIAGNOSIS — I5022 Chronic systolic (congestive) heart failure: Secondary | ICD-10-CM | POA: Diagnosis not present

## 2021-01-26 DIAGNOSIS — Z950 Presence of cardiac pacemaker: Secondary | ICD-10-CM | POA: Diagnosis not present

## 2021-01-26 DIAGNOSIS — I471 Supraventricular tachycardia: Secondary | ICD-10-CM | POA: Diagnosis not present

## 2021-01-26 DIAGNOSIS — Z79899 Other long term (current) drug therapy: Secondary | ICD-10-CM | POA: Diagnosis not present

## 2021-01-26 DIAGNOSIS — I351 Nonrheumatic aortic (valve) insufficiency: Secondary | ICD-10-CM | POA: Diagnosis not present

## 2021-01-26 NOTE — Progress Notes (Signed)
Remote pacemaker transmission.   

## 2021-01-29 DIAGNOSIS — Z1159 Encounter for screening for other viral diseases: Secondary | ICD-10-CM | POA: Diagnosis not present

## 2021-01-29 DIAGNOSIS — Z20828 Contact with and (suspected) exposure to other viral communicable diseases: Secondary | ICD-10-CM | POA: Diagnosis not present

## 2021-02-12 DIAGNOSIS — Z1159 Encounter for screening for other viral diseases: Secondary | ICD-10-CM | POA: Diagnosis not present

## 2021-02-12 DIAGNOSIS — Z20828 Contact with and (suspected) exposure to other viral communicable diseases: Secondary | ICD-10-CM | POA: Diagnosis not present

## 2021-03-12 DIAGNOSIS — Z1159 Encounter for screening for other viral diseases: Secondary | ICD-10-CM | POA: Diagnosis not present

## 2021-03-12 DIAGNOSIS — Z20828 Contact with and (suspected) exposure to other viral communicable diseases: Secondary | ICD-10-CM | POA: Diagnosis not present

## 2021-04-02 DIAGNOSIS — Z20828 Contact with and (suspected) exposure to other viral communicable diseases: Secondary | ICD-10-CM | POA: Diagnosis not present

## 2021-04-02 DIAGNOSIS — Z1159 Encounter for screening for other viral diseases: Secondary | ICD-10-CM | POA: Diagnosis not present

## 2021-04-09 DIAGNOSIS — Z20828 Contact with and (suspected) exposure to other viral communicable diseases: Secondary | ICD-10-CM | POA: Diagnosis not present

## 2021-04-09 DIAGNOSIS — Z1159 Encounter for screening for other viral diseases: Secondary | ICD-10-CM | POA: Diagnosis not present

## 2021-04-16 DIAGNOSIS — Z20828 Contact with and (suspected) exposure to other viral communicable diseases: Secondary | ICD-10-CM | POA: Diagnosis not present

## 2021-04-16 DIAGNOSIS — Z1159 Encounter for screening for other viral diseases: Secondary | ICD-10-CM | POA: Diagnosis not present

## 2021-04-18 ENCOUNTER — Other Ambulatory Visit: Payer: Self-pay

## 2021-04-18 ENCOUNTER — Ambulatory Visit (INDEPENDENT_AMBULATORY_CARE_PROVIDER_SITE_OTHER): Payer: PPO

## 2021-04-18 DIAGNOSIS — I442 Atrioventricular block, complete: Secondary | ICD-10-CM | POA: Diagnosis not present

## 2021-04-18 LAB — CUP PACEART REMOTE DEVICE CHECK
Battery Remaining Longevity: 77 mo
Battery Remaining Percentage: 65 %
Battery Voltage: 2.99 V
Brady Statistic AP VP Percent: 21 %
Brady Statistic AP VS Percent: 1 %
Brady Statistic AS VP Percent: 78 %
Brady Statistic AS VS Percent: 1 %
Brady Statistic RA Percent Paced: 21 %
Brady Statistic RV Percent Paced: 99 %
Date Time Interrogation Session: 20220622020034
Implantable Lead Implant Date: 20181130
Implantable Lead Implant Date: 20181130
Implantable Lead Location: 753859
Implantable Lead Location: 753860
Implantable Pulse Generator Implant Date: 20181130
Lead Channel Impedance Value: 350 Ohm
Lead Channel Impedance Value: 540 Ohm
Lead Channel Pacing Threshold Amplitude: 0.75 V
Lead Channel Pacing Threshold Amplitude: 0.75 V
Lead Channel Pacing Threshold Pulse Width: 0.5 ms
Lead Channel Pacing Threshold Pulse Width: 0.5 ms
Lead Channel Sensing Intrinsic Amplitude: 2.9 mV
Lead Channel Sensing Intrinsic Amplitude: 9.5 mV
Lead Channel Setting Pacing Amplitude: 1 V
Lead Channel Setting Pacing Amplitude: 2 V
Lead Channel Setting Pacing Pulse Width: 0.5 ms
Lead Channel Setting Sensing Sensitivity: 5 mV
Pulse Gen Model: 2272
Pulse Gen Serial Number: 8969488

## 2021-04-23 DIAGNOSIS — Z1159 Encounter for screening for other viral diseases: Secondary | ICD-10-CM | POA: Diagnosis not present

## 2021-04-23 DIAGNOSIS — Z20828 Contact with and (suspected) exposure to other viral communicable diseases: Secondary | ICD-10-CM | POA: Diagnosis not present

## 2021-04-25 ENCOUNTER — Encounter: Payer: PPO | Admitting: Physician Assistant

## 2021-04-30 DIAGNOSIS — Z20828 Contact with and (suspected) exposure to other viral communicable diseases: Secondary | ICD-10-CM | POA: Diagnosis not present

## 2021-04-30 DIAGNOSIS — Z1159 Encounter for screening for other viral diseases: Secondary | ICD-10-CM | POA: Diagnosis not present

## 2021-05-08 ENCOUNTER — Other Ambulatory Visit: Payer: Self-pay | Admitting: Internal Medicine

## 2021-05-08 DIAGNOSIS — I480 Paroxysmal atrial fibrillation: Secondary | ICD-10-CM

## 2021-05-08 NOTE — Progress Notes (Signed)
Remote pacemaker transmission.   

## 2021-05-08 NOTE — Telephone Encounter (Signed)
Prescription refill request for Xarelto received.  Indication: Atrial Fib Last office visit: 10/23/20  Jens Som PA Weight: 58.5kg Age: 87 Scr: 1.16 on 10/23/20 CrCl: 29.77  Based on above findings Xarelto 15mg  daily (renal dose) is the appropriate dose.  Pt is due for CBC/BMP.  She has appt with Claudina Lick on 05/28/21. Note placed in appt notes to order at time of visit.  Refill approved.

## 2021-05-14 DIAGNOSIS — Z20828 Contact with and (suspected) exposure to other viral communicable diseases: Secondary | ICD-10-CM | POA: Diagnosis not present

## 2021-05-14 DIAGNOSIS — Z1159 Encounter for screening for other viral diseases: Secondary | ICD-10-CM | POA: Diagnosis not present

## 2021-05-21 DIAGNOSIS — Z1159 Encounter for screening for other viral diseases: Secondary | ICD-10-CM | POA: Diagnosis not present

## 2021-05-21 DIAGNOSIS — Z20828 Contact with and (suspected) exposure to other viral communicable diseases: Secondary | ICD-10-CM | POA: Diagnosis not present

## 2021-05-28 ENCOUNTER — Encounter: Payer: PPO | Admitting: Student

## 2021-05-28 DIAGNOSIS — Z1159 Encounter for screening for other viral diseases: Secondary | ICD-10-CM | POA: Diagnosis not present

## 2021-05-28 DIAGNOSIS — Z20828 Contact with and (suspected) exposure to other viral communicable diseases: Secondary | ICD-10-CM | POA: Diagnosis not present

## 2021-06-11 DIAGNOSIS — Z20828 Contact with and (suspected) exposure to other viral communicable diseases: Secondary | ICD-10-CM | POA: Diagnosis not present

## 2021-06-17 NOTE — Progress Notes (Deleted)
Electrophysiology Office Note Date: 06/17/2021  ID:  Yvonne [name], DOB 30-Jan-1929, MRN [id number]  PCP: Lajean Manes, MD Primary Cardiologist: None Electrophysiologist: Virl Axe, MD   CC: Pacemaker follow-up  Yvonne [name] is a 87 y.o. female seen today for Virl Axe, MD for routine electrophysiology followup.  Since last being seen in our clinic the patient reports doing ***.  she denies chest pain, palpitations, dyspnea, PND, orthopnea, nausea, vomiting, dizziness, syncope, edema, weight gain, or early satiety.  Device History: St. Jude Dual Chamber PPM implanted 08/2017 for Advanced AV block  Past Medical History:  Diagnosis Date   Anxiety state, unspecified    Arthritis    Cancer ([place])    basil cell carcinomas removed   Cataract    Complete heart block ([place])    a. s/p St. Jude PPM 09/26/17.   Esophageal reflux    Hemorrhage of gastrointestinal tract, unspecified    a. occurred on Aleve   History of peptic ulcer    LBBB (left bundle branch block)    Left ventricular systolic dysfunction    hx of mild   Mitral valve insufficiency and aortic valve insufficiency    a. Remote echo 2010: mild focal basal hypertrophy of septum, EF 50-55%, mid-distal inferoseptal myocardium, grade 1 DD, LBBB, mild AI/MR, PASP mildly increased.   PAF (paroxysmal atrial fibrillation) (HCC)    Paroxysmal supraventricular tachycardia (HCC)    Wears glasses    Past Surgical History:  Procedure Laterality Date   ABDOMINAL HYSTERECTOMY     APPENDECTOMY     BREAST SURGERY     biopsy   CATARACT EXTRACTION Left    ESOPHAGOGASTRODUODENOSCOPY     ESOPHAGOGASTRODUODENOSCOPY (EGD) WITH PROPOFOL N/A 10/10/2015   Procedure: ESOPHAGOGASTRODUODENOSCOPY (EGD) WITH PROPOFOL;  Surgeon: Garlan Fair, MD;  [facility];  Service: Endoscopy;  Laterality: N/A;   FOOT SURGERY Left    hammer toe and bunion   KNEE ARTHROSCOPY  2009   left   OPEN REDUCTION INTERNAL FIXATION (ORIF)  METACARPAL Left 11/05/2013   Procedure: CLOSED REDUCTION/PINNING VS OPEN REDUCTION INTERNAL FIXATION (ORIF) LEFT SMALL  METACARPAL FRACTURE ;  Surgeon: Cammie Sickle., MD;  Location: [facility];  Service: Orthopedics;  Laterality: Left;   ORIF HUMERUS FRACTURE Left 04/10/2015   Procedure: OPEN REDUCTION INTERNAL FIXATION (ORIF) PROXIMAL HUMERUS FRACTURE;  Surgeon: Marybelle Killings, MD;  Location: [facility];  Service: Orthopedics;  Laterality: Left;   PACEMAKER IMPLANT N/A 09/26/2017   Procedure: PACEMAKER IMPLANT;  Surgeon: Constance Haw, MD;  Location: [facility];  Service: Cardiovascular;  Laterality: N/A;   TONSILLECTOMY     TUBAL LIGATION      Current Outpatient Medications  Medication Sig Dispense Refill   acetaminophen (TYLENOL) 500 MG tablet Take 500-1,000 mg by mouth every 6 (six) hours as needed (for pain).     amiodarone (PACERONE) 200 MG tablet TAKE ONE-HALF TABLET DAILY 45 tablet 3   buPROPion (WELLBUTRIN XL) 300 MG 24 hr tablet Take 1 tablet by mouth daily.     carvedilol (COREG) 12.5 MG tablet ONE TABLET TWICE A DAY WITH A MEAL 180 tablet 3   clorazepate (TRANXENE) 7.5 MG tablet Take 7.5 mg by mouth 3 (three) times daily as needed for anxiety.      estradiol (ESTRACE) 0.5 MG tablet Take 0.5 mg by mouth daily.     fluticasone (FLONASE) 50 MCG/ACT nasal spray Place 2 sprays into both nostrils daily as needed for  allergies.   5   furosemide (LASIX) 20 MG tablet TAKE TWO TABLETS EVERY DAY 180 tablet 2   ondansetron (ZOFRAN) 8 MG tablet Take 8 mg by mouth every 8 (eight) hours as needed for nausea or vomiting.     potassium chloride (K-DUR) 10 MEQ tablet Take 10 mEq by mouth 2 (two) times daily.     Rivaroxaban (XARELTO) 15 MG TABS tablet TAKE ONE TABLET DAILY WITH SUPPER 90 tablet 0   sodium chloride (OCEAN) 0.65 % SOLN nasal spray Place 2 sprays into both nostrils daily as needed for congestion.     No current facility-administered medications for this  visit.    Allergies:   Aleve [naproxen sodium], Ambien [zolpidem], Flexeril [cyclobenzaprine], Adhesive [tape], and Aspirin   Social History: Social History   Socioeconomic History   Marital status: Married    Spouse name: Not on file   Number of children: Not on file   Years of education: Not on file   Highest education level: Not on file  Occupational History   Not on file  Tobacco Use   Smoking status: Never   Smokeless tobacco: Never  Substance and Sexual Activity   Alcohol use: No   Drug use: No   Sexual activity: Not on file  Other Topics Concern   Not on file  Social History Narrative   Not on file   Social Determinants of Health   Financial Resource Strain: Not on file  Food Insecurity: Not on file  Transportation Needs: Not on file  Physical Activity: Not on file  Stress: Not on file  Social Connections: Not on file  Intimate Partner Violence: Not on file    Family History: Family History  Problem Relation Age of Onset   Hypertension Mother    Heart failure Father    Stroke Sister    Heart attack Neg Hx      Review of Systems: All other systems reviewed and are otherwise negative except as noted above.  Physical Exam: There were no vitals filed for this visit.   GEN- The patient is well appearing, alert and oriented x 3 today.   HEENT: normocephalic, atraumatic; sclera clear, conjunctiva pink; hearing intact; oropharynx clear; neck supple  Lungs- Clear to ausculation bilaterally, normal work of breathing.  No wheezes, rales, rhonchi Heart- Regular rate and rhythm, no murmurs, rubs or gallops  GI- soft, non-tender, non-distended, bowel sounds present  Extremities- no clubbing or cyanosis. No edema MS- no significant deformity or atrophy Skin- warm and dry, no rash or lesion; PPM pocket well healed Psych- euthymic mood, full affect Neuro- strength and sensation are intact  PPM Interrogation- reviewed in detail today,  See PACEART report  EKG:   EKG is not ordered today.  Recent Labs: 10/23/2020: ALT 10; BUN 31; Creatinine, Ser 1.16; Hemoglobin 11.5; Platelets 284; Potassium 4.2; Sodium 141; TSH 1.550   Wt Readings from Last 3 Encounters:  10/23/20 129 lb (58.5 kg)  06/14/19 138 lb (62.6 kg)  01/19/18 136 lb (61.7 kg)     Other studies Reviewed: Additional studies/ records that were reviewed today include: Previous EP office notes, Previous remote checks, Most recent labwork.   Assessment and Plan:  1. Advanced AV block s/p St. Jude PPM  Normal PPM function See [facility] report No changes today  2. Paroxysmal Afib Continue Xarelto for CHA2DS2-VASc of at least 4.  Burden <***% on low dose amio Surveillance labs today.   3. HTN Stable No change to current  regimen given age and h/o falls.   4. VHD     Mild-mod MR by echo 08/2016     No symptoms to suggest change at this time     5. Falls *** Continue to encouraged safety and fall prevention.     Current medicines are reviewed at length with the patient today.   The patient {ACTIONS; HAS/DOES NOT HAVE:19233} concerns regarding her medicines.  The following changes were made today:  {NONE DEFAULTED:18576}  Labs/ tests ordered today include: *** No orders of the defined types were placed in this encounter.    Disposition:   Follow up with  Dr. Caryl Comes or Joseph Art in 6 months by patient request.     Signed, Shirley Friar, PA-C  06/17/2021 9:17 PM  [address] [place] [address] [phone] (office) [phone] (fax)

## 2021-06-18 ENCOUNTER — Encounter: Payer: PPO | Admitting: Student

## 2021-06-18 DIAGNOSIS — I480 Paroxysmal atrial fibrillation: Secondary | ICD-10-CM

## 2021-06-18 DIAGNOSIS — Z79899 Other long term (current) drug therapy: Secondary | ICD-10-CM

## 2021-06-18 DIAGNOSIS — I10 Essential (primary) hypertension: Secondary | ICD-10-CM

## 2021-06-18 DIAGNOSIS — I442 Atrioventricular block, complete: Secondary | ICD-10-CM

## 2021-06-21 NOTE — Progress Notes (Signed)
Electrophysiology Office Note Date: 06/22/2021  ID:  Yvonne [name], DOB [birth year], MRN [id number]  PCP: Lajean Manes, MD Primary Cardiologist: None Electrophysiologist: Virl Axe, MD   CC: Pacemaker follow-up  Yvonne [name] is a 87 y.o. female seen today for Virl Axe, MD for routine electrophysiology followup.  Since last being seen in our clinic the patient reports doing very well. She continues to have frequent falls. Has large ecchymosis on her left arm from fall last week.  she denies chest pain, palpitations, dyspnea, PND, orthopnea, nausea, vomiting, dizziness, syncope, edema, weight gain, or early satiety.  Device History: St. Jude Dual Chamber PPM implanted 08/2017 for Advanced AV block  Past Medical History:  Diagnosis Date   Anxiety state, unspecified    Arthritis    Cancer ([place])    basil cell carcinomas removed   Cataract    Complete heart block ([place])    a. s/p St. Jude PPM 09/26/17.   Esophageal reflux    Hemorrhage of gastrointestinal tract, unspecified    a. occurred on Aleve   History of peptic ulcer    LBBB (left bundle branch block)    Left ventricular systolic dysfunction    hx of mild   Mitral valve insufficiency and aortic valve insufficiency    a. Remote echo 2010: mild focal basal hypertrophy of septum, EF 50-55%, mid-distal inferoseptal myocardium, grade 1 DD, LBBB, mild AI/MR, PASP mildly increased.   PAF (paroxysmal atrial fibrillation) (HCC)    Paroxysmal supraventricular tachycardia (HCC)    Wears glasses    Past Surgical History:  Procedure Laterality Date   ABDOMINAL HYSTERECTOMY     APPENDECTOMY     BREAST SURGERY     biopsy   CATARACT EXTRACTION Left    ESOPHAGOGASTRODUODENOSCOPY     ESOPHAGOGASTRODUODENOSCOPY (EGD) WITH PROPOFOL N/A 10/10/2015   Procedure: ESOPHAGOGASTRODUODENOSCOPY (EGD) WITH PROPOFOL;  Surgeon: Garlan Fair, MD;  [facility];  Service: Endoscopy;  Laterality: N/A;   FOOT SURGERY Left     hammer toe and bunion   KNEE ARTHROSCOPY  2009   left   OPEN REDUCTION INTERNAL FIXATION (ORIF) METACARPAL Left 11/05/2013   Procedure: CLOSED REDUCTION/PINNING VS OPEN REDUCTION INTERNAL FIXATION (ORIF) LEFT SMALL  METACARPAL FRACTURE ;  Surgeon: Cammie Sickle., MD;  Location: [facility];  Service: Orthopedics;  Laterality: Left;   ORIF HUMERUS FRACTURE Left 04/10/2015   Procedure: OPEN REDUCTION INTERNAL FIXATION (ORIF) PROXIMAL HUMERUS FRACTURE;  Surgeon: Marybelle Killings, MD;  Location: [facility];  Service: Orthopedics;  Laterality: Left;   PACEMAKER IMPLANT N/A 09/26/2017   Procedure: PACEMAKER IMPLANT;  Surgeon: Constance Haw, MD;  Location: [facility];  Service: Cardiovascular;  Laterality: N/A;   TONSILLECTOMY     TUBAL LIGATION      Current Outpatient Medications  Medication Sig Dispense Refill   acetaminophen (TYLENOL) 500 MG tablet Take 500-1,000 mg by mouth every 6 (six) hours as needed (for pain).     amiodarone (PACERONE) 200 MG tablet TAKE ONE-HALF TABLET DAILY 45 tablet 3   buPROPion (WELLBUTRIN XL) 300 MG 24 hr tablet Take 1 tablet by mouth daily.     carvedilol (COREG) 12.5 MG tablet ONE TABLET TWICE A DAY WITH A MEAL 180 tablet 3   clorazepate (TRANXENE) 7.5 MG tablet Take 7.5 mg by mouth 3 (three) times daily as needed for anxiety.      estradiol (ESTRACE) 0.5 MG tablet Take 0.5 mg by mouth daily.  fluticasone (FLONASE) 50 MCG/ACT nasal spray Place 2 sprays into both nostrils daily as needed for allergies.   5   furosemide (LASIX) 20 MG tablet TAKE TWO TABLETS EVERY DAY 180 tablet 2   ondansetron (ZOFRAN) 8 MG tablet Take 8 mg by mouth every 8 (eight) hours as needed for nausea or vomiting.     potassium chloride (K-DUR) 10 MEQ tablet Take 10 mEq by mouth 2 (two) times daily.     Rivaroxaban (XARELTO) 15 MG TABS tablet TAKE ONE TABLET DAILY WITH SUPPER 90 tablet 0   sodium chloride (OCEAN) 0.65 % SOLN nasal spray Place 2 sprays into both  nostrils daily as needed for congestion.     No current facility-administered medications for this visit.    Allergies:   Aleve [naproxen sodium], Ambien [zolpidem], Flexeril [cyclobenzaprine], Adhesive [tape], and Aspirin   Social History: Social History   Socioeconomic History   Marital status: Married    Spouse name: Not on file   Number of children: Not on file   Years of education: Not on file   Highest education level: Not on file  Occupational History   Not on file  Tobacco Use   Smoking status: Never   Smokeless tobacco: Never  Substance and Sexual Activity   Alcohol use: No   Drug use: No   Sexual activity: Not on file  Other Topics Concern   Not on file  Social History Narrative   Not on file   Social Determinants of Health   Financial Resource Strain: Not on file  Food Insecurity: Not on file  Transportation Needs: Not on file  Physical Activity: Not on file  Stress: Not on file  Social Connections: Not on file  Intimate Partner Violence: Not on file    Family History: Family History  Problem Relation Age of Onset   Hypertension Mother    Heart failure Father    Stroke Sister    Heart attack Neg Hx      Review of Systems: All other systems reviewed and are otherwise negative except as noted above.  Physical Exam: Vitals:   06/22/21 0949  BP: 110/60  Pulse: 74  SpO2: 96%  Weight: 125 lb (56.7 kg)  Height: '5\' 7"'$  (1.702 m)     GEN- The patient is well appearing, alert and oriented x 3 today.   HEENT: normocephalic, atraumatic; sclera clear, conjunctiva pink; hearing intact; oropharynx clear; neck supple  Lungs- Clear to ausculation bilaterally, normal work of breathing.  No wheezes, rales, rhonchi Heart- Regular rate and rhythm, no murmurs, rubs or gallops  GI- soft, non-tender, non-distended, bowel sounds present  Extremities- no clubbing or cyanosis. No edema MS- no significant deformity or atrophy Skin- warm and dry, no rash or lesion;  PPM pocket well healed Psych- euthymic mood, full affect Neuro- strength and sensation are intact  PPM Interrogation- reviewed in detail today,  See PACEART report  EKG:  EKG is not ordered today.  Recent Labs: 10/23/2020: ALT 10; BUN 31; Creatinine, Ser 1.16; Hemoglobin 11.5; Platelets 284; Potassium 4.2; Sodium 141; TSH 1.550   Wt Readings from Last 3 Encounters:  06/22/21 125 lb (56.7 kg)  10/23/20 129 lb (58.5 kg)  06/14/19 138 lb (62.6 kg)     Other studies Reviewed: Additional studies/ records that were reviewed today include: Previous EP office notes, Previous remote checks, Most recent labwork.   Assessment and Plan:  1. Advanced AV block s/p St. Jude PPM  Normal PPM function See [employer]  Art report No changes today  2. Paroxysmal Afib Continue Xarelto for CHA2DS2-VASc of at least 5 Burden <1% on low dose amio Surveillance labs today.   3. HTN Stable No change to current regimen given age and h/o falls.   4. VHD     Mild-mod MR by echo 08/2016     No symptoms to suggest change at this time   5. Falls She continues to fall weekly. I have asked her to keep a diary over the next 2 weeks of how often she's falling Continue to encouraged safety and fall prevention.     Her mother and grandmother both had severe strokes, so she is hesitant to even consider coming off of [place].  Current medicines are reviewed at length with the patient today.   The patient does not have concerns regarding her medicines.    Labs/ tests ordered today include:  Orders Placed This Encounter  Procedures   Comprehensive metabolic panel   TSH   CBC     Disposition:   Follow up with Dr. Caryl Comes in 6 months.   Jacalyn Lefevre, PA-C  06/22/2021 10:13 AM  [facility] [address] [place] [address] [phone] (office) [phone] (fax)

## 2021-06-22 ENCOUNTER — Other Ambulatory Visit: Payer: Self-pay

## 2021-06-22 ENCOUNTER — Encounter: Payer: Self-pay | Admitting: Student

## 2021-06-22 ENCOUNTER — Ambulatory Visit (INDEPENDENT_AMBULATORY_CARE_PROVIDER_SITE_OTHER): Payer: PPO | Admitting: Student

## 2021-06-22 ENCOUNTER — Ambulatory Visit: Payer: PPO | Admitting: Orthopaedic Surgery

## 2021-06-22 VITALS — BP 110/60 | HR 74 | Ht 67.0 in | Wt 125.0 lb

## 2021-06-22 DIAGNOSIS — I480 Paroxysmal atrial fibrillation: Secondary | ICD-10-CM

## 2021-06-22 DIAGNOSIS — Z950 Presence of cardiac pacemaker: Secondary | ICD-10-CM

## 2021-06-22 DIAGNOSIS — I442 Atrioventricular block, complete: Secondary | ICD-10-CM

## 2021-06-22 DIAGNOSIS — I10 Essential (primary) hypertension: Secondary | ICD-10-CM | POA: Diagnosis not present

## 2021-06-22 LAB — CUP PACEART INCLINIC DEVICE CHECK
Battery Remaining Longevity: 80 mo
Battery Voltage: 2.99 V
Brady Statistic RA Percent Paced: 21 %
Brady Statistic RV Percent Paced: 99.98 %
Date Time Interrogation Session: 20220826102046
Implantable Lead Implant Date: 20181130
Implantable Lead Implant Date: 20181130
Implantable Lead Location: 753859
Implantable Lead Location: 753860
Implantable Pulse Generator Implant Date: 20181130
Lead Channel Impedance Value: 350 Ohm
Lead Channel Impedance Value: 537.5 Ohm
Lead Channel Pacing Threshold Amplitude: 0.625 V
Lead Channel Pacing Threshold Amplitude: 0.75 V
Lead Channel Pacing Threshold Amplitude: 0.75 V
Lead Channel Pacing Threshold Pulse Width: 0.5 ms
Lead Channel Pacing Threshold Pulse Width: 0.5 ms
Lead Channel Pacing Threshold Pulse Width: 0.5 ms
Lead Channel Sensing Intrinsic Amplitude: 2.1 mV
Lead Channel Sensing Intrinsic Amplitude: 9.2 mV
Lead Channel Setting Pacing Amplitude: 0.875
Lead Channel Setting Pacing Amplitude: 2 V
Lead Channel Setting Pacing Pulse Width: 0.5 ms
Lead Channel Setting Sensing Sensitivity: 5 mV
Pulse Gen Model: 2272
Pulse Gen Serial Number: 8969488

## 2021-06-22 LAB — CBC
Hematocrit: 36.6 % (ref 34.0–46.6)
Hemoglobin: 11.9 g/dL (ref 11.1–15.9)
MCH: 30.3 pg (ref 26.6–33.0)
MCHC: 32.5 g/dL (ref 31.5–35.7)
MCV: 93 fL (ref 79–97)
Platelets: 261 10*3/uL (ref 150–450)
RBC: 3.93 x10E6/uL (ref 3.77–5.28)
RDW: 13.1 % (ref 11.7–15.4)
WBC: 6.2 10*3/uL (ref 3.4–10.8)

## 2021-06-22 LAB — COMPREHENSIVE METABOLIC PANEL
ALT: 9 IU/L (ref 0–32)
AST: 14 IU/L (ref 0–40)
Albumin/Globulin Ratio: 2.3 — ABNORMAL HIGH (ref 1.2–2.2)
Albumin: 4.5 g/dL (ref 3.5–4.6)
Alkaline Phosphatase: 60 IU/L (ref 44–121)
BUN/Creatinine Ratio: 20 (ref 12–28)
BUN: 31 mg/dL (ref 10–36)
Bilirubin Total: 0.3 mg/dL (ref 0.0–1.2)
CO2: 25 mmol/L (ref 20–29)
Calcium: 9.1 mg/dL (ref 8.7–10.3)
Chloride: 98 mmol/L (ref 96–106)
Creatinine, Ser: 1.52 mg/dL — ABNORMAL HIGH (ref 0.57–1.00)
Globulin, Total: 2 g/dL (ref 1.5–4.5)
Glucose: 89 mg/dL (ref 65–99)
Potassium: 4.6 mmol/L (ref 3.5–5.2)
Sodium: 137 mmol/L (ref 134–144)
Total Protein: 6.5 g/dL (ref 6.0–8.5)
eGFR: 32 mL/min/{1.73_m2} — ABNORMAL LOW (ref >59)

## 2021-06-22 LAB — TSH: TSH: 1.74 u[IU]/mL (ref 0.450–4.500)

## 2021-06-22 NOTE — Patient Instructions (Signed)
Medication Instructions:  Your physician recommends that you continue on your current medications as directed. Please refer to the Current Medication list given to you today.  *If you need a refill on your cardiac medications before your next appointment, please call your pharmacy*   Lab Work: TODAY: CMET, CBC, TSH   If you have labs (blood work) drawn today and your tests are completely normal, you will receive your results only by: [place] (if you have MyChart) OR A paper copy in the mail If you have any lab test that is abnormal or we need to change your treatment, we will call you to review the results.   Follow-Up: At [facility], you and your health needs are our priority.  As part of our continuing mission to provide you with exceptional heart care, we have created designated Provider Care Teams.  These Care Teams include your primary Cardiologist (physician) and Advanced Practice Providers (APPs -  Physician Assistants and Nurse Practitioners) who all work together to provide you with the care you need, when you need it.  We recommend signing up for the patient portal called "MyChart".  Sign up information is provided on this After Visit Summary.  MyChart is used to connect with patients for Virtual Visits (Telemedicine).  Patients are able to view lab/test results, encounter notes, upcoming appointments, etc.  Non-urgent messages can be sent to your provider as well.   To learn more about what you can do with MyChart, go to [email or web address].    Your next appointment:   6 month(s)  The format for your next appointment:   In Person  Provider:   You may see Virl Axe, MD or one of the following Advanced Practice Providers on your designated Care Team:   Tommye Standard, [place] [facility] [place], [place]

## 2021-06-25 ENCOUNTER — Other Ambulatory Visit: Payer: Self-pay

## 2021-06-25 ENCOUNTER — Telehealth: Payer: Self-pay | Admitting: Orthopaedic Surgery

## 2021-06-25 DIAGNOSIS — I480 Paroxysmal atrial fibrillation: Secondary | ICD-10-CM

## 2021-06-25 NOTE — Telephone Encounter (Signed)
[place] patient

## 2021-06-25 NOTE — Telephone Encounter (Signed)
Pt son called fell and hurt her left arm and wrist. She has been complaining a lot about it and he would like to know if she could be seen sooner?   CB [id number] -- Vergia Alberts

## 2021-06-26 NOTE — Telephone Encounter (Signed)
Please advise 

## 2021-06-27 NOTE — Telephone Encounter (Signed)
Kathlee Nations can you please talk to dr. Erlinda Hong about seeing this pt. Dr. Lorin Mercy is out of the office next week

## 2021-07-02 DIAGNOSIS — Z20828 Contact with and (suspected) exposure to other viral communicable diseases: Secondary | ICD-10-CM | POA: Diagnosis not present

## 2021-07-04 ENCOUNTER — Ambulatory Visit: Payer: PPO | Admitting: Orthopaedic Surgery

## 2021-07-09 ENCOUNTER — Other Ambulatory Visit: Payer: PPO | Admitting: *Deleted

## 2021-07-09 ENCOUNTER — Other Ambulatory Visit: Payer: Self-pay

## 2021-07-09 DIAGNOSIS — I480 Paroxysmal atrial fibrillation: Secondary | ICD-10-CM | POA: Diagnosis not present

## 2021-07-09 LAB — BASIC METABOLIC PANEL
BUN/Creatinine Ratio: 21 (ref 12–28)
BUN: 29 mg/dL (ref 10–36)
CO2: 25 mmol/L (ref 20–29)
Calcium: 8.7 mg/dL (ref 8.7–10.3)
Chloride: 103 mmol/L (ref 96–106)
Creatinine, Ser: 1.41 mg/dL — ABNORMAL HIGH (ref 0.57–1.00)
Glucose: 88 mg/dL (ref 65–99)
Potassium: 4.3 mmol/L (ref 3.5–5.2)
Sodium: 140 mmol/L (ref 134–144)
eGFR: 35 mL/min/{1.73_m2} — ABNORMAL LOW (ref >59)

## 2021-07-11 ENCOUNTER — Telehealth: Payer: Self-pay | Admitting: Internal Medicine

## 2021-07-11 NOTE — Telephone Encounter (Signed)
Pt is calling to advise that she have not ever received a copy of her lab work even though she always asks for it. Pt would like for someone to send her copies of her labwork. Pt said she would like all the lab work mailed to her from [phone]  Please advise pt further

## 2021-07-11 NOTE — Telephone Encounter (Signed)
Shirley Friar, PA-C  07/10/2021  8:23 AM EDT     Stable labs.      Beryle Beams" [place], PA-C 07/10/2021 8:23 AM      The patient has been notified of the result and verbalized understanding.  All questions (if any) were answered.  Pt request a copy of this lab result to be mailed to her confirmed address on file.  Informed the pt I will mail this out today. Pt verbalized understanding and agrees with this plan. Pt was more than gracious for all the assistance provided.

## 2021-07-16 DIAGNOSIS — Z8616 Personal history of COVID-19: Secondary | ICD-10-CM | POA: Diagnosis not present

## 2021-07-18 ENCOUNTER — Ambulatory Visit (INDEPENDENT_AMBULATORY_CARE_PROVIDER_SITE_OTHER): Payer: PPO

## 2021-07-18 DIAGNOSIS — I442 Atrioventricular block, complete: Secondary | ICD-10-CM

## 2021-07-18 DIAGNOSIS — I5022 Chronic systolic (congestive) heart failure: Secondary | ICD-10-CM | POA: Diagnosis not present

## 2021-07-18 DIAGNOSIS — R609 Edema, unspecified: Secondary | ICD-10-CM | POA: Diagnosis not present

## 2021-07-18 LAB — CUP PACEART REMOTE DEVICE CHECK
Battery Remaining Longevity: 76 mo
Battery Remaining Percentage: 63 %
Battery Voltage: 3.01 V
Brady Statistic AP VP Percent: 13 %
Brady Statistic AP VS Percent: 1 %
Brady Statistic AS VP Percent: 87 %
Brady Statistic AS VS Percent: 1 %
Brady Statistic RA Percent Paced: 13 %
Brady Statistic RV Percent Paced: 99 %
Date Time Interrogation Session: 20220921020013
Implantable Lead Implant Date: 20181130
Implantable Lead Implant Date: 20181130
Implantable Lead Location: 753859
Implantable Lead Location: 753860
Implantable Pulse Generator Implant Date: 20181130
Lead Channel Impedance Value: 350 Ohm
Lead Channel Impedance Value: 510 Ohm
Lead Channel Pacing Threshold Amplitude: 0.75 V
Lead Channel Pacing Threshold Amplitude: 0.75 V
Lead Channel Pacing Threshold Pulse Width: 0.5 ms
Lead Channel Pacing Threshold Pulse Width: 0.5 ms
Lead Channel Sensing Intrinsic Amplitude: 3.4 mV
Lead Channel Sensing Intrinsic Amplitude: 9.2 mV
Lead Channel Setting Pacing Amplitude: 1 V
Lead Channel Setting Pacing Amplitude: 2 V
Lead Channel Setting Pacing Pulse Width: 0.5 ms
Lead Channel Setting Sensing Sensitivity: 5 mV
Pulse Gen Model: 2272
Pulse Gen Serial Number: 8969488

## 2021-07-20 ENCOUNTER — Other Ambulatory Visit (INDEPENDENT_AMBULATORY_CARE_PROVIDER_SITE_OTHER): Payer: Self-pay | Admitting: Otolaryngology

## 2021-07-20 MED ORDER — FLUTICASONE PROPIONATE 50 MCG/ACT NA SUSP: 2.0000 | Freq: Every day | NASAL | 6 refills | Status: AC

## 2021-07-25 NOTE — Progress Notes (Signed)
Remote pacemaker transmission.   

## 2021-07-30 ENCOUNTER — Encounter: Payer: PPO | Admitting: Physician Assistant

## 2021-08-06 ENCOUNTER — Other Ambulatory Visit: Payer: Self-pay | Admitting: Physician Assistant

## 2021-08-06 DIAGNOSIS — I480 Paroxysmal atrial fibrillation: Secondary | ICD-10-CM

## 2021-08-06 NOTE — Telephone Encounter (Signed)
Prescription refill request for Xarelto received.  Indication: Afib  Last office visit:06/22/21 ([place])  Weight: 56.7kg Age: 87 Scr: 1.41 (07/09/21) CrCl: 23.8ml/min  Appropriate dose and refill sent to requested pharmacy.

## 2021-08-08 DIAGNOSIS — R300 Dysuria: Secondary | ICD-10-CM | POA: Diagnosis not present

## 2021-08-20 DIAGNOSIS — Z8616 Personal history of COVID-19: Secondary | ICD-10-CM | POA: Diagnosis not present

## 2021-09-03 DIAGNOSIS — Z20828 Contact with and (suspected) exposure to other viral communicable diseases: Secondary | ICD-10-CM | POA: Diagnosis not present

## 2021-09-17 DIAGNOSIS — Z20828 Contact with and (suspected) exposure to other viral communicable diseases: Secondary | ICD-10-CM | POA: Diagnosis not present

## 2021-10-01 DIAGNOSIS — I351 Nonrheumatic aortic (valve) insufficiency: Secondary | ICD-10-CM | POA: Diagnosis not present

## 2021-10-01 DIAGNOSIS — Z20828 Contact with and (suspected) exposure to other viral communicable diseases: Secondary | ICD-10-CM | POA: Diagnosis not present

## 2021-10-01 DIAGNOSIS — N1832 Chronic kidney disease, stage 3b: Secondary | ICD-10-CM | POA: Diagnosis not present

## 2021-10-01 DIAGNOSIS — F324 Major depressive disorder, single episode, in partial remission: Secondary | ICD-10-CM | POA: Diagnosis not present

## 2021-10-01 DIAGNOSIS — Z1159 Encounter for screening for other viral diseases: Secondary | ICD-10-CM | POA: Diagnosis not present

## 2021-10-01 DIAGNOSIS — Z0000 Encounter for general adult medical examination without abnormal findings: Secondary | ICD-10-CM | POA: Diagnosis not present

## 2021-10-01 DIAGNOSIS — N1831 Chronic kidney disease, stage 3a: Secondary | ICD-10-CM | POA: Diagnosis not present

## 2021-10-01 DIAGNOSIS — I471 Supraventricular tachycardia: Secondary | ICD-10-CM | POA: Diagnosis not present

## 2021-10-01 DIAGNOSIS — E46 Unspecified protein-calorie malnutrition: Secondary | ICD-10-CM | POA: Diagnosis not present

## 2021-10-01 DIAGNOSIS — Z79899 Other long term (current) drug therapy: Secondary | ICD-10-CM | POA: Diagnosis not present

## 2021-10-01 DIAGNOSIS — I700 Atherosclerosis of aorta: Secondary | ICD-10-CM | POA: Diagnosis not present

## 2021-10-01 DIAGNOSIS — D692 Other nonthrombocytopenic purpura: Secondary | ICD-10-CM | POA: Diagnosis not present

## 2021-10-01 DIAGNOSIS — F419 Anxiety disorder, unspecified: Secondary | ICD-10-CM | POA: Diagnosis not present

## 2021-10-01 DIAGNOSIS — Z950 Presence of cardiac pacemaker: Secondary | ICD-10-CM | POA: Diagnosis not present

## 2021-10-08 ENCOUNTER — Ambulatory Visit
Admission: RE | Admit: 2021-10-08 | Discharge: 2021-10-08 | Disposition: A | Discharge status: Home / Self Care | Payer: PPO | Source: Ambulatory Visit | Attending: Geriatric Medicine | Admitting: Geriatric Medicine

## 2021-10-08 ENCOUNTER — Other Ambulatory Visit: Payer: Self-pay | Admitting: Geriatric Medicine

## 2021-10-08 DIAGNOSIS — R059 Cough, unspecified: Secondary | ICD-10-CM | POA: Diagnosis not present

## 2021-10-10 DIAGNOSIS — Z20828 Contact with and (suspected) exposure to other viral communicable diseases: Secondary | ICD-10-CM | POA: Diagnosis not present

## 2021-10-10 DIAGNOSIS — Z1159 Encounter for screening for other viral diseases: Secondary | ICD-10-CM | POA: Diagnosis not present

## 2021-10-12 DIAGNOSIS — Z1159 Encounter for screening for other viral diseases: Secondary | ICD-10-CM | POA: Diagnosis not present

## 2021-10-12 DIAGNOSIS — Z20828 Contact with and (suspected) exposure to other viral communicable diseases: Secondary | ICD-10-CM | POA: Diagnosis not present

## 2021-10-15 DIAGNOSIS — Z1159 Encounter for screening for other viral diseases: Secondary | ICD-10-CM | POA: Diagnosis not present

## 2021-10-15 DIAGNOSIS — Z20828 Contact with and (suspected) exposure to other viral communicable diseases: Secondary | ICD-10-CM | POA: Diagnosis not present

## 2021-10-17 ENCOUNTER — Ambulatory Visit (INDEPENDENT_AMBULATORY_CARE_PROVIDER_SITE_OTHER): Payer: PPO

## 2021-10-17 DIAGNOSIS — I442 Atrioventricular block, complete: Secondary | ICD-10-CM | POA: Diagnosis not present

## 2021-10-17 DIAGNOSIS — Z20828 Contact with and (suspected) exposure to other viral communicable diseases: Secondary | ICD-10-CM | POA: Diagnosis not present

## 2021-10-17 DIAGNOSIS — Z1159 Encounter for screening for other viral diseases: Secondary | ICD-10-CM | POA: Diagnosis not present

## 2021-10-17 LAB — CUP PACEART REMOTE DEVICE CHECK
Battery Remaining Longevity: 73 mo
Battery Remaining Percentage: 60 %
Battery Voltage: 2.99 V
Brady Statistic AP VP Percent: 10 %
Brady Statistic AP VS Percent: 1 %
Brady Statistic AS VP Percent: 90 %
Brady Statistic AS VS Percent: 1 %
Brady Statistic RA Percent Paced: 10 %
Brady Statistic RV Percent Paced: 99 %
Date Time Interrogation Session: 20221221020015
Implantable Lead Implant Date: 20181130
Implantable Lead Implant Date: 20181130
Implantable Lead Location: 753859
Implantable Lead Location: 753860
Implantable Pulse Generator Implant Date: 20181130
Lead Channel Impedance Value: 390 Ohm
Lead Channel Impedance Value: 480 Ohm
Lead Channel Pacing Threshold Amplitude: 0.75 V
Lead Channel Pacing Threshold Amplitude: 0.75 V
Lead Channel Pacing Threshold Pulse Width: 0.5 ms
Lead Channel Pacing Threshold Pulse Width: 0.5 ms
Lead Channel Sensing Intrinsic Amplitude: 3.1 mV
Lead Channel Sensing Intrinsic Amplitude: 7.4 mV
Lead Channel Setting Pacing Amplitude: 1 V
Lead Channel Setting Pacing Amplitude: 2 V
Lead Channel Setting Pacing Pulse Width: 0.5 ms
Lead Channel Setting Sensing Sensitivity: 5 mV
Pulse Gen Model: 2272
Pulse Gen Serial Number: 8969488

## 2021-10-26 NOTE — Progress Notes (Signed)
Remote pacemaker transmission.   

## 2021-11-17 ENCOUNTER — Other Ambulatory Visit: Payer: Self-pay | Admitting: Internal Medicine

## 2021-11-19 DIAGNOSIS — Z20828 Contact with and (suspected) exposure to other viral communicable diseases: Secondary | ICD-10-CM | POA: Diagnosis not present

## 2021-11-28 ENCOUNTER — Other Ambulatory Visit: Payer: Self-pay | Admitting: Internal Medicine

## 2021-11-28 DIAGNOSIS — I480 Paroxysmal atrial fibrillation: Secondary | ICD-10-CM

## 2021-11-28 NOTE — Telephone Encounter (Signed)
Prescription refill request for Xarelto received.  Indication: Afib  Last office visit: 06/22/21 [place])  Weight: 65.7kg Age: 88 Scr: 1.41 (07/09/21)  CrCl: 27.59ml/min  Appropriate dose and refill sent to requested pharmacy.

## 2021-12-03 DIAGNOSIS — H6123 Impacted cerumen, bilateral: Secondary | ICD-10-CM | POA: Diagnosis not present

## 2021-12-04 ENCOUNTER — Other Ambulatory Visit: Payer: Self-pay | Admitting: Internal Medicine

## 2021-12-21 ENCOUNTER — Other Ambulatory Visit: Payer: Self-pay | Admitting: Internal Medicine

## 2021-12-31 ENCOUNTER — Encounter: Payer: PPO | Admitting: Internal Medicine

## 2022-01-16 ENCOUNTER — Ambulatory Visit (INDEPENDENT_AMBULATORY_CARE_PROVIDER_SITE_OTHER): Payer: PPO

## 2022-01-16 DIAGNOSIS — I442 Atrioventricular block, complete: Secondary | ICD-10-CM

## 2022-01-17 LAB — CUP PACEART REMOTE DEVICE CHECK
Battery Remaining Longevity: 69 mo
Battery Remaining Percentage: 58 %
Battery Voltage: 2.99 V
Brady Statistic AP VP Percent: 12 %
Brady Statistic AP VS Percent: 1 %
Brady Statistic AS VP Percent: 88 %
Brady Statistic AS VS Percent: 1 %
Brady Statistic RA Percent Paced: 12 %
Brady Statistic RV Percent Paced: 99 %
Date Time Interrogation Session: 20230322020015
Implantable Lead Implant Date: 20181130
Implantable Lead Implant Date: 20181130
Implantable Lead Location: 753859
Implantable Lead Location: 753860
Implantable Pulse Generator Implant Date: 20181130
Lead Channel Impedance Value: 350 Ohm
Lead Channel Impedance Value: 480 Ohm
Lead Channel Pacing Threshold Amplitude: 0.75 V
Lead Channel Pacing Threshold Amplitude: 0.75 V
Lead Channel Pacing Threshold Pulse Width: 0.5 ms
Lead Channel Pacing Threshold Pulse Width: 0.5 ms
Lead Channel Sensing Intrinsic Amplitude: 3.1 mV
Lead Channel Sensing Intrinsic Amplitude: 7.4 mV
Lead Channel Setting Pacing Amplitude: 1 V
Lead Channel Setting Pacing Amplitude: 2 V
Lead Channel Setting Pacing Pulse Width: 0.5 ms
Lead Channel Setting Sensing Sensitivity: 5 mV
Pulse Gen Model: 2272
Pulse Gen Serial Number: 8969488

## 2022-01-30 NOTE — Progress Notes (Signed)
Remote pacemaker transmission.   

## 2022-03-04 ENCOUNTER — Other Ambulatory Visit: Payer: Self-pay

## 2022-03-04 MED ORDER — CARVEDILOL 12.5 MG PO TABS: 12.5000 mg | ORAL_TABLET | Freq: Two times a day (BID) | ORAL | 0 refills | Status: DC

## 2022-03-09 ENCOUNTER — Other Ambulatory Visit: Payer: Self-pay | Admitting: Internal Medicine

## 2022-03-09 DIAGNOSIS — I480 Paroxysmal atrial fibrillation: Secondary | ICD-10-CM

## 2022-03-11 NOTE — Telephone Encounter (Signed)
Prescription refill request for Xarelto received.  ?Indication: PAF ?Last office visit: 06/22/21  Claudina Lick PA-C ?Weight: 56.7kg ?Age: 88 ?Scr: 1.41 on 07/09/21 ?CrCl: 23.26 ? ?Based on above findings Xarelto '15mg'$  daily is the appropriate dose.  Refill approved. ? ?

## 2022-03-27 ENCOUNTER — Other Ambulatory Visit: Payer: Self-pay | Admitting: Internal Medicine

## 2022-04-03 ENCOUNTER — Encounter: Payer: PPO | Admitting: Internal Medicine

## 2022-04-03 DIAGNOSIS — I480 Paroxysmal atrial fibrillation: Secondary | ICD-10-CM

## 2022-04-03 DIAGNOSIS — Z950 Presence of cardiac pacemaker: Secondary | ICD-10-CM

## 2022-04-03 DIAGNOSIS — I442 Atrioventricular block, complete: Secondary | ICD-10-CM

## 2022-04-17 ENCOUNTER — Ambulatory Visit (INDEPENDENT_AMBULATORY_CARE_PROVIDER_SITE_OTHER): Payer: PPO

## 2022-04-17 DIAGNOSIS — I442 Atrioventricular block, complete: Secondary | ICD-10-CM

## 2022-04-19 LAB — CUP PACEART REMOTE DEVICE CHECK
Battery Remaining Longevity: 65 mo
Battery Remaining Percentage: 56 %
Battery Voltage: 2.99 V
Brady Statistic AP VP Percent: 14 %
Brady Statistic AP VS Percent: 1 %
Brady Statistic AS VP Percent: 82 %
Brady Statistic AS VS Percent: 3.2 %
Brady Statistic RA Percent Paced: 14 %
Brady Statistic RV Percent Paced: 97 %
Date Time Interrogation Session: 20230621020021
Implantable Lead Implant Date: 20181130
Implantable Lead Implant Date: 20181130
Implantable Lead Location: 753859
Implantable Lead Location: 753860
Implantable Pulse Generator Implant Date: 20181130
Lead Channel Impedance Value: 380 Ohm
Lead Channel Impedance Value: 440 Ohm
Lead Channel Pacing Threshold Amplitude: 0.75 V
Lead Channel Pacing Threshold Amplitude: 0.875 V
Lead Channel Pacing Threshold Pulse Width: 0.5 ms
Lead Channel Pacing Threshold Pulse Width: 0.5 ms
Lead Channel Sensing Intrinsic Amplitude: 2.4 mV
Lead Channel Sensing Intrinsic Amplitude: 8.4 mV
Lead Channel Setting Pacing Amplitude: 1.125
Lead Channel Setting Pacing Amplitude: 2 V
Lead Channel Setting Pacing Pulse Width: 0.5 ms
Lead Channel Setting Sensing Sensitivity: 5 mV
Pulse Gen Model: 2272
Pulse Gen Serial Number: 8969488

## 2022-04-29 NOTE — Progress Notes (Signed)
Remote pacemaker transmission.   

## 2022-05-04 ENCOUNTER — Other Ambulatory Visit: Payer: Self-pay | Admitting: Internal Medicine

## 2022-05-10 DIAGNOSIS — Z950 Presence of cardiac pacemaker: Secondary | ICD-10-CM | POA: Insufficient documentation

## 2022-05-13 ENCOUNTER — Ambulatory Visit (INDEPENDENT_AMBULATORY_CARE_PROVIDER_SITE_OTHER): Payer: PPO | Admitting: Internal Medicine

## 2022-05-13 ENCOUNTER — Encounter: Payer: Self-pay | Admitting: Internal Medicine

## 2022-05-13 VITALS — BP 118/74 | HR 67 | Ht 67.0 in | Wt 117.8 lb

## 2022-05-13 DIAGNOSIS — I442 Atrioventricular block, complete: Secondary | ICD-10-CM

## 2022-05-13 DIAGNOSIS — Z79899 Other long term (current) drug therapy: Secondary | ICD-10-CM | POA: Diagnosis not present

## 2022-05-13 DIAGNOSIS — Z950 Presence of cardiac pacemaker: Secondary | ICD-10-CM

## 2022-05-13 DIAGNOSIS — I480 Paroxysmal atrial fibrillation: Secondary | ICD-10-CM | POA: Diagnosis not present

## 2022-05-13 NOTE — Patient Instructions (Addendum)
Medication Instructions:  Your physician recommends that you continue on your current medications as directed. Please refer to the Current Medication list given to you today.  *If you need a refill on your cardiac medications before your next appointment, please call your pharmacy*   Lab Work: TSH and Liver Panel  If you have labs (blood work) drawn today and your tests are completely normal, you will receive your results only by: [place] (if you have MyChart) OR A paper copy in the mail If you have any lab test that is abnormal or we need to change your treatment, we will call you to review the results.   Testing/Procedures: None ordered.    Follow-Up: At [facility], you and your health needs are our priority.  As part of our continuing mission to provide you with exceptional heart care, we have created designated Provider Care Teams.  These Care Teams include your primary Cardiologist (physician) and Advanced Practice Providers (APPs -  Physician Assistants and Nurse Practitioners) who all work together to provide you with the care you need, when you need it.  We recommend signing up for the patient portal called "MyChart".  Sign up information is provided on this After Visit Summary.  MyChart is used to connect with patients for Virtual Visits (Telemedicine).  Patients are able to view lab/test results, encounter notes, upcoming appointments, etc.  Non-urgent messages can be sent to your provider as well.   To learn more about what you can do with MyChart, go to [email or web address].    Your next appointment:   12 months with Dr Caryl Comes or his PA-C  Important Information About Sugar

## 2022-05-13 NOTE — Progress Notes (Signed)
so      Patient Care Team: Lajean Manes, MD as PCP - General (Internal Medicine) Deboraha Sprang, MD as PCP - Electrophysiology (Cardiology)   HPI  Yvonne [name] is a 88 y.o. female Seen in followup for atrial fibrillation and complete heart block--pacemaker  St Jude  She was hospitalized 6/17 for atrial fibrillation with a rapid rate in the context of pneumonia. She was started on amiodarone.    She takes Rivaroxaban no bleeding  Seen 12/18 with dizziness and found to be in complete heart block.  She underwent dual chamber pacing.  DATE TEST EF   6/17 Echo   25-30 %   11/17 Echo  45         Some breathlessness.  Largely isolated.  Stays in her apartment at [facility].  Takes all of her meals in.  Issues with her younger daughter-  Husband of 22 yrs died fall 20-Jan-2018      Date Cr K Hgb TSH LFTs  8/17 0.8 4.6  1.92 56  3/19 1.01 3.8 12.7  18  12/22 1.52 4.0 12.8 1.8   7/23           Past Medical History:  Diagnosis Date   Anxiety state, unspecified    Arthritis    Cancer ([place])    basil cell carcinomas removed   Cataract    Complete heart block ([place])    a. s/p St. Jude PPM 09/26/17.   Esophageal reflux    Hemorrhage of gastrointestinal tract, unspecified    a. occurred on Aleve   History of peptic ulcer    LBBB (left bundle branch block)    Left ventricular systolic dysfunction    hx of mild   Mitral valve insufficiency and aortic valve insufficiency    a. Remote echo 2009-01-20: mild focal basal hypertrophy of septum, EF 50-55%, mid-distal inferoseptal myocardium, grade 1 DD, LBBB, mild AI/MR, PASP mildly increased.   PAF (paroxysmal atrial fibrillation) (HCC)    Paroxysmal supraventricular tachycardia (HCC)    Wears glasses     Past Surgical History:  Procedure Laterality Date   ABDOMINAL HYSTERECTOMY     APPENDECTOMY     BREAST SURGERY     biopsy   CATARACT EXTRACTION Left    ESOPHAGOGASTRODUODENOSCOPY     ESOPHAGOGASTRODUODENOSCOPY (EGD) WITH  PROPOFOL N/A 10/10/2015   Procedure: ESOPHAGOGASTRODUODENOSCOPY (EGD) WITH PROPOFOL;  Surgeon: Garlan Fair, MD;  Location: [facility];  Service: Endoscopy;  Laterality: N/A;   FOOT SURGERY Left    hammer toe and bunion   KNEE ARTHROSCOPY  01/21/08   left   OPEN REDUCTION INTERNAL FIXATION (ORIF) METACARPAL Left 11/05/2013   Procedure: CLOSED REDUCTION/PINNING VS OPEN REDUCTION INTERNAL FIXATION (ORIF) LEFT SMALL  METACARPAL FRACTURE ;  Surgeon: Cammie Sickle., MD;  Location: [facility];  Service: Orthopedics;  Laterality: Left;   ORIF HUMERUS FRACTURE Left 04/10/2015   Procedure: OPEN REDUCTION INTERNAL FIXATION (ORIF) PROXIMAL HUMERUS FRACTURE;  Surgeon: Marybelle Killings, MD;  Location: [facility];  Service: Orthopedics;  Laterality: Left;   PACEMAKER IMPLANT N/A 09/26/2017   Procedure: PACEMAKER IMPLANT;  Surgeon: Constance Haw, MD;  Location: [facility];  Service: Cardiovascular;  Laterality: N/A;   TONSILLECTOMY     TUBAL LIGATION      Current Outpatient Medications  Medication Sig Dispense Refill   acetaminophen (TYLENOL) 500 MG tablet Take 500-1,000 mg by mouth every 6 (six) hours as needed (for pain).  amiodarone (PACERONE) 200 MG tablet TAKE ONE-HALF TABLET DAILY 45 tablet 1   buPROPion (WELLBUTRIN XL) 300 MG 24 hr tablet Take 1 tablet by mouth daily.     carvedilol (COREG) 12.5 MG tablet TAKE ONE TABLET BY MOUTH TWICE DAILY 60 tablet 0   clorazepate (TRANXENE) 7.5 MG tablet Take 7.5 mg by mouth 3 (three) times daily as needed for anxiety.      estradiol (ESTRACE) 0.5 MG tablet Take 0.5 mg by mouth daily.     fluticasone (FLONASE) 50 MCG/ACT nasal spray Place 2 sprays into both nostrils daily as needed for allergies.   5   fluticasone (FLONASE) 50 MCG/ACT nasal spray Place 2 sprays into both nostrils daily. 2 sprays each nostril at night 16 g 6   furosemide (LASIX) 20 MG tablet TAKE TWO TABLETS EVERY DAY 180 tablet 3   ondansetron (ZOFRAN) 8 MG tablet  Take 8 mg by mouth every 8 (eight) hours as needed for nausea or vomiting.     potassium chloride (K-DUR) 10 MEQ tablet Take 10 mEq by mouth 2 (two) times daily.     Rivaroxaban (XARELTO) 15 MG TABS tablet TAKE ONE TABLET DAILY WITH SUPPER 90 tablet 1   sodium chloride (OCEAN) 0.65 % SOLN nasal spray Place 2 sprays into both nostrils daily as needed for congestion.     No current facility-administered medications for this visit.    Allergies  Allergen Reactions   Aleve [Naproxen Sodium] Other (See Comments)    Gi bleed   Ambien [Zolpidem] Other (See Comments)    Pt states "made me have crazy thoughts"   Flexeril [Cyclobenzaprine] Other (See Comments)    Urinary rentention   Adhesive [Tape] Rash and Other (See Comments)    Skin redness   Aspirin Other (See Comments)    Told to avoid high doses due to history of stomach ulcer    Review of Systems negative except from HPI and PMH  Physical Exam BP 118/74   Pulse 67   Ht '5\' 7"'$  (1.702 m)   Wt 117 lb 12.8 oz (53.4 kg)   SpO2 99%   BMI 18.45 kg/m     Well developed and well nourished in no acute distress HENT normal Neck supple with JVP-flat Clear Device pocket well healed; without hematoma or erythema.  There is no tethering  Regular rate and rhythm, no  murmur Abd-soft with active BS No Clubbing cyanosis  edema Skin-warm and dry A & Oriented  Grossly normal sensory and motor function  ECG sinus rhythm with P synchronous pacing   Assessment and  Plan  SVT  Left bundle branch block complete heart block-intermittent  High risk medication surveillance  Mitral regurgitation Severe>>mild //mod   Cardiomyopathy-Nonischemic  25>>40-45%  Congestive heart failure-chronic-systolic/diastolic  Complete heart block  Pacemaker    Atrial fibrillation   Falls    No atrial arrhythmias that are clinically significant.  Trivial mode switch burden.  Continue amiodarone at 100 mg a day and rivaroxaban at 15 mg a day we will  check a CBC and amiodarone surveillance laboratories.  Continues with some falls.  Heart block is intermittent.  We have reprogrammed the device to activate VIP   Euvolemic.  Continue Lasix 40 mg daily  Lengthy discussion regarding the transitions of life related to the death of her husband, her being at [employer], the isolation and the loneliness and the depression.  Lets talk rate, I been meaning to call you and talk with you about [place]  POTS are I to them thinking of being there the first part of that week Lelan Pons and I also are thinking about

## 2022-05-14 LAB — HEPATIC FUNCTION PANEL
ALT: 11 IU/L (ref 0–32)
AST: 19 IU/L (ref 0–40)
Albumin: 4.5 g/dL (ref 3.6–4.6)
Alkaline Phosphatase: 67 IU/L (ref 44–121)
Bilirubin Total: 0.3 mg/dL (ref 0.0–1.2)
Bilirubin, Direct: 0.1 mg/dL (ref 0.00–0.40)
Total Protein: 6.9 g/dL (ref 6.0–8.5)

## 2022-05-14 LAB — TSH: TSH: 1.4 u[IU]/mL (ref 0.450–4.500)

## 2022-06-05 ENCOUNTER — Other Ambulatory Visit: Payer: Self-pay | Admitting: Internal Medicine

## 2022-06-10 ENCOUNTER — Telehealth: Payer: Self-pay | Admitting: Internal Medicine

## 2022-06-10 NOTE — Telephone Encounter (Signed)
Pt would like a callback regarding whether or not she is able to have a procedure done. Please advise

## 2022-06-10 NOTE — Telephone Encounter (Signed)
Spoke with pt who states her dentist has recommended she have some oral surgery where she will have to be "put to sleep."  Pt is concerned re: procedure and does not think she will go forward.  Pt advised if she decides to move forward with procedure her oral surgeon will need to submit a clearance request for pt.  Oral surgeon's office should be familiar with clearance procedure for pt.  Pt verbalizes understanding and thanked Therapist, sports for the call.

## 2022-06-21 ENCOUNTER — Other Ambulatory Visit: Payer: Self-pay | Admitting: Internal Medicine

## 2022-07-17 ENCOUNTER — Ambulatory Visit (INDEPENDENT_AMBULATORY_CARE_PROVIDER_SITE_OTHER): Payer: PPO

## 2022-07-17 DIAGNOSIS — I442 Atrioventricular block, complete: Secondary | ICD-10-CM

## 2022-07-17 LAB — CUP PACEART REMOTE DEVICE CHECK
Battery Remaining Longevity: 65 mo
Battery Remaining Percentage: 53 %
Battery Voltage: 2.99 V
Brady Statistic AP VP Percent: 3.5 %
Brady Statistic AP VS Percent: 15 %
Brady Statistic AS VP Percent: 12 %
Brady Statistic AS VS Percent: 69 %
Brady Statistic RA Percent Paced: 19 %
Brady Statistic RV Percent Paced: 15 %
Date Time Interrogation Session: 20230920020029
Implantable Lead Implant Date: 20181130
Implantable Lead Implant Date: 20181130
Implantable Lead Location: 753859
Implantable Lead Location: 753860
Implantable Pulse Generator Implant Date: 20181130
Lead Channel Impedance Value: 350 Ohm
Lead Channel Impedance Value: 450 Ohm
Lead Channel Pacing Threshold Amplitude: 0.875 V
Lead Channel Pacing Threshold Amplitude: 1 V
Lead Channel Pacing Threshold Pulse Width: 0.5 ms
Lead Channel Pacing Threshold Pulse Width: 0.5 ms
Lead Channel Sensing Intrinsic Amplitude: 2.6 mV
Lead Channel Sensing Intrinsic Amplitude: 9.2 mV
Lead Channel Setting Pacing Amplitude: 1.125
Lead Channel Setting Pacing Amplitude: 2 V
Lead Channel Setting Pacing Pulse Width: 0.5 ms
Lead Channel Setting Sensing Sensitivity: 2 mV
Pulse Gen Model: 2272
Pulse Gen Serial Number: 8969488

## 2022-07-29 ENCOUNTER — Other Ambulatory Visit: Payer: Self-pay | Admitting: Internal Medicine

## 2022-07-29 ENCOUNTER — Ambulatory Visit
Admission: RE | Admit: 2022-07-29 | Discharge: 2022-07-29 | Disposition: A | Discharge status: Home / Self Care | Payer: PPO | Source: Ambulatory Visit | Attending: Internal Medicine | Admitting: Internal Medicine

## 2022-07-29 DIAGNOSIS — R0789 Other chest pain: Secondary | ICD-10-CM

## 2022-07-29 DIAGNOSIS — M25552 Pain in left hip: Secondary | ICD-10-CM

## 2022-07-29 DIAGNOSIS — Z950 Presence of cardiac pacemaker: Secondary | ICD-10-CM | POA: Diagnosis not present

## 2022-07-29 DIAGNOSIS — S2242XA Multiple fractures of ribs, left side, initial encounter for closed fracture: Secondary | ICD-10-CM | POA: Diagnosis not present

## 2022-07-29 DIAGNOSIS — M25559 Pain in unspecified hip: Secondary | ICD-10-CM | POA: Diagnosis not present

## 2022-07-30 NOTE — Progress Notes (Signed)
Remote pacemaker transmission.   

## 2022-09-01 ENCOUNTER — Emergency Department (HOSPITAL_COMMUNITY): Payer: PPO

## 2022-09-01 ENCOUNTER — Other Ambulatory Visit: Payer: Self-pay

## 2022-09-01 ENCOUNTER — Emergency Department (HOSPITAL_COMMUNITY)
Admission: EM | Admit: 2022-09-01 | Discharge: 2022-09-01 | Disposition: A | Discharge status: Skilled Nursing Facility | Payer: PPO | Attending: Emergency Medicine | Admitting: Emergency Medicine

## 2022-09-01 DIAGNOSIS — W19XXXA Unspecified fall, initial encounter: Secondary | ICD-10-CM | POA: Diagnosis not present

## 2022-09-01 DIAGNOSIS — W01198A Fall on same level from slipping, tripping and stumbling with subsequent striking against other object, initial encounter: Secondary | ICD-10-CM | POA: Insufficient documentation

## 2022-09-01 DIAGNOSIS — S0101XA Laceration without foreign body of scalp, initial encounter: Secondary | ICD-10-CM | POA: Diagnosis not present

## 2022-09-01 DIAGNOSIS — S0990XA Unspecified injury of head, initial encounter: Secondary | ICD-10-CM | POA: Diagnosis present

## 2022-09-01 DIAGNOSIS — I4891 Unspecified atrial fibrillation: Secondary | ICD-10-CM | POA: Insufficient documentation

## 2022-09-01 DIAGNOSIS — S0102XA Laceration with foreign body of scalp, initial encounter: Secondary | ICD-10-CM | POA: Diagnosis not present

## 2022-09-01 DIAGNOSIS — E876 Hypokalemia: Secondary | ICD-10-CM | POA: Insufficient documentation

## 2022-09-01 DIAGNOSIS — Z23 Encounter for immunization: Secondary | ICD-10-CM | POA: Insufficient documentation

## 2022-09-01 DIAGNOSIS — G8929 Other chronic pain: Secondary | ICD-10-CM | POA: Diagnosis not present

## 2022-09-01 DIAGNOSIS — Z7901 Long term (current) use of anticoagulants: Secondary | ICD-10-CM | POA: Insufficient documentation

## 2022-09-01 DIAGNOSIS — Z043 Encounter for examination and observation following other accident: Secondary | ICD-10-CM | POA: Diagnosis not present

## 2022-09-01 LAB — COMPREHENSIVE METABOLIC PANEL
ALT: 15 U/L (ref 0–44)
AST: 21 U/L (ref 15–41)
Albumin: 3.8 g/dL (ref 3.5–5.0)
Alkaline Phosphatase: 49 U/L (ref 38–126)
Anion gap: 11 (ref 5–15)
BUN: 43 mg/dL — ABNORMAL HIGH (ref 8–23)
CO2: 24 mmol/L (ref 22–32)
Calcium: 9.2 mg/dL (ref 8.9–10.3)
Chloride: 103 mmol/L (ref 98–111)
Creatinine, Ser: 1.18 mg/dL — ABNORMAL HIGH (ref 0.44–1.00)
GFR, Estimated: 44 mL/min — ABNORMAL LOW (ref >60)
Glucose, Bld: 97 mg/dL (ref 70–99)
Potassium: 3.3 mmol/L — ABNORMAL LOW (ref 3.5–5.1)
Sodium: 138 mmol/L (ref 135–145)
Total Bilirubin: 0.4 mg/dL (ref 0.3–1.2)
Total Protein: 6.1 g/dL — ABNORMAL LOW (ref 6.5–8.1)

## 2022-09-01 LAB — CBC WITH DIFFERENTIAL/PLATELET
Abs Immature Granulocytes: 0.08 10*3/uL — ABNORMAL HIGH (ref 0.00–0.07)
Basophils Absolute: 0 10*3/uL (ref 0.0–0.1)
Basophils Relative: 1 %
Eosinophils Absolute: 0.1 10*3/uL (ref 0.0–0.5)
Eosinophils Relative: 1 %
HCT: 38.1 % (ref 36.0–46.0)
Hemoglobin: 12 g/dL (ref 12.0–15.0)
Immature Granulocytes: 1 %
Lymphocytes Relative: 22 %
Lymphs Abs: 1.3 10*3/uL (ref 0.7–4.0)
MCH: 31.1 pg (ref 26.0–34.0)
MCHC: 31.5 g/dL (ref 30.0–36.0)
MCV: 98.7 fL (ref 80.0–100.0)
Monocytes Absolute: 0.5 10*3/uL (ref 0.1–1.0)
Monocytes Relative: 8 %
Neutro Abs: 4.2 10*3/uL (ref 1.7–7.7)
Neutrophils Relative %: 67 %
Platelets: 249 10*3/uL (ref 150–400)
RBC: 3.86 MIL/uL — ABNORMAL LOW (ref 3.87–5.11)
RDW: 13 % (ref 11.5–15.5)
WBC: 6.2 10*3/uL (ref 4.0–10.5)
nRBC: 0 % (ref 0.0–0.2)

## 2022-09-01 LAB — PROTIME-INR
INR: 1.8 — ABNORMAL HIGH (ref 0.8–1.2)
Prothrombin Time: 20.4 seconds — ABNORMAL HIGH (ref 11.4–15.2)

## 2022-09-01 MED ORDER — LIDOCAINE-EPINEPHRINE (PF) 2 %-1:200000 IJ SOLN
10.0000 mL | Freq: Once | INTRAMUSCULAR | Status: AC
  Administered 2022-09-01: 10 mL via INTRADERMAL
  Filled 2022-09-01: qty 20

## 2022-09-01 MED ORDER — ACETAMINOPHEN 325 MG PO TABS
650.0000 mg | ORAL_TABLET | Freq: Once | ORAL | Status: AC
  Administered 2022-09-01: 650 mg via ORAL
  Filled 2022-09-01: qty 2

## 2022-09-01 MED ORDER — TETANUS-DIPHTH-ACELL PERTUSSIS 5-2.5-18.5 LF-MCG/0.5 IM SUSY
0.5000 mL | PREFILLED_SYRINGE | Freq: Once | INTRAMUSCULAR | Status: AC
  Administered 2022-09-01: 0.5 mL via INTRAMUSCULAR
  Filled 2022-09-01: qty 0.5

## 2022-09-01 NOTE — ED Notes (Signed)
Patient verbalizes understanding of d/c instructions. Opportunities for questions and answers were provided. Pt d/c from ED and wheeled to lobby with son who is taking pt back to [facility].

## 2022-09-01 NOTE — ED Notes (Signed)
Patient transported to CT 

## 2022-09-01 NOTE — Discharge Instructions (Signed)
You were seen in the ER after your fall. Your laceration was repaired with 6 staples, which will need to be removed by your PCP/ urgent care / the ER in the next 5-7 days. Please keep the area clean and dry. You may shower like normal. Return to the ER with any redness swelling, pus-like drainage from the area or any other new severe symptoms.

## 2022-09-01 NOTE — ED Triage Notes (Signed)
Pt BIB [facility] from [facility] for fall on thinners (level 2 trauma). Pt had a mechanical fall and fell backwards. Pt did hit head, appx 6in U-shape lac to back on head. Denies LOC. VSS, AO4

## 2022-09-01 NOTE — ED Notes (Signed)
Pt ambulated with assistance, no complaints.

## 2022-09-01 NOTE — Progress Notes (Signed)
Orthopedic Tech Progress Note Patient Details:  Yvonne [name] [birth year] [id number]  Patient ID: [name], female   DOB: [birth year], 88 y.o.   MRN: [id number] I attended trauma page. Karolee Stamps 09/01/2022, 2:55 AM

## 2022-09-01 NOTE — ED Notes (Signed)
Unable to take pt to CT d/t CT being busy at this time.

## 2022-09-01 NOTE — ED Provider Notes (Signed)
[facility] EMERGENCY DEPARTMENT Provider Note   CSN: [id number] Arrival date & time: 09/01/22  0155     History  Chief Complaint  Patient presents with   Fall on thinners    Yvonne [name] is a 88 y.o. female who presents via EMS from [facility] independent living after a fall where she struck her head, anticoagulated on Xarelto for atrial fibrillation.  Patient denies losing consciousness, denies feeling dizzy or lightheaded prior to this.  Her son at the bedside contributes history the patient has recurrent falls frequently, multiple times per week.  Patient with U-shaped laceration of the top of the head, hemostatic at time of arrival.  Patient endorsing mild pain in her head but otherwise no symptoms.  Ambulatory independently at baseline.  Patient states she fell flat on her back, appears to have struck her head on the way down on nearby coffee table.  I personally reviewed medical records.  Has history of mitral regurgitation, left bundle branch block, A-fib with RVR in the past, nonischemic cardiomyopathy, and pacemaker.  She is anticoagulated on Xarelto as previously mentioned.  HPI     Home Medications Prior to Admission medications   Medication Sig Start Date End Date Taking? Authorizing Provider  acetaminophen (TYLENOL) 500 MG tablet Take 500-1,000 mg by mouth every 6 (six) hours as needed (for pain).    [provider]  amiodarone (PACERONE) 200 MG tablet TAKE ONE-HALF TABLET DAILY 06/21/22   Deboraha Sprang, MD  buPROPion (WELLBUTRIN XL) 300 MG 24 hr tablet Take 1 tablet by mouth daily. 04/12/19   [provider]  carvedilol (COREG) 12.5 MG tablet TAKE ONE TABLET BY MOUTH TWICE DAILY 06/05/22   Deboraha Sprang, MD  clorazepate (TRANXENE) 7.5 MG tablet Take 7.5 mg by mouth 3 (three) times daily as needed for anxiety.     [provider]  estradiol (ESTRACE) 0.5 MG tablet Take 0.5 mg by mouth daily.    [provider]   fluticasone (FLONASE) 50 MCG/ACT nasal spray Place 2 sprays into both nostrils daily as needed for allergies.  02/25/15   [provider]  fluticasone (FLONASE) 50 MCG/ACT nasal spray Place 2 sprays into both nostrils daily. 2 sprays each nostril at night 07/20/21   Rozetta Nunnery, MD  furosemide (LASIX) 20 MG tablet TAKE TWO TABLETS EVERY DAY 12/21/21   Deboraha Sprang, MD  ondansetron (ZOFRAN) 8 MG tablet Take 8 mg by mouth every 8 (eight) hours as needed for nausea or vomiting.    [provider]  potassium chloride (K-DUR) 10 MEQ tablet Take 10 mEq by mouth 2 (two) times daily. 11/04/17   [provider]  Rivaroxaban (XARELTO) 15 MG TABS tablet TAKE ONE TABLET DAILY WITH SUPPER 03/11/22   Deboraha Sprang, MD  sodium chloride (OCEAN) 0.65 % SOLN nasal spray Place 2 sprays into both nostrils daily as needed for congestion.    [provider]      Allergies    Aleve [naproxen sodium], Ambien [zolpidem], Flexeril [cyclobenzaprine], Adhesive [tape], and Aspirin    Review of Systems   Review of Systems  Constitutional: Negative.   HENT: Negative.    Eyes: Negative.   Respiratory: Negative.    Cardiovascular: Negative.   Skin:  Positive for wound.  Neurological:  Positive for headaches. Negative for dizziness and light-headedness.    Physical Exam Updated Vital Signs BP 123/64   Pulse 62   Temp 97.6 F (36.4 C) (Oral)  Resp 16   Ht 5' 6.5" (1.689 m)   Wt 52.2 kg   SpO2 96%   BMI 18.28 kg/m  Physical Exam Vitals and nursing note reviewed.  Constitutional:      Appearance: She is not ill-appearing or toxic-appearing.  HENT:     Head: Normocephalic and atraumatic.      Right Ear: No hemotympanum.     Left Ear: No hemotympanum.     Nose:     Right Nostril: No septal hematoma.     Left Nostril: No septal hematoma.     Mouth/Throat:     Mouth: Mucous membranes are moist.     Pharynx: No oropharyngeal exudate or posterior oropharyngeal  erythema.  Eyes:     General:        Right eye: No discharge.        Left eye: No discharge.     Extraocular Movements: Extraocular movements intact.     Conjunctiva/sclera: Conjunctivae normal.     Pupils: Pupils are equal, round, and reactive to light.  Neck:     Trachea: Trachea and phonation normal.     Comments: No midline tenderness palpation of the C-spine, full active range of motion of the C-spine without pain.  No step-offs or hematomas. Cardiovascular:     Rate and Rhythm: Normal rate and regular rhythm.     Pulses: Normal pulses.     Heart sounds: Normal heart sounds. No murmur heard. Pulmonary:     Effort: Pulmonary effort is normal. No respiratory distress.     Breath sounds: Normal breath sounds. No wheezing or rales.  Abdominal:     General: Bowel sounds are normal. There is no distension.     Palpations: Abdomen is soft.     Tenderness: There is no abdominal tenderness. There is no guarding or rebound.  Musculoskeletal:        General: No deformity.     Cervical back: Full passive range of motion without pain, normal range of motion and neck supple. No edema, rigidity or crepitus. No pain with movement, spinous process tenderness or muscular tenderness.     Right lower leg: No edema.     Left lower leg: No edema.  Lymphadenopathy:     Cervical: No cervical adenopathy.  Skin:    General: Skin is warm and dry.     Capillary Refill: Capillary refill takes less than 2 seconds.     Findings: Wound present.  Neurological:     General: No focal deficit present.     Mental Status: She is alert and oriented to person, place, and time. Mental status is at baseline.  Psychiatric:        Mood and Affect: Mood normal.     ED Results / Procedures / Treatments   Labs (all labs ordered are listed, but only abnormal results are displayed) Labs Reviewed  CBC WITH DIFFERENTIAL/PLATELET - Abnormal; Notable for the following components:      Result Value   RBC 3.86 (*)     Abs Immature Granulocytes 0.08 (*)    All other components within normal limits  COMPREHENSIVE METABOLIC PANEL - Abnormal; Notable for the following components:   Potassium 3.3 (*)    BUN 43 (*)    Creatinine, Ser 1.18 (*)    Total Protein 6.1 (*)    GFR, Estimated 44 (*)    All other components within normal limits  PROTIME-INR - Abnormal; Notable for the following components:   Prothrombin Time 20.4 (*)  INR 1.8 (*)    All other components within normal limits    EKG None  Radiology CT Head Wo Contrast  Result Date: 09/01/2022 CLINICAL DATA:  Fall on blood thinners EXAM: CT HEAD WITHOUT CONTRAST TECHNIQUE: Contiguous axial images were obtained from the base of the skull through the vertex without intravenous contrast. RADIATION DOSE REDUCTION: This exam was performed according to the departmental dose-optimization program which includes automated exposure control, adjustment of the mA and/or kV according to patient size and/or use of iterative reconstruction technique. COMPARISON:  CT head 04/17/2027 FINDINGS: Brain: No intracranial hemorrhage, mass effect, or evidence of acute infarct. No hydrocephalus. No extra-axial fluid collection. Generalized cerebral atrophy. Ill-defined hypoattenuation within the cerebral white matter is nonspecific but consistent with chronic small vessel ischemic disease. Vascular: No hyperdense vessel. Intracranial arterial calcification. Skull: No fracture or focal lesion. Sinuses/Orbits: No acute finding. Paranasal sinuses and mastoid air cells are well aerated. Other: None. IMPRESSION: No acute intracranial abnormality. Atrophy with small-vessel ischemic change in the deep cerebral white matter. Electronically Signed   By: Placido Sou M.D.   On: 09/01/2022 03:40    Procedures .Critical Care  Performed by: Emeline Darling, PA-C Authorized by: Emeline Darling, PA-C   Critical care provider statement:    Critical care time (minutes):  45    Critical care was time spent personally by me on the following activities:  Development of treatment plan with patient or surrogate, discussions with consultants, evaluation of patient's response to treatment, examination of patient, obtaining history from patient or surrogate, ordering and performing treatments and interventions, ordering and review of laboratory studies, ordering and review of radiographic studies, pulse oximetry and re-evaluation of patient's condition .Marland KitchenLaceration Repair  Date/Time: 09/01/2022 5:06 AM  Performed by: Emeline Darling, PA-C Authorized by: Emeline Darling, PA-C   Consent:    Consent obtained:  Verbal   Consent given by:  Patient   Risks discussed:  Infection, pain and poor cosmetic result   Alternatives discussed:  Delayed treatment and no treatment Universal protocol:    Patient identity confirmed:  Verbally with patient and arm band Anesthesia:    Anesthesia method:  Local infiltration   Local anesthetic:  Lidocaine 2% WITH epi (2 cc total) Laceration details:    Location:  Scalp   Scalp location:  Crown   Length (cm):  7 Pre-procedure details:    Preparation:  Patient was prepped and draped in usual sterile fashion and imaging obtained to evaluate for foreign bodies Exploration:    Limited defect created (wound extended): no     Hemostasis achieved with:  Direct pressure and epinephrine   Imaging obtained comment:  CT   Imaging outcome: foreign body not noted     Wound exploration: wound explored through full range of motion and entire depth of wound visualized     Wound extent: no underlying fracture     Contaminated: no   Treatment:    Area cleansed with:  Saline and Shur-Clens   Amount of cleaning:  Extensive Skin repair:    Repair method:  Staples   Number of staples:  6 Approximation:    Approximation:  Close Repair type:    Repair type:  Intermediate Post-procedure details:    Dressing:  Open (no dressing)      Medications Ordered in ED Medications  acetaminophen (TYLENOL) tablet 650 mg (650 mg Oral Given 09/01/22 0249)  lidocaine-EPINEPHrine (XYLOCAINE W/EPI) 2 %-1:200000 (PF) injection 10 mL (10 mLs Intradermal Given  by Other 09/01/22 0454)  Tdap (BOOSTRIX) injection 0.5 mL (0.5 mLs Intramuscular Given 09/01/22 0459)    ED Course/ Medical Decision Making/ A&P                           Medical Decision Making 88 year old female presents with concern for fall on blood thinners with head trauma.  Vital signs reassuring intake.  Cardiopulmonary abdominal signs are benign.  No evidence of injuries to the extremities.  Patient with laceration to the crown of the head as above.  PERRL, EOMI, no hemotympanum or epistaxis.  GCS of 15, neurologically intact.  Alert and oriented x4.  Amount and/or Complexity of Data Reviewed Labs: ordered.    Details: CBC without leukocytosis or anemia, CMP with mild hypokalemia of 3.3 and creatinine of 1.1 at patient's baseline.,  INR of 1.8 on Xarelto. Radiology: ordered.    Details: CT head negative for acute cranial abnormality visualized by this provider.  Risk OTC drugs. Prescription drug management.   Expediency of patient's workup complicated by multiple trauma patients simultaneously as well as EPIC downtime. Documented order times do not reflect accurately on timeliness of trauma orders, which were placed initially on paper order form per downtime protocol.   Wound repaired as above. Patient ambulatory in the ED. No further workup warranted in the ED at this time. Clinical concern for emergent underlying injury or diagnosis that would warrant further ED workup or inpatient management is exceedingly low.   Saroya and her son  voiced understanding of her medical evaluation and treatment plan. Each of their questions answered to their expressed satisfaction.  Return precautions were given.  Patient is well-appearing, stable, and was discharged in good  condition.  This chart was dictated using voice recognition software, Dragon. Despite the best efforts of this provider to proofread and correct errors, errors may still occur which can change documentation meaning.   Final Clinical Impression(s) / ED Diagnoses Final diagnoses:  Fall, initial encounter  Laceration of scalp with foreign body, initial encounter    Rx / DC Orders ED Discharge Orders     None         Aura Dials 09/01/22 0525    Fatima Blank, MD 09/01/22 1909

## 2022-09-02 LAB — I-STAT CHEM 8, ED
BUN: 43 mg/dL — ABNORMAL HIGH (ref 8–23)
Calcium, Ion: 1.03 mmol/L — ABNORMAL LOW (ref 1.15–1.40)
Chloride: 103 mmol/L (ref 98–111)
Creatinine, Ser: 1.1 mg/dL — ABNORMAL HIGH (ref 0.44–1.00)
Glucose, Bld: 96 mg/dL (ref 70–99)
HCT: 37 % (ref 36.0–46.0)
Hemoglobin: 12.6 g/dL (ref 12.0–15.0)
Potassium: 3.3 mmol/L — ABNORMAL LOW (ref 3.5–5.1)
Sodium: 138 mmol/L (ref 135–145)
TCO2: 26 mmol/L (ref 22–32)

## 2022-09-05 DIAGNOSIS — R634 Abnormal weight loss: Secondary | ICD-10-CM | POA: Diagnosis not present

## 2022-09-05 DIAGNOSIS — F4321 Adjustment disorder with depressed mood: Secondary | ICD-10-CM | POA: Diagnosis not present

## 2022-09-05 DIAGNOSIS — R110 Nausea: Secondary | ICD-10-CM | POA: Diagnosis not present

## 2022-09-12 DIAGNOSIS — Z4802 Encounter for removal of sutures: Secondary | ICD-10-CM | POA: Diagnosis not present

## 2022-10-05 ENCOUNTER — Other Ambulatory Visit: Payer: Self-pay | Admitting: Internal Medicine

## 2022-10-05 DIAGNOSIS — I480 Paroxysmal atrial fibrillation: Secondary | ICD-10-CM

## 2022-10-07 NOTE — Telephone Encounter (Signed)
Xarelto '15mg'$  refill request received. Pt is 88 years old, weight-52.2kg, Crea-1.18 on 09/01/2022, last seen by Dr Caryl Comes on 05/13/2022, Diagnosis-Afib, CrCl-25.59 mL/min; Dose is appropriate based on dosing criteria. Will send in refill to requested pharmacy.

## 2022-10-16 ENCOUNTER — Ambulatory Visit (INDEPENDENT_AMBULATORY_CARE_PROVIDER_SITE_OTHER): Payer: PPO

## 2022-10-16 DIAGNOSIS — I442 Atrioventricular block, complete: Secondary | ICD-10-CM | POA: Diagnosis not present

## 2022-10-16 LAB — CUP PACEART REMOTE DEVICE CHECK
Battery Remaining Longevity: 62 mo
Battery Remaining Percentage: 51 %
Battery Voltage: 2.99 V
Brady Statistic AP VP Percent: 5.5 %
Brady Statistic AP VS Percent: 8.8 %
Brady Statistic AS VP Percent: 33 %
Brady Statistic AS VS Percent: 53 %
Brady Statistic RA Percent Paced: 14 %
Brady Statistic RV Percent Paced: 39 %
Date Time Interrogation Session: 20231220055009
Implantable Lead Connection Status: 753985
Implantable Lead Connection Status: 753985
Implantable Lead Implant Date: 20181130
Implantable Lead Implant Date: 20181130
Implantable Lead Location: 753859
Implantable Lead Location: 753860
Implantable Pulse Generator Implant Date: 20181130
Lead Channel Impedance Value: 330 Ohm
Lead Channel Impedance Value: 480 Ohm
Lead Channel Pacing Threshold Amplitude: 0.75 V
Lead Channel Pacing Threshold Amplitude: 1 V
Lead Channel Pacing Threshold Pulse Width: 0.5 ms
Lead Channel Pacing Threshold Pulse Width: 0.5 ms
Lead Channel Sensing Intrinsic Amplitude: 3.6 mV
Lead Channel Sensing Intrinsic Amplitude: 8.7 mV
Lead Channel Setting Pacing Amplitude: 1 V
Lead Channel Setting Pacing Amplitude: 2 V
Lead Channel Setting Pacing Pulse Width: 0.5 ms
Lead Channel Setting Sensing Sensitivity: 2 mV
Pulse Gen Model: 2272
Pulse Gen Serial Number: 8969488

## 2022-11-11 NOTE — Progress Notes (Signed)
Remote pacemaker transmission.   

## 2022-11-18 DIAGNOSIS — H6123 Impacted cerumen, bilateral: Secondary | ICD-10-CM | POA: Diagnosis not present

## 2022-11-18 DIAGNOSIS — F5101 Primary insomnia: Secondary | ICD-10-CM | POA: Diagnosis not present

## 2022-11-18 DIAGNOSIS — H9193 Unspecified hearing loss, bilateral: Secondary | ICD-10-CM | POA: Diagnosis not present

## 2022-12-09 ENCOUNTER — Telehealth: Payer: Self-pay | Admitting: Internal Medicine

## 2022-12-09 NOTE — Telephone Encounter (Signed)
Patient want to discuss being off her blood thinner for a procedure. Please advise

## 2022-12-10 NOTE — Telephone Encounter (Signed)
Spoke with pt who reports she will be having dental implants and is asking about holding Eliquis for procedure.  She does not yet have an oral surgeon for the procedure.  Pt advised once procedure is scheduled the dental office should submit a clearance request for the procedure and further direction regarding Eliquis.  Pt verbalizes understanding and thanked Therapist, sports for the callback.

## 2023-01-15 ENCOUNTER — Ambulatory Visit (INDEPENDENT_AMBULATORY_CARE_PROVIDER_SITE_OTHER): Payer: PPO

## 2023-01-15 DIAGNOSIS — I442 Atrioventricular block, complete: Secondary | ICD-10-CM | POA: Diagnosis not present

## 2023-01-16 LAB — CUP PACEART REMOTE DEVICE CHECK
Battery Remaining Longevity: 58 mo
Battery Remaining Percentage: 48 %
Battery Voltage: 2.99 V
Brady Statistic AP VP Percent: 5.4 %
Brady Statistic AP VS Percent: 5.6 %
Brady Statistic AS VP Percent: 56 %
Brady Statistic AS VS Percent: 33 %
Brady Statistic RA Percent Paced: 11 %
Brady Statistic RV Percent Paced: 61 %
Date Time Interrogation Session: 20240320020032
Implantable Lead Connection Status: 753985
Implantable Lead Connection Status: 753985
Implantable Lead Implant Date: 20181130
Implantable Lead Implant Date: 20181130
Implantable Lead Location: 753859
Implantable Lead Location: 753860
Implantable Pulse Generator Implant Date: 20181130
Lead Channel Impedance Value: 380 Ohm
Lead Channel Impedance Value: 480 Ohm
Lead Channel Pacing Threshold Amplitude: 0.875 V
Lead Channel Pacing Threshold Amplitude: 1 V
Lead Channel Pacing Threshold Pulse Width: 0.5 ms
Lead Channel Pacing Threshold Pulse Width: 0.5 ms
Lead Channel Sensing Intrinsic Amplitude: 2.6 mV
Lead Channel Sensing Intrinsic Amplitude: 8.7 mV
Lead Channel Setting Pacing Amplitude: 1.125
Lead Channel Setting Pacing Amplitude: 2 V
Lead Channel Setting Pacing Pulse Width: 0.5 ms
Lead Channel Setting Sensing Sensitivity: 2 mV
Pulse Gen Model: 2272
Pulse Gen Serial Number: 8969488

## 2023-02-20 NOTE — Progress Notes (Signed)
Remote pacemaker transmission.   

## 2023-02-21 ENCOUNTER — Other Ambulatory Visit: Payer: Self-pay | Admitting: Internal Medicine

## 2023-03-31 ENCOUNTER — Other Ambulatory Visit: Payer: Self-pay | Admitting: Internal Medicine

## 2023-03-31 DIAGNOSIS — I480 Paroxysmal atrial fibrillation: Secondary | ICD-10-CM

## 2023-03-31 NOTE — Telephone Encounter (Signed)
Prescription refill request for Xarelto received.  Indication:afib Last office visit:7/23 Weight:52.2  kg Age:89 Scr:1.18  11/23 CrCl:25.07  ml/min  Prescription refilled

## 2023-04-16 ENCOUNTER — Ambulatory Visit (INDEPENDENT_AMBULATORY_CARE_PROVIDER_SITE_OTHER): Payer: PPO

## 2023-04-16 DIAGNOSIS — I442 Atrioventricular block, complete: Secondary | ICD-10-CM | POA: Diagnosis not present

## 2023-04-16 LAB — CUP PACEART REMOTE DEVICE CHECK
Battery Remaining Longevity: 54 mo
Battery Remaining Percentage: 46 %
Battery Voltage: 2.98 V
Brady Statistic AP VP Percent: 5.2 %
Brady Statistic AP VS Percent: 4.1 %
Brady Statistic AS VP Percent: 66 %
Brady Statistic AS VS Percent: 25 %
Brady Statistic RA Percent Paced: 8.7 %
Brady Statistic RV Percent Paced: 71 %
Date Time Interrogation Session: 20240619020016
Implantable Lead Connection Status: 753985
Implantable Lead Connection Status: 753985
Implantable Lead Implant Date: 20181130
Implantable Lead Implant Date: 20181130
Implantable Lead Location: 753859
Implantable Lead Location: 753860
Implantable Pulse Generator Implant Date: 20181130
Lead Channel Impedance Value: 330 Ohm
Lead Channel Impedance Value: 460 Ohm
Lead Channel Pacing Threshold Amplitude: 0.875 V
Lead Channel Pacing Threshold Amplitude: 1 V
Lead Channel Pacing Threshold Pulse Width: 0.5 ms
Lead Channel Pacing Threshold Pulse Width: 0.5 ms
Lead Channel Sensing Intrinsic Amplitude: 2.9 mV
Lead Channel Sensing Intrinsic Amplitude: 3 mV
Lead Channel Setting Pacing Amplitude: 1.125
Lead Channel Setting Pacing Amplitude: 2 V
Lead Channel Setting Pacing Pulse Width: 0.5 ms
Lead Channel Setting Sensing Sensitivity: 2 mV
Pulse Gen Model: 2272
Pulse Gen Serial Number: 8969488

## 2023-05-28 ENCOUNTER — Other Ambulatory Visit: Payer: Self-pay | Admitting: Internal Medicine

## 2023-06-10 ENCOUNTER — Other Ambulatory Visit: Payer: Self-pay | Admitting: Internal Medicine

## 2023-06-27 ENCOUNTER — Encounter: Payer: PPO | Admitting: Physician Assistant

## 2023-07-16 ENCOUNTER — Ambulatory Visit (INDEPENDENT_AMBULATORY_CARE_PROVIDER_SITE_OTHER): Payer: PPO

## 2023-07-16 DIAGNOSIS — I442 Atrioventricular block, complete: Secondary | ICD-10-CM | POA: Diagnosis not present

## 2023-07-16 LAB — CUP PACEART REMOTE DEVICE CHECK
Battery Remaining Longevity: 52 mo
Battery Remaining Percentage: 43 %
Battery Voltage: 2.98 V
Brady Statistic AP VP Percent: 5.2 %
Brady Statistic AP VS Percent: 3.2 %
Brady Statistic AS VP Percent: 71 %
Brady Statistic AS VS Percent: 20 %
Brady Statistic RA Percent Paced: 7.9 %
Brady Statistic RV Percent Paced: 77 %
Date Time Interrogation Session: 20240918070020
Implantable Lead Connection Status: 753985
Implantable Lead Connection Status: 753985
Implantable Lead Implant Date: 20181130
Implantable Lead Implant Date: 20181130
Implantable Lead Location: 753859
Implantable Lead Location: 753860
Implantable Pulse Generator Implant Date: 20181130
Lead Channel Impedance Value: 330 Ohm
Lead Channel Impedance Value: 510 Ohm
Lead Channel Pacing Threshold Amplitude: 0.75 V
Lead Channel Pacing Threshold Amplitude: 1 V
Lead Channel Pacing Threshold Pulse Width: 0.5 ms
Lead Channel Pacing Threshold Pulse Width: 0.5 ms
Lead Channel Sensing Intrinsic Amplitude: 3 mV
Lead Channel Sensing Intrinsic Amplitude: 3.1 mV
Lead Channel Setting Pacing Amplitude: 1 V
Lead Channel Setting Pacing Amplitude: 2 V
Lead Channel Setting Pacing Pulse Width: 0.5 ms
Lead Channel Setting Sensing Sensitivity: 2 mV
Pulse Gen Model: 2272
Pulse Gen Serial Number: 8969488

## 2023-07-19 NOTE — Progress Notes (Deleted)
Cardiology Office Note Date:  07/19/2023  Patient ID:  Yvonne, [name] 1928-11-02, MRN [id number] PCP:  Renford Dills, MD  Cardiologist/Electrophysiologist: Dr. Graciela Husbands   Chief Complaint: *** annual visit  History of Present Illness: Yvonne [name] is a 89 y.o. female with history of LBBB, GERD, CHB w/PPM, AFib, NICM, chronic CHF (systolic), SVT  She saw Dr. Graciela Husbands 05/13/22, living at [facility], wasn't going out, mentioned issues with a daughter. Maintained on amiodarone low dose Device reprogrammed to VIP noting AV conduction  ER visit 09/01/22, fall, son reports multiple falls per week, had trauma to top of her head requiring sutures,CT head negative  *** falls? OAC? Shared decision. *** burden *** xarelto, dose, bleeding, labs *** amio labs *** volume *** activity   Device information SJM dual chamber PPM implanted 09/26/2017  AAD Amiodarone started 2017   Past Medical History:  Diagnosis Date   Anxiety state, unspecified    Arthritis    Cancer (HCC)    basil cell carcinomas removed   Cataract    Complete heart block (HCC)    a. s/p St. Jude PPM 09/26/17.   Esophageal reflux    Hemorrhage of gastrointestinal tract, unspecified    a. occurred on Aleve   History of peptic ulcer    LBBB (left bundle branch block)    Left ventricular systolic dysfunction    hx of mild   Mitral valve insufficiency and aortic valve insufficiency    a. Remote echo 2010: mild focal basal hypertrophy of septum, EF 50-55%, mid-distal inferoseptal myocardium, grade 1 DD, LBBB, mild AI/MR, PASP mildly increased.   PAF (paroxysmal atrial fibrillation) (HCC)    Paroxysmal supraventricular tachycardia (HCC)    Wears glasses     Past Surgical History:  Procedure Laterality Date   ABDOMINAL HYSTERECTOMY     APPENDECTOMY     BREAST SURGERY     biopsy   CATARACT EXTRACTION Left    ESOPHAGOGASTRODUODENOSCOPY     ESOPHAGOGASTRODUODENOSCOPY (EGD) WITH PROPOFOL N/A 10/10/2015    Procedure: ESOPHAGOGASTRODUODENOSCOPY (EGD) WITH PROPOFOL;  Surgeon: Charolett Bumpers, MD;  Location: [facility];  Service: Endoscopy;  Laterality: N/A;   FOOT SURGERY Left    hammer toe and bunion   KNEE ARTHROSCOPY  2009   left   OPEN REDUCTION INTERNAL FIXATION (ORIF) METACARPAL Left 11/05/2013   Procedure: CLOSED REDUCTION/PINNING VS OPEN REDUCTION INTERNAL FIXATION (ORIF) LEFT SMALL  METACARPAL FRACTURE ;  Surgeon: Wyn Forster., MD;  Location: [facility];  Service: Orthopedics;  Laterality: Left;   ORIF HUMERUS FRACTURE Left 04/10/2015   Procedure: OPEN REDUCTION INTERNAL FIXATION (ORIF) PROXIMAL HUMERUS FRACTURE;  Surgeon: Eldred Manges, MD;  Location: [facility];  Service: Orthopedics;  Laterality: Left;   PACEMAKER IMPLANT N/A 09/26/2017   Procedure: PACEMAKER IMPLANT;  Surgeon: Regan Lemming, MD;  [facility];  Service: Cardiovascular;  Laterality: N/A;   TONSILLECTOMY     TUBAL LIGATION      Current Outpatient Medications  Medication Sig Dispense Refill   acetaminophen (TYLENOL) 500 MG tablet Take 500-1,000 mg by mouth every 6 (six) hours as needed (for pain).     amiodarone (PACERONE) 200 MG tablet TAKE ONE-HALF TABLET DAILY 45 tablet 0   buPROPion (WELLBUTRIN XL) 300 MG 24 hr tablet Take 1 tablet by mouth daily.     carvedilol (COREG) 12.5 MG tablet TAKE ONE TABLET BY MOUTH TWICE DAILY 180 tablet 0   clorazepate (TRANXENE) 7.5  MG tablet Take 7.5 mg by mouth 3 (three) times daily as needed for anxiety.      estradiol (ESTRACE) 0.5 MG tablet Take 0.5 mg by mouth daily.     fluticasone (FLONASE) 50 MCG/ACT nasal spray Place 2 sprays into both nostrils daily as needed for allergies.   5   fluticasone (FLONASE) 50 MCG/ACT nasal spray Place 2 sprays into both nostrils daily. 2 sprays each nostril at night 16 g 6   furosemide (LASIX) 20 MG tablet Take 2 tablets (40 mg total) by mouth daily. 180 tablet 0   ondansetron (ZOFRAN) 8 MG tablet Take 8 mg  by mouth every 8 (eight) hours as needed for nausea or vomiting.     potassium chloride (K-DUR) 10 MEQ tablet Take 10 mEq by mouth 2 (two) times daily.     sodium chloride (OCEAN) 0.65 % SOLN nasal spray Place 2 sprays into both nostrils daily as needed for congestion.     XARELTO 15 MG TABS tablet TAKE ONE TABLET EVERY DAY with SUPPER 90 tablet 1   No current facility-administered medications for this visit.    Allergies:   Aleve [naproxen sodium], Ambien [zolpidem], Flexeril [cyclobenzaprine], Adhesive [tape], and Aspirin   Social History:  The patient  reports that she has never smoked. She has never used smokeless tobacco. She reports that she does not drink alcohol and does not use drugs.   Family History:  The patient's family history includes Heart failure in her father; Hypertension in her mother; Stroke in her sister.  ROS:  Please see the history of present illness.    All other systems are reviewed and otherwise negative.   PHYSICAL EXAM:  VS:  There were no vitals taken for this visit. BMI: There is no height or weight on file to calculate BMI. Well nourished, well developed, in no acute distress HEENT: normocephalic, atraumatic Neck: no JVD, carotid bruits or masses Cardiac: *** RRR; no significant murmurs, no rubs, or gallops Lungs: *** CTA b/l, no wheezing, rhonchi or rales Abd: soft, nontender MS: no deformity, age appropriate atrophy Ext: *** no edema Skin: warm and dry, no rash Neuro:  No gross deficits appreciated Psych: euthymic mood, full affect  *** PPM site is stable, no tethering or discomfort   EKG:  Done today and reviewed by myself shows  ***  Device interrogation done today and reviewed by myself:  *** Battery and lead measurements are good ***   09/03/2016: TTE Study Conclusions  - Left ventricle: The cavity size was mildly dilated. Systolic    function was mildly to moderately reduced. The estimated ejection    fraction was in the range of  40% to 45%. Diffuse hypokinesis.    There was an increased relative contribution of atrial    contraction to ventricular filling. Doppler parameters are    consistent with abnormal left ventricular relaxation (grade 1    diastolic dysfunction).  - Ventricular septum: Septal motion showed moderate paradox. These    changes are consistent with intraventricular conduction delay.  - Aortic valve: There was mild regurgitation.  - Mitral valve: Calcified annulus. There was mild to moderate    regurgitation.  - Atrial septum: There was increased thickness of the septum,    consistent with lipomatous hypertrophy.  - Tricuspid valve: There was mild regurgitation.  - Pulmonary arteries: PA peak pressure: 37 mm Hg (S).     Recent Labs: 09/01/2022: ALT 15; BUN 43; Creatinine, Ser 1.18; Hemoglobin 12.0; Platelets 249; Potassium  3.3; Sodium 138  No results found for requested labs within last 365 days.   CrCl cannot be calculated (Patient's most recent lab result is older than the maximum 21 days allowed.).   Wt Readings from Last 3 Encounters:  09/01/22 115 lb (52.2 kg)  05/13/22 117 lb 12.8 oz (53.4 kg)  06/22/21 125 lb (56.7 kg)     Other studies reviewed: Additional studies/records reviewed today include: summarized above  ASSESSMENT AND PLAN:  1. PPM     *** Intact function     *** no programming changes made  2. Paroxysmal Afib     CHA2DS2Vasc is *** 4, on *** Xarelto,  appropriately dosed by last labs     *** % burden     *** On low dose amio     *** Labs today  3. HTN     ***Looks good     *** no changes  4. VHD     ***Mild-mod MR by last echo     *** No symptoms to suggest clinical change   5. Falls     ***   Disposition: ***  Current medicines are reviewed at length with the patient today.  The patient did not have any concerns regarding medicines.  Norma Fredrickson, PA-C 07/19/2023 10:26 AM     [facility] [address] [place] [address] [phone] (office)  [phone] (fax)

## 2023-07-21 ENCOUNTER — Ambulatory Visit: Payer: PPO | Attending: Physician Assistant | Admitting: Physician Assistant

## 2023-07-22 NOTE — Progress Notes (Signed)
Remote pacemaker transmission.   

## 2023-07-31 NOTE — Progress Notes (Signed)
Remote pacemaker transmission.   

## 2023-08-04 ENCOUNTER — Telehealth: Payer: Self-pay | Admitting: Internal Medicine

## 2023-08-04 NOTE — Telephone Encounter (Signed)
Spoke with pt who complains of increased swelling of her LLE  x 1 day.  Pt denies current SOB or CP.  She states she does not see any redness and does not feel her leg is hot to the touch.  She does state it is painful but she had knee surgery several years ago so the pain is not new.  She does not check her blood pressure regularly nor does she follow a low sodium diet..  She has recently been diagnosed with anemia by her PCP and started on OTC iron supplement.  Pt encouraged to elevate left leg when sitting and monitor/reduce her sodium intake.  She is going to ask the facility where she resides to check her blood pressure this week.  She is scheduled to see Francis Dowse, PA-C in November. Reviewed ED precautions and will discuss with Dr Graciela Husbands.  Pt verbalizes understanding and agrees with current plan.

## 2023-08-04 NOTE — Telephone Encounter (Signed)
Pt c/o swelling: STAT is pt has developed SOB within 24 hours  How much weight have you gained and in what time span? Not sure  If swelling, where is the swelling located? Left foot and knee  Are you currently taking a fluid pill? Yes  Are you currently SOB? No  Do you have a log of your daily weights (if so, list)? No  Have you gained 3 pounds in a day or 5 pounds in a week? Pt not sure   Have you traveled recently? No

## 2023-08-05 NOTE — Telephone Encounter (Signed)
Attempted phone call to pt and left voicemail to contact RN at [phone].

## 2023-08-06 NOTE — Telephone Encounter (Signed)
Spoke with pt who reports edema of left leg has greatly improved.  Pt states she will plan to keep appointment scheduled for 09/10/23 with Francis Dowse, PA-C  pt thanked RN for the call.

## 2023-08-29 ENCOUNTER — Other Ambulatory Visit: Payer: Self-pay | Admitting: Internal Medicine

## 2023-09-05 ENCOUNTER — Encounter: Payer: PPO | Admitting: Physician Assistant

## 2023-09-08 ENCOUNTER — Other Ambulatory Visit: Payer: Self-pay | Admitting: Internal Medicine

## 2023-09-08 NOTE — Progress Notes (Addendum)
Cardiology Office Note Date:  09/12/2023  Patient ID:  Yvonne [name], Yvonne [name] 24-Apr-1929, MRN [id number] PCP:  Thana Ates, MD  Cardiologist/Electrophysiologist: Dr. Graciela Husbands   Chief Complaint: over due visit  History of Present Illness: Yvonne [name] is a 89 y.o. female with history of LBBB, GERD, CHB w/PPM, AFib, NICM,   Last remote device check 07/16/2023 per Dr. Elberta Fortis which showed normal function, battery and leads stable. Remote notable for AF burden <1%, 15 AMS with longest duration of 4 minutes, overall controlled rates.   Seen today to be seen for Dr. Graciela Husbands, last seen by him 05/13/22, sedentary, remains in her apartment in [place], was eating all meals in her room.    She lost her husband several years ago and had lost a lot of weight (got down to 115lbs) but "found [employer] ice cream" and she has gained weight back. She is unaware of her AF when it occurs.  No issues with her blood thinner > does have hemorrhoids and will occasionally see bright red blood but no consistent bleeding. She recently had a broken left front tooth "that fell out" and it bled acutely but none since. She is inconsistent in her memory regarding her falls and is unable to tell me the last time she fell. She recalls an event where she fell and hit her head and had a scalp laceration but she thinks this was years ago.      Device: SJM dual chamber PPM implanted 09/26/2017 for intermittent CHB  AAD: Amiodarone started 2017   Studies: ECHO 03/2016 > LVEF 25-30% ECHO 08/2016 > LVEF 45%, G1DD  Past Medical History:  Diagnosis Date   Anxiety state, unspecified    Arthritis    Cancer (HCC)    basil cell carcinomas removed   Cataract    Complete heart block (HCC)    a. s/p St. Jude PPM 09/26/17.   Esophageal reflux    Hemorrhage of gastrointestinal tract, unspecified    a. occurred on Aleve   History of peptic ulcer    LBBB (left bundle branch block)    Left ventricular systolic dysfunction     hx of mild   Mitral valve insufficiency and aortic valve insufficiency    a. Remote echo 2010: mild focal basal hypertrophy of septum, EF 50-55%, mid-distal inferoseptal myocardium, grade 1 DD, LBBB, mild AI/MR, PASP mildly increased.   PAF (paroxysmal atrial fibrillation) (HCC)    Paroxysmal supraventricular tachycardia (HCC)    Wears glasses     Past Surgical History:  Procedure Laterality Date   ABDOMINAL HYSTERECTOMY     APPENDECTOMY     BREAST SURGERY     biopsy   CATARACT EXTRACTION Left    ESOPHAGOGASTRODUODENOSCOPY     ESOPHAGOGASTRODUODENOSCOPY (EGD) WITH PROPOFOL N/A 10/10/2015   Procedure: ESOPHAGOGASTRODUODENOSCOPY (EGD) WITH PROPOFOL;  Surgeon: Charolett Bumpers, MD;  Location: [facility];  Service: Endoscopy;  Laterality: N/A;   FOOT SURGERY Left    hammer toe and bunion   KNEE ARTHROSCOPY  2009   left   OPEN REDUCTION INTERNAL FIXATION (ORIF) METACARPAL Left 11/05/2013   Procedure: CLOSED REDUCTION/PINNING VS OPEN REDUCTION INTERNAL FIXATION (ORIF) LEFT SMALL  METACARPAL FRACTURE ;  Surgeon: Wyn Forster., MD;  Location: [facility];  Service: Orthopedics;  Laterality: Left;   ORIF HUMERUS FRACTURE Left 04/10/2015   Procedure: OPEN REDUCTION INTERNAL FIXATION (ORIF) PROXIMAL HUMERUS FRACTURE;  Surgeon: Eldred Manges, MD;  Location[facility];  Service: Orthopedics;  Laterality: Left;   PACEMAKER IMPLANT N/A 09/26/2017   Procedure: PACEMAKER IMPLANT;  Surgeon: Regan Lemming, MD;  [facility];  Service: Cardiovascular;  Laterality: N/A;   TONSILLECTOMY     TUBAL LIGATION      Current Outpatient Medications  Medication Sig Dispense Refill   acetaminophen (TYLENOL) 500 MG tablet Take 500-1,000 mg by mouth every 6 (six) hours as needed (for pain).     ALPRAZolam (XANAX) 0.25 MG tablet Take 0.25 mg by mouth 2 (two) times daily as needed.     amiodarone (PACERONE) 200 MG tablet TAKE ONE-HALF TABLET DAILY 45 tablet 0   buPROPion  (WELLBUTRIN XL) 300 MG 24 hr tablet Take 1 tablet by mouth daily.     carvedilol (COREG) 12.5 MG tablet Take 1 tablet (12.5 mg total) by mouth 2 (two) times daily. 60 tablet 0   clorazepate (TRANXENE) 7.5 MG tablet Take 7.5 mg by mouth 3 (three) times daily as needed for anxiety.      estradiol (ESTRACE) 0.5 MG tablet Take 0.5 mg by mouth daily.     fluticasone (FLONASE) 50 MCG/ACT nasal spray Place 2 sprays into both nostrils daily as needed for allergies.   5   fluticasone (FLONASE) 50 MCG/ACT nasal spray Place 2 sprays into both nostrils daily. 2 sprays each nostril at night 16 g 6   furosemide (LASIX) 20 MG tablet TAKE TWO TABLETS BY MOUTH DAILY 30 tablet 0   mirtazapine (REMERON) 7.5 MG tablet Take 7.5 mg by mouth at bedtime.     ondansetron (ZOFRAN) 8 MG tablet Take 8 mg by mouth every 8 (eight) hours as needed for nausea or vomiting.     pantoprazole (PROTONIX) 40 MG tablet Take 40 mg by mouth every morning.     potassium chloride (K-DUR) 10 MEQ tablet Take 10 mEq by mouth 2 (two) times daily.     sodium chloride (OCEAN) 0.65 % SOLN nasal spray Place 2 sprays into both nostrils daily as needed for congestion.     XARELTO 15 MG TABS tablet TAKE ONE TABLET EVERY DAY with SUPPER 90 tablet 1   No current facility-administered medications for this visit.    Allergies:   Aleve [naproxen sodium], Ambien [zolpidem], Flexeril [cyclobenzaprine], Adhesive [tape], and Aspirin   Social History:  The patient  reports that she has never smoked. She has never used smokeless tobacco. She reports that she does not drink alcohol and does not use drugs.   Family History:  The patient's family history includes Heart failure in her father; Hypertension in her mother; Stroke in her sister.  ROS:  Please see the history of present illness.    All other systems are reviewed and otherwise negative.   PHYSICAL EXAM:  VS:  BP 126/70   Pulse 72   Ht 5\' 7"  (1.702 m)   Wt 143 lb 12.8 oz (65.2 kg) Comment:  weight recheck without light jacket 143.2lb  SpO2 94%   BMI 22.52 kg/m  BMI: Body mass index is 22.52 kg/m.  General: elderly adult female sitting in chair in NAD HEENT: MM pink/moist, broken / missing tooth front left Neuro: Awake, alert, speech clear, non-focal, ? Baseline memory issues  CV: s1s2 RRR, no m/r/g PULM: non-labored at rest, lungs clear bilaterally  GI: soft, bsx4 active  Extremities: warm/dry, no edema  Skin: no rashes or lesions  PPM site is stable, no tethering or discomfort   EKG:    09/10/23 > AS,  VP rhythm  72 bpm   Device interrogation: Intermittent CHB and VIP turned on Battery and lead measurements are good P: ASVP B: 3.8-4.5 years   Impedances, sensing and thresholds wnl.  No R waves at VVI 40 today. See Pace Art    Recent Labs: 09/10/2023: ALT 19; BUN 37; Creatinine, Ser 1.30; Hemoglobin 10.6; Platelets 275; Potassium 4.5; Sodium 141; TSH 1.780  No results found for requested labs within last 365 days.   Estimated Creatinine Clearance: 26.9 mL/min (A) (by C-G formula based on SCr of 1.3 mg/dL (H)).   Wt Readings from Last 3 Encounters:  09/10/23 143 lb 12.8 oz (65.2 kg)  09/01/22 115 lb (52.2 kg)  05/13/22 117 lb 12.8 oz (53.4 kg)     Other studies reviewed: Additional studies/records reviewed today include: summarized above  ASSESSMENT AND PLAN:  Intermittent CHB s/p PPM  -intact PPM function -see PaceArt for details  -no programming changes made, appears Dr. Elberta Fortis follows device  Paroxysmal AF  CHA2DS2-VASc 4 -continue OAC as below  -<1% burden on device  -continue low dose amiodarone  -follow up amio labs to day > CMP, TSH, free T4   Secondary Hypercoagulable State  -continue Xarelto 15mg , dose reviewed as below  -Cr Cl based on last labs from 08/2022 > 54ml/min.  -update OAC labs today. If repeat CrCl <15, will have to revisit OAC options   Hypertension  -well controlled on current regimen   VHD Mild-mod MR by last  Echo -per Cardiology   Falls -We discussed at length falls and her safety and urged her to use her walker at all times, slow down and pay close attention to what she is doing and her environment.   Follow up with Dr. Graciela Husbands in 6 months   Signed, Canary Brim, MSN, APRN, NP-C, AGACNP-BC [facility] - Electrophysiology  09/12/2023, 5:15 PM

## 2023-09-10 ENCOUNTER — Ambulatory Visit: Payer: PPO | Attending: Physician Assistant | Admitting: Pulmonary Disease

## 2023-09-10 ENCOUNTER — Encounter: Payer: Self-pay | Admitting: Pulmonary Disease

## 2023-09-10 VITALS — BP 126/70 | HR 72 | Ht 67.0 in | Wt 143.8 lb

## 2023-09-10 DIAGNOSIS — R002 Palpitations: Secondary | ICD-10-CM | POA: Diagnosis not present

## 2023-09-10 DIAGNOSIS — I4891 Unspecified atrial fibrillation: Secondary | ICD-10-CM | POA: Diagnosis not present

## 2023-09-10 DIAGNOSIS — I080 Rheumatic disorders of both mitral and aortic valves: Secondary | ICD-10-CM

## 2023-09-10 DIAGNOSIS — I471 Supraventricular tachycardia, unspecified: Secondary | ICD-10-CM | POA: Diagnosis not present

## 2023-09-10 DIAGNOSIS — I442 Atrioventricular block, complete: Secondary | ICD-10-CM

## 2023-09-10 LAB — CUP PACEART INCLINIC DEVICE CHECK
Battery Remaining Longevity: 54 mo
Battery Voltage: 2.98 V
Brady Statistic RA Percent Paced: 7.1 %
Brady Statistic RV Percent Paced: 79 %
Date Time Interrogation Session: 20241113134149
Implantable Lead Connection Status: 753985
Implantable Lead Connection Status: 753985
Implantable Lead Implant Date: 20181130
Implantable Lead Implant Date: 20181130
Implantable Lead Location: 753859
Implantable Lead Location: 753860
Implantable Pulse Generator Implant Date: 20181130
Lead Channel Impedance Value: 350 Ohm
Lead Channel Impedance Value: 550 Ohm
Lead Channel Pacing Threshold Amplitude: 0.625 V
Lead Channel Pacing Threshold Amplitude: 0.75 V
Lead Channel Pacing Threshold Amplitude: 0.75 V
Lead Channel Pacing Threshold Pulse Width: 0.5 ms
Lead Channel Pacing Threshold Pulse Width: 0.5 ms
Lead Channel Pacing Threshold Pulse Width: 0.5 ms
Lead Channel Sensing Intrinsic Amplitude: 3.9 mV
Lead Channel Setting Pacing Amplitude: 0.875
Lead Channel Setting Pacing Amplitude: 2 V
Lead Channel Setting Pacing Pulse Width: 0.5 ms
Lead Channel Setting Sensing Sensitivity: 2 mV
Pulse Gen Model: 2272
Pulse Gen Serial Number: 8969488

## 2023-09-10 NOTE — Patient Instructions (Signed)
Medication Instructions:  Your physician recommends that you continue on your current medications as directed. Please refer to the Current Medication list given to you today.  *If you need a refill on your cardiac medications before your next appointment, please call your pharmacy*  Lab Work: CMP, CBC, TSH, FreeT4-TODAY If you have labs (blood work) drawn today and your tests are completely normal, you will receive your results only by: MyChart Message (if you have MyChart) OR A paper copy in the mail If you have any lab test that is abnormal or we need to change your treatment, we will call you to review the results.  Follow-Up: At [facility], you and your health needs are our priority.  As part of our continuing mission to provide you with exceptional heart care, we have created designated Provider Care Teams.  These Care Teams include your primary Cardiologist (physician) and Advanced Practice Providers (APPs -  Physician Assistants and Nurse Practitioners) who all work together to provide you with the care you need, when you need it.  We recommend signing up for the patient portal called "MyChart".  Sign up information is provided on this After Visit Summary.  MyChart is used to connect with patients for Virtual Visits (Telemedicine).  Patients are able to view lab/test results, encounter notes, upcoming appointments, etc.  Non-urgent messages can be sent to your provider as well.   To learn more about what you can do with MyChart, go to [email or web address].    Your next appointment:   6-8 month(s)  Provider:   Sherryl Manges, MD

## 2023-09-11 LAB — T4, FREE: Free T4: 1.58 ng/dL (ref 0.82–1.77)

## 2023-09-11 LAB — COMPREHENSIVE METABOLIC PANEL
ALT: 19 IU/L (ref 0–32)
AST: 23 IU/L (ref 0–40)
Albumin: 4.3 g/dL (ref 3.6–4.6)
Alkaline Phosphatase: 94 [IU]/L (ref 44–121)
BUN/Creatinine Ratio: 28 (ref 12–28)
BUN: 37 mg/dL — ABNORMAL HIGH (ref 10–36)
Bilirubin Total: 0.3 mg/dL (ref 0.0–1.2)
CO2: 21 mmol/L (ref 20–29)
Calcium: 9.5 mg/dL (ref 8.7–10.3)
Chloride: 101 mmol/L (ref 96–106)
Creatinine, Ser: 1.3 mg/dL — ABNORMAL HIGH (ref 0.57–1.00)
Globulin, Total: 2.5 g/dL (ref 1.5–4.5)
Glucose: 84 mg/dL (ref 70–99)
Potassium: 4.5 mmol/L (ref 3.5–5.2)
Sodium: 141 mmol/L (ref 134–144)
Total Protein: 6.8 g/dL (ref 6.0–8.5)
eGFR: 39 mL/min/{1.73_m2} — ABNORMAL LOW (ref >59)

## 2023-09-11 LAB — CBC
Hematocrit: 34.7 % (ref 34.0–46.6)
Hemoglobin: 10.6 g/dL — ABNORMAL LOW (ref 11.1–15.9)
MCH: 28.5 pg (ref 26.6–33.0)
MCHC: 30.5 g/dL — ABNORMAL LOW (ref 31.5–35.7)
MCV: 93 fL (ref 79–97)
Platelets: 275 10*3/uL (ref 150–450)
RBC: 3.72 x10E6/uL — ABNORMAL LOW (ref 3.77–5.28)
RDW: 15.5 % — ABNORMAL HIGH (ref 11.7–15.4)
WBC: 7.1 10*3/uL (ref 3.4–10.8)

## 2023-09-11 LAB — TSH: TSH: 1.78 u[IU]/mL (ref 0.450–4.500)

## 2023-09-15 ENCOUNTER — Other Ambulatory Visit: Payer: Self-pay | Admitting: Internal Medicine

## 2023-09-20 ENCOUNTER — Other Ambulatory Visit: Payer: Self-pay | Admitting: Internal Medicine

## 2023-09-20 DIAGNOSIS — I480 Paroxysmal atrial fibrillation: Secondary | ICD-10-CM

## 2023-09-22 NOTE — Telephone Encounter (Signed)
Prescription refill request for Xarelto received. Indication: AF Last office visit: 09/10/23  Janeece Riggers NP   Scr: 1.30 on 09/10/23  Epic Age: 89 Weight: 65.2kg CrCl: 28.42  Based on above findings Xarelto 15mg  daily is the appropriate dose.  Refill approved.

## 2023-09-25 ENCOUNTER — Other Ambulatory Visit: Payer: Self-pay

## 2023-09-25 ENCOUNTER — Emergency Department (HOSPITAL_COMMUNITY): Payer: PPO

## 2023-09-25 ENCOUNTER — Inpatient Hospital Stay (HOSPITAL_COMMUNITY)
Admission: EM | Admit: 2023-09-25 | Discharge: 2023-09-30 | DRG: 522 | Disposition: A | Discharge status: Skilled Nursing Facility | Payer: PPO | Source: Skilled Nursing Facility | Attending: Internal Medicine | Admitting: Internal Medicine

## 2023-09-25 DIAGNOSIS — S61411A Laceration without foreign body of right hand, initial encounter: Secondary | ICD-10-CM | POA: Diagnosis present

## 2023-09-25 DIAGNOSIS — I4891 Unspecified atrial fibrillation: Secondary | ICD-10-CM | POA: Diagnosis not present

## 2023-09-25 DIAGNOSIS — S72009A Fracture of unspecified part of neck of unspecified femur, initial encounter for closed fracture: Secondary | ICD-10-CM | POA: Diagnosis present

## 2023-09-25 DIAGNOSIS — S72011A Unspecified intracapsular fracture of right femur, initial encounter for closed fracture: Secondary | ICD-10-CM | POA: Diagnosis present

## 2023-09-25 DIAGNOSIS — Z950 Presence of cardiac pacemaker: Secondary | ICD-10-CM | POA: Diagnosis not present

## 2023-09-25 DIAGNOSIS — R451 Restlessness and agitation: Secondary | ICD-10-CM | POA: Diagnosis not present

## 2023-09-25 DIAGNOSIS — Z8249 Family history of ischemic heart disease and other diseases of the circulatory system: Secondary | ICD-10-CM | POA: Diagnosis not present

## 2023-09-25 DIAGNOSIS — S72001A Fracture of unspecified part of neck of right femur, initial encounter for closed fracture: Secondary | ICD-10-CM | POA: Diagnosis not present

## 2023-09-25 DIAGNOSIS — E876 Hypokalemia: Secondary | ICD-10-CM | POA: Diagnosis present

## 2023-09-25 DIAGNOSIS — F419 Anxiety disorder, unspecified: Secondary | ICD-10-CM | POA: Diagnosis present

## 2023-09-25 DIAGNOSIS — Y92129 Unspecified place in nursing home as the place of occurrence of the external cause: Secondary | ICD-10-CM

## 2023-09-25 DIAGNOSIS — R410 Disorientation, unspecified: Secondary | ICD-10-CM | POA: Diagnosis not present

## 2023-09-25 DIAGNOSIS — K219 Gastro-esophageal reflux disease without esophagitis: Secondary | ICD-10-CM | POA: Diagnosis present

## 2023-09-25 DIAGNOSIS — I110 Hypertensive heart disease with heart failure: Secondary | ICD-10-CM | POA: Diagnosis present

## 2023-09-25 DIAGNOSIS — Z8711 Personal history of peptic ulcer disease: Secondary | ICD-10-CM

## 2023-09-25 DIAGNOSIS — Z823 Family history of stroke: Secondary | ICD-10-CM | POA: Diagnosis not present

## 2023-09-25 DIAGNOSIS — I480 Paroxysmal atrial fibrillation: Secondary | ICD-10-CM | POA: Diagnosis present

## 2023-09-25 DIAGNOSIS — Z79899 Other long term (current) drug therapy: Secondary | ICD-10-CM

## 2023-09-25 DIAGNOSIS — Z886 Allergy status to analgesic agent status: Secondary | ICD-10-CM | POA: Diagnosis not present

## 2023-09-25 DIAGNOSIS — I5022 Chronic systolic (congestive) heart failure: Secondary | ICD-10-CM | POA: Diagnosis present

## 2023-09-25 DIAGNOSIS — Z888 Allergy status to other drugs, medicaments and biological substances status: Secondary | ICD-10-CM | POA: Diagnosis not present

## 2023-09-25 DIAGNOSIS — D649 Anemia, unspecified: Secondary | ICD-10-CM | POA: Diagnosis present

## 2023-09-25 DIAGNOSIS — I442 Atrioventricular block, complete: Secondary | ICD-10-CM | POA: Diagnosis present

## 2023-09-25 DIAGNOSIS — Z90710 Acquired absence of both cervix and uterus: Secondary | ICD-10-CM

## 2023-09-25 DIAGNOSIS — Z85828 Personal history of other malignant neoplasm of skin: Secondary | ICD-10-CM | POA: Diagnosis not present

## 2023-09-25 DIAGNOSIS — I5020 Unspecified systolic (congestive) heart failure: Secondary | ICD-10-CM | POA: Diagnosis not present

## 2023-09-25 DIAGNOSIS — W1830XA Fall on same level, unspecified, initial encounter: Secondary | ICD-10-CM | POA: Diagnosis present

## 2023-09-25 DIAGNOSIS — Z9842 Cataract extraction status, left eye: Secondary | ICD-10-CM

## 2023-09-25 DIAGNOSIS — Z7901 Long term (current) use of anticoagulants: Secondary | ICD-10-CM

## 2023-09-25 DIAGNOSIS — W19XXXA Unspecified fall, initial encounter: Secondary | ICD-10-CM

## 2023-09-25 DIAGNOSIS — R3915 Urgency of urination: Secondary | ICD-10-CM | POA: Diagnosis present

## 2023-09-25 DIAGNOSIS — Z91048 Other nonmedicinal substance allergy status: Secondary | ICD-10-CM

## 2023-09-25 DIAGNOSIS — Y92009 Unspecified place in unspecified non-institutional (private) residence as the place of occurrence of the external cause: Secondary | ICD-10-CM

## 2023-09-25 DIAGNOSIS — I10 Essential (primary) hypertension: Secondary | ICD-10-CM | POA: Diagnosis present

## 2023-09-25 LAB — CBC WITH DIFFERENTIAL/PLATELET
Abs Immature Granulocytes: 0.07 10*3/uL (ref 0.00–0.07)
Basophils Absolute: 0 10*3/uL (ref 0.0–0.1)
Basophils Relative: 1 %
Eosinophils Absolute: 0.2 10*3/uL (ref 0.0–0.5)
Eosinophils Relative: 2 %
HCT: 34.9 % — ABNORMAL LOW (ref 36.0–46.0)
Hemoglobin: 10.9 g/dL — ABNORMAL LOW (ref 12.0–15.0)
Immature Granulocytes: 1 %
Lymphocytes Relative: 16 %
Lymphs Abs: 1.3 10*3/uL (ref 0.7–4.0)
MCH: 29.3 pg (ref 26.0–34.0)
MCHC: 31.2 g/dL (ref 30.0–36.0)
MCV: 93.8 fL (ref 80.0–100.0)
Monocytes Absolute: 0.6 10*3/uL (ref 0.1–1.0)
Monocytes Relative: 8 %
Neutro Abs: 5.6 10*3/uL (ref 1.7–7.7)
Neutrophils Relative %: 72 %
Platelets: 271 10*3/uL (ref 150–400)
RBC: 3.72 MIL/uL — ABNORMAL LOW (ref 3.87–5.11)
RDW: 15.7 % — ABNORMAL HIGH (ref 11.5–15.5)
WBC: 7.7 10*3/uL (ref 4.0–10.5)
nRBC: 0 % (ref 0.0–0.2)

## 2023-09-25 LAB — PROTIME-INR
INR: 2 — ABNORMAL HIGH (ref 0.8–1.2)
Prothrombin Time: 22.6 s — ABNORMAL HIGH (ref 11.4–15.2)

## 2023-09-25 LAB — MAGNESIUM: Magnesium: 2.6 mg/dL — ABNORMAL HIGH (ref 1.7–2.4)

## 2023-09-25 LAB — COMPREHENSIVE METABOLIC PANEL
ALT: 20 U/L (ref 0–44)
AST: 26 U/L (ref 15–41)
Albumin: 3.7 g/dL (ref 3.5–5.0)
Alkaline Phosphatase: 78 U/L (ref 38–126)
Anion gap: 6 (ref 5–15)
BUN: 35 mg/dL — ABNORMAL HIGH (ref 8–23)
CO2: 23 mmol/L (ref 22–32)
Calcium: 8.9 mg/dL (ref 8.9–10.3)
Chloride: 110 mmol/L (ref 98–111)
Creatinine, Ser: 1.06 mg/dL — ABNORMAL HIGH (ref 0.44–1.00)
GFR, Estimated: 49 mL/min — ABNORMAL LOW (ref >60)
Glucose, Bld: 100 mg/dL — ABNORMAL HIGH (ref 70–99)
Potassium: 3.7 mmol/L (ref 3.5–5.1)
Sodium: 139 mmol/L (ref 135–145)
Total Bilirubin: 0.3 mg/dL (ref <1.2)
Total Protein: 6.6 g/dL (ref 6.5–8.1)

## 2023-09-25 LAB — TYPE AND SCREEN
ABO/RH(D): O POS
Antibody Screen: NEGATIVE

## 2023-09-25 LAB — ABO/RH: ABO/RH(D): O POS

## 2023-09-25 MED ORDER — FENTANYL CITRATE PF 50 MCG/ML IJ SOSY
50.0000 ug | PREFILLED_SYRINGE | Freq: Once | INTRAMUSCULAR | Status: AC
  Administered 2023-09-25: 50 ug via INTRAVENOUS
  Filled 2023-09-25: qty 1

## 2023-09-25 MED ORDER — MORPHINE SULFATE (PF) 4 MG/ML IV SOLN
4.0000 mg | Freq: Once | INTRAVENOUS | Status: AC
  Administered 2023-09-25: 4 mg via INTRAVENOUS
  Filled 2023-09-25: qty 1

## 2023-09-25 MED ORDER — ALPRAZOLAM 0.25 MG PO TABS
0.2500 mg | ORAL_TABLET | Freq: Two times a day (BID) | ORAL | Status: DC | PRN
  Administered 2023-09-25 – 2023-09-29 (×4): 0.25 mg via ORAL
  Filled 2023-09-25 (×4): qty 1

## 2023-09-25 MED ORDER — POLYVINYL ALCOHOL 1.4 % OP SOLN: 1.0000 [drp] | OPHTHALMIC | Status: DC | PRN

## 2023-09-25 MED ORDER — LIDOCAINE HCL (PF) 1 % IJ SOLN
5.0000 mL | Freq: Once | INTRAMUSCULAR | Status: AC
  Administered 2023-09-25: 5 mL via INTRADERMAL
  Filled 2023-09-25: qty 5

## 2023-09-25 MED ORDER — PROCHLORPERAZINE EDISYLATE 10 MG/2ML IJ SOLN: 5.0000 mg | INTRAMUSCULAR | Status: DC | PRN

## 2023-09-25 MED ORDER — SALINE SPRAY 0.65 % NA SOLN: 2.0000 | Freq: Every day | NASAL | Status: DC | PRN

## 2023-09-25 MED ORDER — MIRTAZAPINE 15 MG PO TABS
7.5000 mg | ORAL_TABLET | Freq: Every day | ORAL | Status: DC
  Administered 2023-09-25 – 2023-09-29 (×5): 7.5 mg via ORAL
  Filled 2023-09-25 (×5): qty 1

## 2023-09-25 MED ORDER — HYDROMORPHONE HCL 1 MG/ML IJ SOLN: 0.5000 mg | INTRAMUSCULAR | Status: DC | PRN

## 2023-09-25 MED ORDER — PANTOPRAZOLE SODIUM 40 MG PO TBEC
40.0000 mg | DELAYED_RELEASE_TABLET | Freq: Every morning | ORAL | Status: DC
  Administered 2023-09-25 – 2023-09-30 (×6): 40 mg via ORAL
  Filled 2023-09-25 (×6): qty 1

## 2023-09-25 MED ORDER — FLUTICASONE PROPIONATE 50 MCG/ACT NA SUSP
2.0000 | Freq: Every day | NASAL | Status: DC
  Administered 2023-09-25 – 2023-09-30 (×6): 2 via NASAL
  Filled 2023-09-25: qty 16

## 2023-09-25 MED ORDER — FUROSEMIDE 40 MG PO TABS
40.0000 mg | ORAL_TABLET | Freq: Every day | ORAL | Status: DC
Start: 2023-09-25 — End: 2023-09-30
  Administered 2023-09-25 – 2023-09-30 (×6): 40 mg via ORAL
  Filled 2023-09-25 (×6): qty 1

## 2023-09-25 MED ORDER — AMIODARONE HCL 200 MG PO TABS
100.0000 mg | ORAL_TABLET | Freq: Every day | ORAL | Status: DC
  Administered 2023-09-25 – 2023-09-30 (×6): 100 mg via ORAL
  Filled 2023-09-25 (×6): qty 1

## 2023-09-25 MED ORDER — HYDROCODONE-ACETAMINOPHEN 5-325 MG PO TABS
1.0000 | ORAL_TABLET | Freq: Four times a day (QID) | ORAL | Status: DC | PRN
  Administered 2023-09-25 (×3): 2 via ORAL
  Administered 2023-09-26: 1 via ORAL
  Administered 2023-09-26: 2 via ORAL
  Administered 2023-09-27 – 2023-09-30 (×2): 1 via ORAL
  Filled 2023-09-25 (×3): qty 1
  Filled 2023-09-25: qty 2
  Filled 2023-09-25: qty 1
  Filled 2023-09-25 (×3): qty 2

## 2023-09-25 MED ORDER — BUPROPION HCL ER (XL) 150 MG PO TB24
300.0000 mg | ORAL_TABLET | Freq: Every day | ORAL | Status: DC
  Administered 2023-09-25 – 2023-09-30 (×6): 300 mg via ORAL
  Filled 2023-09-25 (×6): qty 2

## 2023-09-25 MED ORDER — CARVEDILOL 12.5 MG PO TABS
12.5000 mg | ORAL_TABLET | Freq: Two times a day (BID) | ORAL | Status: DC
  Administered 2023-09-25 – 2023-09-30 (×11): 12.5 mg via ORAL
  Filled 2023-09-25 (×11): qty 1

## 2023-09-25 MED ORDER — POTASSIUM CHLORIDE CRYS ER 10 MEQ PO TBCR
10.0000 meq | EXTENDED_RELEASE_TABLET | Freq: Two times a day (BID) | ORAL | Status: DC
Start: 2023-09-25 — End: 2023-09-30
  Administered 2023-09-25 – 2023-09-30 (×11): 10 meq via ORAL
  Filled 2023-09-25 (×13): qty 1

## 2023-09-25 MED ORDER — PROPYLENE GLYCOL 0.6 % OP SOLN: 1.0000 [drp] | OPHTHALMIC | Status: DC | PRN

## 2023-09-25 NOTE — H&P (Addendum)
History and Physical    Patient: Yvonne [name] FIE:[id number] DOB: 1929/08/25 DOA: 09/25/2023 DOS: the patient was seen and examined on 09/25/2023 PCP: Thana Ates, MD  Patient coming from: [facility] via [facility]  Chief Complaint:  Chief Complaint  Patient presents with   Fall   HPI: Yvonne [name] is a 89 y.o. female with medical history significant of paroxysmal atrial fibrillation on chronic anticoagulation, congestive heart failure, complete heart block s/p Saint Jude pacemaker, GI bleed, GERD, and arthritis who presents after having a fall.  At baseline she has been living independently in an assisted living facility called [facility] for the last 7 years. The incident occurred when the patient was moving from the bedroom to the hall, intending to take bottled water to the kitchen when she fell.  She was using her rollator at the time. As a result of the fall, the patient is experiencing severe pain in the hip area, described as "hurting horrible".  She also sustained a laceration of the right palm that was sutured closed.  Denies any loss of consciousness, trauma to her head, palpitations, or chest pain symptoms.  She had recently received a tetanus shot and 2023.  The patient reports easily bleeding due to being on Xarelto.  Her last dose was taken yesterday evening around 6-7 PM.  Being here in the hospital she has received morphine for pain which has helped some.  Additionally, the patient mentions she has a pure wick catheter in place since arrival, but feels unable to urinate despite having the urge.  In the emergency department patient was noted be afebrile, respirations 15-26, blood pressures elevated up to 165/107, and O2 saturations maintained 2 L nasal cannula oxygen.  Labs significant for hemoglobin 10.9, BUN 35, creatinine 1.06, and INR 2.  X-rays of the right hip noted an acute femoral neck fracture with varus impaction.  Chest x-ray noted no acute abnormality with pacemaker in place.   Patient was typed and screened for possible need of blood products.  Right hand laceration was repaired by the ED provider. [facility] had been consulted as patient only wanted Dr. Ophelia Charter to do a procedure.  However, he is on vacation for the next 4 days and not on-call so Dr. Everardo Pacific was consulted to evaluate.  Review of Systems: As mentioned in the history of present illness. All other systems reviewed and are negative. Past Medical History:  Diagnosis Date   Anxiety state, unspecified    Arthritis    Cancer (HCC)    basil cell carcinomas removed   Cataract    Complete heart block (HCC)    a. s/p St. Jude PPM 09/26/17.   Esophageal reflux    Hemorrhage of gastrointestinal tract, unspecified    a. occurred on Aleve   History of peptic ulcer    LBBB (left bundle branch block)    Left ventricular systolic dysfunction    hx of mild   Mitral valve insufficiency and aortic valve insufficiency    a. Remote echo 2010: mild focal basal hypertrophy of septum, EF 50-55%, mid-distal inferoseptal myocardium, grade 1 DD, LBBB, mild AI/MR, PASP mildly increased.   PAF (paroxysmal atrial fibrillation) (HCC)    Paroxysmal supraventricular tachycardia (HCC)    Wears glasses    Past Surgical History:  Procedure Laterality Date   ABDOMINAL HYSTERECTOMY     APPENDECTOMY     BREAST SURGERY     biopsy   CATARACT EXTRACTION Left    ESOPHAGOGASTRODUODENOSCOPY  ESOPHAGOGASTRODUODENOSCOPY (EGD) WITH PROPOFOL N/A 10/10/2015   Procedure: ESOPHAGOGASTRODUODENOSCOPY (EGD) WITH PROPOFOL;  Surgeon: Charolett Bumpers, MD;  Location: [facility];  Service: Endoscopy;  Laterality: N/A;   FOOT SURGERY Left    hammer toe and bunion   KNEE ARTHROSCOPY  2009   left   OPEN REDUCTION INTERNAL FIXATION (ORIF) METACARPAL Left 11/05/2013   Procedure: CLOSED REDUCTION/PINNING VS OPEN REDUCTION INTERNAL FIXATION (ORIF) LEFT SMALL  METACARPAL FRACTURE ;  Surgeon: Wyn Forster., MD;  Location: [facility];  Service: Orthopedics;  Laterality: Left;   ORIF HUMERUS FRACTURE Left 04/10/2015   Procedure: OPEN REDUCTION INTERNAL FIXATION (ORIF) PROXIMAL HUMERUS FRACTURE;  Surgeon: Eldred Manges, MD;  Location: [facility];  Service: Orthopedics;  Laterality: Left;   PACEMAKER IMPLANT N/A 09/26/2017   Procedure: PACEMAKER IMPLANT;  Surgeon: Regan Lemming, MD;  [facility];  Service: Cardiovascular;  Laterality: N/A;   TONSILLECTOMY     TUBAL LIGATION     Social History:  reports that she has never smoked. She has never used smokeless tobacco. She reports that she does not drink alcohol and does not use drugs.  Allergies  Allergen Reactions   Aleve [Naproxen Sodium] Other (See Comments)    Gi bleed   Ambien [Zolpidem] Other (See Comments)    Pt states "made me have crazy thoughts"   Flexeril [Cyclobenzaprine] Other (See Comments)    Urinary rentention   Adhesive [Tape] Rash and Other (See Comments)    Skin redness   Aspirin Other (See Comments)    Told to avoid high doses due to history of stomach ulcer    Family History  Problem Relation Age of Onset   Hypertension Mother    Heart failure Father    Stroke Sister    Heart attack Neg Hx     Prior to Admission medications   Medication Sig Start Date End Date Taking? Authorizing Provider  acetaminophen (TYLENOL) 500 MG tablet Take 500-1,000 mg by mouth every 6 (six) hours as needed (for pain).   Yes [provider]  ALPRAZolam (XANAX) 0.25 MG tablet Take 0.25 mg by mouth 2 (two) times daily as needed.   Yes [provider]  amiodarone (PACERONE) 200 MG tablet TAKE ONE-HALF TABLET DAILY 09/15/23  Yes Duke Salvia, MD  buPROPion (WELLBUTRIN XL) 300 MG 24 hr tablet Take 1 tablet by mouth daily. 04/12/19  Yes [provider]  carvedilol (COREG) 12.5 MG tablet Take 1 tablet (12.5 mg total) by mouth 2 (two) times daily. 09/08/23  Yes Duke Salvia, MD  fluticasone [facility]) 50 MCG/ACT nasal  spray Place 2 sprays into both nostrils daily. 2 sprays each nostril at night 07/20/21  Yes Drema Halon, MD  furosemide (LASIX) 20 MG tablet TAKE TWO TABLETS BY MOUTH DAILY 09/22/23  Yes Duke Salvia, MD  mirtazapine (REMERON) 7.5 MG tablet Take 7.5 mg by mouth at bedtime. 08/25/23  Yes [provider]  ondansetron (ZOFRAN) 8 MG tablet Take 8 mg by mouth every 8 (eight) hours as needed for nausea or vomiting.   Yes [provider]  pantoprazole (PROTONIX) 40 MG tablet Take 40 mg by mouth every morning. 08/25/23  Yes [provider]  potassium chloride (K-DUR) 10 MEQ tablet Take 10 mEq by mouth 2 (two) times daily. 11/04/17  Yes [provider]  Propylene Glycol (SYSTANE COMPLETE OP) Apply 1 drop to eye as needed.   Yes [provider]  sodium  chloride (OCEAN) 0.65 % SOLN nasal spray Place 2 sprays into both nostrils daily as needed for congestion.   Yes [provider]  XARELTO 15 MG TABS tablet TAKE ONE TABLET EVERY DAY with SUPPER 09/22/23  Yes Duke Salvia, MD    Physical Exam: Vitals:   09/25/23 0645 09/25/23 0700 09/25/23 0730 09/25/23 0800  BP: 124/68 120/63 116/61 (!) 126/56  Pulse: 78 77 73 78  Resp: 17 17 17 18   Temp:      TempSrc:      SpO2: 97% 94% 92% 96%  Weight:      Height:       Constitutional: Elderly female who appears to be in some discomfort Eyes: PERRL, lids and conjunctivae normal ENMT: Mucous membranes are moist. Posterior pharynx clear of any exudate or lesions.Normal dentition.  Neck: normal, supple  Respiratory: clear to auscultation bilaterally, no wheezing, no crackles.  Patient currently on 2 L nasal cannula oxygen. Cardiovascular: Regular rate and rhythm, no murmurs / rubs / gallops. No extremity edema. 2+ pedal pulses. No carotid bruits.  Abdomen: no tenderness, no masses palpated.  . Bowel sounds positive.  Musculoskeletal: no clubbing / cyanosis.  Right hip is externally rotated and seems  mildly shortened. Skin: no rashes.  Laceration repair of the right ulnar aspect of the hand currently bandaged. Neurologic: CN 2-12 grossly intact.  Strength 5/5 in all 4.  Psychiatric: Normal judgment and insight. Alert and oriented x 3. Normal mood.   Data Reviewed:  EKG revealed atrial sensed and ventricularly paced rhythm at 79 bpm.  Reviewed labs, imaging, and pertinent records as documented.  Assessment and Plan:  Right femoral neck fracture secondary to fall Acute.  Patient normally ambulates with the use of a rollator and had a mechanical fall landing on her right side.  Found to have a right femoral neck fracture with varus impaction.  Dr. Ophelia Charter had performed patient's prior fracture repair and she would like him to do current surgery as well.  Dr. Everardo Pacific, orthopedics on-call for unassigned as Dr. Ophelia Charter is out of town for the next 4 days.  Son was able to talk to his mother and she agrees to have surgery possibly done by Dr. Linna Caprice in the group with Dr. Everardo Pacific tomorrow if possible. -Admit to a surgical telemetry bed -Hip fracture order set utilized -Bedrest -N.p.o. after midnight tomorrow nigh with plan for surgery with Dr. Linna Caprice on 11/30 to give washout period ofr Xarelto -Hydrocodone/Dilaudid IV as needed for moderate severe pain respectively -Orthopedics consulted, will follow-up for any further recommendations  Complete heart block s/p PM Paroxysmal atrial fibrillation on chronic anticoagulation Patient appears to be rate controlled at this time.  Last dose of Xarelto was yesterday evening around 6 PM.  Patient has a [employer] dual pacemaker implanted in 08/2017 for intermittent complete heart block. -Hold Xarelto -Continue Coreg and amiodarone  Heart failure with reduced EF  Chronic.  Patient appears to be euvolemic on physical exam at this time.  Last echocardiogram noted EF to be 40 to 45% with grade 1 diastolic dysfunction back in 2017.CHA2DS2-VASc score equal to at  least 4. -Strict I&Os and daily weights -Continue Coreg and furosemide -Hold Xarelto.  Consider placing on IV heparin if surgery unable to be preformed tomorrow  Essential hypertension Blood pressures currently maintained. -Continue home blood pressure regimen  Normocytic anemia Hemoglobin 10.9 which appears similar to prior check on 11/13. -Recheck CBC tomorrow morning  Anxiety -Continue Xanax as needed and Wellbutrin  Urinary urgency Acute.  Patient reports having the urge to urinate, but unable to despite PureWick being in place. -Check bladder scan -Check urinalysis  GERD -Continue Protonix   DVT prophylaxis: Holding Xarelto Advance Care Planning:   Code Status: Full Code    Consults: Orthopedics  Family Communication: Son updated over the phone  Severity of Illness: The appropriate patient status for this patient is INPATIENT. Inpatient status is judged to be reasonable and necessary in order to provide the required intensity of service to ensure the patient's safety. The patient's presenting symptoms, physical exam findings, and initial radiographic and laboratory data in the context of their chronic comorbidities is felt to place them at high risk for further clinical deterioration. Furthermore, it is not anticipated that the patient will be medically stable for discharge from the hospital within 2 midnights of admission.   * I certify that at the point of admission it is my clinical judgment that the patient will require inpatient hospital care spanning beyond 2 midnights from the point of admission due to high intensity of service, high risk for further deterioration and high frequency of surveillance required.*  Author: Clydie Braun, MD 09/25/2023 8:39 AM  For on call review [email or web address].

## 2023-09-25 NOTE — ED Notes (Signed)
ED TO INPATIENT HANDOFF REPORT  ED Nurse Name and Phone #: Murlean Iba Paramedic [phone]  S Name/Age/Gender Yvonne [name] 89 y.o. female Room/Bed: 029C/029C  Code Status   Code Status: Prior  Home/SNF/Other Nursing Home Patient oriented to: self, place, time, and situation Is this baseline? Yes   Triage Complete: Triage complete  Chief Complaint Hip fracture (HCC) [S72.009A]  Triage Note Pt BIB EMS from ALF. EMS reports mechanical fall, Shortening and rotation of R leg, 2 inch lac R hand. No LOC, denies hitting head  Pt takes Xarelto   EMS VS 160/81, HR 80, 94% RA, cbg 128   Allergies Allergies  Allergen Reactions   Aleve [Naproxen Sodium] Other (See Comments)    Gi bleed   Ambien [Zolpidem] Other (See Comments)    Pt states "made me have crazy thoughts"   Flexeril [Cyclobenzaprine] Other (See Comments)    Urinary rentention   Adhesive [Tape] Rash and Other (See Comments)    Skin redness   Aspirin Other (See Comments)    Told to avoid high doses due to history of stomach ulcer    Level of Care/Admitting Diagnosis ED Disposition     ED Disposition  Admit   Condition  --   Comment  Hospital Area: [facility] [[id number]]  Level of Care: Telemetry Surgical [105]  May admit patient to [facility] or [facility] if equivalent level of care is available:: No  Covid Evaluation: Asymptomatic - no recent exposure (last 10 days) testing not required  Diagnosis: Hip fracture [facility]) [[id number]]  Admitting Physician: Clydie Braun [[id number]]  Attending Physician: Clydie Braun [[id number]]  Certification:: I certify this patient will need inpatient services for at least 2 midnights  Expected Medical Readiness: 09/27/2023          B Medical/Surgery History Past Medical History:  Diagnosis Date   Anxiety state, unspecified    Arthritis    Cancer (HCC)    basil cell carcinomas removed   Cataract    Complete heart block (HCC)    a. s/p St. Jude PPM  09/26/17.   Esophageal reflux    Hemorrhage of gastrointestinal tract, unspecified    a. occurred on Aleve   History of peptic ulcer    LBBB (left bundle Reneshia Zuccaro block)    Left ventricular systolic dysfunction    hx of mild   Mitral valve insufficiency and aortic valve insufficiency    a. Remote echo 2010: mild focal basal hypertrophy of septum, EF 50-55%, mid-distal inferoseptal myocardium, grade 1 DD, LBBB, mild AI/MR, PASP mildly increased.   PAF (paroxysmal atrial fibrillation) (HCC)    Paroxysmal supraventricular tachycardia (HCC)    Wears glasses    Past Surgical History:  Procedure Laterality Date   ABDOMINAL HYSTERECTOMY     APPENDECTOMY     BREAST SURGERY     biopsy   CATARACT EXTRACTION Left    ESOPHAGOGASTRODUODENOSCOPY     ESOPHAGOGASTRODUODENOSCOPY (EGD) WITH PROPOFOL N/A 10/10/2015   Procedure: ESOPHAGOGASTRODUODENOSCOPY (EGD) WITH PROPOFOL;  Surgeon: Charolett Bumpers, MD;  Location: [facility];  Service: Endoscopy;  Laterality: N/A;   FOOT SURGERY Left    hammer toe and bunion   KNEE ARTHROSCOPY  2009   left   OPEN REDUCTION INTERNAL FIXATION (ORIF) METACARPAL Left 11/05/2013   Procedure: CLOSED REDUCTION/PINNING VS OPEN REDUCTION INTERNAL FIXATION (ORIF) LEFT SMALL  METACARPAL FRACTURE ;  Surgeon: Wyn Forster., MD;  Location: [facility];  Service: Orthopedics;  Laterality:  Left;   ORIF HUMERUS FRACTURE Left 04/10/2015   Procedure: OPEN REDUCTION INTERNAL FIXATION (ORIF) PROXIMAL HUMERUS FRACTURE;  Surgeon: Eldred Manges, MD;  Location: [facility];  Service: Orthopedics;  Laterality: Left;   PACEMAKER IMPLANT N/A 09/26/2017   Procedure: PACEMAKER IMPLANT;  Surgeon: Regan Lemming, MD;  Location[facility];  Service: Cardiovascular;  Laterality: N/A;   TONSILLECTOMY     TUBAL LIGATION       A IV Location/Drains/Wounds Patient Lines/Drains/Airways Status     Active Line/Drains/Airways     Name Placement date Placement time Site  Days   Peripheral IV 09/25/23 20 G Right Antecubital 09/25/23  0442  Antecubital  less than 1            Intake/Output Last 24 hours No intake or output data in the 24 hours ending 09/25/23 0848  Labs/Imaging Results for orders placed or performed during the hospital encounter of 09/25/23 (from the past 48 hour(s))  CBC with Differential     Status: Abnormal   Collection Time: 09/25/23  4:38 AM  Result Value Ref Range   WBC 7.7 4.0 - 10.5 K/uL   RBC 3.72 (L) 3.87 - 5.11 MIL/uL   Hemoglobin 10.9 (L) 12.0 - 15.0 g/dL   HCT 16.1 (L) 09.6 - 04.5 %   MCV 93.8 80.0 - 100.0 fL   MCH 29.3 26.0 - 34.0 pg   MCHC 31.2 30.0 - 36.0 g/dL   RDW 40.9 (H) 81.1 - 91.4 %   Platelets 271 150 - 400 K/uL   nRBC 0.0 0.0 - 0.2 %   Neutrophils Relative % 72 %   Neutro Abs 5.6 1.7 - 7.7 K/uL   Lymphocytes Relative 16 %   Lymphs Abs 1.3 0.7 - 4.0 K/uL   Monocytes Relative 8 %   Monocytes Absolute 0.6 0.1 - 1.0 K/uL   Eosinophils Relative 2 %   Eosinophils Absolute 0.2 0.0 - 0.5 K/uL   Basophils Relative 1 %   Basophils Absolute 0.0 0.0 - 0.1 K/uL   Immature Granulocytes 1 %   Abs Immature Granulocytes 0.07 0.00 - 0.07 K/uL    Comment: Performed at [facility], [address], [place], [place] [address]  Comprehensive metabolic panel     Status: Abnormal   Collection Time: 09/25/23  4:38 AM  Result Value Ref Range   Sodium 139 135 - 145 mmol/L   Potassium 3.7 3.5 - 5.1 mmol/L   Chloride 110 98 - 111 mmol/L   CO2 23 22 - 32 mmol/L   Glucose, Bld 100 (H) 70 - 99 mg/dL    Comment: Glucose reference range applies only to samples taken after fasting for at least 8 hours.   BUN 35 (H) 8 - 23 mg/dL   Creatinine, Ser 6.21 (H) 0.44 - 1.00 mg/dL   Calcium 8.9 8.9 - 30.8 mg/dL   Total Protein 6.6 6.5 - 8.1 g/dL   Albumin 3.7 3.5 - 5.0 g/dL   AST 26 15 - 41 U/L   ALT 20 0 - 44 U/L   Alkaline Phosphatase 78 38 - 126 U/L   Total Bilirubin 0.3 <1.2 mg/dL   GFR, Estimated 49 (L) >60 mL/min     Comment: (NOTE) Calculated using the CKD-EPI Creatinine Equation (2021)    Anion gap 6 5 - 15    Comment: Performed at [facility], [address], [place], [place] [address]  Protime-INR     Status: Abnormal  Collection Time: 09/25/23  4:38 AM  Result Value Ref Range   Prothrombin Time 22.6 (H) 11.4 - 15.2 seconds   INR 2.0 (H) 0.8 - 1.2    Comment: (NOTE) INR goal varies based on device and disease states. Performed at [facility], [address], [place], [place] [address]   Type and screen     Status: None   Collection Time: 09/25/23  4:38 AM  Result Value Ref Range   ABO/RH(D) O POS    Antibody Screen NEG    Sample Expiration      09/28/2023,2359 Performed at [facility], [address] [place], [place] [address]   Magnesium     Status: Abnormal   Collection Time: 09/25/23  4:38 AM  Result Value Ref Range   Magnesium 2.6 (H) 1.7 - 2.4 mg/dL    Comment: Performed at [facility], [address], [place], [place] [address]  ABO/Rh     Status: None   Collection Time: 09/25/23  4:48 AM  Result Value Ref Range   ABO/RH(D)      O POS Performed at [facility], [address], [place], [place] [address]    DG Chest Port 1 View  Result Date: 09/25/2023 CLINICAL DATA:  89 year old female status post fall, right hip fracture. EXAM: PORTABLE CHEST 1 VIEW COMPARISON:  Chest radiographs 07/29/2022 and earlier. FINDINGS: Portable AP semi upright view at 0417 hours. Chronically tortuous and calcified thoracic aorta. Chronic left chest dual lead cardiac pacemaker. Stable cardiac size and mediastinal contours. Visualized tracheal air column is within normal limits. Allowing for portable technique the lungs are clear. Chronic proximal left humerus ORIF. No acute osseous abnormality identified. IMPRESSION: 1. No acute cardiopulmonary abnormality. 2. Chronic pacemaker, Aortic Atherosclerosis (ICD10-I70.0). Electronically Signed   By: Odessa Fleming M.D.   On:  09/25/2023 05:27   DG Hip Port Unilat W or Wo Pelvis 1 View Right  Result Date: 09/25/2023 CLINICAL DATA:  89 year old female status post fall on blood thinners. EXAM: DG HIP (WITH OR WITHOUT PELVIS) 1V PORT RIGHT COMPARISON:  CT Abdomen and Pelvis 06/23/2015. FINDINGS: Portable AP supine view at 0420 hours. Acute right femoral neck fracture with impaction, minor varus angulation. On all three views the intertrochanteric segment of the proximal right femur appears to remain intact. Right femoral head remains normally located. No superimposed fracture of the pelvis identified. Left femoral head appears normally located in grossly intact. Nonobstructed visible bowel gas pattern. IMPRESSION: Acute right femoral neck fracture with varus impaction. Electronically Signed   By: Odessa Fleming M.D.   On: 09/25/2023 05:26    Pending Labs Unresulted Labs (From admission, onward)    None       Vitals/Pain Today's Vitals   09/25/23 0730 09/25/23 0733 09/25/23 0800 09/25/23 0830  BP: 116/61  (!) 126/56 119/63  Pulse: 73  78 80  Resp: 17  18 18   Temp:      TempSrc:      SpO2: 92%  96% 91%  Weight:      Height:      PainSc:  4       Isolation Precautions No active isolations  Medications Medications  HYDROmorphone (DILAUDID) injection 0.5 mg (has no administration in time range)  prochlorperazine (COMPAZINE) injection 5 mg (has no administration in time range)  lidocaine (PF) (XYLOCAINE) 1 % injection 5 mL (5 mLs Intradermal Given 09/25/23 0455)  fentaNYL (SUBLIMAZE) injection 50 mcg (50 mcg Intravenous  Given 09/25/23 0455)  morphine (PF) 4 MG/ML injection 4 mg (4 mg Intravenous Given 09/25/23 4098)    Mobility non-ambulatory     Focused Assessments     R Recommendations: See Admitting Provider Note  Report given to:   Additional Notes:  On 2 lpm [place]

## 2023-09-25 NOTE — Progress Notes (Signed)
Pt.'s son Lynnell Chakrabarti updated via phone call on pt.s upcoming hip surgery on Saturday; Nov 30,'24 at 7:15 am to be done by Dr. Samson Frederic.

## 2023-09-25 NOTE — Progress Notes (Signed)
Pt refused to order lunch and dinner today saying "I'm not hungry".

## 2023-09-25 NOTE — Progress Notes (Signed)
Initial Nutrition Assessment  DOCUMENTATION CODES:   Not applicable  INTERVENTION:  Advance diet as Medially applicable. Once advanced recommend; Liberalized diet Ensure Plus High Protein po BID, each supplement provides 350 kcal and 20 grams of protein. Daily multi vitamin with minerals   NUTRITION DIAGNOSIS:   Increased nutrient needs related to hip fracture as evidenced by estimated needs.    GOAL:   Patient will meet greater than or equal to 90% of their needs    MONITOR:   PO intake, Supplement acceptance, Diet advancement, Labs  REASON FOR ASSESSMENT:   Consult Assessment of nutrition requirement/status  ASSESSMENT:   89 y.o.f, that lives independently in an assisted living facility, presented with right hip pain after fall in home.  PMH: anxiety, arthritis, cancer, complete heart block s/p Saint Jude pacemaker, GI bleed, GERD, PAF. Pt currently NPO pending surgery.  Review of EMR revealed,  independent living ability, with independent feeding ability, weight stability, no appetite changes, or chewing and swallowing concerns.  Consumes regular diet out side of hospital.  Currently due to NPO status unable to implement any dietary recommendations at this time, although once diet advanced to oral status pt would benefit from Ensure Plus High Protein po BID, each supplement provides 350 kcal and 20 grams of protein. Daily MVM and liberalized diet due to advanced age and increased nutrient needs.   Admit weight: 65.2 kg Current weight: 65.2 kg  Weight history; 09/25/23 65.2 kg  09/10/23 65.2 kg  09/01/22 52.2 kg  05/13/22 53.4 kg   Last Weight  Most recent update: 09/25/2023  4:48 AM    Weight  65.2 kg (143 lb 12.8 oz)             Average Meal Intake: NPO  Nutritionally Relevant Medications: Scheduled Meds:  amiodarone  100 mg Oral Daily   buPROPion  300 mg Oral Daily   carvedilol  12.5 mg Oral BID   furosemide  40 mg Oral Daily   mirtazapine  7.5  mg Oral QHS     Labs Reviewed    NUTRITION - FOCUSED PHYSICAL EXAM:  Deferred  Diet Order:   Diet Order             Diet NPO time specified Except for: Sips with Meds  Diet effective midnight           Diet Heart Room service appropriate? Yes; Fluid consistency: Thin  Diet effective now                   EDUCATION NEEDS:   Not appropriate for education at this time  Skin:  Skin Assessment: Reviewed RN Assessment  Last BM:  PTA  Height:   Ht Readings from Last 1 Encounters:  09/25/23 5\' 7"  (1.702 m)    Weight:   Wt Readings from Last 1 Encounters:  09/25/23 65.2 kg    Ideal Body Weight:     BMI:  Body mass index is 22.52 kg/m.  Estimated Nutritional Needs:   Kcal:  2000-2300 kcal/d  Protein:  85-100 g/d  Fluid:  18ml/kcal    Jamelle Haring RDN, LDN Clinical Dietitian  RDN pager # available on Amion

## 2023-09-25 NOTE — Progress Notes (Signed)
Orthopedic Tech Progress Note Patient Details:  Yvonne [name] [birth year] [id number]  Level 2 trauma   Patient ID: Yvonne [name], female   DOB: May 06, 1929, 89 y.o.   MRN: [id number]  Yvonne [name] 09/25/2023, 4:45 AM

## 2023-09-25 NOTE — ED Triage Notes (Signed)
Pt BIB EMS from ALF. EMS reports mechanical fall, Shortening and rotation of R leg, 2 inch lac R hand. No LOC, denies hitting head  Pt takes Xarelto   EMS VS 160/81, HR 80, 94% RA, cbg 128

## 2023-09-25 NOTE — ED Provider Notes (Signed)
[place] EMERGENCY DEPARTMENT AT [facility] Provider Note   CSN: [id number] Arrival date & time: 09/25/23  9562     History  Chief Complaint  Patient presents with   Yvonne [name]    Yvonne [name] is a 89 y.o. female.  The history is provided by the patient and medical records.  Fall  89 y.o. F with hx of AFIB on xarelto, GERD, PUD, presenting to the ED following a fall at her assisted living facility.  She lost her footing, tried to catch herself on the wall but cut her right hand in the process.  Fell onto right side.  No head injury or LOC.  Has been AAOx3 with EMS.  Ongoing pain in right hip, worse if trying to move it or stand up.  Tetanus UTD (given 2023). Last dose of xarelto evening of 09/24/23.  Home Medications Prior to Admission medications   Medication Sig Start Date End Date Taking? Authorizing Provider  acetaminophen (TYLENOL) 500 MG tablet Take 500-1,000 mg by mouth every 6 (six) hours as needed (for pain).    [provider]  ALPRAZolam Prudy Feeler) 0.25 MG tablet Take 0.25 mg by mouth 2 (two) times daily as needed.    [provider]  amiodarone (PACERONE) 200 MG tablet TAKE ONE-HALF TABLET DAILY 09/15/23   Duke Salvia, MD  buPROPion (WELLBUTRIN XL) 300 MG 24 hr tablet Take 1 tablet by mouth daily. 04/12/19   [provider]  carvedilol (COREG) 12.5 MG tablet Take 1 tablet (12.5 mg total) by mouth 2 (two) times daily. 09/08/23   Duke Salvia, MD  clorazepate (TRANXENE) 7.5 MG tablet Take 7.5 mg by mouth 3 (three) times daily as needed for anxiety.     [provider]  estradiol (ESTRACE) 0.5 MG tablet Take 0.5 mg by mouth daily.    [provider]  fluticasone (FLONASE) 50 MCG/ACT nasal spray Place 2 sprays into both nostrils daily as needed for allergies.  02/25/15   [provider]  fluticasone (FLONASE) 50 MCG/ACT nasal spray Place 2 sprays into both nostrils daily. 2 sprays each nostril at night 07/20/21    Drema Halon, MD  furosemide (LASIX) 20 MG tablet TAKE TWO TABLETS BY MOUTH DAILY 09/22/23   Duke Salvia, MD  mirtazapine (REMERON) 7.5 MG tablet Take 7.5 mg by mouth at bedtime. 08/25/23   [provider]  ondansetron (ZOFRAN) 8 MG tablet Take 8 mg by mouth every 8 (eight) hours as needed for nausea or vomiting.    [provider]  pantoprazole (PROTONIX) 40 MG tablet Take 40 mg by mouth every morning. 08/25/23   [provider]  potassium chloride (K-DUR) 10 MEQ tablet Take 10 mEq by mouth 2 (two) times daily. 11/04/17   [provider]  sodium chloride (OCEAN) 0.65 % SOLN nasal spray Place 2 sprays into both nostrils daily as needed for congestion.    [provider]  XARELTO 15 MG TABS tablet TAKE ONE TABLET EVERY DAY with SUPPER 09/22/23   Duke Salvia, MD      Allergies    Aleve [naproxen sodium], Ambien [zolpidem], Flexeril [cyclobenzaprine], Adhesive [tape], and Aspirin    Review of Systems   Review of Systems  Musculoskeletal:  Positive for arthralgias.  Skin:  Positive for wound.  All other systems reviewed and are negative.   Physical Exam Updated Vital Signs BP (!) 151/92   Pulse 73   Temp 97.7 F (36.5 C) (Oral)  Resp 16   Ht 5\' 7"  (1.702 m)   Wt 65.2 kg   SpO2 97%   BMI 22.52 kg/m   Physical Exam Vitals and nursing note reviewed.  Constitutional:      Appearance: She is well-developed.  HENT:     Head: Normocephalic and atraumatic.     Comments: No visible head trauma Eyes:     Conjunctiva/sclera: Conjunctivae normal.     Pupils: Pupils are equal, round, and reactive to light.  Cardiovascular:     Rate and Rhythm: Normal rate and regular rhythm.     Heart sounds: Normal heart sounds.  Pulmonary:     Effort: Pulmonary effort is normal.     Breath sounds: Normal breath sounds.  Abdominal:     General: Bowel sounds are normal.     Palpations: Abdomen is soft.  Musculoskeletal:        General:  Normal range of motion.     Cervical back: Normal range of motion.     Comments: Laceration right palm, very jagged and irregular, slowly oozing blood but nothing pulsatile Right hip has a slight deformity, leg is shortened and externally rotated, DP pulse intact, able to wiggle toes on command  Skin:    General: Skin is warm and dry.  Neurological:     Mental Status: She is alert and oriented to person, place, and time.    ED Results / Procedures / Treatments   Labs (all labs ordered are listed, but only abnormal results are displayed) Labs Reviewed  CBC WITH DIFFERENTIAL/PLATELET - Abnormal; Notable for the following components:      Result Value   RBC 3.72 (*)    Hemoglobin 10.9 (*)    HCT 34.9 (*)    RDW 15.7 (*)    All other components within normal limits  COMPREHENSIVE METABOLIC PANEL - Abnormal; Notable for the following components:   Glucose, Bld 100 (*)    BUN 35 (*)    Creatinine, Ser 1.06 (*)    GFR, Estimated 49 (*)    All other components within normal limits  PROTIME-INR - Abnormal; Notable for the following components:   Prothrombin Time 22.6 (*)    INR 2.0 (*)    All other components within normal limits  TYPE AND SCREEN  ABO/RH    EKG None  Radiology DG Chest Port 1 View  Result Date: 09/25/2023 CLINICAL DATA:  89 year old female status post fall, right hip fracture. EXAM: PORTABLE CHEST 1 VIEW COMPARISON:  Chest radiographs 07/29/2022 and earlier. FINDINGS: Portable AP semi upright view at 0417 hours. Chronically tortuous and calcified thoracic aorta. Chronic left chest dual lead cardiac pacemaker. Stable cardiac size and mediastinal contours. Visualized tracheal air column is within normal limits. Allowing for portable technique the lungs are clear. Chronic proximal left humerus ORIF. No acute osseous abnormality identified. IMPRESSION: 1. No acute cardiopulmonary abnormality. 2. Chronic pacemaker, Aortic Atherosclerosis (ICD10-I70.0). Electronically  Signed   By: Odessa Fleming M.D.   On: 09/25/2023 05:27   DG Hip Port Unilat W or Wo Pelvis 1 View Right  Result Date: 09/25/2023 CLINICAL DATA:  89 year old female status post fall on blood thinners. EXAM: DG HIP (WITH OR WITHOUT PELVIS) 1V PORT RIGHT COMPARISON:  CT Abdomen and Pelvis 06/23/2015. FINDINGS: Portable AP supine view at 0420 hours. Acute right femoral neck fracture with impaction, minor varus angulation. On all three views the intertrochanteric segment of the proximal right femur appears to remain intact. Right femoral head remains normally located.  No superimposed fracture of the pelvis identified. Left femoral head appears normally located in grossly intact. Nonobstructed visible bowel gas pattern. IMPRESSION: Acute right femoral neck fracture with varus impaction. Electronically Signed   By: Odessa Fleming M.D.   On: 09/25/2023 05:26    Procedures Procedures    LACERATION REPAIR Performed by: Garlon Hatchet Authorized by: Garlon Hatchet Consent: Verbal consent obtained. Risks and benefits: risks, benefits and alternatives were discussed Consent given by: patient Patient identity confirmed: provided demographic data Prepped and Draped in normal sterile fashion Wound explored  Laceration Location: right palm  Laceration Length: 5cm, very jagged and irregular  No Foreign Bodies seen or palpated  Anesthesia: local infiltration  Local anesthetic: lidocaine 1% without epinephrine  Anesthetic total: 5 ml  Irrigation method: syringe Amount of cleaning: standard  Skin closure: 4-0 prolene  Number of sutures: 7  Technique: simple interrupted  Patient tolerance: Patient tolerated the procedure well with no immediate complications.   Medications Ordered in ED Medications  lidocaine (PF) (XYLOCAINE) 1 % injection 5 mL (5 mLs Intradermal Given 09/25/23 0455)  fentaNYL (SUBLIMAZE) injection 50 mcg (50 mcg Intravenous Given 09/25/23 0455)  morphine (PF) 4 MG/ML injection 4 mg (4  mg Intravenous Given 09/25/23 9629)    ED Course/ Medical Decision Making/ A&P                                 Medical Decision Making Amount and/or Complexity of Data Reviewed Labs: ordered. Radiology: ordered and independent interpretation performed. ECG/medicine tests: ordered and independent interpretation performed.  Risk Prescription drug management. Decision regarding hospitalization.   89 year old female presenting to the ED after a fall.  She was walking to the bathroom when she lost her footing, tried to catch herself on the wall and cut her hand, fell on the right hip.  There was no head injury or loss of consciousness.  She did sustain laceration to right palm and has significant pain in her right hip, worse with attempted movement.  Her right leg is shortened and externally rotated but remains neurovascularly intact.  Jagged laceration of right palm that will require repair.  No visible head trauma, she is AAO x 4.  Hip films with right femoral neck fracture.  Chest x-ray is clear.  Her tetanus is up-to-date.  Laceration repaired as above, tolerated well.  Labs without leukocytosis or significant electrolyte derangement.  Type and screen has been sent.  Will discuss with orthopedics and plan for medical admission.  Spoke with orthopedics, Dr. Denese Killings-- not sure if anyone from his practice will be able to repair this given the holiday.  If not able to find anyone, may have to default to unassigned provider.   Spoke with Dr. Joneen Roach-- will admit for ongoing care.  Final Clinical Impression(s) / ED Diagnoses Final diagnoses:  Closed fracture of right hip, initial encounter [facility])  Laceration of right hand without foreign body, initial encounter    Rx / DC Orders ED Discharge Orders     None         Garlon Hatchet, PA-C 09/25/23 0636    Sabas Sous, MD 09/25/23 [phone]

## 2023-09-26 ENCOUNTER — Inpatient Hospital Stay (HOSPITAL_COMMUNITY): Payer: PPO

## 2023-09-26 DIAGNOSIS — S72001A Fracture of unspecified part of neck of right femur, initial encounter for closed fracture: Secondary | ICD-10-CM | POA: Diagnosis not present

## 2023-09-26 LAB — CBC
HCT: 33.6 % — ABNORMAL LOW (ref 36.0–46.0)
Hemoglobin: 10.6 g/dL — ABNORMAL LOW (ref 12.0–15.0)
MCH: 28.5 pg (ref 26.0–34.0)
MCHC: 31.5 g/dL (ref 30.0–36.0)
MCV: 90.3 fL (ref 80.0–100.0)
Platelets: 248 10*3/uL (ref 150–400)
RBC: 3.72 MIL/uL — ABNORMAL LOW (ref 3.87–5.11)
RDW: 15.1 % (ref 11.5–15.5)
WBC: 11.3 10*3/uL — ABNORMAL HIGH (ref 4.0–10.5)
nRBC: 0 % (ref 0.0–0.2)

## 2023-09-26 LAB — BASIC METABOLIC PANEL
Anion gap: 10 (ref 5–15)
BUN: 26 mg/dL — ABNORMAL HIGH (ref 8–23)
CO2: 25 mmol/L (ref 22–32)
Calcium: 9.1 mg/dL (ref 8.9–10.3)
Chloride: 100 mmol/L (ref 98–111)
Creatinine, Ser: 1.11 mg/dL — ABNORMAL HIGH (ref 0.44–1.00)
GFR, Estimated: 47 mL/min — ABNORMAL LOW (ref >60)
Glucose, Bld: 92 mg/dL (ref 70–99)
Potassium: 3.5 mmol/L (ref 3.5–5.1)
Sodium: 135 mmol/L (ref 135–145)

## 2023-09-26 MED ORDER — CEFAZOLIN SODIUM-DEXTROSE 2-4 GM/100ML-% IV SOLN
2.0000 g | INTRAVENOUS | Status: AC
  Administered 2023-09-27: 2 g via INTRAVENOUS

## 2023-09-26 MED ORDER — TRANEXAMIC ACID-NACL 1000-0.7 MG/100ML-% IV SOLN
1000.0000 mg | INTRAVENOUS | Status: AC
  Administered 2023-09-27: 1000 mg via INTRAVENOUS

## 2023-09-26 MED ORDER — ACETAMINOPHEN 500 MG PO TABS: 1000.0000 mg | ORAL_TABLET | Freq: Once | ORAL | Status: AC

## 2023-09-26 MED ORDER — POTASSIUM CHLORIDE 20 MEQ PO PACK
40.0000 meq | PACK | Freq: Once | ORAL | Status: AC
  Administered 2023-09-26: 40 meq via ORAL
  Filled 2023-09-26: qty 2

## 2023-09-26 MED ORDER — POVIDONE-IODINE 10 % EX SWAB
2.0000 | Freq: Once | CUTANEOUS | Status: AC
  Administered 2023-09-27: 2 via TOPICAL

## 2023-09-26 MED ORDER — CHLORHEXIDINE GLUCONATE 4 % EX SOLN
60.0000 mL | Freq: Once | CUTANEOUS | Status: DC
  Administered 2023-09-27: 4 via TOPICAL

## 2023-09-26 NOTE — Plan of Care (Signed)
  Problem: Education: Goal: Knowledge of General Education information will improve Description: Including pain rating scale, medication(s)/side effects and non-pharmacologic comfort measures Outcome: Progressing   Problem: Health Behavior/Discharge Planning: Goal: Ability to manage health-related needs will improve Outcome: Progressing   Problem: Clinical Measurements: Goal: Will remain free from infection Outcome: Progressing   

## 2023-09-26 NOTE — Plan of Care (Signed)
  Problem: Education: Goal: Knowledge of General Education information will improve Description Including pain rating scale, medication(s)/side effects and non-pharmacologic comfort measures Outcome: Progressing   Problem: Health Behavior/Discharge Planning: Goal: Ability to manage health-related needs will improve Outcome: Progressing   

## 2023-09-26 NOTE — Progress Notes (Addendum)
[facility] Pre-op Review  Pre-op checklist: to be completed by bedside RN  NPO: Ordered to start at midnight  Labs: PCR awaiting collection  Consent: In sign and held  H&P: Hospitalist  Vitals: WNL  O2 requirements: [place]  MAR/PTA review: Last dose Xarelto 11/28 PM  IV: 20g  Floor nurse name:  Genevie Ann, RN   Additional info:   Pacemaker: St. Jude

## 2023-09-26 NOTE — H&P (View-Only) (Signed)
ORTHOPAEDIC CONSULTATION  REQUESTING PHYSICIAN: Joseph Art, DO  PCP:  Thana Ates, MD  Chief Complaint: Right hip pain.   HPI: Yvonne [name] is a 89 y.o. female female with medical history significant of paroxysmal atrial fibrillation on chronic anticoagulation, congestive heart failure, complete heart block s/p [employer] Jude pacemaker, GI bleed, GERD, and arthritis who presents after having a fall.  At baseline she has been living independently in an assisted living facility, [facility], for the last 7 years. Patient had a mechanical ground level fall, using her rollator. Right hip pain and inability to bear weight. X-rays showed right femoral neck fracture. Orthopedics consulted.   Patient has limited daily activity. Uses a rollator at baseline. She takes xarelto per baseline.   Past Medical History:  Diagnosis Date   Anxiety state, unspecified    Arthritis    Cancer (HCC)    basil cell carcinomas removed   Cataract    Complete heart block (HCC)    a. s/p St. Jude PPM 09/26/17.   Esophageal reflux    Hemorrhage of gastrointestinal tract, unspecified    a. occurred on Aleve   History of peptic ulcer    LBBB (left bundle branch block)    Left ventricular systolic dysfunction    hx of mild   Mitral valve insufficiency and aortic valve insufficiency    a. Remote echo 2010: mild focal basal hypertrophy of septum, EF 50-55%, mid-distal inferoseptal myocardium, grade 1 DD, LBBB, mild AI/MR, PASP mildly increased.   PAF (paroxysmal atrial fibrillation) (HCC)    Paroxysmal supraventricular tachycardia (HCC)    Wears glasses    Past Surgical History:  Procedure Laterality Date   ABDOMINAL HYSTERECTOMY     APPENDECTOMY     BREAST SURGERY     biopsy   CATARACT EXTRACTION Left    ESOPHAGOGASTRODUODENOSCOPY     ESOPHAGOGASTRODUODENOSCOPY (EGD) WITH PROPOFOL N/A 10/10/2015   Procedure: ESOPHAGOGASTRODUODENOSCOPY (EGD) WITH PROPOFOL;  Surgeon: Charolett Bumpers, MD;  Location:  [facility];  Service: Endoscopy;  Laterality: N/A;   FOOT SURGERY Left    hammer toe and bunion   KNEE ARTHROSCOPY  2009   left   OPEN REDUCTION INTERNAL FIXATION (ORIF) METACARPAL Left 11/05/2013   Procedure: CLOSED REDUCTION/PINNING VS OPEN REDUCTION INTERNAL FIXATION (ORIF) LEFT SMALL  METACARPAL FRACTURE ;  Surgeon: Wyn Forster., MD;  Location: [facility];  Service: Orthopedics;  Laterality: Left;   ORIF HUMERUS FRACTURE Left 04/10/2015   Procedure: OPEN REDUCTION INTERNAL FIXATION (ORIF) PROXIMAL HUMERUS FRACTURE;  Surgeon: Eldred Manges, MD;  Location: [facility] OR;  Service: Orthopedics;  Laterality: Left;   PACEMAKER IMPLANT N/A 09/26/2017   Procedure: PACEMAKER IMPLANT;  Surgeon: Regan Lemming, MD;  Location: [facility];  Service: Cardiovascular;  Laterality: N/A;   TONSILLECTOMY     TUBAL LIGATION     Social History   Socioeconomic History   Marital status: Married    Spouse name: Not on file   Number of children: Not on file   Years of education: Not on file   Highest education level: Not on file  Occupational History   Not on file  Tobacco Use   Smoking status: Never   Smokeless tobacco: Never  Substance and Sexual Activity   Alcohol use: No   Drug use: No   Sexual activity: Not on file  Other Topics Concern   Not on file  Social History Narrative   Not on file  Social Determinants of Health   Financial Resource Strain: Not on file  Food Insecurity: No Food Insecurity (09/25/2023)   Hunger Vital Sign    Worried About Running Out of Food in the Last Year: Never true    Ran Out of Food in the Last Year: Never true  Transportation Needs: No Transportation Needs (09/25/2023)   PRAPARE - Administrator, Civil Service (Medical): No    Lack of Transportation (Non-Medical): No  Physical Activity: Not on file  Stress: Not on file  Social Connections: Not on file   Family History  Problem Relation Age of Onset    Hypertension Mother    Heart failure Father    Stroke Sister    Heart attack Neg Hx    Allergies  Allergen Reactions   Aleve [Naproxen Sodium] Other (See Comments)    Gi bleed   Ambien [Zolpidem] Other (See Comments)    Pt states "made me have crazy thoughts"   Flexeril [Cyclobenzaprine] Other (See Comments)    Urinary rentention   Adhesive [Tape] Rash and Other (See Comments)    Skin redness   Aspirin Other (See Comments)    Told to avoid high doses due to history of stomach ulcer   Prior to Admission medications   Medication Sig Start Date End Date Taking? Authorizing Provider  acetaminophen (TYLENOL) 500 MG tablet Take 500-1,000 mg by mouth every 6 (six) hours as needed (for pain).   Yes [provider]  ALPRAZolam (XANAX) 0.25 MG tablet Take 0.25 mg by mouth 2 (two) times daily as needed.   Yes [provider]  amiodarone (PACERONE) 200 MG tablet TAKE ONE-HALF TABLET DAILY 09/15/23  Yes Duke Salvia, MD  buPROPion (WELLBUTRIN XL) 300 MG 24 hr tablet Take 1 tablet by mouth daily. 04/12/19  Yes [provider]  carvedilol (COREG) 12.5 MG tablet Take 1 tablet (12.5 mg total) by mouth 2 (two) times daily. 09/08/23  Yes Duke Salvia, MD  fluticasone [facility]) 50 MCG/ACT nasal spray Place 2 sprays into both nostrils daily. 2 sprays each nostril at night 07/20/21  Yes Drema Halon, MD  furosemide (LASIX) 20 MG tablet TAKE TWO TABLETS BY MOUTH DAILY 09/22/23  Yes Duke Salvia, MD  mirtazapine (REMERON) 7.5 MG tablet Take 7.5 mg by mouth at bedtime. 08/25/23  Yes [provider]  ondansetron (ZOFRAN) 8 MG tablet Take 8 mg by mouth every 8 (eight) hours as needed for nausea or vomiting.   Yes [provider]  pantoprazole (PROTONIX) 40 MG tablet Take 40 mg by mouth every morning. 08/25/23  Yes [provider]  potassium chloride (K-DUR) 10 MEQ tablet Take 10 mEq by mouth 2 (two) times daily. 11/04/17  Yes [provider]  Propylene Glycol (SYSTANE COMPLETE OP) Apply 1 drop to eye as needed.   Yes [provider]  sodium chloride (OCEAN) 0.65 % SOLN nasal spray Place 2 sprays into both nostrils daily as needed for congestion.   Yes [provider]  XARELTO 15 MG TABS tablet TAKE ONE TABLET EVERY DAY with SUPPER 09/22/23  Yes Duke Salvia, MD   DG Knee Right Port  Result Date: 09/26/2023 CLINICAL DATA:  Closed displaced fracture right femoral neck. Fall. Evaluate for right knee fracture. EXAM: PORTABLE RIGHT KNEE - 1-2 VIEW COMPARISON:  Frontal and patellar sunrise views of the right knee 05/10/2020 FINDINGS: Mildly decreased bone mineralization. Mild-to-moderate lateral compartment joint space narrowing with mild peripheral osteophytosis, mildly  advanced from 05/10/2020. Mild patellofemoral joint space narrowing and superior and inferior degenerative osteophytosis. Within the limitations of overlying clothing artifact, no joint effusion is seen. Mild-to-moderate atherosclerotic calcifications. IMPRESSION: 1. No acute fracture. 2. Mild-to-moderate lateral compartment and mild patellofemoral compartment osteoarthritis. Electronically Signed   By: Neita Garnet M.D.   On: 09/26/2023 08:11   DG Chest Port 1 View  Result Date: 09/25/2023 CLINICAL DATA:  89 year old female status post fall, right hip fracture. EXAM: PORTABLE CHEST 1 VIEW COMPARISON:  Chest radiographs 07/29/2022 and earlier. FINDINGS: Portable AP semi upright view at 0417 hours. Chronically tortuous and calcified thoracic aorta. Chronic left chest dual lead cardiac pacemaker. Stable cardiac size and mediastinal contours. Visualized tracheal air column is within normal limits. Allowing for portable technique the lungs are clear. Chronic proximal left humerus ORIF. No acute osseous abnormality identified. IMPRESSION: 1. No acute cardiopulmonary abnormality. 2. Chronic pacemaker, Aortic Atherosclerosis (ICD10-I70.0).  Electronically Signed   By: Odessa Fleming M.D.   On: 09/25/2023 05:27   DG Hip Port Unilat W or Wo Pelvis 1 View Right  Result Date: 09/25/2023 CLINICAL DATA:  89 year old female status post fall on blood thinners. EXAM: DG HIP (WITH OR WITHOUT PELVIS) 1V PORT RIGHT COMPARISON:  CT Abdomen and Pelvis 06/23/2015. FINDINGS: Portable AP supine view at 0420 hours. Acute right femoral neck fracture with impaction, minor varus angulation. On all three views the intertrochanteric segment of the proximal right femur appears to remain intact. Right femoral head remains normally located. No superimposed fracture of the pelvis identified. Left femoral head appears normally located in grossly intact. Nonobstructed visible bowel gas pattern. IMPRESSION: Acute right femoral neck fracture with varus impaction. Electronically Signed   By: Odessa Fleming M.D.   On: 09/25/2023 05:26    Positive ROS: All other systems have been reviewed and were otherwise negative with the exception of those mentioned in the HPI and as above.  Physical Exam: General: Alert, no acute distress. Cardiovascular: No pedal edema Respiratory: No cyanosis, no use of accessory musculature GI: No organomegaly, abdomen is soft and non-tender Skin: No lesions in the area of chief complaint Neurologic: Sensation intact distally Psychiatric: Patient is competent for consent with normal mood and affect Lymphatic: No axillary or cervical lymphadenopathy  MUSCULOSKELETAL: Examination of the right hip reveals no skin wounds or lesions. RLE shortened and externally rotated. Pain with movement RLE.   Distal pedal pulses 2+ bilaterally. No significant pedal edema. Sensory and motor function intact in LE bilaterally including plantar flexion, dorsiflexion, and EHL.   Assessment: Displaced right femoral neck fracture.   Plan: I discussed the findings with the patient. She has an unstable right femoral neck fracture. She will require surgical treatment for pain  control and allow immediate mobilization out of bed. She is already admitted to [facility]. Discussed total hip arthroplasty vs hemiarthroplasty. Discussed R/B/A. See risk statement. Plan for right hip hemiarthroplasty tomorrow morning due to continued xarelto washout. Patient to remain NPO after midnight. Continue to hold anticoagulation.     Clois Dupes, PA-C    09/26/2023 4:49 PM

## 2023-09-26 NOTE — Plan of Care (Signed)
  Problem: Education: Goal: Knowledge of General Education information will improve Description: Including pain rating scale, medication(s)/side effects and non-pharmacologic comfort measures 09/26/2023 1649 by Genevie Ann, RN Outcome: Progressing 09/26/2023 1631 by Genevie Ann, RN Outcome: Progressing   Problem: Health Behavior/Discharge Planning: Goal: Ability to manage health-related needs will improve Outcome: Progressing

## 2023-09-26 NOTE — Progress Notes (Signed)
PROGRESS NOTE    Yvonne [name]  ZOX:[id number] DOB: [birth year] DOA: 09/25/2023 PCP: Thana Ates, MD    Brief Narrative:  Yvonne [name] is a 89 y.o. female with medical history significant of paroxysmal atrial fibrillation on chronic anticoagulation, congestive heart failure, complete heart block s/p Saint Jude pacemaker, GI bleed, GERD, and arthritis who presents after having a fall.  At baseline she has been living independently in an assisted living facility called [facility] for the last 7 years. The incident occurred when the patient was moving from the bedroom to the hall, intending to take bottled water to the kitchen when she fell.  She was using her rollator at the time. As a result of the fall, the patient is experiencing severe pain in the hip area    Assessment and Plan: Right femoral neck fracture secondary to fall Acute.  Patient normally ambulates with the use of a rollator and had a mechanical fall landing on her right side.  Found to have a right femoral neck fracture with varus impaction. -Bedrest -Hydrocodone/Dilaudid IV as needed for moderate severe pain respectively -Orthopedics consulted- plan for surgery in the AM   Complete heart block s/p PM Paroxysmal atrial fibrillation on chronic anticoagulation Patient appears to be rate controlled at this time.  Last dose of Xarelto was yesterday evening around 6 PM.  Patient has a [employer] dual pacemaker implanted in 08/2017 for intermittent complete heart block. -Hold Xarelto -Continue Coreg and amiodarone   Heart failure with reduced EF  Chronic.  Patient appears to be euvolemic on physical exam at this time.  Last echocardiogram noted EF to be 40 to 45% with grade 1 diastolic dysfunction back in 2017.CHA2DS2-VASc score equal to at least 4. -Strict I&Os and daily weights -Continue Coreg and furosemide -Hold Xarelto- resume post op   Essential hypertension Blood pressures currently maintained. -Continue home blood  pressure regimen   Normocytic anemia Hemoglobin 10.9 which appears similar to prior check on 11/13. -trend   Anxiety -Continue Xanax as needed and Wellbutrin   Urinary urgency -Check urinalysis   GERD -Continue Protonix   DVT prophylaxis:     Code Status: Full Code   Disposition Plan:  Level of care: Telemetry Surgical Status is: Inpatient Remains inpatient appropriate    Consultants:  ortho  Subjective: No SOB, no CP  Objective: Vitals:   09/25/23 2000 09/26/23 0011 09/26/23 0429 09/26/23 0804  BP: 123/67 (!) 101/59 117/75 130/75  Pulse: 90 85 88 95  Resp: 18 17 18 18   Temp: 98.2 F (36.8 C) 98.1 F (36.7 C) 98.5 F (36.9 C) 97.9 F (36.6 C)  TempSrc: Oral Oral Oral Oral  SpO2: 95% 95% 93% 92%  Weight:      Height:        Intake/Output Summary (Last 24 hours) at 09/26/2023 1302 Last data filed at 09/25/2023 2105 Gross per 24 hour  Intake 480 ml  Output 1800 ml  Net -1320 ml   Filed Weights   09/25/23 0447  Weight: 65.2 kg    Examination:   General: Appearance:    Well developed, well nourished female in no acute distress     Lungs:     respirations unlabored  Heart:    Normal heart rate.          Neurologic:   Awake, alert, oriented x 3. No apparent focal neurological           defect.        Data  Reviewed: I have personally reviewed following labs and imaging studies  CBC: Recent Labs  Lab 09/25/23 0438 09/26/23 0612  WBC 7.7 11.3*  NEUTROABS 5.6  --   HGB 10.9* 10.6*  HCT 34.9* 33.6*  MCV 93.8 90.3  PLT 271 248   Basic Metabolic Panel: Recent Labs  Lab 09/25/23 0438 09/26/23 0612  NA 139 135  K 3.7 3.5  CL 110 100  CO2 23 25  GLUCOSE 100* 92  BUN 35* 26*  CREATININE 1.06* 1.11*  CALCIUM 8.9 9.1  MG 2.6*  --    GFR: Estimated Creatinine Clearance: 31.4 mL/min (A) (by C-G formula based on SCr of 1.11 mg/dL (H)). Liver Function Tests: Recent Labs  Lab 09/25/23 0438  AST 26  ALT 20  ALKPHOS 78  BILITOT  0.3  PROT 6.6  ALBUMIN 3.7   No results for input(s): "LIPASE", "AMYLASE" in the last 168 hours. No results for input(s): "AMMONIA" in the last 168 hours. Coagulation Profile: Recent Labs  Lab 09/25/23 0438  INR 2.0*   Cardiac Enzymes: No results for input(s): "CKTOTAL", "CKMB", "CKMBINDEX", "TROPONINI" in the last 168 hours. BNP (last 3 results) No results for input(s): "PROBNP" in the last 8760 hours. HbA1C: No results for input(s): "HGBA1C" in the last 72 hours. CBG: No results for input(s): "GLUCAP" in the last 168 hours. Lipid Profile: No results for input(s): "CHOL", "HDL", "LDLCALC", "TRIG", "CHOLHDL", "LDLDIRECT" in the last 72 hours. Thyroid Function Tests: No results for input(s): "TSH", "T4TOTAL", "FREET4", "T3FREE", "THYROIDAB" in the last 72 hours. Anemia Panel: No results for input(s): "VITAMINB12", "FOLATE", "FERRITIN", "TIBC", "IRON", "RETICCTPCT" in the last 72 hours. Sepsis Labs: No results for input(s): "PROCALCITON", "LATICACIDVEN" in the last 168 hours.  No results found for this or any previous visit (from the past 240 hour(s)).       Radiology Studies: DG Knee Right Port  Result Date: 09/26/2023 CLINICAL DATA:  Closed displaced fracture right femoral neck. Fall. Evaluate for right knee fracture. EXAM: PORTABLE RIGHT KNEE - 1-2 VIEW COMPARISON:  Frontal and patellar sunrise views of the right knee 05/10/2020 FINDINGS: Mildly decreased bone mineralization. Mild-to-moderate lateral compartment joint space narrowing with mild peripheral osteophytosis, mildly advanced from 05/10/2020. Mild patellofemoral joint space narrowing and superior and inferior degenerative osteophytosis. Within the limitations of overlying clothing artifact, no joint effusion is seen. Mild-to-moderate atherosclerotic calcifications. IMPRESSION: 1. No acute fracture. 2. Mild-to-moderate lateral compartment and mild patellofemoral compartment osteoarthritis. Electronically Signed   By:  Neita Garnet M.D.   On: 09/26/2023 08:11   DG Chest Port 1 View  Result Date: 09/25/2023 CLINICAL DATA:  89 year old female status post fall, right hip fracture. EXAM: PORTABLE CHEST 1 VIEW COMPARISON:  Chest radiographs 07/29/2022 and earlier. FINDINGS: Portable AP semi upright view at 0417 hours. Chronically tortuous and calcified thoracic aorta. Chronic left chest dual lead cardiac pacemaker. Stable cardiac size and mediastinal contours. Visualized tracheal air column is within normal limits. Allowing for portable technique the lungs are clear. Chronic proximal left humerus ORIF. No acute osseous abnormality identified. IMPRESSION: 1. No acute cardiopulmonary abnormality. 2. Chronic pacemaker, Aortic Atherosclerosis (ICD10-I70.0). Electronically Signed   By: Odessa Fleming M.D.   On: 09/25/2023 05:27   DG Hip Port Unilat W or Wo Pelvis 1 View Right  Result Date: 09/25/2023 CLINICAL DATA:  89 year old female status post fall on blood thinners. EXAM: DG HIP (WITH OR WITHOUT PELVIS) 1V PORT RIGHT COMPARISON:  CT Abdomen and Pelvis 06/23/2015. FINDINGS: Portable AP supine view at 0420  hours. Acute right femoral neck fracture with impaction, minor varus angulation. On all three views the intertrochanteric segment of the proximal right femur appears to remain intact. Right femoral head remains normally located. No superimposed fracture of the pelvis identified. Left femoral head appears normally located in grossly intact. Nonobstructed visible bowel gas pattern. IMPRESSION: Acute right femoral neck fracture with varus impaction. Electronically Signed   By: Odessa Fleming M.D.   On: 09/25/2023 05:26        Scheduled Meds:  amiodarone  100 mg Oral Daily   buPROPion  300 mg Oral Daily   carvedilol  12.5 mg Oral BID   fluticasone  2 spray Each Nare Daily   furosemide  40 mg Oral Daily   mirtazapine  7.5 mg Oral QHS   pantoprazole  40 mg Oral q morning   potassium chloride  10 mEq Oral BID   Continuous  Infusions:   LOS: 1 day    Time spent: 45 minutes spent on chart review, discussion with nursing staff, consultants, updating family and interview/physical exam; more than 50% of that time was spent in counseling and/or coordination of care.    Joseph Art, DO [facility] Available via Epic secure chat 7am-7pm After these hours, please refer to coverage provider listed on [email or web address] 09/26/2023, 1:02 PM

## 2023-09-26 NOTE — Consult Note (Signed)
ORTHOPAEDIC CONSULTATION  REQUESTING PHYSICIAN: Joseph Art, DO  PCP:  Thana Ates, MD  Chief Complaint: Right hip pain.   HPI: Yvonne [name] is a 89 y.o. female female with medical history significant of paroxysmal atrial fibrillation on chronic anticoagulation, congestive heart failure, complete heart block s/p Saint Jude pacemaker, GI bleed, GERD, and arthritis who presents after having a fall.  At baseline she has been living independently in an assisted living facility, [facility], for the last 7 years. Patient had a mechanical ground level fall, using her rollator. Right hip pain and inability to bear weight. X-rays showed right femoral neck fracture. Orthopedics consulted.   Patient has limited daily activity. Uses a rollator at baseline. She takes xarelto per baseline.   Past Medical History:  Diagnosis Date   Anxiety state, unspecified    Arthritis    Cancer (HCC)    basil cell carcinomas removed   Cataract    Complete heart block (HCC)    a. s/p St. Jude PPM 09/26/17.   Esophageal reflux    Hemorrhage of gastrointestinal tract, unspecified    a. occurred on Aleve   History of peptic ulcer    LBBB (left bundle branch block)    Left ventricular systolic dysfunction    hx of mild   Mitral valve insufficiency and aortic valve insufficiency    a. Remote echo 2010: mild focal basal hypertrophy of septum, EF 50-55%, mid-distal inferoseptal myocardium, grade 1 DD, LBBB, mild AI/MR, PASP mildly increased.   PAF (paroxysmal atrial fibrillation) (HCC)    Paroxysmal supraventricular tachycardia (HCC)    Wears glasses    Past Surgical History:  Procedure Laterality Date   ABDOMINAL HYSTERECTOMY     APPENDECTOMY     BREAST SURGERY     biopsy   CATARACT EXTRACTION Left    ESOPHAGOGASTRODUODENOSCOPY     ESOPHAGOGASTRODUODENOSCOPY (EGD) WITH PROPOFOL N/A 10/10/2015   Procedure: ESOPHAGOGASTRODUODENOSCOPY (EGD) WITH PROPOFOL;  Surgeon: Charolett Bumpers, MD;  Location:  [facility];  Service: Endoscopy;  Laterality: N/A;   FOOT SURGERY Left    hammer toe and bunion   KNEE ARTHROSCOPY  2009   left   OPEN REDUCTION INTERNAL FIXATION (ORIF) METACARPAL Left 11/05/2013   Procedure: CLOSED REDUCTION/PINNING VS OPEN REDUCTION INTERNAL FIXATION (ORIF) LEFT SMALL  METACARPAL FRACTURE ;  Surgeon: Wyn Forster., MD;  Location: [facility];  Service: Orthopedics;  Laterality: Left;   ORIF HUMERUS FRACTURE Left 04/10/2015   Procedure: OPEN REDUCTION INTERNAL FIXATION (ORIF) PROXIMAL HUMERUS FRACTURE;  Surgeon: Eldred Manges, MD;  Location: [facility] OR;  Service: Orthopedics;  Laterality: Left;   PACEMAKER IMPLANT N/A 09/26/2017   Procedure: PACEMAKER IMPLANT;  Surgeon: Regan Lemming, MD;  Location: [facility];  Service: Cardiovascular;  Laterality: N/A;   TONSILLECTOMY     TUBAL LIGATION     Social History   Socioeconomic History   Marital status: Married    Spouse name: Not on file   Number of children: Not on file   Years of education: Not on file   Highest education level: Not on file  Occupational History   Not on file  Tobacco Use   Smoking status: Never   Smokeless tobacco: Never  Substance and Sexual Activity   Alcohol use: No   Drug use: No   Sexual activity: Not on file  Other Topics Concern   Not on file  Social History Narrative   Not on file  Social Determinants of Health   Financial Resource Strain: Not on file  Food Insecurity: No Food Insecurity (09/25/2023)   Hunger Vital Sign    Worried About Running Out of Food in the Last Year: Never true    Ran Out of Food in the Last Year: Never true  Transportation Needs: No Transportation Needs (09/25/2023)   PRAPARE - Administrator, Civil Service (Medical): No    Lack of Transportation (Non-Medical): No  Physical Activity: Not on file  Stress: Not on file  Social Connections: Not on file   Family History  Problem Relation Age of Onset    Hypertension Mother    Heart failure Father    Stroke Sister    Heart attack Neg Hx    Allergies  Allergen Reactions   Aleve [Naproxen Sodium] Other (See Comments)    Gi bleed   Ambien [Zolpidem] Other (See Comments)    Pt states "made me have crazy thoughts"   Flexeril [Cyclobenzaprine] Other (See Comments)    Urinary rentention   Adhesive [Tape] Rash and Other (See Comments)    Skin redness   Aspirin Other (See Comments)    Told to avoid high doses due to history of stomach ulcer   Prior to Admission medications   Medication Sig Start Date End Date Taking? Authorizing Provider  acetaminophen (TYLENOL) 500 MG tablet Take 500-1,000 mg by mouth every 6 (six) hours as needed (for pain).   Yes [provider]  ALPRAZolam (XANAX) 0.25 MG tablet Take 0.25 mg by mouth 2 (two) times daily as needed.   Yes [provider]  amiodarone (PACERONE) 200 MG tablet TAKE ONE-HALF TABLET DAILY 09/15/23  Yes Duke Salvia, MD  buPROPion (WELLBUTRIN XL) 300 MG 24 hr tablet Take 1 tablet by mouth daily. 04/12/19  Yes [provider]  carvedilol (COREG) 12.5 MG tablet Take 1 tablet (12.5 mg total) by mouth 2 (two) times daily. 09/08/23  Yes Duke Salvia, MD  fluticasone [facility]) 50 MCG/ACT nasal spray Place 2 sprays into both nostrils daily. 2 sprays each nostril at night 07/20/21  Yes Drema Halon, MD  furosemide (LASIX) 20 MG tablet TAKE TWO TABLETS BY MOUTH DAILY 09/22/23  Yes Duke Salvia, MD  mirtazapine (REMERON) 7.5 MG tablet Take 7.5 mg by mouth at bedtime. 08/25/23  Yes [provider]  ondansetron (ZOFRAN) 8 MG tablet Take 8 mg by mouth every 8 (eight) hours as needed for nausea or vomiting.   Yes [provider]  pantoprazole (PROTONIX) 40 MG tablet Take 40 mg by mouth every morning. 08/25/23  Yes [provider]  potassium chloride (K-DUR) 10 MEQ tablet Take 10 mEq by mouth 2 (two) times daily. 11/04/17  Yes [provider]  Propylene Glycol (SYSTANE COMPLETE OP) Apply 1 drop to eye as needed.   Yes [provider]  sodium chloride (OCEAN) 0.65 % SOLN nasal spray Place 2 sprays into both nostrils daily as needed for congestion.   Yes [provider]  XARELTO 15 MG TABS tablet TAKE ONE TABLET EVERY DAY with SUPPER 09/22/23  Yes Duke Salvia, MD   DG Knee Right Port  Result Date: 09/26/2023 CLINICAL DATA:  Closed displaced fracture right femoral neck. Fall. Evaluate for right knee fracture. EXAM: PORTABLE RIGHT KNEE - 1-2 VIEW COMPARISON:  Frontal and patellar sunrise views of the right knee 05/10/2020 FINDINGS: Mildly decreased bone mineralization. Mild-to-moderate lateral compartment joint space narrowing with mild peripheral osteophytosis, mildly  advanced from 05/10/2020. Mild patellofemoral joint space narrowing and superior and inferior degenerative osteophytosis. Within the limitations of overlying clothing artifact, no joint effusion is seen. Mild-to-moderate atherosclerotic calcifications. IMPRESSION: 1. No acute fracture. 2. Mild-to-moderate lateral compartment and mild patellofemoral compartment osteoarthritis. Electronically Signed   By: Neita Garnet M.D.   On: 09/26/2023 08:11   DG Chest Port 1 View  Result Date: 09/25/2023 CLINICAL DATA:  89 year old female status post fall, right hip fracture. EXAM: PORTABLE CHEST 1 VIEW COMPARISON:  Chest radiographs 07/29/2022 and earlier. FINDINGS: Portable AP semi upright view at 0417 hours. Chronically tortuous and calcified thoracic aorta. Chronic left chest dual lead cardiac pacemaker. Stable cardiac size and mediastinal contours. Visualized tracheal air column is within normal limits. Allowing for portable technique the lungs are clear. Chronic proximal left humerus ORIF. No acute osseous abnormality identified. IMPRESSION: 1. No acute cardiopulmonary abnormality. 2. Chronic pacemaker, Aortic Atherosclerosis (ICD10-I70.0).  Electronically Signed   By: Odessa Fleming M.D.   On: 09/25/2023 05:27   DG Hip Port Unilat W or Wo Pelvis 1 View Right  Result Date: 09/25/2023 CLINICAL DATA:  89 year old female status post fall on blood thinners. EXAM: DG HIP (WITH OR WITHOUT PELVIS) 1V PORT RIGHT COMPARISON:  CT Abdomen and Pelvis 06/23/2015. FINDINGS: Portable AP supine view at 0420 hours. Acute right femoral neck fracture with impaction, minor varus angulation. On all three views the intertrochanteric segment of the proximal right femur appears to remain intact. Right femoral head remains normally located. No superimposed fracture of the pelvis identified. Left femoral head appears normally located in grossly intact. Nonobstructed visible bowel gas pattern. IMPRESSION: Acute right femoral neck fracture with varus impaction. Electronically Signed   By: Odessa Fleming M.D.   On: 09/25/2023 05:26    Positive ROS: All other systems have been reviewed and were otherwise negative with the exception of those mentioned in the HPI and as above.  Physical Exam: General: Alert, no acute distress. Cardiovascular: No pedal edema Respiratory: No cyanosis, no use of accessory musculature GI: No organomegaly, abdomen is soft and non-tender Skin: No lesions in the area of chief complaint Neurologic: Sensation intact distally Psychiatric: Patient is competent for consent with normal mood and affect Lymphatic: No axillary or cervical lymphadenopathy  MUSCULOSKELETAL: Examination of the right hip reveals no skin wounds or lesions. RLE shortened and externally rotated. Pain with movement RLE.   Distal pedal pulses 2+ bilaterally. No significant pedal edema. Sensory and motor function intact in LE bilaterally including plantar flexion, dorsiflexion, and EHL.   Assessment: Displaced right femoral neck fracture.   Plan: I discussed the findings with the patient. She has an unstable right femoral neck fracture. She will require surgical treatment for pain  control and allow immediate mobilization out of bed. She is already admitted to [facility]. Discussed total hip arthroplasty vs hemiarthroplasty. Discussed R/B/A. See risk statement. Plan for right hip hemiarthroplasty tomorrow morning due to continued xarelto washout. Patient to remain NPO after midnight. Continue to hold anticoagulation.     Clois Dupes, PA-C    09/26/2023 4:49 PM

## 2023-09-27 ENCOUNTER — Inpatient Hospital Stay (HOSPITAL_COMMUNITY): Payer: PPO | Admitting: Certified Registered"

## 2023-09-27 ENCOUNTER — Inpatient Hospital Stay (HOSPITAL_COMMUNITY): Payer: PPO

## 2023-09-27 ENCOUNTER — Encounter (HOSPITAL_COMMUNITY): Payer: Self-pay | Admitting: Internal Medicine

## 2023-09-27 ENCOUNTER — Other Ambulatory Visit: Payer: Self-pay

## 2023-09-27 ENCOUNTER — Encounter (HOSPITAL_COMMUNITY)
Admission: EM | Disposition: A | Discharge status: Skilled Nursing Facility | Payer: Self-pay | Source: Skilled Nursing Facility | Attending: Internal Medicine

## 2023-09-27 DIAGNOSIS — S72001A Fracture of unspecified part of neck of right femur, initial encounter for closed fracture: Secondary | ICD-10-CM

## 2023-09-27 DIAGNOSIS — I4891 Unspecified atrial fibrillation: Secondary | ICD-10-CM | POA: Diagnosis not present

## 2023-09-27 HISTORY — PX: ANTERIOR APPROACH HEMI HIP ARTHROPLASTY: SHX6690

## 2023-09-27 LAB — CBC
HCT: 35 % — ABNORMAL LOW (ref 36.0–46.0)
Hemoglobin: 11.2 g/dL — ABNORMAL LOW (ref 12.0–15.0)
MCH: 28.9 pg (ref 26.0–34.0)
MCHC: 32 g/dL (ref 30.0–36.0)
MCV: 90.2 fL (ref 80.0–100.0)
Platelets: 258 10*3/uL (ref 150–400)
RBC: 3.88 MIL/uL (ref 3.87–5.11)
RDW: 14.9 % (ref 11.5–15.5)
WBC: 11.9 10*3/uL — ABNORMAL HIGH (ref 4.0–10.5)
nRBC: 0 % (ref 0.0–0.2)

## 2023-09-27 LAB — SURGICAL PCR SCREEN
MRSA, PCR: NEGATIVE
Staphylococcus aureus: NEGATIVE

## 2023-09-27 LAB — BASIC METABOLIC PANEL
Anion gap: 9 (ref 5–15)
BUN: 28 mg/dL — ABNORMAL HIGH (ref 8–23)
CO2: 25 mmol/L (ref 22–32)
Calcium: 9 mg/dL (ref 8.9–10.3)
Chloride: 102 mmol/L (ref 98–111)
Creatinine, Ser: 1.18 mg/dL — ABNORMAL HIGH (ref 0.44–1.00)
GFR, Estimated: 43 mL/min — ABNORMAL LOW (ref >60)
Glucose, Bld: 120 mg/dL — ABNORMAL HIGH (ref 70–99)
Potassium: 3.8 mmol/L (ref 3.5–5.1)
Sodium: 136 mmol/L (ref 135–145)

## 2023-09-27 SURGERY — HEMIARTHROPLASTY, HIP, DIRECT ANTERIOR APPROACH, FOR FRACTURE
Anesthesia: General | Site: Hip | Laterality: Right

## 2023-09-27 MED ORDER — SUGAMMADEX SODIUM 200 MG/2ML IV SOLN
INTRAVENOUS | Status: DC | PRN
  Administered 2023-09-27: 150 mg via INTRAVENOUS

## 2023-09-27 MED ORDER — PROPOFOL 10 MG/ML IV BOLUS
INTRAVENOUS | Status: AC
Start: 2023-09-27 — End: ?
  Filled 2023-09-27: qty 20

## 2023-09-27 MED ORDER — CHLORHEXIDINE GLUCONATE 0.12 % MT SOLN: 15.0000 mL | Freq: Once | OROMUCOSAL | Status: AC

## 2023-09-27 MED ORDER — POLYETHYLENE GLYCOL 3350 17 G PO PACK: 17.0000 g | PACK | Freq: Every day | ORAL | Status: DC | PRN

## 2023-09-27 MED ORDER — VASOPRESSIN 20 UNIT/ML IV SOLN
INTRAVENOUS | Status: AC
  Filled 2023-09-27: qty 1

## 2023-09-27 MED ORDER — BUPIVACAINE-EPINEPHRINE (PF) 0.25% -1:200000 IJ SOLN
INTRAMUSCULAR | Status: AC
  Filled 2023-09-27: qty 30

## 2023-09-27 MED ORDER — METOCLOPRAMIDE HCL 5 MG/ML IJ SOLN: 5.0000 mg | Freq: Three times a day (TID) | INTRAMUSCULAR | Status: DC | PRN

## 2023-09-27 MED ORDER — PHENYLEPHRINE HCL-NACL 20-0.9 MG/250ML-% IV SOLN
INTRAVENOUS | Status: DC | PRN
  Administered 2023-09-27: 50 ug/min via INTRAVENOUS

## 2023-09-27 MED ORDER — VASOPRESSIN 20 UNIT/ML IV SOLN
INTRAVENOUS | Status: DC | PRN
  Administered 2023-09-27 (×2): 1 [IU] via INTRAVENOUS

## 2023-09-27 MED ORDER — PHENYLEPHRINE 80 MCG/ML (10ML) SYRINGE FOR IV PUSH (FOR BLOOD PRESSURE SUPPORT)
PREFILLED_SYRINGE | INTRAVENOUS | Status: AC
  Filled 2023-09-27: qty 10

## 2023-09-27 MED ORDER — RIVAROXABAN 10 MG PO TABS
10.0000 mg | ORAL_TABLET | Freq: Every day | ORAL | Status: DC
  Administered 2023-09-28: 10 mg via ORAL
  Filled 2023-09-27: qty 1

## 2023-09-27 MED ORDER — METOCLOPRAMIDE HCL 5 MG PO TABS: 5.0000 mg | ORAL_TABLET | Freq: Three times a day (TID) | ORAL | Status: DC | PRN

## 2023-09-27 MED ORDER — TRANEXAMIC ACID-NACL 1000-0.7 MG/100ML-% IV SOLN
INTRAVENOUS | Status: AC
  Filled 2023-09-27: qty 100

## 2023-09-27 MED ORDER — KETOROLAC TROMETHAMINE 30 MG/ML IJ SOLN
INTRAMUSCULAR | Status: AC
  Filled 2023-09-27: qty 1

## 2023-09-27 MED ORDER — ALBUMIN HUMAN 5 % IV SOLN: INTRAVENOUS | Status: DC | PRN

## 2023-09-27 MED ORDER — MENTHOL 3 MG MT LOZG: 1.0000 | LOZENGE | OROMUCOSAL | Status: DC | PRN

## 2023-09-27 MED ORDER — SODIUM CHLORIDE (PF) 0.9 % IJ SOLN
INTRAMUSCULAR | Status: DC | PRN
  Administered 2023-09-27: 60 mL

## 2023-09-27 MED ORDER — EPHEDRINE 5 MG/ML INJ
INTRAVENOUS | Status: AC
  Filled 2023-09-27: qty 5

## 2023-09-27 MED ORDER — ONDANSETRON HCL 4 MG/2ML IJ SOLN
4.0000 mg | Freq: Four times a day (QID) | INTRAMUSCULAR | Status: DC | PRN
  Administered 2023-09-29: 4 mg via INTRAVENOUS
  Filled 2023-09-27: qty 2

## 2023-09-27 MED ORDER — METHOCARBAMOL 1000 MG/10ML IJ SOLN: 500.0000 mg | Freq: Four times a day (QID) | INTRAMUSCULAR | Status: DC | PRN

## 2023-09-27 MED ORDER — FENTANYL CITRATE (PF) 100 MCG/2ML IJ SOLN
INTRAMUSCULAR | Status: AC
  Filled 2023-09-27: qty 2

## 2023-09-27 MED ORDER — ONDANSETRON HCL 4 MG/2ML IJ SOLN
INTRAMUSCULAR | Status: DC | PRN
  Administered 2023-09-27: 4 mg via INTRAVENOUS

## 2023-09-27 MED ORDER — PHENYLEPHRINE 80 MCG/ML (10ML) SYRINGE FOR IV PUSH (FOR BLOOD PRESSURE SUPPORT)
PREFILLED_SYRINGE | INTRAVENOUS | Status: DC | PRN
  Administered 2023-09-27 (×3): 160 ug via INTRAVENOUS
  Administered 2023-09-27: 320 ug via INTRAVENOUS

## 2023-09-27 MED ORDER — ORAL CARE MOUTH RINSE: 15.0000 mL | Freq: Once | OROMUCOSAL | Status: AC

## 2023-09-27 MED ORDER — SODIUM CHLORIDE 0.9 % IR SOLN
Status: DC | PRN
  Administered 2023-09-27: 3000 mL

## 2023-09-27 MED ORDER — LACTATED RINGERS IV SOLN: INTRAVENOUS | Status: DC

## 2023-09-27 MED ORDER — ACETAMINOPHEN 500 MG PO TABS
ORAL_TABLET | ORAL | Status: AC
  Administered 2023-09-27: 1000 mg via ORAL
  Filled 2023-09-27: qty 2

## 2023-09-27 MED ORDER — FENTANYL CITRATE (PF) 100 MCG/2ML IJ SOLN: 25.0000 ug | INTRAMUSCULAR | Status: DC | PRN

## 2023-09-27 MED ORDER — DEXAMETHASONE SODIUM PHOSPHATE 10 MG/ML IJ SOLN
INTRAMUSCULAR | Status: AC
  Filled 2023-09-27: qty 1

## 2023-09-27 MED ORDER — CHLORHEXIDINE GLUCONATE 0.12 % MT SOLN
OROMUCOSAL | Status: AC
  Administered 2023-09-27: 15 mL via OROMUCOSAL
  Filled 2023-09-27: qty 15

## 2023-09-27 MED ORDER — METHOCARBAMOL 500 MG PO TABS
500.0000 mg | ORAL_TABLET | Freq: Four times a day (QID) | ORAL | Status: DC | PRN
  Administered 2023-09-28 – 2023-09-29 (×2): 500 mg via ORAL
  Filled 2023-09-27 (×2): qty 1

## 2023-09-27 MED ORDER — FENTANYL CITRATE (PF) 250 MCG/5ML IJ SOLN
INTRAMUSCULAR | Status: DC | PRN
  Administered 2023-09-27: 50 ug via INTRAVENOUS

## 2023-09-27 MED ORDER — PRONTOSAN WOUND IRRIGATION OPTIME
TOPICAL | Status: DC | PRN
  Administered 2023-09-27: 1

## 2023-09-27 MED ORDER — PHENOL 1.4 % MT LIQD: 1.0000 | OROMUCOSAL | Status: DC | PRN

## 2023-09-27 MED ORDER — DOCUSATE SODIUM 100 MG PO CAPS
100.0000 mg | ORAL_CAPSULE | Freq: Two times a day (BID) | ORAL | Status: DC
  Administered 2023-09-27 – 2023-09-30 (×7): 100 mg via ORAL
  Filled 2023-09-27 (×7): qty 1

## 2023-09-27 MED ORDER — ROCURONIUM BROMIDE 10 MG/ML (PF) SYRINGE
PREFILLED_SYRINGE | INTRAVENOUS | Status: DC | PRN
  Administered 2023-09-27: 40 mg via INTRAVENOUS

## 2023-09-27 MED ORDER — ROCURONIUM BROMIDE 10 MG/ML (PF) SYRINGE
PREFILLED_SYRINGE | INTRAVENOUS | Status: AC
  Filled 2023-09-27: qty 10

## 2023-09-27 MED ORDER — ONDANSETRON HCL 4 MG PO TABS
4.0000 mg | ORAL_TABLET | Freq: Four times a day (QID) | ORAL | Status: DC | PRN
  Administered 2023-09-27: 4 mg via ORAL
  Filled 2023-09-27: qty 1

## 2023-09-27 MED ORDER — ONDANSETRON HCL 4 MG/2ML IJ SOLN
INTRAMUSCULAR | Status: AC
  Filled 2023-09-27: qty 2

## 2023-09-27 MED ORDER — AMISULPRIDE (ANTIEMETIC) 5 MG/2ML IV SOLN: 10.0000 mg | Freq: Once | INTRAVENOUS | Status: DC | PRN

## 2023-09-27 MED ORDER — SENNA 8.6 MG PO TABS
1.0000 | ORAL_TABLET | Freq: Two times a day (BID) | ORAL | Status: DC
  Administered 2023-09-27 – 2023-09-30 (×7): 8.6 mg via ORAL
  Filled 2023-09-27 (×7): qty 1

## 2023-09-27 MED ORDER — CEFAZOLIN SODIUM-DEXTROSE 2-4 GM/100ML-% IV SOLN
INTRAVENOUS | Status: AC
  Filled 2023-09-27: qty 100

## 2023-09-27 MED ORDER — PROPOFOL 10 MG/ML IV BOLUS
INTRAVENOUS | Status: DC | PRN
  Administered 2023-09-27: 60 mg via INTRAVENOUS

## 2023-09-27 MED ORDER — LIDOCAINE 2% (20 MG/ML) 5 ML SYRINGE
INTRAMUSCULAR | Status: DC | PRN
  Administered 2023-09-27: 40 mg via INTRAVENOUS

## 2023-09-27 MED ORDER — LIDOCAINE 2% (20 MG/ML) 5 ML SYRINGE
INTRAMUSCULAR | Status: AC
  Filled 2023-09-27: qty 5

## 2023-09-27 MED ORDER — PROPOFOL 500 MG/50ML IV EMUL
INTRAVENOUS | Status: DC | PRN
  Administered 2023-09-27: 50 ug/kg/min via INTRAVENOUS

## 2023-09-27 MED ORDER — DEXAMETHASONE SODIUM PHOSPHATE 10 MG/ML IJ SOLN
INTRAMUSCULAR | Status: DC | PRN
  Administered 2023-09-27: 5 mg via INTRAVENOUS

## 2023-09-27 MED ORDER — 0.9 % SODIUM CHLORIDE (POUR BTL) OPTIME
TOPICAL | Status: DC | PRN
  Administered 2023-09-27: 1000 mL

## 2023-09-27 MED ORDER — CEFAZOLIN SODIUM-DEXTROSE 2-4 GM/100ML-% IV SOLN
2.0000 g | Freq: Four times a day (QID) | INTRAVENOUS | Status: AC
  Administered 2023-09-27 (×2): 2 g via INTRAVENOUS
  Filled 2023-09-27 (×2): qty 100

## 2023-09-27 SURGICAL SUPPLY — 53 items
ALCOHOL 70% 16 OZ (MISCELLANEOUS) ×1 IMPLANT
BAG COUNTER SPONGE SURGICOUNT (BAG) ×1 IMPLANT
BLADE CLIPPER SURG (BLADE) IMPLANT
CHLORAPREP W/TINT 26 (MISCELLANEOUS) ×1 IMPLANT
COVER SURGICAL LIGHT HANDLE (MISCELLANEOUS) ×1 IMPLANT
DERMABOND ADVANCED .7 DNX12 (GAUZE/BANDAGES/DRESSINGS) ×2 IMPLANT
DRAPE 3/4 80X56 (DRAPES) ×3 IMPLANT
DRAPE C-ARM 42X72 X-RAY (DRAPES) ×1 IMPLANT
DRAPE STERI IOBAN 125X83 (DRAPES) ×1 IMPLANT
DRAPE U-SHAPE 47X51 STRL (DRAPES) ×3 IMPLANT
DRSG AQUACEL AG ADV 3.5X10 (GAUZE/BANDAGES/DRESSINGS) ×1 IMPLANT
ELECT BLADE 4.0 EZ CLEAN MEGAD (MISCELLANEOUS) ×1
ELECT PENCIL ROCKER SW 15FT (MISCELLANEOUS) ×1 IMPLANT
ELECT REM PT RETURN 9FT ADLT (ELECTROSURGICAL) ×1
ELECTRODE BLDE 4.0 EZ CLN MEGD (MISCELLANEOUS) ×1 IMPLANT
ELECTRODE REM PT RTRN 9FT ADLT (ELECTROSURGICAL) ×1 IMPLANT
EVACUATOR 1/8 PVC DRAIN (DRAIN) IMPLANT
GLOVE BIO SURGEON STRL SZ7.5 (GLOVE) ×1 IMPLANT
GLOVE BIO SURGEON STRL SZ8.5 (GLOVE) ×2 IMPLANT
GLOVE BIOGEL PI IND STRL 7.5 (GLOVE) ×1 IMPLANT
GLOVE BIOGEL PI IND STRL 8.5 (GLOVE) ×1 IMPLANT
GOWN STRL REUS W/ TWL XL LVL3 (GOWN DISPOSABLE) ×1 IMPLANT
GOWN STRL REUS W/TWL 2XL LVL3 (GOWN DISPOSABLE) ×1 IMPLANT
HEAD MOD COCR 28MM HD -3MM NK (Orthopedic Implant) IMPLANT
HOOD PEEL AWAY T7 (MISCELLANEOUS) ×2 IMPLANT
KIT BASIN OR (CUSTOM PROCEDURE TRAY) ×1 IMPLANT
KIT TURNOVER KIT B (KITS) ×1 IMPLANT
MANIFOLD NEPTUNE II (INSTRUMENTS) ×1 IMPLANT
MARKER SKIN DUAL TIP RULER LAB (MISCELLANEOUS) ×2 IMPLANT
NDL SPNL 18GX3.5 QUINCKE PK (NEEDLE) ×1 IMPLANT
NEEDLE SPNL 18GX3.5 QUINCKE PK (NEEDLE) ×1 IMPLANT
NS IRRIG 1000ML POUR BTL (IV SOLUTION) ×1 IMPLANT
PACK ANTERIOR HIP CUSTOM (KITS) ×1 IMPLANT
PAD ARMBOARD 7.5X6 YLW CONV (MISCELLANEOUS) ×2 IMPLANT
RINGBLOC BI POLAR 28X46MM (Orthopedic Implant) ×1 IMPLANT
SAW OSC TIP CART 19.5X105X1.3 (SAW) ×1 IMPLANT
SEALER BIPOLAR AQUA 6.0 (INSTRUMENTS) IMPLANT
SET HNDPC FAN SPRY TIP SCT (DISPOSABLE) ×1 IMPLANT
SHELL RINGBLOC BI POLR 28X46MM (Orthopedic Implant) IMPLANT
SOLUTION PRONTOSAN WOUND 350ML (IRRIGATION / IRRIGATOR) ×1 IMPLANT
STAPLER SKIN PROX WIDE 3.9 (STAPLE) IMPLANT
STEM FEM CMTLS 13 146 (Stem) IMPLANT
SUT MNCRL AB 3-0 PS2 18 (SUTURE) IMPLANT
SUT MON AB 2-0 CT1 36 (SUTURE) ×1 IMPLANT
SUT VIC AB 2-0 CT1 TAPERPNT 27 (SUTURE) ×1 IMPLANT
SUT VLOC 180 0 24IN GS25 (SUTURE) ×1 IMPLANT
SYR 50ML LL SCALE MARK (SYRINGE) ×1 IMPLANT
TOWEL GREEN STERILE (TOWEL DISPOSABLE) ×1 IMPLANT
TOWEL GREEN STERILE FF (TOWEL DISPOSABLE) ×1 IMPLANT
TRAY CATH INTERMITTENT SS 16FR (CATHETERS) IMPLANT
TRAY FOLEY W/BAG SLVR 16FR ST (SET/KITS/TRAYS/PACK) IMPLANT
TUBE SUCTION HIGH CAP CLEAR NV (SUCTIONS) ×1 IMPLANT
WATER STERILE IRR 1000ML POUR (IV SOLUTION) ×3 IMPLANT

## 2023-09-27 NOTE — Interval H&P Note (Signed)
History and Physical Interval Note:  09/27/2023 7:43 AM  Yvonne [name]  has presented today for surgery, with the diagnosis of Right femoral neck fracture.  The various methods of treatment have been discussed with the patient and family. After consideration of risks, benefits and other options for treatment, the patient has consented to  Procedure(s): ANTERIOR APPROACH HEMI HIP ARTHROPLASTY (Right) as a surgical intervention.  The patient's history has been reviewed, patient examined, no change in status, stable for surgery.  I have reviewed the patient's chart and labs.  Questions were answered to the patient's satisfaction.    The risks, benefits, and alternatives were discussed with the patient. There are risks associated with the surgery including, but not limited to, problems with anesthesia (death), infection, instability (giving out of the joint), dislocation, differences in leg length/angulation/rotation, fracture of bones, loosening or failure of implants, hematoma (blood accumulation) which may require surgical drainage, blood clots, pulmonary embolism, nerve injury (foot drop and lateral thigh numbness), and blood vessel injury. The patient understands these risks and elects to proceed.    Iline Oven Amberlea Spagnuolo

## 2023-09-27 NOTE — Anesthesia Preprocedure Evaluation (Addendum)
Anesthesia Evaluation  Patient identified by MRN, date of birth, ID band Patient awake    Reviewed: Allergy & Precautions, NPO status , Patient's Chart, lab work & pertinent test results  Airway Mallampati: II  TM Distance: >3 FB Neck ROM: Full    Dental  (+) Dental Advisory Given   Pulmonary neg pulmonary ROS   breath sounds clear to auscultation       Cardiovascular hypertension, Pt. on medications and Pt. on home beta blockers +CHF  + dysrhythmias Atrial Fibrillation + pacemaker  Rhythm:Regular Rate:Normal     Neuro/Psych negative neurological ROS     GI/Hepatic Neg liver ROS,GERD  ,,  Endo/Other  negative endocrine ROS    Renal/GU negative Renal ROS     Musculoskeletal   Abdominal   Peds  Hematology  (+) Blood dyscrasia (On xarelto- last dose 11/27 pm), anemia   Anesthesia Other Findings   Reproductive/Obstetrics                             Anesthesia Physical Anesthesia Plan  ASA: 4  Anesthesia Plan: General   Post-op Pain Management: Ofirmev IV (intra-op)*   Induction: Intravenous  PONV Risk Score and Plan: 3 and Dexamethasone, Ondansetron and Treatment may vary due to age or medical condition  Airway Management Planned: Oral ETT  Additional Equipment:   Intra-op Plan:   Post-operative Plan: Extubation in OR  Informed Consent: I have reviewed the patients History and Physical, chart, labs and discussed the procedure including the risks, benefits and alternatives for the proposed anesthesia with the patient or authorized representative who has indicated his/her understanding and acceptance.     Dental advisory given  Plan Discussed with: CRNA  Anesthesia Plan Comments:        Anesthesia Quick Evaluation

## 2023-09-27 NOTE — Plan of Care (Signed)
  Problem: Education: Goal: Knowledge of General Education information will improve Description Including pain rating scale, medication(s)/side effects and non-pharmacologic comfort measures Outcome: Progressing   

## 2023-09-27 NOTE — Discharge Instructions (Signed)

## 2023-09-27 NOTE — Anesthesia Postprocedure Evaluation (Signed)
Anesthesia Post Note  Patient: Yvonne [name]  Procedure(s) Performed: ANTERIOR APPROACH HEMI HIP ARTHROPLASTY (Right: Hip)     Patient location during evaluation: PACU Anesthesia Type: General Level of consciousness: awake and alert Pain management: pain level controlled Vital Signs Assessment: post-procedure vital signs reviewed and stable Respiratory status: spontaneous breathing, nonlabored ventilation, respiratory function stable and patient connected to nasal cannula oxygen Cardiovascular status: blood pressure returned to baseline and stable Postop Assessment: no apparent nausea or vomiting Anesthetic complications: no  No notable events documented.  Last Vitals:  Vitals:   09/27/23 1015 09/27/23 1040  BP: (!) 112/57 115/61  Pulse: 70 70  Resp: 20 16  Temp:  (!) 36.4 C  SpO2: 95% 95%    Last Pain:  Vitals:   09/27/23 1040  TempSrc: Oral  PainSc:                  Kennieth Rad

## 2023-09-27 NOTE — Op Note (Signed)
OPERATIVE REPORT  SURGEON: Samson Frederic, MD   ASSISTANT: Clint Bolder, PA-C.  PREOPERATIVE DIAGNOSIS: Displaced Right femoral neck fracture.   POSTOPERATIVE DIAGNOSIS: Displaced Right femoral neck fracture.   PROCEDURE: Right hip hemiarthroplasty, anterior approach.   IMPLANTS: Biomet Taperloc Complete Reduced Distal stem, size 13 x 146 mm, high offset, with a 28 - 3 mm metal head ball and a 46 mm bipolar head ball.  ANESTHESIA:  General  ANTIBIOTICS: 2g ancef.  ESTIMATED BLOOD LOSS:-100 mL    DRAINS: None.  COMPLICATIONS: None   CONDITION: PACU - hemodynamically stable.   BRIEF CLINICAL NOTE: Yvonne [name] is a 89 y.o. female with a displaced Right femoral neck fracture. The patient was admitted to the hospitalist service and underwent perioperative risk stratification and medical optimization. The risks, benefits, and alternatives to hemiarthroplasty were explained, and the patient elected to proceed.  PROCEDURE IN DETAIL: The patient was taken to the operating room and general anesthesia was induced on the hospital bed.  The patient was then positioned on the Hana table.  All bony prominences were well padded.  The hip was prepped and draped in the normal sterile surgical fashion.  A time-out was called verifying side and site of surgery. Antibiotics were given within 60 minutes of beginning the procedure.   Bikini incision was made, and the direct anterior approach to the hip was performed through the Hueter interval.  Superficial dissection was carried out lateral to the ASIS. Lateral femoral circumflex vessels were treated with the Auqumantys. The anterior capsule was exposed and an inverted T capsulotomy was made. Fracture hematoma was encountered and evacuated. The patient was found to have a comminuted Right subcapital femoral neck fracture.  Inferior pubofemoral ligament was released subperiosteally to the lesser trochanter. I freshened the femoral neck cut with a saw.  I  removed the femoral neck fragment.  A corkscrew was placed into the head and the head was removed.  This was passed to the back table and was measured.   Acetabular exposure was achieved.  I examined the articular cartilage which was intact.  The labrum was intact. A 46 mm trial head was placed and found to have excellent fit.   I then gained femoral exposure taking care to protect the abductors and greater trochanter.  The superior capsule was incised longitudinally, staying lateral to the posterior border of the femoral neck. External rotation, extension, and adduction were applied.  A cookie cutter was used to enter the femoral canal, and then the femoral canal finder was used to confirm location.  I then sequentially broached up to a size 13.  Calcar planer was used on the femoral neck remnant.  I placed a high offset neck and a trial bipolar construct. The hip was reduced.  Leg lengths were checked fluoroscopically.  The hip was dislocated and trial components were removed.  I placed the real stem followed by the real bipolar articulation.  A single reduction maneuver was performed and the hip was reduced.  Fluoroscopy was used to confirm component position and leg lengths.  At 90 degrees of external rotation and extension, the hip was stable to an anterior directed force.   The wound was copiously irrigated with Irrisept solution and normal saline using pulse lavage.  Marcaine solution was injected into the periarticular soft tissue.  The wound was closed in layers using #1 Stratafix for the fascia, 2-0 Vicryl for the subcutaneous fat, 2-0 Monocryl for the deep dermal layer, and staples + Dermabond for  the skin.  Once the glue was fully dried, an Aquacell Ag dressing was applied.  The patient was then awakened from anesthesia and transported to the recovery room in stable condition.  Sponge, needle, and instrument counts were correct at the end of the case x2.  The patient tolerated the procedure well  and there were no known complications.  Please note that a surgical assistant was a medical necessity for this procedure to perform it in a safe and expeditious manner. Assistant was necessary to provide appropriate retraction of vital neurovascular structures, to prevent femoral fracture, and to allow for anatomic placement of the prosthesis.

## 2023-09-27 NOTE — Progress Notes (Signed)
Patient to OR

## 2023-09-27 NOTE — Progress Notes (Signed)
PROGRESS NOTE    MANY MARIS  ION:[id number] DOB: [birth year] DOA: 09/25/2023 PCP: Thana Ates, MD    Brief Narrative:  Yvonne [name] is a 89 y.o. female with medical history significant of paroxysmal atrial fibrillation on chronic anticoagulation, congestive heart failure, complete heart block s/p Saint Jude pacemaker, GI bleed, GERD, and arthritis who presents after having a fall.  At baseline she has been living independently in an assisted living facility called [facility] for the last 7 years. The incident occurred when the patient was moving from the bedroom to the hall, intending to take bottled water to the kitchen when she fell.  She was using her rollator at the time. As a result of the fall, the patient is experiencing severe pain in the hip area    Assessment and Plan: Right femoral neck fracture secondary to fall Acute.  Patient normally ambulates with the use of a rollator and had a mechanical fall landing on her right side.  Found to have a right femoral neck fracture with varus impaction. -Bedrest -Hydrocodone/Dilaudid IV as needed for moderate severe pain respectively -Orthopedics consulted- s/p surgery 11/30   Complete heart block s/p PM Paroxysmal atrial fibrillation on chronic anticoagulation Patient appears to be rate controlled at this time.  Last dose of Xarelto was yesterday evening around 6 PM.  Patient has a [employer] dual pacemaker implanted in 08/2017 for intermittent complete heart block. -Hold Xarelto -Continue Coreg and amiodarone   Heart failure with reduced EF  Chronic.  Patient appears to be euvolemic on physical exam at this time.  Last echocardiogram noted EF to be 40 to 45% with grade 1 diastolic dysfunction back in 2017.CHA2DS2-VASc score equal to at least 4. -Strict I&Os and daily weights -Continue Coreg and furosemide -Hold Xarelto- resume post op   Essential hypertension Blood pressures currently maintained. -Continue home blood pressure  regimen   Normocytic anemia Hemoglobin 10.9 which appears similar to prior check on 11/13. -trend   Anxiety -Continue Xanax as needed and Wellbutrin   Urinary urgency -Check urinalysis   GERD -Continue Protonix   DVT prophylaxis: rivaroxaban (XARELTO) tablet 10 mg Start: 09/28/23 1000 SCDs Start: 09/27/23 1140 rivaroxaban (XARELTO) tablet 10 mg    Code Status: Full Code   Disposition Plan:  Level of care: Telemetry Surgical Status is: Inpatient Remains inpatient appropriate    Consultants:  ortho  Subjective: Thirsty after surgery-- no pain  Objective: Vitals:   09/27/23 0954 09/27/23 1000 09/27/23 1015 09/27/23 1040  BP: (!) 95/59 97/85 (!) 112/57 115/61  Pulse: 70 70 70 70  Resp: 20 17 20 16   Temp: 98.5 F (36.9 C)   (!) 97.5 F (36.4 C)  TempSrc:    Oral  SpO2: 94% 96% 95% 95%  Weight:      Height:        Intake/Output Summary (Last 24 hours) at 09/27/2023 1255 Last data filed at 09/27/2023 2440 Gross per 24 hour  Intake 1000 ml  Output 800 ml  Net 200 ml   Filed Weights   09/25/23 0447 09/27/23 0717  Weight: 65.2 kg 65.2 kg    Examination:    General: Appearance:    Well developed, well nourished female in no acute distress     Lungs:     respirations unlabored  Heart:    Normal heart rate.      MS:   All extremities are intact.   Neurologic:   Awake, alert      Data  Reviewed: I have personally reviewed following labs and imaging studies  CBC: Recent Labs  Lab 09/25/23 0438 09/26/23 0612 09/27/23 0336  WBC 7.7 11.3* 11.9*  NEUTROABS 5.6  --   --   HGB 10.9* 10.6* 11.2*  HCT 34.9* 33.6* 35.0*  MCV 93.8 90.3 90.2  PLT 271 248 258   Basic Metabolic Panel: Recent Labs  Lab 09/25/23 0438 09/26/23 0612 09/27/23 0336  NA 139 135 136  K 3.7 3.5 3.8  CL 110 100 102  CO2 23 25 25   GLUCOSE 100* 92 120*  BUN 35* 26* 28*  CREATININE 1.06* 1.11* 1.18*  CALCIUM 8.9 9.1 9.0  MG 2.6*  --   --    GFR: Estimated Creatinine  Clearance: 29.6 mL/min (A) (by C-G formula based on SCr of 1.18 mg/dL (H)). Liver Function Tests: Recent Labs  Lab 09/25/23 0438  AST 26  ALT 20  ALKPHOS 78  BILITOT 0.3  PROT 6.6  ALBUMIN 3.7   No results for input(s): "LIPASE", "AMYLASE" in the last 168 hours. No results for input(s): "AMMONIA" in the last 168 hours. Coagulation Profile: Recent Labs  Lab 09/25/23 0438  INR 2.0*   Cardiac Enzymes: No results for input(s): "CKTOTAL", "CKMB", "CKMBINDEX", "TROPONINI" in the last 168 hours. BNP (last 3 results) No results for input(s): "PROBNP" in the last 8760 hours. HbA1C: No results for input(s): "HGBA1C" in the last 72 hours. CBG: No results for input(s): "GLUCAP" in the last 168 hours. Lipid Profile: No results for input(s): "CHOL", "HDL", "LDLCALC", "TRIG", "CHOLHDL", "LDLDIRECT" in the last 72 hours. Thyroid Function Tests: No results for input(s): "TSH", "T4TOTAL", "FREET4", "T3FREE", "THYROIDAB" in the last 72 hours. Anemia Panel: No results for input(s): "VITAMINB12", "FOLATE", "FERRITIN", "TIBC", "IRON", "RETICCTPCT" in the last 72 hours. Sepsis Labs: No results for input(s): "PROCALCITON", "LATICACIDVEN" in the last 168 hours.  Recent Results (from the past 240 hour(s))  Surgical pcr screen     Status: None   Collection Time: 09/26/23  9:30 PM   Specimen: Nasal Mucosa; Nasal Swab  Result Value Ref Range Status   MRSA, PCR NEGATIVE NEGATIVE Final   Staphylococcus aureus NEGATIVE NEGATIVE Final    Comment: (NOTE) The Xpert SA Assay (FDA approved for NASAL specimens in patients 47 years of age and older), is one component of a comprehensive surveillance program. It is not intended to diagnose infection nor to guide or monitor treatment. Performed at [facility], [address], [place], [place] [address]          Radiology Studies: DG HIP UNILAT WITH PELVIS 1V RIGHT  Result Date: 09/27/2023 CLINICAL DATA:  Elective surgery. EXAM: DG HIP  (WITH OR WITHOUT PELVIS) 1V RIGHT COMPARISON:  Preoperative imaging. FINDINGS: Six fluoroscopic spot views of the pelvis and right hip obtained in the operating room. Sequential images during hip arthroplasty. Fluoroscopy time 7 seconds. Dose 0.624 mGy. IMPRESSION: Intraoperative fluoroscopy during right hip arthroplasty. Electronically Signed   By: Narda Rutherford M.D.   On: 09/27/2023 12:52   DG Knee Right Port  Result Date: 09/26/2023 CLINICAL DATA:  Closed displaced fracture right femoral neck. Fall. Evaluate for right knee fracture. EXAM: PORTABLE RIGHT KNEE - 1-2 VIEW COMPARISON:  Frontal and patellar sunrise views of the right knee 05/10/2020 FINDINGS: Mildly decreased bone mineralization. Mild-to-moderate lateral compartment joint space narrowing with mild peripheral osteophytosis, mildly advanced from 05/10/2020. Mild patellofemoral joint space narrowing and superior and inferior degenerative osteophytosis. Within the limitations of overlying clothing artifact, no joint  effusion is seen. Mild-to-moderate atherosclerotic calcifications. IMPRESSION: 1. No acute fracture. 2. Mild-to-moderate lateral compartment and mild patellofemoral compartment osteoarthritis. Electronically Signed   By: Neita Garnet M.D.   On: 09/26/2023 08:11        Scheduled Meds:  amiodarone  100 mg Oral Daily   buPROPion  300 mg Oral Daily   carvedilol  12.5 mg Oral BID   docusate sodium  100 mg Oral BID   fluticasone  2 spray Each Nare Daily   furosemide  40 mg Oral Daily   mirtazapine  7.5 mg Oral QHS   pantoprazole  40 mg Oral q morning   potassium chloride  10 mEq Oral BID   [START ON 09/28/2023] rivaroxaban  10 mg Oral Daily   senna  1 tablet Oral BID   Continuous Infusions:   ceFAZolin (ANCEF) IV       LOS: 2 days    Time spent: 45 minutes spent on chart review, discussion with nursing staff, consultants, updating family and interview/physical exam; more than 50% of that time was spent in counseling  and/or coordination of care.    Joseph Art, DO [facility] Available via Epic secure chat 7am-7pm After these hours, please refer to coverage provider listed on [email or web address] 09/27/2023, 12:55 PM

## 2023-09-27 NOTE — Transfer of Care (Signed)
Immediate Anesthesia Transfer of Care Note  Patient: Yvonne [name]  Procedure(s) Performed: ANTERIOR APPROACH HEMI HIP ARTHROPLASTY (Right: Hip)  Patient Location: PACU  Anesthesia Type:General  Level of Consciousness: sedated  Airway & Oxygen Therapy: Patient Spontanous Breathing and Patient connected to face mask oxygen  Post-op Assessment: Report given to RN and Post -op Vital signs reviewed and stable  Post vital signs: Reviewed and stable  Last Vitals:  Vitals Value Taken Time  BP 95/59 09/27/23 0954  Temp    Pulse 69 09/27/23 0959  Resp 23 09/27/23 0959  SpO2 98 % 09/27/23 0959  Vitals shown include unfiled device data.  Last Pain:  Vitals:   09/27/23 9147  TempSrc: Oral  PainSc:       Patients Stated Pain Goal: 0 (09/26/23 2056)  Complications: No notable events documented.

## 2023-09-27 NOTE — Anesthesia Procedure Notes (Signed)
Procedure Name: Intubation Date/Time: 09/27/2023 8:08 AM  Performed by: Alwyn Ren, CRNAPre-anesthesia Checklist: Patient identified, Emergency Drugs available, Suction available and Patient being monitored Patient Re-evaluated:Patient Re-evaluated prior to induction Oxygen Delivery Method: Circle system utilized Preoxygenation: Pre-oxygenation with 100% oxygen Induction Type: IV induction Ventilation: Mask ventilation without difficulty Laryngoscope Size: Glidescope and 3 Grade View: Grade I Tube type: Oral Tube size: 7.0 mm Number of attempts: 1 Airway Equipment and Method: Oral airway and Rigid stylet Placement Confirmation: ETT inserted through vocal cords under direct vision, positive ETCO2 and breath sounds checked- equal and bilateral Secured at: 21 cm Tube secured with: Tape Dental Injury: Teeth and Oropharynx as per pre-operative assessment

## 2023-09-28 DIAGNOSIS — S72001A Fracture of unspecified part of neck of right femur, initial encounter for closed fracture: Secondary | ICD-10-CM | POA: Diagnosis not present

## 2023-09-28 LAB — CBC
HCT: 27.8 % — ABNORMAL LOW (ref 36.0–46.0)
Hemoglobin: 9 g/dL — ABNORMAL LOW (ref 12.0–15.0)
MCH: 29.2 pg (ref 26.0–34.0)
MCHC: 32.4 g/dL (ref 30.0–36.0)
MCV: 90.3 fL (ref 80.0–100.0)
Platelets: 231 10*3/uL (ref 150–400)
RBC: 3.08 MIL/uL — ABNORMAL LOW (ref 3.87–5.11)
RDW: 14.9 % (ref 11.5–15.5)
WBC: 17.4 10*3/uL — ABNORMAL HIGH (ref 4.0–10.5)
nRBC: 0 % (ref 0.0–0.2)

## 2023-09-28 LAB — BASIC METABOLIC PANEL
Anion gap: 12 (ref 5–15)
BUN: 40 mg/dL — ABNORMAL HIGH (ref 8–23)
CO2: 23 mmol/L (ref 22–32)
Calcium: 9 mg/dL (ref 8.9–10.3)
Chloride: 102 mmol/L (ref 98–111)
Creatinine, Ser: 1.22 mg/dL — ABNORMAL HIGH (ref 0.44–1.00)
GFR, Estimated: 42 mL/min — ABNORMAL LOW (ref >60)
Glucose, Bld: 111 mg/dL — ABNORMAL HIGH (ref 70–99)
Potassium: 3.5 mmol/L (ref 3.5–5.1)
Sodium: 137 mmol/L (ref 135–145)

## 2023-09-28 MED ORDER — ACETAMINOPHEN 500 MG PO TABS
1000.0000 mg | ORAL_TABLET | Freq: Three times a day (TID) | ORAL | Status: DC
  Administered 2023-09-28 – 2023-09-30 (×7): 1000 mg via ORAL
  Filled 2023-09-28 (×7): qty 2

## 2023-09-28 MED ORDER — HYDROCODONE-ACETAMINOPHEN 5-325 MG PO TABS: 1.0000 | ORAL_TABLET | Freq: Four times a day (QID) | ORAL | 0 refills | Status: DC | PRN

## 2023-09-28 MED ORDER — HALOPERIDOL LACTATE 5 MG/ML IJ SOLN
1.0000 mg | Freq: Once | INTRAMUSCULAR | Status: AC
  Administered 2023-09-28: 1 mg via INTRAVENOUS
  Filled 2023-09-28: qty 1

## 2023-09-28 MED ORDER — RIVAROXABAN 15 MG PO TABS
15.0000 mg | ORAL_TABLET | Freq: Every day | ORAL | Status: DC
  Administered 2023-09-29 – 2023-09-30 (×2): 15 mg via ORAL
  Filled 2023-09-28 (×2): qty 1

## 2023-09-28 NOTE — Progress Notes (Signed)
   Subjective:  ANALYCIA STIVERSON is a 89 y.o. female, 1 Day Post-Op    s/p Procedure(s): ANTERIOR APPROACH HEMI HIP ARTHROPLASTY   Patient reports pain as mild, more pain with movement. Denies n/t, fever or  chills. Was resting comfortably when entering the room.   Objective:   VITALS:   Vitals:   09/27/23 1040 09/27/23 1552 09/27/23 1936 09/28/23 0726  BP: 115/61 (!) 105/54 (!) 124/59 122/66  Pulse: 70 83 82 82  Resp: 16 16 20    Temp: (!) 97.5 F (36.4 C) (!) 97.5 F (36.4 C) 98.5 F (36.9 C) 97.9 F (36.6 C)  TempSrc: Oral Oral Oral Oral  SpO2: 95% 97% 94% 93%  Weight:      Height:        NAD    RLE:   Neurovascular intact Sensation intact distally Intact pulses distally Dorsiflexion/Plantar flexion intact Incision: dressing C/D/I Compartment soft B/l calves soft , non tender  Lab Results  Component Value Date   WBC 11.9 (H) 09/27/2023   HGB 11.2 (L) 09/27/2023   HCT 35.0 (L) 09/27/2023   MCV 90.2 09/27/2023   PLT 258 09/27/2023   BMET    Component Value Date/Time   NA 136 09/27/2023 0336   NA 141 09/10/2023 1313   K 3.8 09/27/2023 0336   CL 102 09/27/2023 0336   CO2 25 09/27/2023 0336   GLUCOSE 120 (H) 09/27/2023 0336   BUN 28 (H) 09/27/2023 0336   BUN 37 (H) 09/10/2023 1313   CREATININE 1.18 (H) 09/27/2023 0336   CREATININE 0.84 06/10/2016 1108   CALCIUM 9.0 09/27/2023 0336   EGFR 39 (L) 09/10/2023 1313   GFRNONAA 43 (L) 09/27/2023 0336     Assessment/Plan: 1 Day Post-Op   Principal Problem:   Closed displaced fracture of right femoral neck (HCC) Active Problems:   GERD   Paroxysmal atrial fibrillation (HCC)   Complete heart block (HCC)   Pacemaker -    Fall at home, initial encounter   Chronic anticoagulation   Heart failure with reduced ejection fraction (HCC)   Essential hypertension   Normocytic anemia   Anxiety   Urinary urgency   Advance diet Up with therapy  Dispo: Per medical team , pain rx printed.   Weightbearing  Status: WBAT with walker   DVT Prophylaxis: baseline xarelto   Arbie Cookey 09/28/2023, 7:38 AM  Dion Saucier PA-C  Physician Assistant with Dr. Rebekah [facility]

## 2023-09-28 NOTE — Progress Notes (Signed)
PROGRESS NOTE    CATIA HIERONYMUS  ZDG:[id number] DOB: [birth year] DOA: 09/25/2023 PCP: Thana Ates, MD    Brief Narrative:  Yvonne [name] is a 89 y.o. female with medical history significant of paroxysmal atrial fibrillation on chronic anticoagulation, congestive heart failure, complete heart block s/p Saint Jude pacemaker, GI bleed, GERD, and arthritis who presents after having a fall.  At baseline she has been living independently in an assisted living facility called [facility] for the last 7 years. The incident occurred when the patient was moving from the bedroom to the hall, intending to take bottled water to the kitchen when she fell.  She was using her rollator at the time. As a result of the fall, the patient is experiencing severe pain in the hip area    Assessment and Plan: Right femoral neck fracture secondary to fall Acute.  Patient normally ambulates with the use of a rollator and had a mechanical fall landing on her right side.  Found to have a right femoral neck fracture with varus impaction. -Orthopedics--- s/p surgery 11/30 -pain regimen -PT/OT eval -delirium precautions    Complete heart block s/p PM Paroxysmal atrial fibrillation on chronic anticoagulation Patient appears to be rate controlled at this time.  Last dose of Xarelto was yesterday evening around 6 PM.  Patient has a St Jude dual pacemaker implanted in 08/2017 for intermittent complete heart block. -resume Xarelto -Continue Coreg and amiodarone   Heart failure with reduced EF  Chronic.  Patient appears to be euvolemic on physical exam at this time.  Last echocardiogram noted EF to be 40 to 45% with grade 1 diastolic dysfunction back in 2017.CHA2DS2-VASc score equal to at least 4. -Strict I&Os and daily weights -Continue Coreg and furosemide   Essential hypertension Blood pressures currently maintained. -Continue home blood pressure regimen   Normocytic anemia Hemoglobin 10.9 which appears similar to  prior check on 11/13. -trend   Anxiety -Continue Xanax as needed and Wellbutrin   Urinary urgency -Check urinalysis   GERD -Continue Protonix   DVT prophylaxis: rivaroxaban (XARELTO) tablet 10 mg Start: 09/28/23 1000 SCDs Start: 09/27/23 1140 rivaroxaban (XARELTO) tablet 10 mg    Code Status: Full Code Called son  Disposition Plan:  Level of care: Telemetry Surgical Status is: Inpatient Remains inpatient appropriate    Consultants:  ortho  Subjective: Has some confusion overnight  Objective: Vitals:   09/27/23 1040 09/27/23 1552 09/27/23 1936 09/28/23 0726  BP: 115/61 (!) 105/54 (!) 124/59 122/66  Pulse: 70 83 82 82  Resp: 16 16 20    Temp: (!) 97.5 F (36.4 C) (!) 97.5 F (36.4 C) 98.5 F (36.9 C) 97.9 F (36.6 C)  TempSrc: Oral Oral Oral Oral  SpO2: 95% 97% 94% 93%  Weight:      Height:        Intake/Output Summary (Last 24 hours) at 09/28/2023 1201 Last data filed at 09/28/2023 5956 Gross per 24 hour  Intake 100 ml  Output 1050 ml  Net -950 ml   Filed Weights   09/25/23 0447 09/27/23 0717  Weight: 65.2 kg 65.2 kg    Examination:     General: Appearance:    Well developed, well nourished female who is sleeping     Lungs:     Clear to auscultation bilaterally, respirations unlabored  Heart:    Normal heart rate.      MS:   All extremities are intact.   Neurologic:  sleeping      Data  Reviewed: I have personally reviewed following labs and imaging studies  CBC: Recent Labs  Lab 09/25/23 0438 09/26/23 0612 09/27/23 0336 09/28/23 0724  WBC 7.7 11.3* 11.9* 17.4*  NEUTROABS 5.6  --   --   --   HGB 10.9* 10.6* 11.2* 9.0*  HCT 34.9* 33.6* 35.0* 27.8*  MCV 93.8 90.3 90.2 90.3  PLT 271 248 258 231   Basic Metabolic Panel: Recent Labs  Lab 09/25/23 0438 09/26/23 0612 09/27/23 0336 09/28/23 0724  NA 139 135 136 137  K 3.7 3.5 3.8 3.5  CL 110 100 102 102  CO2 23 25 25 23   GLUCOSE 100* 92 120* 111*  BUN 35* 26* 28* 40*   CREATININE 1.06* 1.11* 1.18* 1.22*  CALCIUM 8.9 9.1 9.0 9.0  MG 2.6*  --   --   --    GFR: Estimated Creatinine Clearance: 28.6 mL/min (A) (by C-G formula based on SCr of 1.22 mg/dL (H)). Liver Function Tests: Recent Labs  Lab 09/25/23 0438  AST 26  ALT 20  ALKPHOS 78  BILITOT 0.3  PROT 6.6  ALBUMIN 3.7   No results for input(s): "LIPASE", "AMYLASE" in the last 168 hours. No results for input(s): "AMMONIA" in the last 168 hours. Coagulation Profile: Recent Labs  Lab 09/25/23 0438  INR 2.0*   Cardiac Enzymes: No results for input(s): "CKTOTAL", "CKMB", "CKMBINDEX", "TROPONINI" in the last 168 hours. BNP (last 3 results) No results for input(s): "PROBNP" in the last 8760 hours. HbA1C: No results for input(s): "HGBA1C" in the last 72 hours. CBG: No results for input(s): "GLUCAP" in the last 168 hours. Lipid Profile: No results for input(s): "CHOL", "HDL", "LDLCALC", "TRIG", "CHOLHDL", "LDLDIRECT" in the last 72 hours. Thyroid Function Tests: No results for input(s): "TSH", "T4TOTAL", "FREET4", "T3FREE", "THYROIDAB" in the last 72 hours. Anemia Panel: No results for input(s): "VITAMINB12", "FOLATE", "FERRITIN", "TIBC", "IRON", "RETICCTPCT" in the last 72 hours. Sepsis Labs: No results for input(s): "PROCALCITON", "LATICACIDVEN" in the last 168 hours.  Recent Results (from the past 240 hour(s))  Surgical pcr screen     Status: None   Collection Time: 09/26/23  9:30 PM   Specimen: Nasal Mucosa; Nasal Swab  Result Value Ref Range Status   MRSA, PCR NEGATIVE NEGATIVE Final   Staphylococcus aureus NEGATIVE NEGATIVE Final    Comment: (NOTE) The Xpert SA Assay (FDA approved for NASAL specimens in patients 71 years of age and older), is one component of a comprehensive surveillance program. It is not intended to diagnose infection nor to guide or monitor treatment. Performed at [facility], [address], [place], [place] [address]          Radiology  Studies: DG HIP UNILAT WITH PELVIS 1V RIGHT  Result Date: 09/27/2023 CLINICAL DATA:  Elective surgery. EXAM: DG HIP (WITH OR WITHOUT PELVIS) 1V RIGHT COMPARISON:  Preoperative imaging. FINDINGS: Six fluoroscopic spot views of the pelvis and right hip obtained in the operating room. Sequential images during hip arthroplasty. Fluoroscopy time 7 seconds. Dose 0.624 mGy. IMPRESSION: Intraoperative fluoroscopy during right hip arthroplasty. Electronically Signed   By: Narda Rutherford M.D.   On: 09/27/2023 12:52        Scheduled Meds:  acetaminophen  1,000 mg Oral TID   amiodarone  100 mg Oral Daily   buPROPion  300 mg Oral Daily   carvedilol  12.5 mg Oral BID   docusate sodium  100 mg Oral BID   fluticasone  2 spray Each Nare Daily  furosemide  40 mg Oral Daily   mirtazapine  7.5 mg Oral QHS   pantoprazole  40 mg Oral q morning   potassium chloride  10 mEq Oral BID   rivaroxaban  10 mg Oral Daily   senna  1 tablet Oral BID   Continuous Infusions:     LOS: 3 days    Time spent: 45 minutes spent on chart review, discussion with nursing staff, consultants, updating family and interview/physical exam; more than 50% of that time was spent in counseling and/or coordination of care.    Joseph Art, DO [facility] Available via Epic secure chat 7am-7pm After these hours, please refer to coverage provider listed on [email or web address] 09/28/2023, 12:01 PM

## 2023-09-28 NOTE — Plan of Care (Signed)
  Problem: Education: Goal: Knowledge of General Education information will improve Description Including pain rating scale, medication(s)/side effects and non-pharmacologic comfort measures Outcome: Progressing   

## 2023-09-28 NOTE — Evaluation (Signed)
Occupational Therapy Evaluation Patient Details Name: Yvonne [name] MRN: [id number] DOB: 26-Mar-1929 Today's Date: 09/28/2023   History of Present Illness 89 y.o. female presents to [facility] 09/25/23 after falling. Pt w/ R femoral neck fx s/p R hip hemiarthroplasty, anterior approach on 11/30. PMHx: complete heart block s/p PM, PAF on chronic anticoagulation, HFrEF, HTN, anemia, arthritis, GERD   Clinical Impression   Patient is s/p Direct anterior R THA surgery resulting in functional limitations due to the deficits listed below (see OT problem list). Pt briefly living in indep living apartment and receiving meals that she ordered to eat in her apartment. Pt reports having family (A) as needed. Pt could benefit from pullup for all therapy sessions due to incontinence at this time. Uncertain if facility can increase level of care to allow for skilled level within current setting. Pt will benefit from similar environment to maximize function. If facility does not have a decreased level of care will need community based care.  Patient will benefit from skilled OT acutely to increase independence and safety with ADLS to allow discharge skilled inpatient follow up therapy, <3 hours/day at facility if possible ( respite care options).        If plan is discharge home, recommend the following: Two people to help with walking and/or transfers;A lot of help with bathing/dressing/bathroom    Functional Status Assessment  Patient has had a recent decline in their functional status and demonstrates the ability to make significant improvements in function in a reasonable and predictable amount of time.  Equipment Recommendations  BSC/3in1    Recommendations for Other Services       Precautions / Restrictions Precautions Precautions: Fall Restrictions Weight Bearing Restrictions: Yes RLE Weight Bearing: Weight bearing as tolerated Other Position/Activity Restrictions: no hip precautions       Mobility Bed Mobility Overal bed mobility: Needs Assistance Bed Mobility: Rolling, Supine to Sit, Sit to Supine Rolling: Mod assist, Used rails   Supine to sit: Mod assist, HOB elevated, Used rails Sit to supine: Mod assist, Used rails   General bed mobility comments: pt requires step by step directions to progress to eob. pt with hob 42 degrees to help. pt exiting R side. pt iwth pad used total +2 mod (A) to scoot to eob. pt static sitting CGA. pt requires (A) to left R LE onto bed but able to place LLE.    Transfers Overall transfer level: Needs assistance Equipment used: Rolling walker (2 wheels) Transfers: Sit to/from Stand, Bed to chair/wheelchair/BSC Sit to Stand: +2 physical assistance, Mod assist, From elevated surface Stand pivot transfers: +2 physical assistance, Mod assist, From elevated surface         General transfer comment: pt requires (A) to anterior shift and static standing in RW. pt tolerates wbat well. pt incontinence greatest distraction during transfer. pt reports recieving medications to help with void bladder      Balance Overall balance assessment: Needs assistance Sitting-balance support: Bilateral upper extremity supported, Feet supported Sitting balance-Leahy Scale: Fair     Standing balance support: Bilateral upper extremity supported, During functional activity, Reliant on assistive device for balance Standing balance-Leahy Scale: Poor                             ADL either performed or assessed with clinical judgement   ADL Overall ADL's : Needs assistance/impaired Eating/Feeding: Set up   Grooming: Set up;Bed level Grooming Details (indicate cue type and  reason): pt requesting staff get her personal tooth brush that is not present. pt redirected a few times and settled to use the hospital issued brush. pt requesting floss that is not present in the room.             Lower Body Dressing: Maximal assistance   Toilet  Transfer: +2 for physical assistance;+2 for safety/equipment;Maximal assistance;Stand-pivot;BSC/3in1 Toilet Transfer Details (indicate cue type and reason): incontinence of bladder and awareness with leakage other wise decreaed awareness. pt unable to control drainage with every transfer in standing. pt asked to void on BSC and only minimal amount voided. pt voiding with all sit<>stand during session. recommend brief for hygiene and to progress the patient Toileting- Architect and Hygiene: Moderate assistance;Sit to/from stand Toileting - Clothing Manipulation Details (indicate cue type and reason): pt able to stand and perform peri care with steady support     Functional mobility during ADLs: +2 for physical assistance;Moderate assistance;Rolling walker (2 wheels)       Vision Baseline Vision/History: 1 Wears glasses Ability to See in Adequate Light: 0 Adequate Patient Visual Report: No change from baseline Vision Assessment?: No apparent visual deficits     Perception         Praxis         Pertinent Vitals/Pain Pain Assessment Pain Assessment: Faces Faces Pain Scale: Hurts little more Pain Location: R hip Pain Descriptors / Indicators: Discomfort, Grimacing, Guarding Pain Intervention(s): Limited activity within patient's tolerance, Monitored during session, Premedicated before session, Repositioned     Extremity/Trunk Assessment Upper Extremity Assessment Upper Extremity Assessment: Generalized weakness   Lower Extremity Assessment Lower Extremity Assessment: Defer to PT evaluation   Cervical / Trunk Assessment Cervical / Trunk Assessment: Kyphotic   Communication Communication Communication: No apparent difficulties   Cognition Arousal: Alert Behavior During Therapy: WFL for tasks assessed/performed Overall Cognitive Status: History of cognitive impairments - at baseline                                 General Comments: pt is oriented  to person place and location. pt requesting multiple times for breakfast items and advised that the meal will need to be something in the lunch/ dinner area. pt making references at times between her hospital room and her [employer] benefits. We had to redirect that those catered request for meal does not exist at this location. pt advised to have daughter help deliver her personal tooth brush. pull ups and meal items that are not offered.     General Comments  incision intact and dry at this time    Exercises     Shoulder Instructions      Home Living Family/patient expects to be discharged to:: Assisted living                             Home Equipment: Rollator (4 wheels);Cane - single point;Wheelchair - manual;Grab bars - toilet;Grab bars - tub/shower;Shower seat          Prior Functioning/Environment Prior Level of Function : Needs assist       Physical Assist : Mobility (physical);ADLs (physical) Mobility (physical): Transfers;Gait ADLs (physical): Bathing;Grooming;Dressing;Toileting;IADLs Mobility Comments: uses SP cane in apartment, uses rollator in community. Walks short distances ADLs Comments: staff assists with ADLs, will sit on shower seat to bathe        OT Problem List: Decreased  strength;Decreased activity tolerance;Impaired balance (sitting and/or standing);Decreased safety awareness;Decreased knowledge of use of DME or AE;Decreased knowledge of precautions;Pain      OT Treatment/Interventions: Self-care/ADL training;Therapeutic exercise;Energy conservation;Manual therapy;DME and/or AE instruction;Modalities;Therapeutic activities;Cognitive remediation/compensation;Patient/family education;Balance training    OT Goals(Current goals can be found in the care plan section) Acute Rehab OT Goals Patient Stated Goal: to be able to take care of herself again OT Goal Formulation: Patient unable to participate in goal setting Time For Goal Achievement:  10/12/23 Potential to Achieve Goals: Good  OT Frequency: Min 1X/week    Co-evaluation PT/OT/SLP Co-Evaluation/Treatment: Yes Reason for Co-Treatment: Complexity of the patient's impairments (multi-system involvement);For patient/therapist safety;Necessary to address cognition/behavior during functional activity;To address functional/ADL transfers PT goals addressed during session: Mobility/safety with mobility;Balance;Proper use of DME OT goals addressed during session: ADL's and self-care;Proper use of Adaptive equipment and DME;Strengthening/ROM      AM-PAC OT "6 Clicks" Daily Activity     Outcome Measure Help from another person eating meals?: None Help from another person taking care of personal grooming?: A Little Help from another person toileting, which includes using toliet, bedpan, or urinal?: A Little Help from another person bathing (including washing, rinsing, drying)?: A Little Help from another person to put on and taking off regular upper body clothing?: A Little Help from another person to put on and taking off regular lower body clothing?: A Lot 6 Click Score: 18   End of Session Equipment Utilized During Treatment: Gait belt;Rolling walker (2 wheels) Nurse Communication: Mobility status;Precautions  Activity Tolerance: Patient tolerated treatment well Patient left: in bed;with call bell/phone within reach;with bed alarm set  OT Visit Diagnosis: Unsteadiness on feet (R26.81);Muscle weakness (generalized) (M62.81)                Time: 1610-9604 OT Time Calculation (min): 30 min Charges:  OT General Charges $OT Visit: 1 Visit OT Evaluation $OT Eval Moderate Complexity: 1 Mod   Brynn, OTR/L  Acute Rehabilitation Services Office: [phone] .   Mateo Flow 09/28/2023, 12:01 PM

## 2023-09-28 NOTE — TOC CAGE-AID Note (Signed)
Transition of Care [facility]) - CAGE-AID Screening  Patient Details  Name: Yvonne [name] MRN: [id number] Date of Birth: 1929-07-04  Clinical Narrative:  Patient denies any alcohol or drug use, no need to provide substance abuse resources at this time.  CAGE-AID Screening:   Have You Ever Felt You Ought to Cut Down on Your Drinking or Drug Use?: No Have People Annoyed You By Critizing Your Drinking Or Drug Use?: No Have You Felt Bad Or Guilty About Your Drinking Or Drug Use?: No Have You Ever Had a Drink or Used Drugs First Thing In The Morning to Steady Your Nerves or to Get Rid of a Hangover?: No CAGE-AID Score: 0  Substance Abuse Education Offered: No

## 2023-09-28 NOTE — Progress Notes (Addendum)
    Patient Name: PAISLEI GUSTINE           DOB: May 21, 1929  MRN: [id number]      Admission Date: 09/25/2023  Attending Provider: Joseph Art, DO  Primary Diagnosis: Closed displaced fracture of right femoral neck [facility])   Level of care: Telemetry Surgical    CROSS COVER NOTE   Date of Service   09/28/2023   CHARNEA VALDIVIEZO, 89 y.o. female, was admitted on 09/25/2023 for Closed displaced fracture of right femoral neck (HCC).    HPI/Events of Note   Restlessness and confusion Labile confusion initially documented on 11/29.  RN staff reports increased in restlessness postop. Currently removing medical equipment, attempting to get out of bed multiple times.  High fall risk.  Will trial TeleSitter given risk for fall. Unfortunately, she is not following commands despite multiple attempts at redirection by staff.  Patient had right hip hemiarthroplasty on the morning of 11/30, no pain reported by patient, no signs of pain assessed by RN. Patient has already received Xanax for anxiety, and Norco for pain.    Interventions/ Plan   Will trial IV Haldol for restlessness.  Telemetry sitter        Anthoney Harada, DNP, [facility] [place]

## 2023-09-28 NOTE — Evaluation (Signed)
Physical Therapy Evaluation Patient Details Name: Yvonne [name] MRN: [id number] DOB: 24-Apr-1929 Today's Date: 09/28/2023  History of Present Illness  89 y.o. female presents to [facility] 09/25/23 after falling. Pt w/ R femoral neck fx s/p R hip hemiarthroplasty, anterior approach on 11/30. PMHx: complete heart block s/p PM, PAF on chronic anticoagulation, HFrEF, HTN, anemia, arthritis, GERD   Clinical Impression  Pt in bed upon arrival and agreeable to PT eval. Pt reports using a SP cane in her apartment and rollator in the community. Pt reports staff helps with all mobility. In today's session, pt required ModAx2 to stand and pivot to [facility]. Pt with urinary incontinence and would benefit from pullup for all therapy sessions. Pt presents to therapy session with decreased LE strength, ROM, balance, and mobility. Pt would benefit from acute skilled PT to address functional impairments. Recommending post-acute rehab <3hrs to increase independence. Pt would benefit from familiar environment, however, unclear if current living situation has a <3hrs rehab section. Acute PT to follow.          If plan is discharge home, recommend the following: A lot of help with walking and/or transfers;A lot of help with bathing/dressing/bathroom;Assistance with cooking/housework;Direct supervision/assist for medications management;Direct supervision/assist for financial management;Assist for transportation;Help with stairs or ramp for entrance   Can travel by private vehicle   No    Equipment Recommendations None recommended by PT     Functional Status Assessment Patient has had a recent decline in their functional status and demonstrates the ability to make significant improvements in function in a reasonable and predictable amount of time.     Precautions / Restrictions Precautions Precautions: Fall Restrictions Weight Bearing Restrictions: Yes RLE Weight Bearing: Weight bearing as tolerated Other  Position/Activity Restrictions: no hip precautions      Mobility  Bed Mobility Overal bed mobility: Needs Assistance Bed Mobility: Rolling, Supine to Sit, Sit to Supine Rolling: Mod assist, Used rails   Supine to sit: Mod assist, HOB elevated, Used rails Sit to supine: Mod assist, Used rails   General bed mobility comments: Cues for step by step sequencing. Used bed pad with ModAx2 to scoot to eob once in sitting. pt static sitting CGA. For sit/supine, pt requires assist to bring R LE onto bed but able to place LLE.    Transfers Overall transfer level: Needs assistance Equipment used: Rolling walker (2 wheels) Transfers: Sit to/from Stand, Bed to chair/wheelchair/BSC Sit to Stand: +2 physical assistance, Mod assist, From elevated surface Stand pivot transfers: +2 physical assistance, Mod assist, From elevated surface      General transfer comment: ModAx2 for anterior shift, boost up, rise and steadying. Required assist to shift hips towards BSC as pt attempted to sit before achieving good alignment. Incontinence in standing and transfers    Ambulation/Gait    General Gait Details: unable to take steps at this time     Balance Overall balance assessment: Needs assistance Sitting-balance support: Bilateral upper extremity supported, Feet supported Sitting balance-Leahy Scale: Fair     Standing balance support: Bilateral upper extremity supported, During functional activity, Reliant on assistive device for balance Standing balance-Leahy Scale: Poor Standing balance comment: reliant on RW          Pertinent Vitals/Pain Pain Assessment Pain Assessment: Faces Faces Pain Scale: Hurts little more Pain Location: R hip Pain Descriptors / Indicators: Discomfort, Grimacing, Guarding Pain Intervention(s): Limited activity within patient's tolerance, Monitored during session, Repositioned, Premedicated before session    Home Living Family/patient expects to  be discharged to::  Assisted living      Home Equipment: Rollator (4 wheels);Cane - single point;Wheelchair - manual;Grab bars - toilet;Grab bars - tub/shower;Shower seat      Prior Function Prior Level of Function : Needs assist       Physical Assist : Mobility (physical);ADLs (physical) Mobility (physical): Transfers;Gait ADLs (physical): Bathing;Grooming;Dressing;Toileting;IADLs Mobility Comments: uses SP cane in apartment, uses rollator in community. Walks short distances ADLs Comments: staff assists with ADLs, will sit on shower seat to bathe     Extremity/Trunk Assessment   Upper Extremity Assessment Upper Extremity Assessment: Defer to OT evaluation    Lower Extremity Assessment Lower Extremity Assessment: Generalized weakness (R>L as anticipated with surgical procedure, limited ROM due to pain)    Cervical / Trunk Assessment Cervical / Trunk Assessment: Kyphotic  Communication   Communication Communication: No apparent difficulties  Cognition Arousal: Alert Behavior During Therapy: WFL for tasks assessed/performed Overall Cognitive Status: History of cognitive impairments - at baseline      General Comments: pt is oriented to person place and location. Pt makes comparisons to being at [employer] and thinks they will be making meals for her here.        General Comments General comments (skin integrity, edema, etc.): VSS on [place], invision intact and dry     PT Assessment Patient needs continued PT services  PT Problem List Decreased strength;Decreased range of motion;Decreased activity tolerance;Decreased balance;Decreased mobility;Decreased safety awareness;Decreased knowledge of use of DME       PT Treatment Interventions DME instruction;Gait training;Functional mobility training;Therapeutic activities;Therapeutic exercise;Balance training;Patient/family education;Neuromuscular re-education    PT Goals (Current goals can be found in the Care Plan section)  Acute Rehab PT  Goals Patient Stated Goal: to go home PT Goal Formulation: With patient Time For Goal Achievement: 10/12/23 Potential to Achieve Goals: Good    Frequency Min 1X/week     Co-evaluation PT/OT/SLP Co-Evaluation/Treatment: Yes Reason for Co-Treatment: Complexity of the patient's impairments (multi-system involvement);For patient/therapist safety;Necessary to address cognition/behavior during functional activity;To address functional/ADL transfers PT goals addressed during session: Mobility/safety with mobility;Balance;Proper use of DME OT goals addressed during session: ADL's and self-care;Proper use of Adaptive equipment and DME;Strengthening/ROM       AM-PAC PT "6 Clicks" Mobility  Outcome Measure Help needed turning from your back to your side while in a flat bed without using bedrails?: A Lot Help needed moving from lying on your back to sitting on the side of a flat bed without using bedrails?: A Lot Help needed moving to and from a bed to a chair (including a wheelchair)?: Total Help needed standing up from a chair using your arms (e.g., wheelchair or bedside chair)?: Total Help needed to walk in hospital room?: Total Help needed climbing 3-5 steps with a railing? : Total 6 Click Score: 8    End of Session Equipment Utilized During Treatment: Gait belt;Oxygen Activity Tolerance: Patient tolerated treatment well Patient left: in bed;with call bell/phone within reach;with bed alarm set Nurse Communication: Mobility status PT Visit Diagnosis: Unsteadiness on feet (R26.81);History of falling (Z91.81);Muscle weakness (generalized) (M62.81)    Time: 1106-1130 PT Time Calculation (min) (ACUTE ONLY): 24 min   Charges:   PT Evaluation $PT Eval Low Complexity: 1 Low   PT General Charges $$ ACUTE PT VISIT: 1 Visit        Hilton Cork, PT, DPT Secure Chat Preferred  Rehab Office [phone]  Arturo Morton Brion Aliment 09/28/2023, 1:10 PM

## 2023-09-28 NOTE — Progress Notes (Signed)
At 0230,the patient started being confused,talking a lot and pulling out at items on her.She was even seated at the edge of the bed ready to stand and get out.Doctor was informed who put and order for haloperidol and tele-monitoring.The patient was able to calm down after the administration of haloperidol.

## 2023-09-29 ENCOUNTER — Encounter (HOSPITAL_COMMUNITY): Payer: Self-pay | Admitting: Orthopedic Surgery

## 2023-09-29 DIAGNOSIS — S72001A Fracture of unspecified part of neck of right femur, initial encounter for closed fracture: Secondary | ICD-10-CM | POA: Diagnosis not present

## 2023-09-29 LAB — BASIC METABOLIC PANEL
Anion gap: 12 (ref 5–15)
BUN: 43 mg/dL — ABNORMAL HIGH (ref 8–23)
CO2: 23 mmol/L (ref 22–32)
Calcium: 8.5 mg/dL — ABNORMAL LOW (ref 8.9–10.3)
Chloride: 103 mmol/L (ref 98–111)
Creatinine, Ser: 1.2 mg/dL — ABNORMAL HIGH (ref 0.44–1.00)
GFR, Estimated: 42 mL/min — ABNORMAL LOW (ref >60)
Glucose, Bld: 90 mg/dL (ref 70–99)
Potassium: 3.3 mmol/L — ABNORMAL LOW (ref 3.5–5.1)
Sodium: 138 mmol/L (ref 135–145)

## 2023-09-29 LAB — CBC
HCT: 27.6 % — ABNORMAL LOW (ref 36.0–46.0)
Hemoglobin: 8.7 g/dL — ABNORMAL LOW (ref 12.0–15.0)
MCH: 29.1 pg (ref 26.0–34.0)
MCHC: 31.5 g/dL (ref 30.0–36.0)
MCV: 92.3 fL (ref 80.0–100.0)
Platelets: 230 10*3/uL (ref 150–400)
RBC: 2.99 MIL/uL — ABNORMAL LOW (ref 3.87–5.11)
RDW: 15.3 % (ref 11.5–15.5)
WBC: 9.5 10*3/uL (ref 4.0–10.5)
nRBC: 0 % (ref 0.0–0.2)

## 2023-09-29 MED ORDER — ENSURE ENLIVE PO LIQD
237.0000 mL | Freq: Two times a day (BID) | ORAL | Status: DC
Start: 2023-09-30 — End: 2023-09-30
  Administered 2023-09-30: 237 mL via ORAL

## 2023-09-29 MED ORDER — ADULT MULTIVITAMIN W/MINERALS CH
1.0000 | ORAL_TABLET | Freq: Every day | ORAL | Status: DC
  Administered 2023-09-29 – 2023-09-30 (×2): 1 via ORAL
  Filled 2023-09-29 (×2): qty 1

## 2023-09-29 MED ORDER — POTASSIUM CHLORIDE CRYS ER 20 MEQ PO TBCR
40.0000 meq | EXTENDED_RELEASE_TABLET | Freq: Once | ORAL | Status: AC
  Administered 2023-09-29: 40 meq via ORAL
  Filled 2023-09-29: qty 2

## 2023-09-29 NOTE — TOC Initial Note (Addendum)
Transition of Care [facility]) - Initial/Assessment Note    Patient Details  Name: Yvonne [name] MRN: [id number] Date of Birth: 01-05-1929  Transition of Care [facility]) CM/SW Contact:    Lorri Frederick, LCSW Phone Number: 09/29/2023, 2:08 PM  Clinical Narrative:       CSW met with pt regarding PT recommendation for SNF.  Pt from [facility] independent [facility]. No current services. Pt is agreeable to SNF, medicare choice document provided.   Permission given to speak with son Nedra Hai, daughter Beverely Low.  CSW spoke with son Nedra Hai by phone, he is also agreeable with plan for SNF.  His sister Beverely Low will be to the hospital later today and will get the medicare.gov list.  Auth request submitted in HTA-left message- facility choice pending.   PASSR requires additional info.           Expected Discharge Plan: Skilled Nursing Facility Barriers to Discharge: Continued Medical Work up, SNF Pending bed offer   Patient Goals and CMS Choice Patient states their goals for this hospitalization and ongoing recovery are:: get back to [facility] CMS Medicare.gov Compare Post Acute Care list provided to:: Patient Choice offered to / list presented to : Patient, Adult Children (adult children: Nedra Hai and Beverely Low)      Expected Discharge Plan and Services In-house Referral: Clinical Social Work   Post Acute Care Choice: Skilled Nursing Facility Living arrangements for the past 2 months: Independent Living Facility ([facility])                                      Prior Living Arrangements/Services Living arrangements for the past 2 months: Independent [facility] ([facility]) Lives with:: Facility Resident, Self Patient language and need for interpreter reviewed:: Yes Do you feel safe going back to the place where you live?: Yes      Need for Family Participation in Patient Care: Yes (Comment) Care giver support system in place?: Yes (comment) Current home services: Other (comment)  (none) Criminal Activity/Legal Involvement Pertinent to Current Situation/Hospitalization: No - Comment as needed  Activities of Daily Living   ADL Screening (condition at time of admission) Independently performs ADLs?: Yes (appropriate for developmental age) (prior to today's  admission) Is the patient deaf or have difficulty hearing?: No Does the patient have difficulty seeing, even when wearing glasses/contacts?: Yes Does the patient have difficulty concentrating, remembering, or making decisions?: No  Permission Sought/Granted Permission sought to share information with : Family Supports Permission granted to share information with : Yes, Verbal Permission Granted  Share Information with NAME: daughter Beverely Low, son Nedra Hai  Permission granted to share info w AGENCY: SNF        Emotional Assessment Appearance:: Appears stated age Attitude/Demeanor/Rapport: Engaged Affect (typically observed): Appropriate Orientation: : Oriented to Self, Oriented to Place, Oriented to  Time, Oriented to Situation      Admission diagnosis:  Hip fracture (HCC) [S72.009A] Closed fracture of right hip, initial encounter (HCC) [S72.001A] Laceration of right hand without foreign body, initial encounter [S61.411A] Patient Active Problem List   Diagnosis Date Noted   Closed displaced fracture of right femoral neck (HCC) 09/25/2023   Fall at home, initial encounter 09/25/2023   Chronic anticoagulation 09/25/2023   Heart failure with reduced ejection fraction (HCC) 09/25/2023   Essential hypertension 09/25/2023   Normocytic anemia 09/25/2023   Anxiety 09/25/2023   Urinary urgency 09/25/2023   Pacemaker -  05/10/2022  Unilateral primary osteoarthritis, left knee 05/10/2020   PAF (paroxysmal atrial fibrillation) (HCC) 09/27/2017   Complete heart block (HCC) 09/26/2017   Hypokalemia 04/29/2016   NICM (nonischemic cardiomyopathy) (HCC) 04/29/2016   LBBB (left bundle branch block) 04/29/2016    Paroxysmal atrial fibrillation (HCC) 04/25/2016   Acute systolic congestive heart failure, NYHA class 3 (HCC) 04/25/2016   Bilateral pleural effusion 04/25/2016   Acute respiratory failure with hypoxia (HCC) 04/25/2016   Positive D dimer 04/25/2016   Protein calorie malnutrition (HCC) 04/25/2016   Stage 1 decubitus ulcer/LOW THORACIC spine 04/25/2016   Physical deconditioning 04/25/2016   Fracture, humerus, proximal 04/10/2015   DYSURIA 10/08/2010   MITRAL REGURGITATION 04/13/2009   LEFT BUNDLE BRANCH BLOCK 04/13/2009   GERD 04/13/2009   Palpitations 04/13/2009   PEPTIC ULCER DISEASE, HX OF 04/13/2009   SVT/ PSVT/ PAT 03/26/2009   PCP:  Thana Ates, MD Pharmacy:   [facility] - [place], [place] - [address] [place] [address] Phone: [phone] Fax: [phone]     Social Determinants of Health (SDOH) Social History: SDOH Screenings   Food Insecurity: No Food Insecurity (09/25/2023)  Housing: High Risk (09/25/2023)  Transportation Needs: No Transportation Needs (09/25/2023)  Utilities: Not At Risk (09/25/2023)  Tobacco Use: Low Risk  (09/27/2023)   SDOH Interventions:     Readmission Risk Interventions     No data to display

## 2023-09-29 NOTE — NC FL2 (Signed)
[place] MEDICAID FL2 LEVEL OF CARE FORM     IDENTIFICATION  Patient Name: Yvonne [name] Birthdate: 10/08/1929 Sex: female Admission Date (Current Location): 09/25/2023  [facility] and [place] Number:  Producer, television/film/video and Address:  [facility], [address] [place], [place] [address]      Provider Number: [id number]  Attending Physician Name and Address:  Joseph Art, DO  Relative Name and Phone Number:  [place], [place] [phone]  [phone]    Current Level of Care: Hospital Recommended Level of Care: Skilled Nursing Facility Prior Approval Number:    Date Approved/Denied:   PASRR Number:    Discharge Plan: SNF    Current Diagnoses: Patient Active Problem List   Diagnosis Date Noted   Closed displaced fracture of right femoral neck (HCC) 09/25/2023   Fall at home, initial encounter 09/25/2023   Chronic anticoagulation 09/25/2023   Heart failure with reduced ejection fraction (HCC) 09/25/2023   Essential hypertension 09/25/2023   Normocytic anemia 09/25/2023   Anxiety 09/25/2023   Urinary urgency 09/25/2023   Pacemaker -  05/10/2022   Unilateral primary osteoarthritis, left knee 05/10/2020   PAF (paroxysmal atrial fibrillation) (HCC) 09/27/2017   Complete heart block (HCC) 09/26/2017   Hypokalemia 04/29/2016   NICM (nonischemic cardiomyopathy) (HCC) 04/29/2016   LBBB (left bundle branch block) 04/29/2016   Paroxysmal atrial fibrillation (HCC) 04/25/2016   Acute systolic congestive heart failure, NYHA class 3 (HCC) 04/25/2016   Bilateral pleural effusion 04/25/2016   Acute respiratory failure with hypoxia (HCC) 04/25/2016   Positive D dimer 04/25/2016   Protein calorie malnutrition (HCC) 04/25/2016   Stage 1 decubitus ulcer/LOW THORACIC spine 04/25/2016   Physical deconditioning 04/25/2016   Fracture, humerus, proximal 04/10/2015   DYSURIA 10/08/2010   MITRAL REGURGITATION 04/13/2009   LEFT BUNDLE BRANCH BLOCK 04/13/2009    GERD 04/13/2009   Palpitations 04/13/2009   PEPTIC ULCER DISEASE, HX OF 04/13/2009   SVT/ PSVT/ PAT 03/26/2009    Orientation RESPIRATION BLADDER Height & Weight     Self, Time, Situation, Place  Normal Incontinent Weight: 143 lb 12.8 oz (65.2 kg) Height:  5' 7.01" (170.2 cm)  BEHAVIORAL SYMPTOMS/MOOD NEUROLOGICAL BOWEL NUTRITION STATUS      Continent Diet (see discharge summary)  AMBULATORY STATUS COMMUNICATION OF NEEDS Skin   Total Care Verbally Surgical wounds, Skin abrasions                       Personal Care Assistance Level of Assistance  Bathing, Feeding, Dressing Bathing Assistance: Maximum assistance Feeding assistance: Limited assistance Dressing Assistance: Maximum assistance     Functional Limitations Info  Sight, Hearing, Speech Sight Info: Adequate Hearing Info: Adequate Speech Info: Adequate    SPECIAL CARE FACTORS FREQUENCY  PT (By licensed PT), OT (By licensed OT)     PT Frequency: 5x week OT Frequency: 5x week            Contractures Contractures Info: Not present    Additional Factors Info  Code Status, Allergies Code Status Info: full Allergies Info: Aleve (Naproxen Sodium), Ambien (Zolpidem), Flexeril (Cyclobenzaprine), Adhesive (Tape), Aspirin           Current Medications (09/29/2023):  This is the current hospital active medication list Current Facility-Administered Medications  Medication Dose Route Frequency Provider Last Rate Last Admin   acetaminophen (TYLENOL) tablet 1,000 mg  1,000 mg Oral TID Marlin Canary U, DO   1,000 mg at 09/29/23 1040   ALPRAZolam (  Prudy Feeler) tablet 0.25 mg  0.25 mg Oral BID PRN Samson Frederic, MD   0.25 mg at 09/28/23 2056   amiodarone (PACERONE) tablet 100 mg  100 mg Oral Daily Swinteck, Arlys John, MD   100 mg at 09/29/23 1042   buPROPion (WELLBUTRIN XL) 24 hr tablet 300 mg  300 mg Oral Daily Swinteck, Arlys John, MD   300 mg at 09/29/23 1043   carvedilol (COREG) tablet 12.5 mg  12.5 mg Oral BID Samson Frederic, MD   12.5 mg at 09/29/23 1040   docusate sodium (COLACE) capsule 100 mg  100 mg Oral BID Samson Frederic, MD   100 mg at 09/29/23 1040   fluticasone (FLONASE) 50 MCG/ACT nasal spray 2 spray  2 spray Each Nare Daily Swinteck, Arlys John, MD   2 spray at 09/29/23 1047   furosemide (LASIX) tablet 40 mg  40 mg Oral Daily Swinteck, Arlys John, MD   40 mg at 09/29/23 1041   HYDROcodone-acetaminophen (NORCO/VICODIN) 5-325 MG per tablet 1-2 tablet  1-2 tablet Oral Q6H PRN Samson Frederic, MD   1 tablet at 09/27/23 2229   HYDROmorphone (DILAUDID) injection 0.5 mg  0.5 mg Intravenous Q3H PRN Swinteck, Arlys John, MD       menthol-cetylpyridinium (CEPACOL) lozenge 3 mg  1 lozenge Oral PRN Swinteck, Arlys John, MD       Or   phenol (CHLORASEPTIC) mouth spray 1 spray  1 spray Mouth/Throat PRN Swinteck, Arlys John, MD       methocarbamol (ROBAXIN) tablet 500 mg  500 mg Oral Q6H PRN Samson Frederic, MD   500 mg at 09/28/23 2056   Or   methocarbamol (ROBAXIN) injection 500 mg  500 mg Intravenous Q6H PRN Swinteck, Arlys John, MD       metoCLOPramide (REGLAN) tablet 5-10 mg  5-10 mg Oral Q8H PRN Swinteck, Arlys John, MD       Or   metoCLOPramide (REGLAN) injection 5-10 mg  5-10 mg Intravenous Q8H PRN Swinteck, Arlys John, MD       mirtazapine (REMERON) tablet 7.5 mg  7.5 mg Oral QHS Swinteck, Arlys John, MD   7.5 mg at 09/28/23 2058   ondansetron (ZOFRAN) tablet 4 mg  4 mg Oral Q6H PRN Samson Frederic, MD   4 mg at 09/27/23 2042   Or   ondansetron (ZOFRAN) injection 4 mg  4 mg Intravenous Q6H PRN Swinteck, Arlys John, MD       pantoprazole (PROTONIX) EC tablet 40 mg  40 mg Oral q morning Swinteck, Arlys John, MD   40 mg at 09/29/23 1042   polyethylene glycol (MIRALAX / GLYCOLAX) packet 17 g  17 g Oral Daily PRN Swinteck, Arlys John, MD       polyvinyl alcohol (LIQUIFILM TEARS) 1.4 % ophthalmic solution 1 drop  1 drop Both Eyes PRN Swinteck, Arlys John, MD       potassium chloride (KLOR-CON M) CR tablet 10 mEq  10 mEq Oral BID Samson Frederic, MD   10 mEq at 09/29/23  1109   Rivaroxaban (XARELTO) tablet 15 mg  15 mg Oral Daily Vann, Jessica U, DO   15 mg at 09/29/23 1100   senna (SENOKOT) tablet 8.6 mg  1 tablet Oral BID Samson Frederic, MD   8.6 mg at 09/29/23 1042   sodium chloride (OCEAN) 0.65 % nasal spray 2 spray  2 spray Each Nare Daily PRN Samson Frederic, MD         Discharge Medications: Please see discharge summary for a list of discharge medications.  Relevant Imaging Results:  Relevant Lab Results:  Additional Information SSN: [id number]  Lorri Frederick, LCSW

## 2023-09-29 NOTE — Progress Notes (Signed)
Occupational Therapy Treatment Patient Details Name: Yvonne [name] MRN: [id number] DOB: [birth year] Today's Date: 09/29/2023   History of present illness 89 y.o. female [facility] after falling. Pt w/ R femoral neck fx s/p R hip hemiarthroplasty, anterior approach on 11/30. PMHx: complete heart block s/p PM, PAF on chronic anticoagulation, HFrEF, HTN, anemia, arthritis, GERD   OT comments  Patient supine in bed and agreeable to OT.  Patient completing bed mobility with mod assist +2, transfers with mod assist +2 fading to min assist +2 using RW provided cueing for hand placement and technique.  Pt incontinent of bladder with soiled mesh underwear, therefore requires max assist for toileting and underwear change but reviewed compensatory techniques with patient. She fatigues after several stands and requests to return back to bed, but is able to simulate stepping towards HOB, forward and backward with RW.  Some confusion noted, but pt is able to state she is at the hospital.  Patient will benefit from skilled OT acutely to increase independence and safety with ADLS to allow discharge skilled inpatient follow up therapy, <3 hours/day at facility if possible (respite care options).       If plan is discharge home, recommend the following:  A lot of help with walking and/or transfers;A lot of help with bathing/dressing/bathroom   Equipment Recommendations  BSC/3in1    Recommendations for Other Services      Precautions / Restrictions Precautions Precautions: Fall Restrictions Weight Bearing Restrictions: Yes RLE Weight Bearing: Weight bearing as tolerated Other Position/Activity Restrictions: no hip precautions       Mobility Bed Mobility Overal bed mobility: Needs Assistance Bed Mobility: Supine to Sit, Sit to Supine, Rolling Rolling: Min assist, Used rails   Supine to sit: Mod assist, +2 for physical assistance Sit to supine: Mod assist, +2 for safety/equipment    General bed mobility comments: pt requires cueing for technique and mod assist +2 to manage hips, R LE and trunk support to EOB.  REturned to supine with support of LEs.  ROlling to change bed pad with min assist.    Transfers Overall transfer level: Needs assistance Equipment used: Rolling walker (2 wheels) Transfers: Sit to/from Stand Sit to Stand: Mod assist, Min assist, +2 physical assistance           General transfer comment: mod assist +2 to power up and steady from elevated EOB, fading to min assist +2 with repeated practice. Requires cueing for hand placement and technique.     Balance Overall balance assessment: Needs assistance Sitting-balance support: No upper extremity supported, Feet supported Sitting balance-Leahy Scale: Fair Sitting balance - Comments: min guard to close supervision   Standing balance support: Bilateral upper extremity supported, During functional activity Standing balance-Leahy Scale: Poor Standing balance comment: reliant on RW                           ADL either performed or assessed with clinical judgement   ADL Overall ADL's : Needs assistance/impaired                     Lower Body Dressing: Maximal assistance;Sit to/from stand Lower Body Dressing Details (indicate cue type and reason): to change soiled underwear Toilet Transfer: Ambulation;Minimal assistance;+2 for physical assistance;Rolling walker (2 wheels)   Toileting- Clothing Manipulation and Hygiene: Maximal assistance;+2 for safety/equipment;Sit to/from stand Toileting - Clothing Manipulation Details (indicate cue type and reason): requires assist for clothing mgmt  Functional mobility during ADLs: Moderate assistance;Minimal assistance;+2 for physical assistance;Rolling walker (2 wheels);Cueing for sequencing;Cueing for safety General ADL Comments: pt stood at EOB and took several steps forward, backwards and to [facility] but declined transferring to the  chair. Purewick in place during all mobilization due to incontience    Extremity/Trunk Assessment Upper Extremity Assessment Upper Extremity Assessment: Generalized weakness            Vision       Perception     Praxis      Cognition Arousal: Alert Behavior During Therapy: Anxious Overall Cognitive Status: History of cognitive impairments - at baseline                                 General Comments: pt anxious with mobility but overall engaging well.  She is verbose and requires redirection at times.  questionable orientation as pt voicies needing to change her own sheets at [facility] but when questioned where she is states "I know i'm at the hospital".        Exercises      Shoulder Instructions       General Comments VSS on RA, SPo2 97%; incision dressing intact    Pertinent Vitals/ Pain       Pain Assessment Pain Assessment: Faces Faces Pain Scale: Hurts little more Pain Location: R hip Pain Descriptors / Indicators: Discomfort, Grimacing, Guarding Pain Intervention(s): Limited activity within patient's tolerance, Monitored during session, Repositioned  Home Living                                          Prior Functioning/Environment              Frequency  Min 1X/week        Progress Toward Goals  OT Goals(current goals can now be found in the care plan section)  Progress towards OT goals: Progressing toward goals  Acute Rehab OT Goals Time For Goal Achievement: 10/12/23 Potential to Achieve Goals: Good  Plan      Co-evaluation                 AM-PAC OT "6 Clicks" Daily Activity     Outcome Measure   Help from another person eating meals?: None Help from another person taking care of personal grooming?: A Little Help from another person toileting, which includes using toliet, bedpan, or urinal?: A Lot Help from another person bathing (including washing, rinsing, drying)?: A Little Help  from another person to put on and taking off regular upper body clothing?: A Little Help from another person to put on and taking off regular lower body clothing?: A Lot 6 Click Score: 17    End of Session Equipment Utilized During Treatment: Gait belt;Rolling walker (2 wheels)  OT Visit Diagnosis: Unsteadiness on feet (R26.81);Muscle weakness (generalized) (M62.81)   Activity Tolerance Patient tolerated treatment well   Patient Left in bed;with call bell/phone within reach;with chair alarm set;with nursing/sitter in room   Nurse Communication Mobility status;Precautions        Time: 1010-1040 OT Time Calculation (min): 30 min  Charges: OT General Charges $OT Visit: 1 Visit OT Treatments $Self Care/Home Management : 23-37 mins  Barry Brunner, OT Acute Rehabilitation Services Office [phone]   Yvonne [name] 09/29/2023, 11:01 AM

## 2023-09-29 NOTE — Progress Notes (Signed)
PROGRESS NOTE    [name]  ZOX:[id number] DOB: [birth year] DOA: 09/25/2023 PCP: Thana Ates, MD    Brief Narrative:  Yvonne [name] is a 89 y.o. female with medical history significant of paroxysmal atrial fibrillation on chronic anticoagulation, congestive heart failure, complete heart block s/p Saint Jude pacemaker, GI bleed, GERD, and arthritis who presents after having a fall.  At baseline she has been living independently in an assisted living facility called [facility] for the last 7 years. The incident occurred when the patient was moving from the bedroom to the hall, intending to take bottled water to the kitchen when she fell.  She was using her rollator at the time. As a result of the fall, the patient is experiencing severe pain in the hip area.   Assessment and Plan: Right femoral neck fracture secondary to fall Acute.  Patient normally ambulates with the use of a rollator and had a mechanical fall landing on her right side.  Found to have a right femoral neck fracture with varus impaction. -s/p repair on 11/30 -pain regimen-- scheduled and PRN   Complete heart block s/p PM Paroxysmal atrial fibrillation on chronic anticoagulation Patient appears to be rate controlled at this time.  Last dose of Xarelto was yesterday evening around 6 PM.  Patient has a [employer] dual pacemaker implanted in 08/2017 for intermittent complete heart block. -Hold Xarelto -Continue Coreg and amiodarone   Heart failure with reduced EF  Chronic.  Patient appears to be euvolemic on physical exam at this time.  Last echocardiogram noted EF to be 40 to 45% with grade 1 diastolic dysfunction back in 2017.CHA2DS2-VASc score equal to at least 4. -Strict I&Os and daily weights -Continue Coreg and furosemide -resume xarelto   Essential hypertension Blood pressures currently maintained. -Continue home blood pressure regimen   Normocytic anemia Hemoglobin trending down -expected post op blood  loss -CBC in AM   Anxiety -Continue Xanax as needed and Wellbutrin   Urinary urgency -U/A never collected    GERD -Continue Protonix  Hypokalemia -replete  DVT prophylaxis: SCDs Start: 09/27/23 1140 Rivaroxaban (XARELTO) tablet 15 mg    Code Status: Full Code   Disposition Plan:  Level of care: Telemetry Surgical Status is: Inpatient Remains inpatient appropriate    Consultants:  ortho  Subjective: Would prefer to go back to ALF if able  Objective: Vitals:   09/28/23 1430 09/28/23 1953 09/29/23 0436 09/29/23 0806  BP: (!) 101/55 (!) 114/54 (!) 129/58 (!) 125/55  Pulse: 82 77 83 84  Resp: 18 18 18 18   Temp: 97.8 F (36.6 C) (!) 97.4 F (36.3 C) (!) 97.5 F (36.4 C) 97.6 F (36.4 C)  TempSrc: Oral Oral Oral Oral  SpO2: 94% 92% 92% 92%  Weight:      Height:        Intake/Output Summary (Last 24 hours) at 09/29/2023 1033 Last data filed at 09/29/2023 0600 Gross per 24 hour  Intake --  Output 500 ml  Net -500 ml   Filed Weights   09/25/23 0447 09/27/23 0717  Weight: 65.2 kg 65.2 kg    Examination:    General: Appearance:    Well developed, well nourished female in no acute distress     Lungs:    respirations unlabored  Heart:    Normal heart rate.      MS:   All extremities are intact.   Neurologic:   Awake, alert      Data Reviewed: I have  personally reviewed following labs and imaging studies  CBC: Recent Labs  Lab 09/25/23 0438 09/26/23 0612 09/27/23 0336 09/28/23 0724 09/29/23 0734  WBC 7.7 11.3* 11.9* 17.4* 9.5  NEUTROABS 5.6  --   --   --   --   HGB 10.9* 10.6* 11.2* 9.0* 8.7*  HCT 34.9* 33.6* 35.0* 27.8* 27.6*  MCV 93.8 90.3 90.2 90.3 92.3  PLT 271 248 258 231 230   Basic Metabolic Panel: Recent Labs  Lab 09/25/23 0438 09/26/23 0612 09/27/23 0336 09/28/23 0724 09/29/23 0734  NA 139 135 136 137 138  K 3.7 3.5 3.8 3.5 3.3*  CL 110 100 102 102 103  CO2 23 25 25 23 23   GLUCOSE 100* 92 120* 111* 90  BUN 35* 26* 28*  40* 43*  CREATININE 1.06* 1.11* 1.18* 1.22* 1.20*  CALCIUM 8.9 9.1 9.0 9.0 8.5*  MG 2.6*  --   --   --   --    GFR: Estimated Creatinine Clearance: 29.1 mL/min (A) (by C-G formula based on SCr of 1.2 mg/dL (H)). Liver Function Tests: Recent Labs  Lab 09/25/23 0438  AST 26  ALT 20  ALKPHOS 78  BILITOT 0.3  PROT 6.6  ALBUMIN 3.7   No results for input(s): "LIPASE", "AMYLASE" in the last 168 hours. No results for input(s): "AMMONIA" in the last 168 hours. Coagulation Profile: Recent Labs  Lab 09/25/23 0438  INR 2.0*   Cardiac Enzymes: No results for input(s): "CKTOTAL", "CKMB", "CKMBINDEX", "TROPONINI" in the last 168 hours. BNP (last 3 results) No results for input(s): "PROBNP" in the last 8760 hours. HbA1C: No results for input(s): "HGBA1C" in the last 72 hours. CBG: No results for input(s): "GLUCAP" in the last 168 hours. Lipid Profile: No results for input(s): "CHOL", "HDL", "LDLCALC", "TRIG", "CHOLHDL", "LDLDIRECT" in the last 72 hours. Thyroid Function Tests: No results for input(s): "TSH", "T4TOTAL", "FREET4", "T3FREE", "THYROIDAB" in the last 72 hours. Anemia Panel: No results for input(s): "VITAMINB12", "FOLATE", "FERRITIN", "TIBC", "IRON", "RETICCTPCT" in the last 72 hours. Sepsis Labs: No results for input(s): "PROCALCITON", "LATICACIDVEN" in the last 168 hours.  Recent Results (from the past 240 hour(s))  Surgical pcr screen     Status: None   Collection Time: 09/26/23  9:30 PM   Specimen: Nasal Mucosa; Nasal Swab  Result Value Ref Range Status   MRSA, PCR NEGATIVE NEGATIVE Final   Staphylococcus aureus NEGATIVE NEGATIVE Final    Comment: (NOTE) The Xpert SA Assay (FDA approved for NASAL specimens in patients 22 years of age and older), is one component of a comprehensive surveillance program. It is not intended to diagnose infection nor to guide or monitor treatment. Performed at [facility], [address], [place], [place] [address]           Radiology Studies: No results found.      Scheduled Meds:  acetaminophen  1,000 mg Oral TID   amiodarone  100 mg Oral Daily   buPROPion  300 mg Oral Daily   carvedilol  12.5 mg Oral BID   docusate sodium  100 mg Oral BID   fluticasone  2 spray Each Nare Daily   furosemide  40 mg Oral Daily   mirtazapine  7.5 mg Oral QHS   pantoprazole  40 mg Oral q morning   potassium chloride  10 mEq Oral BID   rivaroxaban  15 mg Oral Daily   senna  1 tablet Oral BID   Continuous Infusions:     LOS:  4 days    Time spent: 45 minutes spent on chart review, discussion with nursing staff, consultants, updating family and interview/physical exam; more than 50% of that time was spent in counseling and/or coordination of care.    Joseph Art, DO [facility] Available via Epic secure chat 7am-7pm After these hours, please refer to coverage provider listed on [email or web address] 09/29/2023, 10:33 AM

## 2023-09-29 NOTE — Progress Notes (Signed)
RE:  Yvonne [name]       Date of Birth:  08/05/2029     Date:   09/29/23       To Whom It May Concern:  Please be advised that the above-named patient will require a short-term nursing home stay - anticipated 30 days or less for rehabilitation and strengthening.  The plan is for return home.                 MD signature                Date

## 2023-09-29 NOTE — Progress Notes (Signed)
Nutrition Follow-up  DOCUMENTATION CODES:   Not applicable  INTERVENTION:   -Liberalize diet to regular due to age and underweight status.  -Time antiemetic for meal coverage.  -Provide Ensure Enlive po BID, each supplement provides 350 kcal and 20 grams of protein, to aide in meeting estimated needs.  -MVI/Minerals-1 Tab daily.    NUTRITION DIAGNOSIS:   Increased nutrient needs related to hip fracture as evidenced by estimated needs.  -ongoing  GOAL:   Patient will meet greater than or equal to 90% of their needs  -ongoing  MONITOR:   PO intake, Supplement acceptance, Diet advancement, Labs  REASON FOR ASSESSMENT:   Follow up  ASSESSMENT:   89 y.o.f, that lives independently in an assisted living facility, presented with right hip pain after fall in home.  PMH: anxiety, arthritis, cancer, complete heart block s/p Saint Jude pacemaker, GI bleed, GERD, PAF.  11/30-right hip hemiarthroplasty due to displaced right femoral neck fracture  Intakes recorded only once, per RN today, she was eating fairly well, although currently patient is complaining of nausea which is being addressed with antemetic. She is not feeling well during visit, did not attempt nutrition hx at this time.   Patient informs that she has been unable to walk for some time.   Medications reviewed and include colace, lasix, remeron, PPI, potassium chloride 10 mEq 2x daily, senna, zofran.  Labs: potassium 3.3       NUTRITION - FOCUSED PHYSICAL EXAM:   Flowsheet Row Most Recent Value  Orbital Region Moderate depletion  Upper Arm Region No depletion  Thoracic and Lumbar Region No depletion  Buccal Region No depletion  Temple Region Moderate depletion  Clavicle Bone Region Moderate depletion  Clavicle and Acromion Bone Region Moderate depletion  Scapular Bone Region Mild depletion  Dorsal Hand Moderate depletion  Patellar Region Mild depletion  Anterior Thigh Region Moderate depletion   Posterior Calf Region Moderate depletion  Edema (RD Assessment) None  Hair Reviewed  [recent hair loss past few months per patient report]  Eyes Reviewed  Mouth Reviewed  Skin Reviewed  Nails Reviewed       Diet Order:   Diet Order             Diet Heart Room service appropriate? Yes; Fluid consistency: Thin  Diet effective now                   EDUCATION NEEDS:   Not appropriate for education at this time  Skin:  Skin Assessment: Skin Integrity Issues: Skin Integrity Issues:: Incisions Incisions: R hip  Last BM:  09/29/23  Height:   Ht Readings from Last 1 Encounters:  09/27/23 5' 7.01" (1.702 m)    Weight:   Wt Readings from Last 1 Encounters:  09/27/23 65.2 kg    Ideal Body Weight:     BMI:  Body mass index is 22.52 kg/m.  Estimated Nutritional Needs:   Kcal:  2000-2300 kcal/d  Protein:  85-100 g/d  Fluid:  37ml/kcal    Alvino Chapel, RDLD Clinical Dietitian See AMION for contact information

## 2023-09-30 DIAGNOSIS — S72001A Fracture of unspecified part of neck of right femur, initial encounter for closed fracture: Secondary | ICD-10-CM | POA: Diagnosis not present

## 2023-09-30 LAB — CBC
HCT: 28.1 % — ABNORMAL LOW (ref 36.0–46.0)
Hemoglobin: 8.9 g/dL — ABNORMAL LOW (ref 12.0–15.0)
MCH: 29.5 pg (ref 26.0–34.0)
MCHC: 31.7 g/dL (ref 30.0–36.0)
MCV: 93 fL (ref 80.0–100.0)
Platelets: 283 10*3/uL (ref 150–400)
RBC: 3.02 MIL/uL — ABNORMAL LOW (ref 3.87–5.11)
RDW: 15.4 % (ref 11.5–15.5)
WBC: 8 10*3/uL (ref 4.0–10.5)
nRBC: 0 % (ref 0.0–0.2)

## 2023-09-30 LAB — BASIC METABOLIC PANEL
Anion gap: 9 (ref 5–15)
BUN: 40 mg/dL — ABNORMAL HIGH (ref 8–23)
CO2: 24 mmol/L (ref 22–32)
Calcium: 8.8 mg/dL — ABNORMAL LOW (ref 8.9–10.3)
Chloride: 105 mmol/L (ref 98–111)
Creatinine, Ser: 1.57 mg/dL — ABNORMAL HIGH (ref 0.44–1.00)
GFR, Estimated: 31 mL/min — ABNORMAL LOW (ref >60)
Glucose, Bld: 92 mg/dL (ref 70–99)
Potassium: 3.9 mmol/L (ref 3.5–5.1)
Sodium: 138 mmol/L (ref 135–145)

## 2023-09-30 MED ORDER — ALPRAZOLAM 0.25 MG PO TABS: 0.2500 mg | ORAL_TABLET | Freq: Two times a day (BID) | ORAL | 0 refills | Status: DC | PRN

## 2023-09-30 MED ORDER — POLYETHYLENE GLYCOL 3350 17 G PO PACK: 17.0000 g | PACK | Freq: Every day | ORAL | Status: AC | PRN

## 2023-09-30 MED ORDER — ACETAMINOPHEN 500 MG PO TABS: 1000.0000 mg | ORAL_TABLET | Freq: Three times a day (TID) | ORAL | Status: AC

## 2023-09-30 NOTE — Progress Notes (Signed)
Physical Therapy Treatment Patient Details Name: Yvonne [name] MRN: [id number] DOB: 09/19/1929 Today's Date: 09/30/2023   History of Present Illness 89 y.o. female presents to [facility] 09/25/23 after falling. Pt w/ R femoral neck fx s/p R hip hemiarthroplasty, anterior approach on 11/30. PMHx: complete heart block s/p PM, PAF on chronic anticoagulation, HFrEF, HTN, anemia, arthritis, GERD    PT Comments  Pt received in supine, agreeable to therapy session and with good participation and tolerance for supine/seated BLE exercises for ROM/strengthening and in transfer training. Pt needing up to maxA +1 for bed mobility and able to perform multiple standing trials from elevated bed height with up to modA +1 along with sidesteps along EOB using RW support. Pt with difficulty lifting RLE from floor for stepping and tending to drag/shuffle R foot with steps due to c/o pain/weakness. Pt bed placed in chair posture at end of session so she can drink water/eat snacks from table if she wishes and to promote improved pulmonary clearance. Pt will continue to benefit from skilled rehab in a post acute setting < 3 hours/day to maximize functional gains before returning home.     If plan is discharge home, recommend the following: A lot of help with walking and/or transfers;A lot of help with bathing/dressing/bathroom;Assistance with cooking/housework;Direct supervision/assist for medications management;Direct supervision/assist for financial management;Assist for transportation;Help with stairs or ramp for entrance   Can travel by private vehicle     No  Equipment Recommendations  None recommended by PT    Recommendations for Other Services       Precautions / Restrictions Precautions Precautions: Fall Precaution Comments: STM deficits Restrictions Weight Bearing Restrictions: Yes RLE Weight Bearing: Weight bearing as tolerated Other Position/Activity Restrictions: no hip precautions     Mobility  Bed  Mobility Overal bed mobility: Needs Assistance Bed Mobility: Supine to Sit, Sit to Supine, Rolling Rolling: Min assist, Used rails   Supine to sit: Mod assist, HOB elevated, Used rails Sit to supine: Max assist, Used rails   General bed mobility comments: pt requires cueing for technique and assist to manage hips, R LE and trunk support to R EOB.  Returned to supine via log roll to R side with LUE supported by rail to lower trunk and BLE support from PTA.    Transfers Overall transfer level: Needs assistance Equipment used: Rolling walker (2 wheels) Transfers: Sit to/from Stand Sit to Stand: Mod assist, From elevated surface   Step pivot transfers: Min assist, Mod assist, From elevated surface       General transfer comment: from elevated bed<>RW with initial modA x3 trials, pt sits impulsively after first two trials due to urinary incontinence (purewick intact and functioning while pt standing), then on third trial pt was able to take a few shuffled sidesteps toward [facility], able to lift LLE off floor but only able to shuffle/slide RLE due to pain. min/modA steadying and managing RW for sidesteps to simulate step pivot transfer.    Ambulation/Gait             Pre-gait activities: see transfer comments; shuffled sidesteps only     Stairs             Wheelchair Mobility     Tilt Bed    Modified Rankin (Stroke Patients Only)       Balance Overall balance assessment: Needs assistance Sitting-balance support: No upper extremity supported, Feet supported Sitting balance-Leahy Scale: Fair Sitting balance - Comments: min guard to close supervision   Standing  balance support: Bilateral upper extremity supported, During functional activity Standing balance-Leahy Scale: Poor Standing balance comment: reliant on RW, posterior bias                            Cognition Arousal: Alert Behavior During Therapy: Anxious Overall Cognitive Status: History of  cognitive impairments - at baseline                                 General Comments: pt anxious with mobility but overall engaging well.  She is verbose and requires redirection at times.  questionable orientation as pt intermittently refers to hospital in past tense. Following simple commands well.        Exercises Other Exercises Other Exercises: seated BLE AROM: LAQ x10 reps ea Other Exercises: supine BLE A/AAROM: ankle pumps, hip abduction (AA on R), heel slides (AA on R), quad sets x10 reps ea Other Exercises: STS x 4 trials for BLE strengthening and chair push-ups x3 reps from EOB as pre-transfer BUE strengthening activity (focusing on pushing with arms into squat)    General Comments General comments (skin integrity, edema, etc.): R hip/groin dressing appears c/d/i; purewick present and functioning; no c/o dizziness or nausea, BP stable from supine to sitting postures, HR 80's bpm with exertion.      Pertinent Vitals/Pain Pain Assessment Pain Assessment: Faces Faces Pain Scale: Hurts a little bit Pain Location: R hip while standing, no resting pain per pt report Pain Descriptors / Indicators: Discomfort, Grimacing, Guarding Pain Intervention(s): Limited activity within patient's tolerance, Monitored during session, Repositioned, Ice applied    Home Living                          Prior Function            PT Goals (current goals can now be found in the care plan section) Acute Rehab PT Goals Patient Stated Goal: to go home PT Goal Formulation: With patient Time For Goal Achievement: 10/12/23 Progress towards PT goals: Progressing toward goals    Frequency    Min 1X/week      PT Plan      Co-evaluation              AM-PAC PT "6 Clicks" Mobility   Outcome Measure  Help needed turning from your back to your side while in a flat bed without using bedrails?: A Lot Help needed moving from lying on your back to sitting on the side  of a flat bed without using bedrails?: A Lot Help needed moving to and from a bed to a chair (including a wheelchair)?: A Lot Help needed standing up from a chair using your arms (e.g., wheelchair or bedside chair)?: A Lot Help needed to walk in hospital room?: Total Help needed climbing 3-5 steps with a railing? : Total 6 Click Score: 10    End of Session Equipment Utilized During Treatment: Gait belt Activity Tolerance: Patient tolerated treatment well Patient left: in bed;with call bell/phone within reach;with bed alarm set;Other (comment) (bed in chair posture, pt awaiting PTAR) Nurse Communication: Mobility status PT Visit Diagnosis: Unsteadiness on feet (R26.81);History of falling (Z91.81);Muscle weakness (generalized) (M62.81)     Time: 4098-1191 PT Time Calculation (min) (ACUTE ONLY): 30 min  Charges:    $Therapeutic Exercise: 8-22 mins $Therapeutic Activity: 8-22 mins PT General Charges $$  ACUTE PT VISIT: 1 Visit                     Ashyla Luth P., PTA Acute Rehabilitation Services Secure Chat Preferred 9a-5:30pm Office: [phone]    Dorathy Kinsman [facility] 09/30/2023, 3:44 PM

## 2023-09-30 NOTE — TOC Progression Note (Addendum)
Transition of Care [facility]) - Progression Note    Patient Details  Name: JOANA SANDBOTHE MRN: [id number] Date of Birth: 07-Feb-1929  Transition of Care [facility]) CM/SW Contact  Lorri Frederick, LCSW Phone Number: 09/30/2023, 8:15 AM  Clinical Narrative:    Message from HTA: SNF approved, 7 days: [id number], PTAR approved: [phone].  They still need facility choice.  TC from son Nedra Hai requesting bed offers, which were provided.  They would like response from [facility], CSW reached out to [facility].   1000: [facility] does offer bed, son informed, he does want to accept.  [facility] can receive pt today.  Stephanie/HTA updated with facility choice.   Expected Discharge Plan: Skilled Nursing Facility Barriers to Discharge: Continued Medical Work up, SNF Pending bed offer  Expected Discharge Plan and Services In-house Referral: Clinical Social Work   Post Acute Care Choice: Skilled Nursing Facility Living arrangements for the past 2 months: Independent Living Facility ([facility])                                       Social Determinants of Health (SDOH) Interventions SDOH Screenings   Food Insecurity: No Food Insecurity (09/25/2023)  Housing: High Risk (09/25/2023)  Transportation Needs: No Transportation Needs (09/25/2023)  Utilities: Not At Risk (09/25/2023)  Tobacco Use: Low Risk  (09/27/2023)    Readmission Risk Interventions     No data to display

## 2023-09-30 NOTE — Progress Notes (Signed)
Report called to [facility].

## 2023-09-30 NOTE — TOC Transition Note (Signed)
Transition of Care [facility]) - CM/SW Discharge Note   Patient Details  Name: Yvonne [name] MRN: [id number] Date of Birth: January 29, 1929  [facility]) CM/SW Contact:  Lorri Frederick, LCSW Phone Number: 09/30/2023, 1:32 PM   Clinical Narrative:   Pt discharging to [facility], room 205.  RN call report to [phone].     Final next level of care: Skilled Nursing Facility Barriers to Discharge: Barriers Resolved   Patient Goals and CMS Choice CMS Medicare.gov Compare Post Acute Care list provided to:: Patient Choice offered to / list presented to : Patient, Adult Children (adult children: Ulyses Jarred)  Discharge Placement                Patient chooses bed at: [facility], [place] Patient to be transferred to facility by: [facility] Name of family member notified: son Nedra Hai Patient and family notified of of transfer: 09/30/23  Discharge Plan and Services Additional resources added to the After Visit Summary for   In-house Referral: Clinical Social Work   Post Acute Care Choice: Skilled Nursing Facility                               Social Determinants of Health (SDOH) Interventions SDOH Screenings   Food Insecurity: No Food Insecurity (09/25/2023)  Housing: High Risk (09/25/2023)  Transportation Needs: No Transportation Needs (09/25/2023)  Utilities: Not At Risk (09/25/2023)  Tobacco Use: Low Risk  (09/27/2023)     Readmission Risk Interventions     No data to display

## 2023-09-30 NOTE — Discharge Summary (Signed)
Physician Discharge Summary  [name] PIR:[id number] DOB: [birth year] DOA: 09/25/2023  PCP: Thana Ates, MD  Admit date: 09/25/2023 Discharge date: 09/30/2023  Admitted From: ILF Discharge disposition: SNF   Recommendations for Outpatient Follow-Up:   Cbc/BMP 1 week   Discharge Diagnosis:   Principal Problem:   Closed displaced fracture of right femoral neck [facility]) Active Problems:   Fall at home, initial encounter   Paroxysmal atrial fibrillation (HCC)   Complete heart block (HCC)   Pacemaker -    Chronic anticoagulation   Heart failure with reduced ejection fraction (HCC)   Essential hypertension   Normocytic anemia   Anxiety   Urinary urgency   GERD    Discharge Condition: Improved.  Diet recommendation: Regular.  Wound care: None.  Code status: Full.   History of Present Illness:   Yvonne [name] is a 89 y.o. female with medical history significant of paroxysmal atrial fibrillation on chronic anticoagulation, congestive heart failure, complete heart block s/p [employer] Jude pacemaker, GI bleed, GERD, and arthritis who presents after having a fall.  At baseline she has been living independently in an assisted living facility called [facility] for the last 7 years. The incident occurred when the patient was moving from the bedroom to the hall, intending to take bottled water to the kitchen when she fell.  She was using her rollator at the time. As a result of the fall, the patient is experiencing severe pain in the hip area, described as "hurting horrible".  She also sustained a laceration of the right palm that was sutured closed.  Denies any loss of consciousness, trauma to her head, palpitations, or chest pain symptoms.  She had recently received a tetanus shot and 2023.  The patient reports easily bleeding due to being on Xarelto.  Her last dose was taken yesterday evening around 6-7 PM.  Being here in the hospital she has received morphine for pain which  has helped some.  Additionally, the patient mentions she has a pure wick catheter in place since arrival, but feels unable to urinate despite having the urge.   In the emergency department patient was noted be afebrile, respirations 15-26, blood pressures elevated up to 165/107, and O2 saturations maintained 2 L nasal cannula oxygen.  Labs significant for hemoglobin 10.9, BUN 35, creatinine 1.06, and INR 2.  X-rays of the right hip noted an acute femoral neck fracture with varus impaction.  Chest x-ray noted no acute abnormality with pacemaker in place.  Patient was typed and screened for possible need of blood products.  Right hand laceration was repaired by the ED provider. [facility] had been consulted as patient only wanted Dr. Ophelia Charter to do a procedure.  However, he is on vacation for the next 4 days and not on-call so Dr. Everardo Pacific was consulted to evaluate.   Hospital Course by Problem:   Right femoral neck fracture secondary to fall Acute.  Patient normally ambulates with the use of a rollator and had a mechanical fall landing on her right side.  Found to have a right femoral neck fracture with varus impaction. -s/p repair on 11/30 -pain regimen-- scheduled and PRN   Complete heart block s/p PM Paroxysmal atrial fibrillation on chronic anticoagulation Patient appears to be rate controlled at this time.  Last dose of Xarelto was yesterday evening around 6 PM.  Patient has a [employer] dual pacemaker implanted in 08/2017 for intermittent complete heart block. -Hold Xarelto -Continue Coreg and amiodarone  Heart failure with reduced EF  Chronic.  Patient appears to be euvolemic on physical exam at this time.  Last echocardiogram noted EF to be 40 to 45% with grade 1 diastolic dysfunction back in 2017.CHA2DS2-VASc score equal to at least 4. -Continue Coreg and furosemide- with holding parameters -resume xarelto   Essential hypertension Blood pressures currently maintained. -Continue home blood  pressure regimen   Normocytic anemia Hemoglobin stable -expected post op blood loss    Anxiety -Continue Xanax as needed and Wellbutrin   Urinary urgency -U/A never collected -- no further issues   GERD -Continue Protonix   Hypokalemia -repleted    Medical Consultants:      Discharge Exam:   Vitals:   09/30/23 0531 09/30/23 0843  BP: (!) 144/73 (!) 100/55  Pulse: 84 80  Resp: 18 16  Temp: 97.7 F (36.5 C) 98 F (36.7 C)  SpO2: 92% 94%   Vitals:   09/29/23 1800 09/29/23 2037 09/30/23 0531 09/30/23 0843  BP:  115/70 (!) 144/73 (!) 100/55  Pulse:  86 84 80  Resp: 19 18 18 16   Temp:  98.5 F (36.9 C) 97.7 F (36.5 C) 98 F (36.7 C)  TempSrc:  Oral Oral Oral  SpO2:  92% 92% 94%  Weight:      Height:        General exam: Appears calm and comfortable.    The results of significant diagnostics from this hospitalization (including imaging, microbiology, ancillary and laboratory) are listed below for reference.     Procedures and Diagnostic Studies:   DG Knee Right Port  Result Date: 09/26/2023 CLINICAL DATA:  Closed displaced fracture right femoral neck. Fall. Evaluate for right knee fracture. EXAM: PORTABLE RIGHT KNEE - 1-2 VIEW COMPARISON:  Frontal and patellar sunrise views of the right knee 05/10/2020 FINDINGS: Mildly decreased bone mineralization. Mild-to-moderate lateral compartment joint space narrowing with mild peripheral osteophytosis, mildly advanced from 05/10/2020. Mild patellofemoral joint space narrowing and superior and inferior degenerative osteophytosis. Within the limitations of overlying clothing artifact, no joint effusion is seen. Mild-to-moderate atherosclerotic calcifications. IMPRESSION: 1. No acute fracture. 2. Mild-to-moderate lateral compartment and mild patellofemoral compartment osteoarthritis. Electronically Signed   By: Neita Garnet M.D.   On: 09/26/2023 08:11   DG Chest Port 1 View  Result Date: 09/25/2023 CLINICAL DATA:   89 year old female status post fall, right hip fracture. EXAM: PORTABLE CHEST 1 VIEW COMPARISON:  Chest radiographs 07/29/2022 and earlier. FINDINGS: Portable AP semi upright view at 0417 hours. Chronically tortuous and calcified thoracic aorta. Chronic left chest dual lead cardiac pacemaker. Stable cardiac size and mediastinal contours. Visualized tracheal air column is within normal limits. Allowing for portable technique the lungs are clear. Chronic proximal left humerus ORIF. No acute osseous abnormality identified. IMPRESSION: 1. No acute cardiopulmonary abnormality. 2. Chronic pacemaker, Aortic Atherosclerosis (ICD10-I70.0). Electronically Signed   By: Odessa Fleming M.D.   On: 09/25/2023 05:27   DG Hip Port Unilat W or Wo Pelvis 1 View Right  Result Date: 09/25/2023 CLINICAL DATA:  89 year old female status post fall on blood thinners. EXAM: DG HIP (WITH OR WITHOUT PELVIS) 1V PORT RIGHT COMPARISON:  CT Abdomen and Pelvis 06/23/2015. FINDINGS: Portable AP supine view at 0420 hours. Acute right femoral neck fracture with impaction, minor varus angulation. On all three views the intertrochanteric segment of the proximal right femur appears to remain intact. Right femoral head remains normally located. No superimposed fracture of the pelvis identified. Left femoral head appears normally located in grossly intact.  Nonobstructed visible bowel gas pattern. IMPRESSION: Acute right femoral neck fracture with varus impaction. Electronically Signed   By: Odessa Fleming M.D.   On: 09/25/2023 05:26     Labs:   Basic Metabolic Panel: Recent Labs  Lab 09/25/23 0438 09/26/23 0612 09/27/23 0336 09/28/23 0724 09/29/23 0734 09/30/23 0634  NA 139 135 136 137 138 138  K 3.7 3.5 3.8 3.5 3.3* 3.9  CL 110 100 102 102 103 105  CO2 23 25 25 23 23 24   GLUCOSE 100* 92 120* 111* 90 92  BUN 35* 26* 28* 40* 43* 40*  CREATININE 1.06* 1.11* 1.18* 1.22* 1.20* 1.57*  CALCIUM 8.9 9.1 9.0 9.0 8.5* 8.8*  MG 2.6*  --   --   --   --    --    GFR Estimated Creatinine Clearance: 22.2 mL/min (A) (by C-G formula based on SCr of 1.57 mg/dL (H)). Liver Function Tests: Recent Labs  Lab 09/25/23 0438  AST 26  ALT 20  ALKPHOS 78  BILITOT 0.3  PROT 6.6  ALBUMIN 3.7   No results for input(s): "LIPASE", "AMYLASE" in the last 168 hours. No results for input(s): "AMMONIA" in the last 168 hours. Coagulation profile Recent Labs  Lab 09/25/23 0438  INR 2.0*    CBC: Recent Labs  Lab 09/25/23 0438 09/26/23 0612 09/27/23 0336 09/28/23 0724 09/29/23 0734 09/30/23 0634  WBC 7.7 11.3* 11.9* 17.4* 9.5 8.0  NEUTROABS 5.6  --   --   --   --   --   HGB 10.9* 10.6* 11.2* 9.0* 8.7* 8.9*  HCT 34.9* 33.6* 35.0* 27.8* 27.6* 28.1*  MCV 93.8 90.3 90.2 90.3 92.3 93.0  PLT 271 248 258 231 230 283   Cardiac Enzymes: No results for input(s): "CKTOTAL", "CKMB", "CKMBINDEX", "TROPONINI" in the last 168 hours. BNP: Invalid input(s): "POCBNP" CBG: No results for input(s): "GLUCAP" in the last 168 hours. D-Dimer No results for input(s): "DDIMER" in the last 72 hours. Hgb A1c No results for input(s): "HGBA1C" in the last 72 hours. Lipid Profile No results for input(s): "CHOL", "HDL", "LDLCALC", "TRIG", "CHOLHDL", "LDLDIRECT" in the last 72 hours. Thyroid function studies No results for input(s): "TSH", "T4TOTAL", "T3FREE", "THYROIDAB" in the last 72 hours.  Invalid input(s): "FREET3" Anemia work up No results for input(s): "VITAMINB12", "FOLATE", "FERRITIN", "TIBC", "IRON", "RETICCTPCT" in the last 72 hours. Microbiology Recent Results (from the past 240 hour(s))  Surgical pcr screen     Status: None   Collection Time: 09/26/23  9:30 PM   Specimen: Nasal Mucosa; Nasal Swab  Result Value Ref Range Status   MRSA, PCR NEGATIVE NEGATIVE Final   Staphylococcus aureus NEGATIVE NEGATIVE Final    Comment: (NOTE) The Xpert SA Assay (FDA approved for NASAL specimens in patients 64 years of age and older), is one component of a  comprehensive surveillance program. It is not intended to diagnose infection nor to guide or monitor treatment. Performed at [facility], [address], [place], [place] [address]      Discharge Instructions:   Discharge Instructions     Diet general   Complete by: As directed    Increase activity slowly   Complete by: As directed       Allergies as of 09/30/2023       Reactions   Aleve [naproxen Sodium] Other (See Comments)   Gi bleed   Ambien [zolpidem] Other (See Comments)   Pt states "made me have crazy thoughts"   Flexeril [cyclobenzaprine] Other (  See Comments)   Urinary rentention   Adhesive [tape] Rash, Other (See Comments)   Skin redness   Aspirin Other (See Comments)   Told to avoid high doses due to history of stomach ulcer        Medication List     TAKE these medications    acetaminophen 500 MG tablet Commonly known as: TYLENOL Take 2 tablets (1,000 mg total) by mouth 3 (three) times daily. What changed:  how much to take when to take this reasons to take this   ALPRAZolam 0.25 MG tablet Commonly known as: XANAX Take 1 tablet (0.25 mg total) by mouth 2 (two) times daily as needed.   amiodarone 200 MG tablet Commonly known as: PACERONE TAKE ONE-HALF TABLET DAILY   buPROPion 300 MG 24 hr tablet Commonly known as: WELLBUTRIN XL Take 1 tablet by mouth daily.   carvedilol 12.5 MG tablet Commonly known as: COREG Take 1 tablet (12.5 mg total) by mouth 2 (two) times daily.   fluticasone 50 MCG/ACT nasal spray Commonly known as: FLONASE Place 2 sprays into both nostrils daily. 2 sprays each nostril at night   furosemide 20 MG tablet Commonly known as: LASIX TAKE TWO TABLETS BY MOUTH DAILY   HYDROcodone-acetaminophen 5-325 MG tablet Commonly known as: NORCO/VICODIN Take 1 tablet by mouth every 6 (six) hours as needed for moderate pain (pain score 4-6).   mirtazapine 7.5 MG tablet Commonly known as: REMERON Take 7.5 mg by mouth  at bedtime.   ondansetron 8 MG tablet Commonly known as: ZOFRAN Take 8 mg by mouth every 8 (eight) hours as needed for nausea or vomiting.   pantoprazole 40 MG tablet Commonly known as: PROTONIX Take 40 mg by mouth every morning.   polyethylene glycol 17 g packet Commonly known as: MIRALAX / GLYCOLAX Take 17 g by mouth daily as needed for mild constipation.   potassium chloride 10 MEQ tablet Commonly known as: KLOR-CON Take 10 mEq by mouth 2 (two) times daily.   sodium chloride 0.65 % Soln nasal spray Commonly known as: OCEAN Place 2 sprays into both nostrils daily as needed for congestion.   SYSTANE COMPLETE OP Apply 1 drop to eye as needed.   Xarelto 15 MG Tabs tablet Generic drug: Rivaroxaban TAKE ONE TABLET EVERY DAY with SUPPER        Follow-up Information     Clois Dupes, PA-C. Schedule an appointment as soon as possible for a visit in 2 week(s).   Specialty: Orthopedic Surgery Why: For suture removal, For wound re-check Contact information: [address] [place] [address] [phone]                  Time coordinating discharge: 45 min  Signed:  Joseph Art DO  [facility] 09/30/2023, 9:32 AM

## 2023-10-09 ENCOUNTER — Emergency Department
Admission: EM | Admit: 2023-10-09 | Discharge: 2023-10-09 | Disposition: A | Discharge status: Home / Self Care | Payer: PPO | Attending: Emergency Medicine | Admitting: Emergency Medicine

## 2023-10-09 ENCOUNTER — Other Ambulatory Visit: Payer: Self-pay

## 2023-10-09 ENCOUNTER — Encounter: Payer: Self-pay | Admitting: Emergency Medicine

## 2023-10-09 ENCOUNTER — Emergency Department: Payer: PPO

## 2023-10-09 DIAGNOSIS — N939 Abnormal uterine and vaginal bleeding, unspecified: Secondary | ICD-10-CM | POA: Insufficient documentation

## 2023-10-09 DIAGNOSIS — N289 Disorder of kidney and ureter, unspecified: Secondary | ICD-10-CM | POA: Diagnosis not present

## 2023-10-09 DIAGNOSIS — R319 Hematuria, unspecified: Secondary | ICD-10-CM

## 2023-10-09 DIAGNOSIS — N390 Urinary tract infection, site not specified: Secondary | ICD-10-CM | POA: Insufficient documentation

## 2023-10-09 DIAGNOSIS — N3090 Cystitis, unspecified without hematuria: Secondary | ICD-10-CM

## 2023-10-09 LAB — BASIC METABOLIC PANEL
Anion gap: 12 (ref 5–15)
BUN: 46 mg/dL — ABNORMAL HIGH (ref 8–23)
CO2: 21 mmol/L — ABNORMAL LOW (ref 22–32)
Calcium: 8.9 mg/dL (ref 8.9–10.3)
Chloride: 100 mmol/L (ref 98–111)
Creatinine, Ser: 1.73 mg/dL — ABNORMAL HIGH (ref 0.44–1.00)
GFR, Estimated: 27 mL/min — ABNORMAL LOW (ref >60)
Glucose, Bld: 125 mg/dL — ABNORMAL HIGH (ref 70–99)
Potassium: 3.8 mmol/L (ref 3.5–5.1)
Sodium: 133 mmol/L — ABNORMAL LOW (ref 135–145)

## 2023-10-09 LAB — URINALYSIS, COMPLETE (UACMP) WITH MICROSCOPIC
Bilirubin Urine: NEGATIVE
Glucose, UA: NEGATIVE mg/dL
Ketones, ur: NEGATIVE mg/dL
Nitrite: NEGATIVE
Protein, ur: 100 mg/dL — AB
RBC / HPF: >50 RBC/hpf (ref 0–5)
Specific Gravity, Urine: 1.017 (ref 1.005–1.030)
WBC, UA: >50 WBC/hpf (ref 0–5)
pH: 5 (ref 5.0–8.0)

## 2023-10-09 LAB — CBC
HCT: 31.7 % — ABNORMAL LOW (ref 36.0–46.0)
Hemoglobin: 10 g/dL — ABNORMAL LOW (ref 12.0–15.0)
MCH: 29.7 pg (ref 26.0–34.0)
MCHC: 31.5 g/dL (ref 30.0–36.0)
MCV: 94.1 fL (ref 80.0–100.0)
Platelets: 444 10*3/uL — ABNORMAL HIGH (ref 150–400)
RBC: 3.37 MIL/uL — ABNORMAL LOW (ref 3.87–5.11)
RDW: 15.1 % (ref 11.5–15.5)
WBC: 10.9 10*3/uL — ABNORMAL HIGH (ref 4.0–10.5)
nRBC: 0 % (ref 0.0–0.2)

## 2023-10-09 NOTE — ED Notes (Signed)
See triage note  Presents via EMS  from [facility]    end her in for vaginal bleeding   Per EMS this started a few days ago  Family at bedside

## 2023-10-09 NOTE — ED Triage Notes (Signed)
Refer to First Nurse note.

## 2023-10-09 NOTE — ED Notes (Signed)
Report called to [facility] called by [facility] prior to shift change.

## 2023-10-09 NOTE — ED Provider Triage Note (Signed)
Emergency Medicine Provider Triage Evaluation Note  Yvonne [name] , a 89 y.o. female  was evaluated in triage.  Pt complains of vaginal bleeding since thanksgiving. Reports occurs daily, small clots. Denies history of cancer. No pain. No dizziness.  Review of Systems  Positive: Vaginal bleeding  Negative: Abd pain  Physical Exam  Ht 5\' 7"  (1.702 m)   Wt 65.2 kg   BMI 22.52 kg/m  Gen:   Awake, no distress   Resp:  Normal effort  MSK:   Moves extremities without difficulty  Other:    Medical Decision Making  Medically screening exam initiated at 12:22 PM.  Appropriate orders placed.  Yvonne [name] was informed that the remainder of the evaluation will be completed by another provider, this initial triage assessment does not replace that evaluation, and the importance of remaining in the ED until their evaluation is complete.     Jackelyn Hoehn, PA-C 10/09/23 1223

## 2023-10-09 NOTE — Discharge Instructions (Addendum)
Please continue to take your antibiotics as prescribed by your physician for the urinary tract infection.  We have sent a urine culture please have your physician check the results of this urine culture in 1 to 2 days to ensure that your current antibiotic is sufficient to treat this infection.  Return to the emergency department for any fever, weakness, confusion or any other worsening symptoms.

## 2023-10-09 NOTE — ED Notes (Signed)
First Nurse Note, pt arrived via [facility] from [facility] due to a hip injury and there for rehab. Pt has been having vaginal bleeding for the last several days. Pt has been prescribed cipro for a UTI. Pt is on blood thinners. Pt is A/Ox4. 20g in LAC. VS WNL per EMS.

## 2023-10-09 NOTE — ED Notes (Signed)
[facility] CALLED  FOR  TRANSPORT  BACK TO  [facility]

## 2023-10-09 NOTE — ED Provider Notes (Signed)
[facility] Provider Note    Event Date/Time   First MD Initiated Contact with Patient 10/09/23 1533     (approximate)  History   Chief Complaint: Vaginal Bleeding  HPI  Yvonne [name] is a 89 y.o. female with a past medical history of anxiety, paroxysmal atrial fibrillation currently off of Eliquis as her PCP recently stopped it, presents to the emergency department for vaginal bleeding.  According to the patient for the past 3 to 4 days now she has been experiencing vaginal bleeding.  Patient states she is status post hysterectomy many years ago.  Patient denies any abdominal pain denies any pelvic pain.  Patient states she is currently being treated for urinary tract infection just started treatment 2 days ago with IM Rocephin injections at her care facility.  Physical Exam   Triage Vital Signs: ED Triage Vitals  Encounter Vitals Group     BP 10/09/23 1223 97/60     Systolic BP Percentile --      Diastolic BP Percentile --      Pulse Rate 10/09/23 1223 72     Resp 10/09/23 1223 18     Temp 10/09/23 1223 98.3 F (36.8 C)     Temp Source 10/09/23 1223 Oral     SpO2 10/09/23 1223 96 %     Weight 10/09/23 1216 143 lb 12.8 oz (65.2 kg)     Height 10/09/23 1216 5\' 7"  (1.702 m)     Head Circumference --      Peak Flow --      Pain Score 10/09/23 1216 3     Pain Loc --      Pain Education --      Exclude from Growth Chart --     Most recent vital signs: Vitals:   10/09/23 1223 10/09/23 1632  BP: 97/60 102/62  Pulse: 72 70  Resp: 18 18  Temp: 98.3 F (36.8 C) 98 F (36.7 C)  SpO2: 96% 96%    General: Awake, no distress.  CV:  Good peripheral perfusion.  Regular rate and rhythm  Resp:  Normal effort.  Equal breath sounds bilaterally.  Abd:  No distention.  Soft, nontender.  No rebound or guarding.  ED Results / Procedures / Treatments   RADIOLOGY  Pelvic ultrasound shows status post hysterectomy no adnexal masses.   MEDICATIONS ORDERED  IN ED: Medications - No data to display   IMPRESSION / MDM / ASSESSMENT AND PLAN / ED COURSE  I reviewed the triage vital signs and the nursing notes.  Patient's presentation is most consistent with acute presentation with potential threat to life or bodily function.  Patient presents emergency department for 3 days of vaginal bleeding.  Patient states she believes it is vaginal bleeding but she is not positive.  Patient's status post hysterectomy.  Denies any vaginal pain denies any pelvic pain or abdominal pain.  Patient's workup today shows a reassuring CBC hemoglobin is actually increased compared to baseline.  Patient's chemistry shows mild renal insufficiency slightly worse than baseline.  Patient's urinalysis shows greater than 50 red cells as well as white cells with white blood cell clumps consistent with urinary tract infection which the patient is currently on treatment for.  We will send a urine culture.  Highly suspect that the patient's bleeding that she is seeing is likely urinary and not vaginal however we will perform a pelvic exam to evaluate.  On pelvic exam I do not see any blood within  the vaginal canal.  Patient's urinalysis has resulted greater than 50 red cells and white cells as well as white blood cell clumps consistent with urinary tract infection highly suspect the blood that they have seen on the patient's brief is due to the urine and not vaginal bleeding itself.  Reassuringly patient is also status post hysterectomy.  Given otherwise reassuring workup I believe the patient is safe for discharge home.  Will make it very clear in her discharge instructions that the care facility physician should check our urine culture in 1 to 2 days to ensure that Rocephin will adequately treat this infection.  However as the patient has only had 2 doses including 1 prior to arrival I do not believe there is been adequate time elapsed to see if Rocephin is effective  FINAL CLINICAL  IMPRESSION(S) / ED DIAGNOSES   Hematuria Urinary tract infection    Note:  This document was prepared using Dragon voice recognition software and may include unintentional dictation errors.   Minna Antis, MD 10/09/23 1725

## 2023-10-10 LAB — URINE CULTURE: Culture: <10000 — AB

## 2023-10-17 ENCOUNTER — Ambulatory Visit: Payer: Self-pay | Admitting: Student

## 2023-12-02 DIAGNOSIS — Z131 Encounter for screening for diabetes mellitus: Secondary | ICD-10-CM | POA: Diagnosis not present

## 2023-12-02 DIAGNOSIS — E785 Hyperlipidemia, unspecified: Secondary | ICD-10-CM | POA: Diagnosis not present

## 2023-12-02 DIAGNOSIS — I10 Essential (primary) hypertension: Secondary | ICD-10-CM | POA: Diagnosis not present

## 2023-12-25 ENCOUNTER — Telehealth: Payer: Self-pay | Admitting: Internal Medicine

## 2023-12-25 DIAGNOSIS — I480 Paroxysmal atrial fibrillation: Secondary | ICD-10-CM | POA: Diagnosis not present

## 2023-12-25 DIAGNOSIS — M1990 Unspecified osteoarthritis, unspecified site: Secondary | ICD-10-CM | POA: Diagnosis not present

## 2023-12-25 NOTE — Telephone Encounter (Signed)
  Son is calling stating that the patient is now at [facility] and is now under their care. He needs a letter from Dr Graciela Husbands that states she won't be followed by Dr Graciela Husbands going forward so that [facility] will be caring for her.  Please email letter to [email or web address]   [facility] [email or web address]

## 2023-12-29 ENCOUNTER — Telehealth: Payer: Self-pay | Admitting: Internal Medicine

## 2023-12-29 NOTE — Telephone Encounter (Signed)
 Attempted phone call to pt's son, DPR and left voicemail message to contact [phone].

## 2023-12-29 NOTE — Telephone Encounter (Signed)
 This has been addressed in a separate encounter.  Please see that encounter for complete details.

## 2023-12-29 NOTE — Telephone Encounter (Signed)
 Patient's son Nedra Hai) returned RN's call.

## 2023-12-29 NOTE — Telephone Encounter (Signed)
 Spoke with pt's son,DPR who reports pt now resides in Assisted Living.  Pt's son states [facility] will be managing her care and even her cardiac medications through facility.  Pt's son advised wit pt's pacemaker she will continue to need follow up through [facility] and Dr Graciela Husbands.  Pt's son states pt is no longer able to be taken out of facility due to her frail nature after breaking her hip.  Discussed possibility of scheduling telephone visits with pt and pt's son.  Pt's son states he would not be able to do this even with advance notice as he works Monday-Friday 8am-630pm.  Pt's son advised will discuss further with Dr Graciela Husbands.  Pt's PPM battery currently shows 4.6 years remaining.  Pt's son verbalizes understanding and thanked Charity fundraiser for the call.

## 2023-12-30 DIAGNOSIS — S72041S Displaced fracture of base of neck of right femur, sequela: Secondary | ICD-10-CM | POA: Diagnosis not present

## 2023-12-30 DIAGNOSIS — Z9181 History of falling: Secondary | ICD-10-CM | POA: Diagnosis not present

## 2023-12-30 DIAGNOSIS — R2689 Other abnormalities of gait and mobility: Secondary | ICD-10-CM | POA: Diagnosis not present

## 2023-12-30 DIAGNOSIS — M62551 Muscle wasting and atrophy, not elsewhere classified, right thigh: Secondary | ICD-10-CM | POA: Diagnosis not present

## 2023-12-30 DIAGNOSIS — M5459 Other low back pain: Secondary | ICD-10-CM | POA: Diagnosis not present

## 2023-12-30 DIAGNOSIS — M62552 Muscle wasting and atrophy, not elsewhere classified, left thigh: Secondary | ICD-10-CM | POA: Diagnosis not present

## 2023-12-30 NOTE — Telephone Encounter (Signed)
 If the patient cannot be transported, her device is reliable enough that we will almost certainly serve its 4+ years of battery that is anticipated.  From what I understand, she would not be a candidate for repeat procedure so at that juncture, if the battery were to stop it may be a way that she would pass on naturally.  We are certainly available to help in any way that we can we can anticipate discontinuing ongoing monitoring of her pacemaker.

## 2024-01-02 DIAGNOSIS — M62552 Muscle wasting and atrophy, not elsewhere classified, left thigh: Secondary | ICD-10-CM | POA: Diagnosis not present

## 2024-01-02 DIAGNOSIS — R2689 Other abnormalities of gait and mobility: Secondary | ICD-10-CM | POA: Diagnosis not present

## 2024-01-02 DIAGNOSIS — M5459 Other low back pain: Secondary | ICD-10-CM | POA: Diagnosis not present

## 2024-01-02 DIAGNOSIS — M62551 Muscle wasting and atrophy, not elsewhere classified, right thigh: Secondary | ICD-10-CM | POA: Diagnosis not present

## 2024-01-02 DIAGNOSIS — Z9181 History of falling: Secondary | ICD-10-CM | POA: Diagnosis not present

## 2024-01-02 DIAGNOSIS — S72041S Displaced fracture of base of neck of right femur, sequela: Secondary | ICD-10-CM | POA: Diagnosis not present

## 2024-01-02 NOTE — Telephone Encounter (Signed)
 Attempted phone call to pt's son DPR to advise of  recommendation per Dr Graciela Husbands.  Left voicemail message to contact RN at [phone]

## 2024-01-05 DIAGNOSIS — M545 Low back pain, unspecified: Secondary | ICD-10-CM | POA: Diagnosis not present

## 2024-01-05 DIAGNOSIS — I480 Paroxysmal atrial fibrillation: Secondary | ICD-10-CM | POA: Diagnosis not present

## 2024-01-05 DIAGNOSIS — F5101 Primary insomnia: Secondary | ICD-10-CM | POA: Diagnosis not present

## 2024-01-06 ENCOUNTER — Telehealth: Payer: Self-pay | Admitting: Internal Medicine

## 2024-01-06 NOTE — Telephone Encounter (Signed)
Duplicate encounter. Please see previous encounter.  

## 2024-01-06 NOTE — Telephone Encounter (Signed)
 Son Nedra Hai) returned RN's call regarding how to address patient pacemaker monitoring as patient is now in an assisted living facility.  See note dated 2/27.

## 2024-01-06 NOTE — Telephone Encounter (Signed)
 Spoke with son Dwana [facility] living and he is aware of provider statement regarding her device. He  verbalized understanding

## 2024-01-07 DIAGNOSIS — S72041S Displaced fracture of base of neck of right femur, sequela: Secondary | ICD-10-CM | POA: Diagnosis not present

## 2024-01-07 DIAGNOSIS — Z9181 History of falling: Secondary | ICD-10-CM | POA: Diagnosis not present

## 2024-01-07 DIAGNOSIS — M5459 Other low back pain: Secondary | ICD-10-CM | POA: Diagnosis not present

## 2024-01-07 DIAGNOSIS — M62552 Muscle wasting and atrophy, not elsewhere classified, left thigh: Secondary | ICD-10-CM | POA: Diagnosis not present

## 2024-01-07 DIAGNOSIS — R2689 Other abnormalities of gait and mobility: Secondary | ICD-10-CM | POA: Diagnosis not present

## 2024-01-07 DIAGNOSIS — M62551 Muscle wasting and atrophy, not elsewhere classified, right thigh: Secondary | ICD-10-CM | POA: Diagnosis not present

## 2024-01-12 DIAGNOSIS — S72041S Displaced fracture of base of neck of right femur, sequela: Secondary | ICD-10-CM | POA: Diagnosis not present

## 2024-01-12 DIAGNOSIS — M62552 Muscle wasting and atrophy, not elsewhere classified, left thigh: Secondary | ICD-10-CM | POA: Diagnosis not present

## 2024-01-12 DIAGNOSIS — Z9181 History of falling: Secondary | ICD-10-CM | POA: Diagnosis not present

## 2024-01-12 DIAGNOSIS — R2689 Other abnormalities of gait and mobility: Secondary | ICD-10-CM | POA: Diagnosis not present

## 2024-01-12 DIAGNOSIS — M5459 Other low back pain: Secondary | ICD-10-CM | POA: Diagnosis not present

## 2024-01-12 DIAGNOSIS — M62551 Muscle wasting and atrophy, not elsewhere classified, right thigh: Secondary | ICD-10-CM | POA: Diagnosis not present

## 2024-01-14 ENCOUNTER — Ambulatory Visit (INDEPENDENT_AMBULATORY_CARE_PROVIDER_SITE_OTHER): Payer: PPO

## 2024-01-14 DIAGNOSIS — I442 Atrioventricular block, complete: Secondary | ICD-10-CM | POA: Diagnosis not present

## 2024-01-15 LAB — CUP PACEART REMOTE DEVICE CHECK
Battery Remaining Longevity: 44 mo
Battery Remaining Percentage: 38 %
Battery Voltage: 2.98 V
Brady Statistic AP VP Percent: 3.9 %
Brady Statistic AP VS Percent: 1 %
Brady Statistic AS VP Percent: 96 %
Brady Statistic AS VS Percent: 1 %
Brady Statistic RA Percent Paced: 3.4 %
Brady Statistic RV Percent Paced: 99 %
Date Time Interrogation Session: 20250319042400
Implantable Lead Connection Status: 753985
Implantable Lead Connection Status: 753985
Implantable Lead Implant Date: 20181130
Implantable Lead Implant Date: 20181130
Implantable Lead Location: 753859
Implantable Lead Location: 753860
Implantable Pulse Generator Implant Date: 20181130
Lead Channel Impedance Value: 300 Ohm
Lead Channel Impedance Value: 480 Ohm
Lead Channel Pacing Threshold Amplitude: 0.75 V
Lead Channel Pacing Threshold Amplitude: 0.75 V
Lead Channel Pacing Threshold Pulse Width: 0.5 ms
Lead Channel Pacing Threshold Pulse Width: 0.5 ms
Lead Channel Sensing Intrinsic Amplitude: 2.8 mV
Lead Channel Sensing Intrinsic Amplitude: 5.1 mV
Lead Channel Setting Pacing Amplitude: 1 V
Lead Channel Setting Pacing Amplitude: 2 V
Lead Channel Setting Pacing Pulse Width: 0.5 ms
Lead Channel Setting Sensing Sensitivity: 2 mV
Pulse Gen Model: 2272
Pulse Gen Serial Number: 8969488

## 2024-01-19 DIAGNOSIS — K219 Gastro-esophageal reflux disease without esophagitis: Secondary | ICD-10-CM | POA: Diagnosis not present

## 2024-01-19 DIAGNOSIS — M545 Low back pain, unspecified: Secondary | ICD-10-CM | POA: Diagnosis not present

## 2024-01-19 DIAGNOSIS — F5101 Primary insomnia: Secondary | ICD-10-CM | POA: Diagnosis not present

## 2024-01-23 DIAGNOSIS — M1990 Unspecified osteoarthritis, unspecified site: Secondary | ICD-10-CM | POA: Diagnosis not present

## 2024-01-23 DIAGNOSIS — I10 Essential (primary) hypertension: Secondary | ICD-10-CM | POA: Diagnosis not present

## 2024-01-26 ENCOUNTER — Emergency Department

## 2024-01-26 ENCOUNTER — Inpatient Hospital Stay
Admission: EM | Admit: 2024-01-26 | Discharge: 2024-01-28 | DRG: 811 | Disposition: A | Discharge status: Hospice | Source: Skilled Nursing Facility | Attending: Internal Medicine | Admitting: Internal Medicine

## 2024-01-26 ENCOUNTER — Encounter: Payer: Self-pay | Admitting: Internal Medicine

## 2024-01-26 ENCOUNTER — Other Ambulatory Visit: Payer: Self-pay

## 2024-01-26 ENCOUNTER — Inpatient Hospital Stay

## 2024-01-26 DIAGNOSIS — T39395A Adverse effect of other nonsteroidal anti-inflammatory drugs [NSAID], initial encounter: Secondary | ICD-10-CM | POA: Diagnosis present

## 2024-01-26 DIAGNOSIS — I5042 Chronic combined systolic (congestive) and diastolic (congestive) heart failure: Secondary | ICD-10-CM | POA: Diagnosis present

## 2024-01-26 DIAGNOSIS — Z6822 Body mass index (BMI) 22.0-22.9, adult: Secondary | ICD-10-CM

## 2024-01-26 DIAGNOSIS — F419 Anxiety disorder, unspecified: Secondary | ICD-10-CM | POA: Diagnosis not present

## 2024-01-26 DIAGNOSIS — K219 Gastro-esophageal reflux disease without esophagitis: Secondary | ICD-10-CM | POA: Diagnosis present

## 2024-01-26 DIAGNOSIS — S066XAA Traumatic subarachnoid hemorrhage with loss of consciousness status unknown, initial encounter: Principal | ICD-10-CM

## 2024-01-26 DIAGNOSIS — N1831 Chronic kidney disease, stage 3a: Secondary | ICD-10-CM | POA: Diagnosis present

## 2024-01-26 DIAGNOSIS — D62 Acute posthemorrhagic anemia: Secondary | ICD-10-CM | POA: Diagnosis not present

## 2024-01-26 DIAGNOSIS — W1830XA Fall on same level, unspecified, initial encounter: Secondary | ICD-10-CM | POA: Diagnosis present

## 2024-01-26 DIAGNOSIS — I080 Rheumatic disorders of both mitral and aortic valves: Secondary | ICD-10-CM | POA: Diagnosis not present

## 2024-01-26 DIAGNOSIS — E46 Unspecified protein-calorie malnutrition: Secondary | ICD-10-CM | POA: Diagnosis present

## 2024-01-26 DIAGNOSIS — R000 Tachycardia, unspecified: Secondary | ICD-10-CM | POA: Diagnosis not present

## 2024-01-26 DIAGNOSIS — Z950 Presence of cardiac pacemaker: Secondary | ICD-10-CM

## 2024-01-26 DIAGNOSIS — E440 Moderate protein-calorie malnutrition: Secondary | ICD-10-CM | POA: Diagnosis not present

## 2024-01-26 DIAGNOSIS — Z8249 Family history of ischemic heart disease and other diseases of the circulatory system: Secondary | ICD-10-CM | POA: Diagnosis not present

## 2024-01-26 DIAGNOSIS — Z96641 Presence of right artificial hip joint: Secondary | ICD-10-CM | POA: Diagnosis present

## 2024-01-26 DIAGNOSIS — D649 Anemia, unspecified: Secondary | ICD-10-CM

## 2024-01-26 DIAGNOSIS — Y92129 Unspecified place in nursing home as the place of occurrence of the external cause: Secondary | ICD-10-CM | POA: Diagnosis not present

## 2024-01-26 DIAGNOSIS — S0990XA Unspecified injury of head, initial encounter: Secondary | ICD-10-CM | POA: Diagnosis not present

## 2024-01-26 DIAGNOSIS — E876 Hypokalemia: Secondary | ICD-10-CM | POA: Diagnosis not present

## 2024-01-26 DIAGNOSIS — I671 Cerebral aneurysm, nonruptured: Secondary | ICD-10-CM | POA: Diagnosis not present

## 2024-01-26 DIAGNOSIS — Z515 Encounter for palliative care: Secondary | ICD-10-CM

## 2024-01-26 DIAGNOSIS — K274 Chronic or unspecified peptic ulcer, site unspecified, with hemorrhage: Principal | ICD-10-CM | POA: Diagnosis present

## 2024-01-26 DIAGNOSIS — Z8711 Personal history of peptic ulcer disease: Secondary | ICD-10-CM

## 2024-01-26 DIAGNOSIS — I10 Essential (primary) hypertension: Secondary | ICD-10-CM | POA: Diagnosis present

## 2024-01-26 DIAGNOSIS — R296 Repeated falls: Secondary | ICD-10-CM

## 2024-01-26 DIAGNOSIS — M5030 Other cervical disc degeneration, unspecified cervical region: Secondary | ICD-10-CM | POA: Diagnosis not present

## 2024-01-26 DIAGNOSIS — W19XXXA Unspecified fall, initial encounter: Secondary | ICD-10-CM | POA: Diagnosis not present

## 2024-01-26 DIAGNOSIS — I5020 Unspecified systolic (congestive) heart failure: Secondary | ICD-10-CM | POA: Diagnosis present

## 2024-01-26 DIAGNOSIS — M1990 Unspecified osteoarthritis, unspecified site: Secondary | ICD-10-CM | POA: Diagnosis not present

## 2024-01-26 DIAGNOSIS — Z9181 History of falling: Secondary | ICD-10-CM | POA: Diagnosis not present

## 2024-01-26 DIAGNOSIS — J929 Pleural plaque without asbestos: Secondary | ICD-10-CM | POA: Diagnosis not present

## 2024-01-26 DIAGNOSIS — Z823 Family history of stroke: Secondary | ICD-10-CM

## 2024-01-26 DIAGNOSIS — K921 Melena: Secondary | ICD-10-CM | POA: Diagnosis present

## 2024-01-26 DIAGNOSIS — I959 Hypotension, unspecified: Secondary | ICD-10-CM | POA: Diagnosis not present

## 2024-01-26 DIAGNOSIS — R5381 Other malaise: Secondary | ICD-10-CM | POA: Diagnosis present

## 2024-01-26 DIAGNOSIS — I442 Atrioventricular block, complete: Secondary | ICD-10-CM | POA: Diagnosis present

## 2024-01-26 DIAGNOSIS — Z90710 Acquired absence of both cervix and uterus: Secondary | ICD-10-CM

## 2024-01-26 DIAGNOSIS — S066X0A Traumatic subarachnoid hemorrhage without loss of consciousness, initial encounter: Secondary | ICD-10-CM | POA: Diagnosis not present

## 2024-01-26 DIAGNOSIS — Z66 Do not resuscitate: Secondary | ICD-10-CM | POA: Diagnosis not present

## 2024-01-26 DIAGNOSIS — Z79899 Other long term (current) drug therapy: Secondary | ICD-10-CM

## 2024-01-26 DIAGNOSIS — I480 Paroxysmal atrial fibrillation: Secondary | ICD-10-CM | POA: Diagnosis present

## 2024-01-26 DIAGNOSIS — Z7901 Long term (current) use of anticoagulants: Secondary | ICD-10-CM

## 2024-01-26 DIAGNOSIS — I447 Left bundle-branch block, unspecified: Secondary | ICD-10-CM | POA: Diagnosis present

## 2024-01-26 DIAGNOSIS — I130 Hypertensive heart and chronic kidney disease with heart failure and stage 1 through stage 4 chronic kidney disease, or unspecified chronic kidney disease: Secondary | ICD-10-CM | POA: Diagnosis present

## 2024-01-26 DIAGNOSIS — I517 Cardiomegaly: Secondary | ICD-10-CM | POA: Diagnosis not present

## 2024-01-26 DIAGNOSIS — Z7401 Bed confinement status: Secondary | ICD-10-CM | POA: Diagnosis not present

## 2024-01-26 DIAGNOSIS — Y92009 Unspecified place in unspecified non-institutional (private) residence as the place of occurrence of the external cause: Secondary | ICD-10-CM

## 2024-01-26 DIAGNOSIS — I700 Atherosclerosis of aorta: Secondary | ICD-10-CM | POA: Diagnosis not present

## 2024-01-26 DIAGNOSIS — Z043 Encounter for examination and observation following other accident: Secondary | ICD-10-CM | POA: Diagnosis not present

## 2024-01-26 DIAGNOSIS — M545 Low back pain, unspecified: Secondary | ICD-10-CM | POA: Diagnosis not present

## 2024-01-26 DIAGNOSIS — M47812 Spondylosis without myelopathy or radiculopathy, cervical region: Secondary | ICD-10-CM | POA: Diagnosis not present

## 2024-01-26 DIAGNOSIS — M4312 Spondylolisthesis, cervical region: Secondary | ICD-10-CM | POA: Diagnosis not present

## 2024-01-26 DIAGNOSIS — Z7189 Other specified counseling: Secondary | ICD-10-CM | POA: Diagnosis not present

## 2024-01-26 LAB — URINALYSIS, ROUTINE W REFLEX MICROSCOPIC
Bilirubin Urine: NEGATIVE
Glucose, UA: NEGATIVE mg/dL
Hgb urine dipstick: NEGATIVE
Ketones, ur: NEGATIVE mg/dL
Nitrite: NEGATIVE
Protein, ur: NEGATIVE mg/dL
Specific Gravity, Urine: 1.009 (ref 1.005–1.030)
pH: 5 (ref 5.0–8.0)

## 2024-01-26 LAB — BASIC METABOLIC PANEL WITH GFR
Anion gap: 10 (ref 5–15)
BUN: 30 mg/dL — ABNORMAL HIGH (ref 8–23)
CO2: 21 mmol/L — ABNORMAL LOW (ref 22–32)
Calcium: 8.7 mg/dL — ABNORMAL LOW (ref 8.9–10.3)
Chloride: 108 mmol/L (ref 98–111)
Creatinine, Ser: 1.41 mg/dL — ABNORMAL HIGH (ref 0.44–1.00)
GFR, Estimated: 35 mL/min — ABNORMAL LOW (ref >60)
Glucose, Bld: 103 mg/dL — ABNORMAL HIGH (ref 70–99)
Potassium: 4 mmol/L (ref 3.5–5.1)
Sodium: 139 mmol/L (ref 135–145)

## 2024-01-26 LAB — CBC
HCT: 21.3 % — ABNORMAL LOW (ref 36.0–46.0)
Hemoglobin: 6.1 g/dL — ABNORMAL LOW (ref 12.0–15.0)
MCH: 25.6 pg — ABNORMAL LOW (ref 26.0–34.0)
MCHC: 28.6 g/dL — ABNORMAL LOW (ref 30.0–36.0)
MCV: 89.5 fL (ref 80.0–100.0)
Platelets: 320 10*3/uL (ref 150–400)
RBC: 2.38 MIL/uL — ABNORMAL LOW (ref 3.87–5.11)
RDW: 17.5 % — ABNORMAL HIGH (ref 11.5–15.5)
WBC: 7.3 10*3/uL (ref 4.0–10.5)
nRBC: 0 % (ref 0.0–0.2)

## 2024-01-26 LAB — TROPONIN I (HIGH SENSITIVITY)
Troponin I (High Sensitivity): 12 ng/L (ref <18)
Troponin I (High Sensitivity): 11 ng/L (ref <18)

## 2024-01-26 LAB — HEMOGLOBIN AND HEMATOCRIT, BLOOD
HCT: 22.2 % — ABNORMAL LOW (ref 36.0–46.0)
Hemoglobin: 6.9 g/dL — ABNORMAL LOW (ref 12.0–15.0)

## 2024-01-26 LAB — LACTIC ACID, PLASMA
Lactic Acid, Venous: 2.4 mmol/L (ref 0.5–1.9)
Lactic Acid, Venous: 2.3 mmol/L (ref 0.5–1.9)

## 2024-01-26 LAB — PREPARE RBC (CROSSMATCH)

## 2024-01-26 MED ORDER — ACETAMINOPHEN 325 MG PO TABS
650.0000 mg | ORAL_TABLET | Freq: Four times a day (QID) | ORAL | Status: DC | PRN
  Administered 2024-01-28 (×2): 650 mg via ORAL
  Filled 2024-01-26 (×2): qty 2

## 2024-01-26 MED ORDER — FUROSEMIDE 40 MG PO TABS
40.0000 mg | ORAL_TABLET | Freq: Every day | ORAL | Status: DC
  Administered 2024-01-27: 40 mg via ORAL
  Filled 2024-01-26: qty 1

## 2024-01-26 MED ORDER — ALPRAZOLAM 0.25 MG PO TABS
0.2500 mg | ORAL_TABLET | Freq: Two times a day (BID) | ORAL | Status: DC | PRN
  Administered 2024-01-27: 0.25 mg via ORAL
  Filled 2024-01-26: qty 1

## 2024-01-26 MED ORDER — SENNOSIDES-DOCUSATE SODIUM 8.6-50 MG PO TABS: 1.0000 | ORAL_TABLET | Freq: Every evening | ORAL | Status: DC | PRN

## 2024-01-26 MED ORDER — AMIODARONE HCL 200 MG PO TABS
100.0000 mg | ORAL_TABLET | Freq: Every day | ORAL | Status: DC
  Administered 2024-01-27: 100 mg via ORAL
  Filled 2024-01-26: qty 1

## 2024-01-26 MED ORDER — ACETAMINOPHEN 650 MG RE SUPP: 650.0000 mg | Freq: Four times a day (QID) | RECTAL | Status: DC | PRN

## 2024-01-26 MED ORDER — ONDANSETRON HCL 4 MG/2ML IJ SOLN: 4.0000 mg | Freq: Four times a day (QID) | INTRAMUSCULAR | Status: DC | PRN

## 2024-01-26 MED ORDER — SODIUM CHLORIDE 0.9 % IV BOLUS
1000.0000 mL | Freq: Once | INTRAVENOUS | Status: AC
  Administered 2024-01-26: 1000 mL via INTRAVENOUS

## 2024-01-26 MED ORDER — MIRTAZAPINE 15 MG PO TABS
7.5000 mg | ORAL_TABLET | Freq: Every day | ORAL | Status: DC
Start: 2024-01-26 — End: 2024-01-28
  Administered 2024-01-26 – 2024-01-27 (×2): 7.5 mg via ORAL
  Filled 2024-01-26 (×2): qty 1

## 2024-01-26 MED ORDER — PANTOPRAZOLE SODIUM 40 MG PO TBEC
40.0000 mg | DELAYED_RELEASE_TABLET | Freq: Every morning | ORAL | Status: DC
  Administered 2024-01-27: 40 mg via ORAL
  Filled 2024-01-26: qty 1

## 2024-01-26 MED ORDER — SALINE SPRAY 0.65 % NA SOLN: 2.0000 | Freq: Every day | NASAL | Status: DC | PRN

## 2024-01-26 MED ORDER — IOHEXOL 350 MG/ML SOLN
50.0000 mL | Freq: Once | INTRAVENOUS | Status: AC | PRN
  Administered 2024-01-26: 50 mL via INTRAVENOUS

## 2024-01-26 MED ORDER — ONDANSETRON HCL 4 MG PO TABS
4.0000 mg | ORAL_TABLET | Freq: Four times a day (QID) | ORAL | Status: DC | PRN
  Administered 2024-01-28: 4 mg via ORAL
  Filled 2024-01-26: qty 1

## 2024-01-26 MED ORDER — EMPTY CONTAINERS FLEXIBLE MISC
900.0000 mg | Freq: Once | Status: AC
  Administered 2024-01-26: 900 mg via INTRAVENOUS
  Filled 2024-01-26: qty 90

## 2024-01-26 MED ORDER — SODIUM CHLORIDE 0.9 % IV SOLN
10.0000 mL/h | Freq: Once | INTRAVENOUS | Status: DC
Start: 2024-01-26 — End: 2024-01-27

## 2024-01-26 MED ORDER — CARVEDILOL 6.25 MG PO TABS
12.5000 mg | ORAL_TABLET | Freq: Two times a day (BID) | ORAL | Status: DC
  Administered 2024-01-27: 12.5 mg via ORAL
  Filled 2024-01-26: qty 2

## 2024-01-26 MED ORDER — FLUTICASONE PROPIONATE 50 MCG/ACT NA SUSP: 2.0000 | Freq: Every evening | NASAL | Status: DC | PRN

## 2024-01-26 MED ORDER — POLYETHYLENE GLYCOL 3350 17 G PO PACK: 17.0000 g | PACK | Freq: Every day | ORAL | Status: DC | PRN

## 2024-01-26 NOTE — Assessment & Plan Note (Signed)
 Status post pacemaker placement.

## 2024-01-26 NOTE — ED Notes (Signed)
 CCMD called at this time

## 2024-01-26 NOTE — Hospital Course (Signed)
 Ms. Yvonne [name] is a 89 year old female with history of paroxysmal atrial fibrillation on Xarelto, combined chronic heart failure, hypertension, history of left bundle branch block, GERD, complete heart block status post pacemaker placement with [facility] Jude on 11/34/2018, history of GI bleed presumed secondary to NSAID use, history of peptic ulcer disease, physical deconditioning, who presents emergency department from [facility] facility for chief concerns of multiple falls over the past 3 days.  Vitals in the ED showed temperature 98, respiration rate of 20, heart rate 74, blood pressure initially 89/42, improved to 113/50, SpO2 100% on room air.  Serum sodium is 139, potassium 4.0, chloride 108, bicarb 21, BUN of 30, serum creatinine 1.41, EGFR of 35, nonfasting blood glucose 103, WBC 7.3, hemoglobin 6.1, platelets of 320.  High sensitive troponin is 12 and on repeat was 11.  Lactic acid was 2.4 and on repeat is 2.3.  UA was positive for trace leukocytes.  ED treatment: Sodium chloride 1 L bolus, Andexxa low lose ordered.  EDP discussed with neurosurgery on-call, Dr. Adriana Simas who advised that given patient's age and CODE STATUS, no indication for repeat CT scan of the head at this time.

## 2024-01-26 NOTE — ED Notes (Signed)
 Paper consent signed by patient at this time due to pin pad not working in room.

## 2024-01-26 NOTE — Assessment & Plan Note (Signed)
 Patient presenting with black stool We will hold home Xarelto on admission Status post type and screen with 1 unit of PRBC ordered for transfusion per EDP Nursing order to obtain hemoglobin/hematocrit after completion of blood transfusion ordered Check CBC in the a.m.

## 2024-01-26 NOTE — Plan of Care (Signed)

## 2024-01-26 NOTE — ED Notes (Addendum)
 Critical Result: Lactic Acid 2.4  Siadecki, MD aware

## 2024-01-26 NOTE — ED Notes (Signed)
 Pt to CT at this time.

## 2024-01-26 NOTE — Assessment & Plan Note (Addendum)
 Fall precaution Patient would benefit from PT, OT in the a.m. PT, OT not ordered on admission as patient is currently getting blood transfusion and is presenting with acute severe on chronic anemia with fatigue and subarachnoid hemorrhage

## 2024-01-26 NOTE — Assessment & Plan Note (Signed)
 Patient appears euvolemic and not in acute exacerbation at this time

## 2024-01-26 NOTE — Assessment & Plan Note (Signed)
 Carvedilol 12.5 mg p.o. twice daily resumed Xarelto not resumed on admission

## 2024-01-26 NOTE — Assessment & Plan Note (Addendum)
-  Registered dietitian has been consulted 

## 2024-01-26 NOTE — ED Provider Notes (Signed)
 [facility] Provider Note    Event Date/Time   First MD Initiated Contact with Patient 01/26/24 1157     (approximate)   History   Fall   HPI  Yvonne [name] is a 89 y.o. female with history of paroxysmal atrial fibrillation on Eliquis, CHF, complete heart block status post pacemaker, GI bleed, GERD, and arthritis who presents with multiple falls over the last several weeks, occurring sometimes multiple times per day.  The patient states that she is not sure what causes her to fall.  She does not lose consciousness, but states that they are also not mechanical falls in which she trips.  She just suddenly will fall on the floor.  She also reports increased shortness of breath over the last several days and some generalized weakness.  Her family member states that she appears more pale than normal.   I reviewed the past medical records.  The patient was seen in the ED on 12/12 last year for vaginal bleeding.  Previously she was admitted to the hospitalist service in late November with a right femoral neck fracture after a fall.  Physical Exam   Triage Vital Signs: ED Triage Vitals  Encounter Vitals Group     BP 01/26/24 1150 (!) 89/42     Systolic BP Percentile --      Diastolic BP Percentile --      Pulse Rate 01/26/24 1150 73     Resp 01/26/24 1150 20     Temp 01/26/24 1150 98 F (36.7 C)     Temp Source 01/26/24 1150 Oral     SpO2 01/26/24 1150 97 %     Weight --      Height --      Head Circumference --      Peak Flow --      Pain Score 01/26/24 1151 0     Pain Loc --      Pain Education --      Exclude from Growth Chart --     Most recent vital signs: Vitals:   01/26/24 1344 01/26/24 1531  BP: (!) 125/50 (!) 136/56  Pulse: 72 72  Resp: (!) 24 (!) 23  Temp:  97.8 F (36.6 C)  SpO2: 100% 100%     General: Alert, comfortable appearing, no distress.  CV:  Good peripheral perfusion.  Resp:  Normal effort.  Lungs CTAB. Abd:  No  distention.  Other:  EOMI.  PERRLA.  No facial droop.  Normal speech.  Motor intact in all extremities.  Normal coordination.  No midline spinal tenderness.  Conjunctival pallor.   ED Results / Procedures / Treatments   Labs (all labs ordered are listed, but only abnormal results are displayed) Labs Reviewed  BASIC METABOLIC PANEL WITH GFR - Abnormal; Notable for the following components:      Result Value   CO2 21 (*)    Glucose, Bld 103 (*)    BUN 30 (*)    Creatinine, Ser 1.41 (*)    Calcium 8.7 (*)    GFR, Estimated 35 (*)    All other components within normal limits  CBC - Abnormal; Notable for the following components:   RBC 2.38 (*)    Hemoglobin 6.1 (*)    HCT 21.3 (*)    MCH 25.6 (*)    MCHC 28.6 (*)    RDW 17.5 (*)    All other components within normal limits  URINALYSIS, ROUTINE W REFLEX MICROSCOPIC -  Abnormal; Notable for the following components:   Color, Urine STRAW (*)    APPearance CLEAR (*)    Leukocytes,Ua TRACE (*)    Bacteria, UA RARE (*)    All other components within normal limits  LACTIC ACID, PLASMA - Abnormal; Notable for the following components:   Lactic Acid, Venous 2.4 (*)    All other components within normal limits  LACTIC ACID, PLASMA - Abnormal; Notable for the following components:   Lactic Acid, Venous 2.3 (*)    All other components within normal limits  CBG MONITORING, ED  TYPE AND SCREEN  PREPARE RBC (CROSSMATCH)  TYPE AND SCREEN  TROPONIN I (HIGH SENSITIVITY)  TROPONIN I (HIGH SENSITIVITY)     EKG  ED ECG REPORT I, Dionne Bucy, the attending physician, personally viewed and interpreted this ECG.  Date: 01/26/2024 EKG Time: 1153 Rate: 72 Rhythm: Atrial sensed ventricular paced rhythm QRS Axis: normal Intervals: normal ST/T Wave abnormalities: normal Narrative Interpretation: no evidence of acute ischemia    RADIOLOGY  CT head: I independently viewed and interpreted the images; there is no midline shift or  mass effect.  Radiology report indicates the following:  1. Positive for Trace Acute Subarachnoid Hemorrhage in the middle  cranial fossa near the right ICA terminus. Legrand Rams this is  posttraumatic in nature in this setting. Follow-up CTA head could be  utilized to exclude the presence of intracranial aneurysm.  2. No skull fracture or other acute traumatic injury identified. No  other No acute intracranial abnormality.    CT cervical spine:  IMPRESSION:  1. No acute traumatic injury identified in the cervical spine.  2. Progressed and severe C3-C4 level spinal degeneration since 2019,  superimposed on chronic C2-C3 facet ankylosis.    PROCEDURES:  Critical Care performed: Yes, see critical care procedure note(s)  .Critical Care  Performed by: Dionne Bucy, MD Authorized by: Dionne Bucy, MD   Critical care provider statement:    Critical care time (minutes):  30   Critical care time was exclusive of:  Separately billable procedures and treating other patients   Critical care was necessary to treat or prevent imminent or life-threatening deterioration of the following conditions:  Circulatory failure   Critical care was time spent personally by me on the following activities:  Development of treatment plan with patient or surrogate, discussions with consultants, evaluation of patient's response to treatment, examination of patient, ordering and review of laboratory studies, ordering and review of radiographic studies, ordering and performing treatments and interventions, pulse oximetry, re-evaluation of patient's condition, review of old charts and obtaining history from patient or surrogate   Care discussed with: admitting provider      MEDICATIONS ORDERED IN ED: Medications  0.9 %  sodium chloride infusion (0 mL/hr Intravenous Hold 01/26/24 1228)  acetaminophen (TYLENOL) tablet 650 mg (has no administration in time range)    Or  acetaminophen (TYLENOL) suppository  650 mg (has no administration in time range)  ondansetron (ZOFRAN) tablet 4 mg (has no administration in time range)    Or  ondansetron (ZOFRAN) injection 4 mg (has no administration in time range)  senna-docusate (Senokot-S) tablet 1 tablet (has no administration in time range)  sodium chloride 0.9 % bolus 1,000 mL (0 mLs Intravenous Stopped 01/26/24 1425)  coag fact Xa recombinant (ANDEXXA) low dose infusion 900 mg (900 mg Intravenous New Bag/Given 01/26/24 1427)  iohexol (OMNIPAQUE) 350 MG/ML injection 50 mL (50 mLs Intravenous Contrast Given 01/26/24 1453)     IMPRESSION /  MDM / ASSESSMENT AND PLAN / ED COURSE  I reviewed the triage vital signs and the nursing notes.  89 year old female with PMH as noted above presents after multiple falls also with generalized weakness and some shortness of breath.  On exam, her vital signs are normal although she was hypotensive on arrival.  Neurologic exam is nonfocal.  She has no midline spinal tenderness.  Differential diagnosis includes, but is not limited to, dehydration, electrolyte abnormality, other metabolic cause, anemia, UTI or other infection, less likely cardiac etiology.  We will obtain lab workup for further evaluation and give a fluid bolus.  The patient also will need CT head and cervical spine to rule out acute injury.  Patient's presentation is most consistent with acute presentation with potential threat to life or bodily function.  The patient is on the cardiac monitor to evaluate for evidence of arrhythmia and/or significant heart rate changes.  ----------------------------------------- 3:43 PM on 01/26/2024 -----------------------------------------  CBC is significant for a hemoglobin of 6.1.  I have ordered a transfusion of PRBCs.  Other lab workup is unremarkable.  BMP shows no concerning electrolyte abnormalities.  CT head shows a trace subarachnoid hemorrhage.  I consulted and discussed case with Dr. Adriana Simas from neurosurgery.   He recommends getting a CTA, which is pending, and advises that we could consider a 6-hour repeat CT head although if the patient is DNR/DNI and would not necessarily want intervention, this may not be needed.  I confirmed that the patient is in fact DNR/DNI with the patient and her son.  I counseled them on the results of the workup and the plan of care.  I confirmed that the patient last took her Xarelto yesterday evening.  I have ordered ANDEXXA to reverse the Xarelto.  The patient will need admission for further management.  I consulted Dr. Sedalia Muta from the hospitalist service; based on our discussion she agrees to evaluate the patient for admission.   FINAL CLINICAL IMPRESSION(S) / ED DIAGNOSES   Final diagnoses:  Subarachnoid hematoma, with unknown loss of consciousness status, initial encounter (HCC)  Falls  Anemia, unspecified type     Rx / DC Orders   ED Discharge Orders     None        Note:  This document was prepared using Dragon voice recognition software and may include unintentional dictation errors.    Dionne Bucy, MD 01/26/24 1547

## 2024-01-26 NOTE — ED Triage Notes (Signed)
 Pt to ED via [facility] from [facility] in [place]. Pt staff reports pt's 3rd day of falling. Pt reports fell and hit the back of her head today. Pt unsure of why she fell. Pt denies LOC. Pt is on blood thinner.. BP 88/53 with repeat 89/42.

## 2024-01-26 NOTE — ED Notes (Signed)
 Pt called out requesting to use the restroom. Pt able to transfer to wheelchair with minimal assist. Urine sample collected at this time.

## 2024-01-26 NOTE — H&P (Signed)
 History and Physical   Yvonne [name] ACZ:[id number] DOB: 1929-06-11 DOA: 01/26/2024  PCP: Almetta Lovely, [facility]  Outpatient Specialists: Dr. Graciela Husbands, [facility] cardiology Patient coming from: [facility]  I have personally briefly reviewed patient's old medical records in [facility] EMR.  Chief Concern: Multiple falls in 3 days  HPI: Ms. Yvonne [name] is a 89 year old female with history of paroxysmal atrial fibrillation on Xarelto, combined chronic heart failure, hypertension, history of left bundle branch block, GERD, complete heart block status post pacemaker placement with [facility] Yvonne [name] on 11/34/2018, history of GI bleed presumed secondary to NSAID use, history of peptic ulcer disease, physical deconditioning, who presents emergency department from [facility] facility for chief concerns of multiple falls over the past 3 days.  Vitals in the ED showed temperature 98, respiration rate of 20, heart rate 74, blood pressure initially 89/42, improved to 113/50, SpO2 100% on room air.  Serum sodium is 139, potassium 4.0, chloride 108, bicarb 21, BUN of 30, serum creatinine 1.41, EGFR of 35, nonfasting blood glucose 103, WBC 7.3, hemoglobin 6.1, platelets of 320.  High sensitive troponin is 12 and on repeat was 11.  Lactic acid was 2.4 and on repeat is 2.3.  UA was positive for trace leukocytes.  ED treatment: Sodium chloride 1 L bolus, Andexxa low lose ordered.  EDP discussed with neurosurgery on-call, Dr. Adriana Simas who advised that given patient's age and CODE STATUS, no indication for repeat CT scan of the head at this time. --------------------------------- At bedside, patient able to tell me her first and last name, age, location, current calendar year.  She reports that today she noticed that her stool was very black.  She reports that her stool has been black for about 1 year.  She denies iron supplementation.  She endorses compliance with Xarelto, last dose was evening of  01/25/2024.  She reports she has been falling a lot due to feeling fatigue and tired easily.  She denies chest pain, shortness of breath, dysuria, hematuria, diarrhea, swelling of her lower extremities, loss of consciousness with these falling events.  She denies nausea, vomiting, fever, chills, cough.  Social history: Patient is currently from [facility] facility.  Patient is widowed, her husband passed away about 5 years ago.  She denies history of tobacco, EtOH, recreational drug use.  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  ROS: Constitutional: no weight change, no fever ENT/Mouth: no sore throat, no rhinorrhea Eyes: no eye pain, no vision changes Cardiovascular: no chest pain, no dyspnea,  no edema, no palpitations Respiratory: no cough, no sputum, no wheezing Gastrointestinal: no nausea, no vomiting, no diarrhea, no constipation, + black stool Genitourinary: no urinary incontinence, no dysuria, no hematuria Musculoskeletal: no arthralgias, no myalgias Skin: no skin lesions, no pruritus, Neuro: + weakness, no loss of consciousness, no syncope, + falling Psych: no anxiety, no depression, +=no decrease appetite Heme/Lymph: no bruising, no bleeding  ED Course: Discussed with EDP, patient requiring hospitalization for chief concerns of acute severe on chronic anemia and trace subarachnoid hemorrhage in a patient with anticoagulation use.  Assessment/Plan  Principal Problem:   Acute on chronic anemia Active Problems:   Fall at home, initial encounter   Chronic anticoagulation   Complete heart block (HCC)   Heart failure with reduced ejection fraction (HCC)   Essential hypertension   Anxiety   Normocytic anemia   GERD   PEPTIC ULCER DISEASE, HX OF  Protein calorie malnutrition (HCC)   Physical deconditioning   LBBB (left bundle branch block)   PAF (paroxysmal atrial fibrillation) (HCC)   Assessment and Plan:  * Acute on  chronic anemia Patient presenting with black stool We will hold home Xarelto on admission Status post type and screen with 1 unit of PRBC ordered for transfusion per EDP Nursing order to obtain hemoglobin/hematocrit after completion of blood transfusion ordered Check CBC in the a.m.  Chronic anticoagulation Home Xarelto will not be continued on admission  Anxiety Home alprazolam 0.25 mg p.o. twice daily as needed for anxiety resumed Mirtazapine 7.5 mg nightly resumed  Essential hypertension Carvedilol 12.5 mg p.o. twice daily resumed Furosemide 40 mg daily resumed 01/27/2024  Heart failure with reduced ejection fraction (HCC) Patient appears euvolemic and not in acute exacerbation at this time  Complete heart block (HCC) Status post pacemaker placement  PAF (paroxysmal atrial fibrillation) (HCC) Carvedilol 12.5 mg p.o. twice daily resumed Xarelto not resumed on admission  Physical deconditioning Fall precaution Patient would benefit from PT, OT in the a.m. PT, OT not ordered on admission as patient is currently getting blood transfusion and is presenting with acute severe on chronic anemia with fatigue and subarachnoid hemorrhage  Protein calorie malnutrition [facility]) Registered dietitian has been consulted  Chart reviewed.   DVT prophylaxis: ted hose; home Xarelto not resumed on admission in setting subarachnoid hemorrhage Code Status: DNR/DNI, ACP reviewed Diet: heart healthy diet Family Communication: no, patient states son already knows Disposition Plan: pending clinical course  Consults called: neurosurgery per EDP Admission status: telemetery cardiac  Past Medical History:  Diagnosis Date   Anxiety state, unspecified    Arthritis    Cancer (HCC)    basil cell carcinomas removed   Cataract    Complete heart block (HCC)    a. s/p [name] P[facility] 09/26/17.   Esophageal reflux    Hemorrhage of gastrointestinal tract, unspecified    a. occurred on Aleve   History of  peptic ulcer    LBBB (left bundle branch block)    Left ventricular systolic dysfunction    hx of mild   Mitral valve insufficiency and aortic valve insufficiency    a. Remote echo 2010: mild focal basal hypertrophy of septum, EF 50-55%, mid-distal inferoseptal myocardium, grade 1 DD, LBBB, mild AI/MR, PASP mildly increased.   PAF (paroxysmal atrial fibrillation) (HCC)    Paroxysmal supraventricular tachycardia (HCC)    Wears glasses    Past Surgical History:  Procedure Laterality Date   ABDOMINAL HYSTERECTOMY     ANTERIOR APPROACH HEMI HIP ARTHROPLASTY Right 09/27/2023   Procedure: ANTERIOR APPROACH HEMI HIP ARTHROPLASTY;  Surgeon: Samson Frederic, MD;  Location: [facility] OR;  Service: Orthopedics;  Laterality: Right;   APPENDECTOMY     BREAST SURGERY     biopsy   CATARACT EXTRACTION Left    ESOPHAGOGASTRODUODENOSCOPY     ESOPHAGOGASTRODUODENOSCOPY (EGD) WITH PROPOFOL N/A 10/10/2015   Procedure: ESOPHAGOGASTRODUODENOSCOPY (EGD) WITH PROPOFOL;  Surgeon: Charolett Bumpers, MD;  Location: [facility] ENDOSCOPY;  Service: Endoscopy;  Laterality: N/A;   FOOT SURGERY Left    hammer toe and bunion   KNEE ARTHROSCOPY  2009   left   OPEN REDUCTION INTERNAL FIXATION (ORIF) METACARPAL Left 11/05/2013   Procedure: CLOSED REDUCTION/PINNING VS OPEN REDUCTION INTERNAL FIXATION (ORIF) LEFT SMALL  METACARPAL FRACTURE ;  Surgeon: Wyn Forster., MD;  Location: [facility];  Service: Orthopedics;  Laterality: Left;   ORIF HUMERUS FRACTURE Left 04/10/2015  Procedure: OPEN REDUCTION INTERNAL FIXATION (ORIF) PROXIMAL HUMERUS FRACTURE;  Surgeon: Eldred Manges, MD;  Location: [facility] OR;  Service: Orthopedics;  Laterality: Left;   PACEMAKER IMPLANT N/A 09/26/2017   Procedure: PACEMAKER IMPLANT;  Surgeon: Regan Lemming, MD;  Location: [facility] INVASIVE CV LAB;  Service: Cardiovascular;  Laterality: N/A;   TONSILLECTOMY     TUBAL LIGATION     Social History:  reports that she has never smoked. She has never  used smokeless tobacco. She reports that she does not drink alcohol and does not use drugs.  Allergies  Allergen Reactions   Aleve [Naproxen Sodium] Other (See Comments)    Gi bleed   Ambien [Zolpidem] Other (See Comments)    Pt states "made me have crazy thoughts"   Flexeril [Cyclobenzaprine] Other (See Comments)    Urinary rentention   Adhesive [Tape] Rash and Other (See Comments)    Skin redness   Aspirin Other (See Comments)    Told to avoid high doses due to history of stomach ulcer   Family History  Problem Relation Age of Onset   Hypertension Mother    Heart failure Father    Stroke Sister    Heart attack Neg Hx    Family history: Family history reviewed and not pertinent.  Prior to Admission medications   Medication Sig Start Date End Date Taking? Authorizing Provider  acetaminophen (TYLENOL) 500 MG tablet Take 2 tablets (1,000 mg total) by mouth 3 (three) times daily. 09/30/23   Joseph Art, DO  ALPRAZolam (XANAX) 0.25 MG tablet Take 1 tablet (0.25 mg total) by mouth 2 (two) times daily as needed. 09/30/23   Joseph Art, DO  amiodarone (PACERONE) 200 MG tablet TAKE ONE-HALF TABLET DAILY 09/15/23   Duke Salvia, MD  buPROPion (WELLBUTRIN XL) 300 MG 24 hr tablet Take 1 tablet by mouth daily. 04/12/19   [provider]  carvedilol (COREG) 12.5 MG tablet Take 1 tablet (12.5 mg total) by mouth 2 (two) times daily. 09/08/23   Duke Salvia, MD  fluticasone (FLONASE) 50 MCG/ACT nasal spray Place 2 sprays into both nostrils daily. 2 sprays each nostril at night 07/20/21   Drema Halon, MD  furosemide (LASIX) 20 MG tablet TAKE TWO TABLETS BY MOUTH DAILY 09/22/23   Duke Salvia, MD  HYDROcodone-acetaminophen (NORCO/VICODIN) 5-325 MG tablet Take 1 tablet by mouth every 6 (six) hours as needed for moderate pain (pain score 4-6). 09/28/23   McClung, Fuller Plan D, PA  mirtazapine (REMERON) 7.5 MG tablet Take 7.5 mg by mouth at bedtime. 08/25/23   [provider]  ondansetron (ZOFRAN) 8 MG tablet Take 8 mg by mouth every 8 (eight) hours as needed for nausea or vomiting.    [provider]  pantoprazole (PROTONIX) 40 MG tablet Take 40 mg by mouth every morning. 08/25/23   [provider]  polyethylene glycol (MIRALAX / GLYCOLAX) 17 g packet Take 17 g by mouth daily as needed for mild constipation. 09/30/23   Joseph Art, DO  potassium chloride (K-DUR) 10 MEQ tablet Take 10 mEq by mouth 2 (two) times daily. 11/04/17   [provider]  Propylene Glycol (SYSTANE COMPLETE OP) Apply 1 drop to eye as needed.    [provider]  sodium chloride (OCEAN) 0.65 % SOLN nasal spray Place 2 sprays into both nostrils daily as needed for congestion.    [provider]  XARELTO 15 MG TABS tablet TAKE ONE TABLET EVERY DAY with SUPPER  09/22/23   Duke Salvia, MD   Physical Exam: Vitals:   01/26/24 1550 01/26/24 1700 01/26/24 1800 01/26/24 1800  BP: (!) 128/59 129/85  (!) 140/59  Pulse: 72 71  76  Resp: 20 (!) 21  18  Temp: 97.6 F (36.4 C)   97.8 F (36.6 C)  TempSrc: Oral   Oral  SpO2: 100% 100%  95%  Weight:   62.5 kg   Height:   5\' 6"  (1.676 m)    Constitutional: appears age-appropriate, frail Eyes: PERRL, lids and conjunctivae normal ENMT: Mucous membranes are moist. Posterior pharynx clear of any exudate or lesions. Age-appropriate dentition. Hearing appropriate Neck: normal, supple, no masses, no thyromegaly Respiratory: clear to auscultation bilaterally, no wheezing, no crackles. Normal respiratory effort. No accessory muscle use.  Cardiovascular: Regular rate and rhythm, no murmurs / rubs / gallops. No extremity edema. 2+ pedal pulses. No carotid bruits.  Abdomen: no tenderness, no masses palpated, no hepatosplenomegaly. Bowel sounds positive.  Musculoskeletal: no clubbing / cyanosis. No joint deformity upper and lower extremities. Good ROM, no contractures, no atrophy. Normal muscle tone.   Skin: no rashes, lesions, ulcers. No induration.  Pale skin Neurologic: Sensation intact. Strength 5/5 in all 4.  Psychiatric: Normal judgment and insight. Alert and oriented x 3. Normal mood.   EKG: independently reviewed, showing atrial sensed with rate of 72, QTc 514  Chest x-ray on Admission: Chest x-ray ordered, pending completion and radiology read at this time  CT HEAD WO CONTRAST Addendum Date: 01/26/2024 ADDENDUM REPORT: 01/26/2024 13:42 ADDENDUM: Study discussed by telephone with Dr. Dionne Bucy on 01/26/2024 at 1337 hours. And correction, the final sentence of Impression #2 should read: "No other acute intracranial abnormality." Electronically Signed   By: Odessa Fleming M.D.   On: 01/26/2024 13:42   Result Date: 01/26/2024 CLINICAL DATA:  89 year old female status post fall striking back of the head. On blood thinners. EXAM: CT HEAD WITHOUT CONTRAST TECHNIQUE: Contiguous axial images were obtained from the base of the skull through the vertex without intravenous contrast. RADIATION DOSE REDUCTION: This exam was performed according to the departmental dose-optimization program which includes automated exposure control, adjustment of the mA and/or kV according to patient size and/or use of iterative reconstruction technique. COMPARISON:  Head CT 09/01/2022. FINDINGS: Brain: Stable cerebral volume since 2023, normal for age. No midline shift, mass effect, or evidence of intracranial mass lesion. Small volume subarachnoid hemorrhage in the right middle cranial fossa near the right ICA terminus series 3, image 10). No IVH or other intracranial hemorrhage is identified. No ventriculomegaly. Other basilar cisterns appear normal. Stable gray-white matter differentiation throughout the brain. No cortically based acute infarct identified. No convincing cortical contusion. Vascular: Calcified atherosclerosis at the skull base. No suspicious intracranial vascular hyperdensity. Skull: No fracture  identified. Sinuses/Orbits: Chronic right posterior ethmoid and sphenoid sinusitis with mucoperiosteal thickening. Stable sinus, mastoid aeration. Other: No acute orbit or scalp soft tissue injury identified. Chronic prominence of the superior ophthalmic veins does not appear significantly changed from a 2018 comparison. Traumatic Brain Injury Risk Stratification Skull Fracture: No - Low/mBIG 1 Subdural Hematoma (SDH): No - Low Subarachnoid Hemorrhage [facility]): trace (up to 2 sulci) - Low/mBIG 1 Epidural Hematoma (EDH): No - Low/mBIG 1 Cerebral contusion, intra-axial, intraparenchymal Hemorrhage (IPH): No Intraventricular Hemorrhage (IVH): No - Low/mBIG 1 Midline Shift > 1mm or Edema/effacement of sulci/vents: No - Low/mBIG 1 ---------------------------------------------------- IMPRESSION: 1. Positive for Trace Acute Subarachnoid Hemorrhage in the middle cranial fossa near the right ICA  terminus. Legrand Rams this is posttraumatic in nature in this setting. Follow-up CTA head could be utilized to exclude the presence of intracranial aneurysm. 2. No skull fracture or other acute traumatic injury identified. No other No acute intracranial abnormality. Electronically Signed: By: Odessa Fleming M.D. On: 01/26/2024 13:20   CT Cervical Spine Wo Contrast Result Date: 01/26/2024 CLINICAL DATA:  89 year old female status post fall striking back of the head. On blood thinners. EXAM: CT CERVICAL SPINE WITHOUT CONTRAST TECHNIQUE: Multidetector CT imaging of the cervical spine was performed without intravenous contrast. Multiplanar CT image reconstructions were also generated. RADIATION DOSE REDUCTION: This exam was performed according to the departmental dose-optimization program which includes automated exposure control, adjustment of the mA and/or kV according to patient size and/or use of iterative reconstruction technique. COMPARISON:  Head CT today.  Cervical spine CT 12/30/2017. FINDINGS: Alignment: Chronic but progressed degenerative  anterolisthesis of C3 on C4, now up to 3 mm. Cervicothoracic junction alignment is within normal limits. Bilateral posterior element alignment is within normal limits. Skull base and vertebrae: Bone mineralization is within normal limits for age. Visualized skull base is intact. No atlanto-occipital dissociation. C1 and C2 appear intact and aligned. No acute osseous abnormality identified. Soft tissues and spinal canal: No prevertebral fluid or swelling. No visible canal hematoma. Negative visible noncontrast neck soft tissues aside from cervical carotid calcified atherosclerosis. Disc levels: Chronic cervical spine degeneration superimposed on chronic degenerative ankylosis of the left C2-C3 facets. Severe and progressed left side C3-C4 facet arthropathy in the setting of progressed spondylolisthesis there. Probable mild C3-C4 multifactorial spinal stenosis. Upper chest: Visible upper thoracic levels appear intact. Mild apical lung scarring. IMPRESSION: 1. No acute traumatic injury identified in the cervical spine. 2. Progressed and severe C3-C4 level spinal degeneration since 2019, superimposed on chronic C2-C3 facet ankylosis. Electronically Signed   By: Odessa Fleming M.D.   On: 01/26/2024 13:24   Labs on Admission: I have personally reviewed following labs  CBC: Recent Labs  Lab 01/26/24 1157  WBC 7.3  HGB 6.1*  HCT 21.3*  MCV 89.5  PLT 320   Basic Metabolic Panel: Recent Labs  Lab 01/26/24 1157  NA 139  K 4.0  CL 108  CO2 21*  GLUCOSE 103*  BUN 30*  CREATININE 1.41*  CALCIUM 8.7*   GFR: Estimated Creatinine Clearance: 23.8 mL/min (A) (by C-G formula based on SCr of 1.41 mg/dL (H)).  Urine analysis:    Component Value Date/Time   COLORURINE STRAW (A) 01/26/2024 1233   APPEARANCEUR CLEAR (A) 01/26/2024 1233   LABSPEC 1.009 01/26/2024 1233   PHURINE 5.0 01/26/2024 1233   GLUCOSEU NEGATIVE 01/26/2024 1233   GLUCOSEU NEG mg/dL 16/07/9603 5409   HGBUR NEGATIVE 01/26/2024 1233    BILIRUBINUR NEGATIVE 01/26/2024 1233   KETONESUR NEGATIVE 01/26/2024 1233   PROTEINUR NEGATIVE 01/26/2024 1233   UROBILINOGEN 0.2 04/23/2015 2023   NITRITE NEGATIVE 01/26/2024 1233   LEUKOCYTESUR TRACE (A) 01/26/2024 1233   This document was prepared using Dragon Voice Recognition software and may include unintentional dictation errors.  Dr. Sedalia Muta [facility]  If 7PM-7AM, please contact overnight-coverage provider If 7AM-7PM, please contact day attending provider [email or web address]  01/26/2024, 6:08 PM

## 2024-01-26 NOTE — ED Notes (Signed)
 Lab called at this time due to Lactic Acid not showing in process.

## 2024-01-26 NOTE — Assessment & Plan Note (Signed)
 Carvedilol 12.5 mg p.o. twice daily resumed Furosemide 40 mg daily resumed 01/27/2024

## 2024-01-26 NOTE — Assessment & Plan Note (Signed)
 Home Xarelto will not be continued on admission

## 2024-01-26 NOTE — Assessment & Plan Note (Signed)
 Home alprazolam 0.25 mg p.o. twice daily as needed for anxiety resumed Mirtazapine 7.5 mg nightly resumed

## 2024-01-26 NOTE — ED Notes (Addendum)
 Pt called out requesting to use the restroom. Pt complains of nausea, but states that it has improved after having BM. Resting comfortable in bed at this time.

## 2024-01-27 DIAGNOSIS — D649 Anemia, unspecified: Secondary | ICD-10-CM | POA: Diagnosis not present

## 2024-01-27 LAB — CBC
HCT: 21.9 % — ABNORMAL LOW (ref 36.0–46.0)
Hemoglobin: 6.8 g/dL — ABNORMAL LOW (ref 12.0–15.0)
MCH: 26.1 pg (ref 26.0–34.0)
MCHC: 31.1 g/dL (ref 30.0–36.0)
MCV: 83.9 fL (ref 80.0–100.0)
Platelets: 261 10*3/uL (ref 150–400)
RBC: 2.61 MIL/uL — ABNORMAL LOW (ref 3.87–5.11)
RDW: 17 % — ABNORMAL HIGH (ref 11.5–15.5)
WBC: 5.9 10*3/uL (ref 4.0–10.5)
nRBC: 0 % (ref 0.0–0.2)

## 2024-01-27 LAB — PREPARE RBC (CROSSMATCH)

## 2024-01-27 LAB — BASIC METABOLIC PANEL WITH GFR
Anion gap: 8 (ref 5–15)
BUN: 21 mg/dL (ref 8–23)
CO2: 25 mmol/L (ref 22–32)
Calcium: 8.3 mg/dL — ABNORMAL LOW (ref 8.9–10.3)
Chloride: 106 mmol/L (ref 98–111)
Creatinine, Ser: 1.03 mg/dL — ABNORMAL HIGH (ref 0.44–1.00)
GFR, Estimated: 51 mL/min — ABNORMAL LOW (ref >60)
Glucose, Bld: 81 mg/dL (ref 70–99)
Potassium: 3 mmol/L — ABNORMAL LOW (ref 3.5–5.1)
Sodium: 139 mmol/L (ref 135–145)

## 2024-01-27 LAB — HEMOGLOBIN AND HEMATOCRIT, BLOOD
HCT: 30.6 % — ABNORMAL LOW (ref 36.0–46.0)
Hemoglobin: 9.2 g/dL — ABNORMAL LOW (ref 12.0–15.0)

## 2024-01-27 MED ORDER — MORPHINE SULFATE (PF) 2 MG/ML IV SOLN: 2.0000 mg | INTRAVENOUS | Status: DC | PRN

## 2024-01-27 MED ORDER — PANTOPRAZOLE SODIUM 40 MG IV SOLR: 40.0000 mg | Freq: Two times a day (BID) | INTRAVENOUS | Status: DC

## 2024-01-27 MED ORDER — ADULT MULTIVITAMIN W/MINERALS CH
1.0000 | ORAL_TABLET | Freq: Every day | ORAL | Status: DC
  Administered 2024-01-27: 1 via ORAL
  Filled 2024-01-27: qty 1

## 2024-01-27 MED ORDER — ENSURE ENLIVE PO LIQD
237.0000 mL | Freq: Two times a day (BID) | ORAL | Status: DC
  Administered 2024-01-27: 237 mL via ORAL

## 2024-01-27 MED ORDER — GLYCOPYRROLATE 0.2 MG/ML IJ SOLN: 0.2000 mg | INTRAMUSCULAR | Status: DC | PRN

## 2024-01-27 MED ORDER — BIOTENE DRY MOUTH MT LIQD: 15.0000 mL | OROMUCOSAL | Status: DC | PRN

## 2024-01-27 MED ORDER — POTASSIUM CHLORIDE CRYS ER 20 MEQ PO TBCR
40.0000 meq | EXTENDED_RELEASE_TABLET | Freq: Once | ORAL | Status: AC
  Administered 2024-01-27: 40 meq via ORAL
  Filled 2024-01-27: qty 2

## 2024-01-27 MED ORDER — LORAZEPAM 2 MG/ML IJ SOLN: 1.0000 mg | INTRAMUSCULAR | Status: DC | PRN

## 2024-01-27 MED ORDER — DIPHENHYDRAMINE HCL 50 MG/ML IJ SOLN: 12.5000 mg | INTRAMUSCULAR | Status: DC | PRN

## 2024-01-27 MED ORDER — HALOPERIDOL 0.5 MG PO TABS: 0.5000 mg | ORAL_TABLET | ORAL | Status: DC | PRN

## 2024-01-27 MED ORDER — GLYCOPYRROLATE 1 MG PO TABS: 1.0000 mg | ORAL_TABLET | ORAL | Status: DC | PRN

## 2024-01-27 MED ORDER — HALOPERIDOL LACTATE 2 MG/ML PO CONC: 0.5000 mg | ORAL | Status: DC | PRN

## 2024-01-27 MED ORDER — LORAZEPAM 2 MG/ML PO CONC: 1.0000 mg | ORAL | Status: DC | PRN

## 2024-01-27 MED ORDER — HALOPERIDOL LACTATE 5 MG/ML IJ SOLN: 0.5000 mg | INTRAMUSCULAR | Status: DC | PRN

## 2024-01-27 MED ORDER — OXYCODONE HCL 5 MG PO TABS: 5.0000 mg | ORAL_TABLET | ORAL | Status: DC | PRN

## 2024-01-27 MED ORDER — POLYVINYL ALCOHOL 1.4 % OP SOLN: 1.0000 [drp] | Freq: Four times a day (QID) | OPHTHALMIC | Status: DC | PRN

## 2024-01-27 MED ORDER — SODIUM CHLORIDE 0.9% IV SOLUTION: Freq: Once | INTRAVENOUS | Status: AC

## 2024-01-27 NOTE — Progress Notes (Signed)
 Initial Nutrition Assessment  DOCUMENTATION CODES:   Not applicable  INTERVENTION:   -Liberalize diet to regular for widest variety of meal selections -MVI with minerals daily -Ensure Enlive po BID, each supplement provides 350 kcal and 20 grams of protein.  -Magic cup TID with meals, each supplement provides 290 kcal and 9 grams of protein   NUTRITION DIAGNOSIS:   Inadequate oral intake related to poor appetite as evidenced by per patient/family report.  GOAL:   Patient will meet greater than or equal to 90% of their needs  MONITOR:   PO intake, Supplement acceptance  REASON FOR ASSESSMENT:   Consult Assessment of nutrition requirement/status  ASSESSMENT:   Pt with history of paroxysmal atrial fibrillation on Xarelto, combined chronic heart failure, hypertension, history of left bundle branch block, GERD, complete heart block status post pacemaker placement with [facility] on 11/34/2018, history of GI bleed presumed secondary to NSAID use, history of peptic ulcer disease, physical deconditioning, who presents from [facility] for chief concerns of multiple falls over the past 3 days PTA.  Pt admitted with acute on chronic anemia.   Reviewed I/O's: +357 ml x 24 hours  UOP: 250 ml x 24 hours  Pt receiving blood transfusion at time of visit. Case discussed with RN.   Spoke with pt at bedside, who was pleasant and in good spirits today. She reports feeling better since admission. Per pt, she ordered "a big breakfast" this morning, but only able to eat a few bites. Pt shares that she is "not a big eater" at baseline, but consumes 3 meals per day. Pt resides at [facility] and has access to 3 meals per day in the dining room. Per pt, she does eat 3 meals, but does not eat much secondary to early satiety. She denies any difficulty chewing or swallowing. She drinks Premier Protein shakes (which are purchased by her daughter), but is not consistent with them   Pt denies any  weight loss. She reports she is weighed twice a month at facility and is UBW is around 135#. Reviewed wt hx; pt has experienced a 4.1% wt loss over the past 4 months, which is not significant for time frame.   Discussed importance of good meal and supplement intake to promote healing. Pt amenable to supplements and is requesting ice cream. RD will trial Magic Cups.  Medications reviewed and include lasix and protonix.   Labs reviewed: K: 3, Mg: 2.6 (inpatient orders for glycemic control are ).    NUTRITION - FOCUSED PHYSICAL EXAM:  Flowsheet Row Most Recent Value  Orbital Region No depletion  Upper Arm Region Mild depletion  Thoracic and Lumbar Region No depletion  Buccal Region No depletion  Temple Region Mild depletion  Clavicle Bone Region No depletion  Clavicle and Acromion Bone Region No depletion  Scapular Bone Region No depletion  Dorsal Hand No depletion  Patellar Region No depletion  Anterior Thigh Region No depletion  Posterior Calf Region No depletion  Edema (RD Assessment) None  Hair Reviewed  Eyes Reviewed  Mouth Reviewed  Skin Reviewed  Nails Reviewed       Diet Order:   Diet Order             Diet regular Room service appropriate? Yes; Fluid consistency: Thin  Diet effective now                   EDUCATION NEEDS:   Education needs have been addressed  Skin:  Skin Assessment:  Reviewed RN Assessment  Last BM:  01/26/24  Height:   Ht Readings from Last 1 Encounters:  01/26/24 5\' 6"  (1.676 m)    Weight:   Wt Readings from Last 1 Encounters:  01/26/24 62.5 kg    Ideal Body Weight:  59.1 kg  BMI:  Body mass index is 22.24 kg/m.  Estimated Nutritional Needs:   Kcal:  1650-1850  Protein:  85-100 grams  Fluid:  1.6-1.8 L    Levada Schilling, RD, LDN, CDCES Registered Dietitian III Certified Diabetes Care and Education Specialist If unable to reach this RD, please use "RD Inpatient" group chat on secure chat between hours of 8am-4 pm  daily

## 2024-01-27 NOTE — Plan of Care (Signed)

## 2024-01-27 NOTE — Progress Notes (Signed)
 Progress Note   Patient: Yvonne [name] [id number] DOB: May 01, 1929 DOA: 01/26/2024     1 DOS: the patient was seen and examined on 01/27/2024   Brief hospital course: 89yo with h/o afib on Xarelto, chronic combined congestive heart failure, hypertension, pacemaker placement, and history of GI bleed presumed secondary to NSAID use who presented from  [facility] facility on 3/31 for multiple falls over the past 3 days.  She was noted to have black stool; Xarelto held, Hgb 6.1 and transfused 1 unit PRBC.  Assessment and Plan:  Upper GI Bleed Patient is presenting with melena and anemia, suggestive of upper GI bleeding. Patient has history of remote UGI bleed that was thought to be related to NSAIDs Most likely diagnosis is gastric or duodenal ulcer, esophagitis or gastritis, or Mallory-Weiss tear. Admitted to telemetry bed -> med surg Patient does not desire GI evaluation, scope Start IV pantoprazole 40 BIDZofran IV for nausea Avoid NSAIDs and SQ heparin Maintain IV access (2 large bore IVs if possible). GOC conversation (see below)  ABLA Patient's SOB and fatigue are most likely caused by anemia secondary to upper GI bleeding Her Hgb decreased from 10 on 10/09/2023 to 6.1 on presentation She was transfused 1 unit with increase to 6.8 Transfusing another unit now Monitor closely and follow cbc q12h, transfuse as necessary for Hbg <7    Chronic anticoagulation Home Xarelto will not be continued on admission   Anxiety Home alprazolam 0.25 mg p.o. twice daily as needed for anxiety resumed Mirtazapine 7.5 mg nightly resumed   Essential hypertension Carvedilol 12.5 mg p.o. twice daily resumed Furosemide 40 mg daily resumed 01/27/2024   Heart failure with reduced ejection fraction  Patient appears euvolemic and not in acute exacerbation at this time Last echo was in 2017 with EF 40-45% and grade 1 DD   Complete heart block Status post pacemaker placement   PAF (paroxysmal  atrial fibrillation)  Carvedilol 12.5 mg p.o. twice daily resumed Xarelto not resumed on admission   Physical deconditioning Fall precaution Patient would benefit from PT, OT in the a.m. PT, OT not ordered on admission as patient is currently getting blood transfusion and is presenting with acute severe on chronic anemia with fatigue and subarachnoid hemorrhage   Nutrition Status: Nutrition Problem: Inadequate oral intake Etiology: poor appetite Signs/Symptoms: per patient/family report Interventions: Ensure Enlive (each supplement provides 350kcal and 20 grams of protein), MVI, Magic cup, Liberalize Diet   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 ICA aneurysm  SAH Recurrent falls PTA Trace acute SAH  GOC Patient was pretty clear about not desiring further GI evaluation but was concerned that her son would want aggressive evaluation I spoke with him - he and his sister have been moving towards comfort care Palliative care consulted Her son said that he would try to be here this evening to have discussion with his mother but appears to be moving towards home hospice without further hospitalizations    Consultants: Palliative care Hold PT/OT for now until after GOC discussion  Procedures: None  Antibiotics: None  30 Day Unplanned Readmission Risk Score    Flowsheet Row ED to Hosp-Admission (Current) from 01/26/2024 in [facility] 1C MEDICAL TELEMETRY  30 Day Unplanned Readmission Risk Score (%) 24.56 Filed at 01/27/2024 0401       This score is the patient's risk of an unplanned readmission within 30 days of being discharged (0 -100%). The score is based on dignosis, age, lab  data, medications, orders, and past utilization.   Low:  0-14.9   Medium: 15-21.9   High: 22-29.9   Extreme: 30 and above           Subjective: She was alert and oriented with very mild cognitive impairment.  She reports abdominal pain only when she needs  to have a BM, does not look at them so she was unaware of tarry stools.  She has been feeling weak and SOB for the last few weeks.  She does not desire GI evaluation or additional work up but is ok with receiving blood and starting PPI.  She is upset because she thinks she should not have been taking Xarelto.   I spoke with her son by telephone.  He and his sister are considering a transition to comfort care.  They are open to palliative care.  She is happy in her ALF, therapy hasn't seemed to have helped.   Objective: Vitals:   01/27/24 0058 01/27/24 0336  BP: (!) 137/54 131/62  Pulse: 75 77  Resp: 18 18  Temp: 97.9 F (36.6 C) 98.4 F (36.9 C)  SpO2: 94% 93%    Intake/Output Summary (Last 24 hours) at 01/27/2024 0754 Last data filed at 01/27/2024 0336 Gross per 24 hour  Intake 607 ml  Output 250 ml  Net 357 ml   Filed Weights   01/26/24 1800  Weight: 62.5 kg    Exam:  General:  Appears calm and comfortable and is in NAD Eyes:  EOMI, normal lids, iris ENT:  grossly normal hearing, lips & tongue, mmm Cardiovascular:  RRR, no m/r/g. No LE edema.  Respiratory:   CTA bilaterally with no wheezes/rales/rhonchi.  Normal respiratory effort. Abdomen:  soft, NT, ND Skin:  no rash or induration seen on limited exam Musculoskeletal:  grossly normal tone BUE/BLE, good ROM, no bony abnormality Psychiatric:  grossly normal mood and affect, speech fluent and appropriate with scant MCI Neurologic:  CN 2-12 grossly intact, moves all extremities in coordinated fashion  Data Reviewed: I have reviewed the patient's lab results since admission.  Pertinent labs for today include:   K+ 3.0 BUN 21/Creatinine 1.03/GFR 51, improved Lactate 2.4, 2.3 HS troponin 12, 11 WBC 5.9 Hgb 6.8, improved from 6.1 after 1 unit PRBC    Family Communication: None present; I spoke with her son by telephone  Disposition: Status is: Inpatient Remains inpatient appropriate because: ongoing  management     Time spent: 50 minutes  Unresulted Labs (From admission, onward)     Start     Ordered   01/28/24 0500  CBC with Differential/Platelet  Tomorrow morning,   R        01/27/24 0754   01/28/24 0500  Basic metabolic panel with GFR  Tomorrow morning,   R        01/27/24 0754   01/27/24 0754  Prepare RBC (crossmatch)  (Blood Administration Adult)  Once,   R       Question Answer Comment  # of Units 1 unit   Transfusion Indications Hemoglobin < 7 gm/dL and symptomatic   Number of Units to Keep Ahead NO units ahead   If emergent release call blood bank Not emergent release      01/27/24 0754             Author: Jonah Blue, MD 01/27/2024 7:54 AM  For on call review [email or web address].

## 2024-01-27 NOTE — IPAL (Signed)
  Interdisciplinary Goals of Care Family Meeting   Date carried out: 01/27/2024  Location of the meeting: Bedside  Member's involved: Physician, Family Member or next of kin, and Other: Patient  Durable Power of Attorney or Environmental health practitioner: None    Discussion: We discussed goals of care for [employer] .  Patient is clear that she doesn't want further interventions and prefers comfort care moving forward but is reluctant to definitively say she is ready for home with hospice.  With further discussion with her son (and with support from his sisters), we are in agreement that she wants to proceed with comfort care.  She is likely to discharge with home hospice tomorrow.  For now, will stop blood draws and plan for no further transfusion or Xarelto.  Will transition to comfort care order set.    Code status:   Code Status: Limited: Do not attempt resuscitation (DNR) -DNR-LIMITED -Do Not Intubate/DNI    Disposition: Home with Hospice  Time spent for the meeting: 35 minutes    Jonah Blue, MD  01/27/2024, 4:04 PM

## 2024-01-28 DIAGNOSIS — Z66 Do not resuscitate: Secondary | ICD-10-CM | POA: Diagnosis not present

## 2024-01-28 DIAGNOSIS — R296 Repeated falls: Secondary | ICD-10-CM

## 2024-01-28 DIAGNOSIS — I480 Paroxysmal atrial fibrillation: Secondary | ICD-10-CM | POA: Diagnosis not present

## 2024-01-28 DIAGNOSIS — Z7189 Other specified counseling: Secondary | ICD-10-CM

## 2024-01-28 DIAGNOSIS — R5381 Other malaise: Secondary | ICD-10-CM

## 2024-01-28 DIAGNOSIS — D649 Anemia, unspecified: Secondary | ICD-10-CM | POA: Diagnosis not present

## 2024-01-28 DIAGNOSIS — Z515 Encounter for palliative care: Secondary | ICD-10-CM | POA: Diagnosis not present

## 2024-01-28 LAB — TYPE AND SCREEN
ABO/RH(D): O POS
Antibody Screen: NEGATIVE
Unit division: 0
Unit division: 0

## 2024-01-28 LAB — BPAM RBC
Blood Product Expiration Date: 202504272359
Blood Product Expiration Date: 202504272359
ISSUE DATE / TIME: 202504010922
ISSUE DATE / TIME: 202503311527
Unit Type and Rh: 5100
Unit Type and Rh: 5100

## 2024-01-28 MED ORDER — POTASSIUM CHLORIDE 20 MEQ PO PACK
40.0000 meq | PACK | ORAL | Status: AC
  Administered 2024-01-28 (×2): 40 meq via ORAL
  Filled 2024-01-28 (×2): qty 2

## 2024-01-28 MED ORDER — PANTOPRAZOLE SODIUM 40 MG PO TBEC: 40.0000 mg | DELAYED_RELEASE_TABLET | Freq: Two times a day (BID) | ORAL | 0 refills | Status: AC

## 2024-01-28 NOTE — Progress Notes (Signed)
 [facility] present for pt discharge to [facility] with [facility]; pt discharged via stretcher

## 2024-01-28 NOTE — TOC Progression Note (Signed)
 Transition of Care [facility]) - Progression Note    Patient Details  Name: Yvonne [name] MRN: [id number] Date of Birth: [birth year]  [facility]) CM/SW Contact  Cherre Blanc, RN Phone Number: 01/28/2024, 12:40 PM  Clinical Narrative:     Patient is medically clear for dc to [facility] with [facility]. [facility] accepted the [facility] consult. TOC sent secure email to [facility] ([email or web address])  with FL2 and DC summary. TOC spoke with the patient's son, Nedra Hai [phone], and he is agreeable with the DC plan. [facility] will transport the patient. No other TOC needs identified.   Expected Discharge Plan: Home w Hospice Care Barriers to Discharge: No Barriers Identified  Expected Discharge Plan and Services   Discharge Planning Services: CM Consult   Living arrangements for the past 2 months: Assisted Living Facility Expected Discharge Date: 01/28/24                                     Social Determinants of Health (SDOH) Interventions SDOH Screenings   Food Insecurity: No Food Insecurity (01/26/2024)  Housing: Low Risk  (01/26/2024)  Transportation Needs: No Transportation Needs (01/26/2024)  Utilities: Not At Risk (01/26/2024)  Social Connections: Socially Isolated (01/26/2024)  Tobacco Use: Low Risk  (01/26/2024)    Readmission Risk Interventions     No data to display

## 2024-01-28 NOTE — Progress Notes (Addendum)
 [facility] Liaison note  Referral received today from [place], Kirk Ruths, RN, to set up Hospice services at [facility].  Reviewed Hospice services with patient's son.  He is agreeable to having hospice follow up.  Referral submitted today.  AV nurse to assess for DME needs.  Hospital team aware.  Please call with any Hospice related questions or concerns.    Thank you for the opportunity to participate in this patient's care.  [facility] liaison [phone]

## 2024-01-28 NOTE — Plan of Care (Signed)
  Problem: Education: Goal: Knowledge of General Education information will improve Description: Including pain rating scale, medication(s)/side effects and non-pharmacologic comfort measures Outcome: Adequate for Discharge   Problem: Health Behavior/Discharge Planning: Goal: Ability to manage health-related needs will improve Outcome: Adequate for Discharge   Problem: Clinical Measurements: Goal: Ability to maintain clinical measurements within normal limits will improve Outcome: Adequate for Discharge Goal: Will remain free from infection Outcome: Adequate for Discharge Goal: Diagnostic test results will improve Outcome: Adequate for Discharge Goal: Respiratory complications will improve Outcome: Adequate for Discharge Goal: Cardiovascular complication will be avoided Outcome: Adequate for Discharge   Problem: Activity: Goal: Risk for activity intolerance will decrease Outcome: Adequate for Discharge   Problem: Nutrition: Goal: Adequate nutrition will be maintained Outcome: Adequate for Discharge   Problem: Coping: Goal: Level of anxiety will decrease Outcome: Adequate for Discharge   Problem: Elimination: Goal: Will not experience complications related to bowel motility Outcome: Adequate for Discharge Goal: Will not experience complications related to urinary retention Outcome: Adequate for Discharge   Problem: Pain Managment: Goal: General experience of comfort will improve and/or be controlled Outcome: Adequate for Discharge   Problem: Safety: Goal: Ability to remain free from injury will improve Outcome: Adequate for Discharge   Problem: Skin Integrity: Goal: Risk for impaired skin integrity will decrease Outcome: Adequate for Discharge   Problem: Education: Goal: Knowledge of the prescribed therapeutic regimen will improve Outcome: Adequate for Discharge   Problem: Coping: Goal: Ability to identify and develop effective coping behavior will  improve Outcome: Adequate for Discharge   Problem: Clinical Measurements: Goal: Quality of life will improve Outcome: Adequate for Discharge   Problem: Respiratory: Goal: Verbalizations of increased ease of respirations will increase Outcome: Adequate for Discharge   Problem: Role Relationship: Goal: Family's ability to cope with current situation will improve Outcome: Adequate for Discharge Goal: Ability to verbalize concerns, feelings, and thoughts to partner or family member will improve Outcome: Adequate for Discharge   Problem: Pain Management: Goal: Satisfaction with pain management regimen will improve Outcome: Adequate for Discharge

## 2024-01-28 NOTE — NC FL2 (Signed)
 [place] MEDICAID FL2 LEVEL OF CARE FORM     IDENTIFICATION  Patient Name: Yvonne [name] Birthdate: [birth year] Sex: female Admission Date (Current Location): 01/26/2024  [facility] and [place] Number:  Chiropodist and Address:  [facility], [address] [place], [place] [address]      Provider Number: [id number]  Attending Physician Name and Address:  Marrion Coy, MD  Relative Name and Phone Number:  Jaqlyn Gruenhagen [phone]    Current Level of Care: Hospital Recommended Level of Care: Assisted Living Facility Prior Approval Number:    Date Approved/Denied:   PASRR Number:    Discharge Plan: Other (Comment) ([facility] w/ [facility])    Current Diagnoses: Patient Active Problem List   Diagnosis Date Noted   Acute on chronic anemia 01/26/2024   Closed displaced fracture of right femoral neck (HCC) 09/25/2023   Fall at home, initial encounter 09/25/2023   Chronic anticoagulation 09/25/2023   Heart failure with reduced ejection fraction (HCC) 09/25/2023   Essential hypertension 09/25/2023   Normocytic anemia 09/25/2023   Anxiety 09/25/2023   Urinary urgency 09/25/2023   Pacemaker -  05/10/2022   Unilateral primary osteoarthritis, left knee 05/10/2020   PAF (paroxysmal atrial fibrillation) (HCC) 09/27/2017   Complete heart block (HCC) 09/26/2017   Hypokalemia 04/29/2016   NICM (nonischemic cardiomyopathy) (HCC) 04/29/2016   LBBB (left bundle branch block) 04/29/2016   Paroxysmal atrial fibrillation (HCC) 04/25/2016   Acute systolic congestive heart failure, NYHA class 3 (HCC) 04/25/2016   Bilateral pleural effusion 04/25/2016   Acute respiratory failure with hypoxia (HCC) 04/25/2016   Positive D dimer 04/25/2016   Protein calorie malnutrition (HCC) 04/25/2016   Stage 1 decubitus ulcer/LOW THORACIC spine 04/25/2016   Physical deconditioning 04/25/2016   Fracture, humerus, proximal 04/10/2015   DYSURIA 10/08/2010   MITRAL  REGURGITATION 04/13/2009   LEFT BUNDLE BRANCH BLOCK 04/13/2009   GERD 04/13/2009   Palpitations 04/13/2009   PEPTIC ULCER DISEASE, HX OF 04/13/2009   SVT/ PSVT/ PAT 03/26/2009    Orientation RESPIRATION BLADDER Height & Weight     Self, Time, Situation, Place    Continent Weight: 62.5 kg Height:  5\' 6"  (167.6 cm)  BEHAVIORAL SYMPTOMS/MOOD NEUROLOGICAL BOWEL NUTRITION STATUS      Continent Diet (Regular)  AMBULATORY STATUS COMMUNICATION OF NEEDS Skin     Verbally                         Personal Care Assistance Level of Assistance              Functional Limitations Info             SPECIAL CARE FACTORS FREQUENCY                       Contractures      Additional Factors Info  Code Status, Allergies Code Status Info: DNR Allergies Info: Adhesive Tape, Aspirin, Naproxen, Ambien, Flexeril           Current Medications (01/28/2024):  This is the current hospital active medication list Current Facility-Administered Medications  Medication Dose Route Frequency Provider Last Rate Last Admin   acetaminophen (TYLENOL) tablet 650 mg  650 mg Oral Q6H PRN Cox, Amy N, DO   650 mg at 01/28/24 7846   Or   acetaminophen (TYLENOL) suppository 650 mg  650 mg Rectal Q6H PRN Cox, Amy N, DO       ALPRAZolam Prudy Feeler) tablet  0.25 mg  0.25 mg Oral BID PRN Cox, Amy N, DO   0.25 mg at 01/27/24 2246   antiseptic oral rinse (BIOTENE) solution 15 mL  15 mL Topical PRN Jonah Blue, MD       diphenhydrAMINE (BENADRYL) injection 12.5 mg  12.5 mg Intravenous Q4H PRN Jonah Blue, MD       fluticasone (FLONASE) 50 MCG/ACT nasal spray 2 spray  2 spray Each Nare QHS PRN Cox, Amy N, DO       glycopyrrolate (ROBINUL) tablet 1 mg  1 mg Oral Q4H PRN Jonah Blue, MD       Or   glycopyrrolate (ROBINUL) injection 0.2 mg  0.2 mg Subcutaneous Q4H PRN Jonah Blue, MD       Or   glycopyrrolate (ROBINUL) injection 0.2 mg  0.2 mg Intravenous Q4H PRN Jonah Blue, MD        haloperidol (HALDOL) tablet 0.5 mg  0.5 mg Oral Q4H PRN Jonah Blue, MD       Or   haloperidol (HALDOL) 2 MG/ML solution 0.5 mg  0.5 mg Sublingual Q4H PRN Jonah Blue, MD       Or   haloperidol lactate (HALDOL) injection 0.5 mg  0.5 mg Intravenous Q4H PRN Jonah Blue, MD       LORazepam (ATIVAN) 2 MG/ML concentrated solution 1 mg  1 mg Sublingual Q4H PRN Jonah Blue, MD       Or   LORazepam (ATIVAN) injection 1 mg  1 mg Intravenous Q4H PRN Jonah Blue, MD       mirtazapine (REMERON) tablet 7.5 mg  7.5 mg Oral QHS Cox, Amy N, DO   7.5 mg at 01/27/24 2011   morphine (PF) 2 MG/ML injection 2 mg  2 mg Intravenous Q2H PRN Jonah Blue, MD       ondansetron [facility]) tablet 4 mg  4 mg Oral Q6H PRN Cox, Amy N, DO   4 mg at 01/28/24 8657   Or   ondansetron (ZOFRAN) injection 4 mg  4 mg Intravenous Q6H PRN Cox, Amy N, DO       oxyCODONE (Oxy IR/ROXICODONE) immediate release tablet 5 mg  5 mg Oral Q4H PRN Jonah Blue, MD       polyethylene glycol (MIRALAX / GLYCOLAX) packet 17 g  17 g Oral Daily PRN Cox, Amy N, DO       polyvinyl alcohol (LIQUIFILM TEARS) 1.4 % ophthalmic solution 1 drop  1 drop Both Eyes QID PRN Jonah Blue, MD       potassium chloride (KLOR-CON) packet 40 mEq  40 mEq Oral Q2H Marrion Coy, MD       senna-docusate (Senokot-S) tablet 1 tablet  1 tablet Oral QHS PRN Cox, Amy N, DO       sodium chloride (OCEAN) 0.65 % nasal spray 2 spray  2 spray Each Nare Daily PRN Cox, Amy N, DO         Discharge Medications: Please see discharge summary for a list of discharge medications.  Relevant Imaging Results:  Relevant Lab Results:   Additional Information [id number]  Cherre Blanc, RN

## 2024-01-28 NOTE — TOC Transition Note (Signed)
 Transition of Care [facility]) - Discharge Note   Patient Details  Name: Yvonne [name] MRN: [id number] Date of Birth: 02-24-1929  Transition of Care [facility]) CM/SW Contact:  Cherre Blanc, RN Phone Number: 01/28/2024, 3:28 PM   Clinical Narrative:     Patient discharged backed to [facility] with [facility]. Transportation via [employer]. No other TOC needs identified.  Final next level of care: Home w Hospice Care (Patient will go to [facility] w/ [facility]) Barriers to Discharge: No Barriers Identified   Patient Goals and CMS Choice            Discharge Placement                       Discharge Plan and Services Additional resources added to the After Visit Summary for     Discharge Planning Services: CM Consult                                 Social Drivers of Health (SDOH) Interventions SDOH Screenings   Food Insecurity: No Food Insecurity (01/26/2024)  Housing: Low Risk  (01/26/2024)  Transportation Needs: No Transportation Needs (01/26/2024)  Utilities: Not At Risk (01/26/2024)  Social Connections: Socially Isolated (01/26/2024)  Tobacco Use: Low Risk  (01/26/2024)     Readmission Risk Interventions     No data to display

## 2024-01-28 NOTE — Discharge Summary (Addendum)
 Physician Discharge Summary   Patient: Yvonne [name] MRN: [id number] DOB: [birth year]  Admit date:     01/26/2024  Discharge date: 01/28/24  Discharge Physician: Marrion Coy   PCP: Almetta [facility]   Recommendations at discharge:   Follow-up with hospice as outpatient.  Discharge Diagnoses: Principal Problem:   Acute on chronic anemia Active Problems:   Fall at home, initial encounter   Chronic anticoagulation   Complete heart block (HCC)   Heart failure with reduced ejection fraction (HCC)   Essential hypertension   Anxiety   Normocytic anemia   GERD   PEPTIC ULCER DISEASE, HX OF   Protein calorie malnutrition (HCC)   Physical deconditioning   LBBB (left bundle branch block)   PAF (paroxysmal atrial fibrillation) (HCC) Chronic kidney disease stage IIIa. Hypokalemia repleted. Resolved Problems:   * No resolved hospital problems. [facility] Course: 89yo with h/o afib on Xarelto, chronic combined congestive heart failure, hypertension, pacemaker placement, and history of GI bleed presumed secondary to NSAID use who presented from  [facility] facility on 3/31 for multiple falls over the past 3 days.  She was noted to have black stool; Xarelto held, Hgb 6.1 and transfused 1 unit PRBC. Hemoglobin had increased to 9.4.  Family has decided no additional workup or aggressive treatment.  Transitioned to comfort care.  Patient be followed by hospice as outpatient.  She is going home with hospice.  Assessment and Plan: Upper GI Bleed Patient is presenting with melena and anemia, suggestive of upper GI bleeding. Patient has history of remote UGI bleed that was thought to be related to NSAIDs Most likely diagnosis is gastric or duodenal ulcer, esophagitis or gastritis, or Mallory-Weiss tear. Admitted to telemetry bed -> med surg Patient does not desire GI evaluation, scope Start IV pantoprazole 40 BIDZofran IV for nausea Avoid NSAIDs and SQ heparin Condition  appears to be stabilizing after transfusion.  I will continue Protonix oral twice a day.  Patient has transitioned to comfort care.  Hypokalemia. Potassium 3.0 yesterday, received 40 mEq of oral potassium yesterday, will give additional 80 mEq of potassium before discharge.   ABLA Patient's SOB and fatigue are most likely caused by anemia secondary to upper GI bleeding Her Hgb decreased from 10 on 10/09/2023 to 6.1 on presentation Received 2 units of PRBC transfusion, hemoglobin has been stable.  No additional bleeding.  Discontinued anticoagulation.   Chronic anticoagulation Xarelto will be discontinued.   Anxiety Home alprazolam 0.25 mg p.o. twice daily as needed for anxiety resumed Mirtazapine 7.5 mg nightly resumed   Essential hypertension Home treatment.   Chronic Heart failure with reduced ejection fraction  Patient appears euvolemic and not in acute exacerbation at this time Last echo was in 2017 with EF 40-45% and grade 1 DD Resume home treatment.   Complete heart block Status post pacemaker placement   PAF (paroxysmal atrial fibrillation)  Carvedilol 12.5 mg p.o. twice daily resumed    Physical deconditioning Comfort care.   Nutrition Status: Moderate protein calorie malnutrition. Nutrition Problem: Inadequate oral intake Etiology: poor appetite Signs/Symptoms: per patient/family report No additional intervention due to comfort care.   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 ICA aneurysm   Acute SAH Recurrent falls PTA Trace acute SAH       Consultants: None Procedures performed: None  Disposition: Hospice care Diet recommendation:  Discharge Diet Orders (From admission, onward)     Start     Ordered   01/28/24 0000  Diet - low sodium heart healthy        01/28/24 1021           Cardiac diet DISCHARGE MEDICATION: Allergies as of 01/28/2024       Reactions   Aleve [naproxen Sodium] Other (See Comments)   Gi bleed    Ambien [zolpidem] Other (See Comments)   Pt states "made me have crazy thoughts"   Flexeril [cyclobenzaprine] Other (See Comments)   Urinary rentention   Adhesive [tape] Rash, Other (See Comments)   Skin redness   Aspirin Other (See Comments)   Told to avoid high doses due to history of stomach ulcer        Medication List     STOP taking these medications    ALPRAZolam 0.25 MG tablet Commonly known as: XANAX   HYDROcodone-acetaminophen 5-325 MG tablet Commonly known as: NORCO/VICODIN   potassium chloride 10 MEQ tablet Commonly known as: KLOR-CON   Xarelto 15 MG Tabs tablet Generic drug: Rivaroxaban       TAKE these medications    acetaminophen 500 MG tablet Commonly known as: TYLENOL Take 2 tablets (1,000 mg total) by mouth 3 (three) times daily.   amiodarone 100 MG tablet Commonly known as: PACERONE Take 100 mg by mouth daily. What changed: Another medication with the same name was removed. Continue taking this medication, and follow the directions you see here.   buPROPion 300 MG 24 hr tablet Commonly known as: WELLBUTRIN XL Take 1 tablet by mouth daily.   carvedilol 12.5 MG tablet Commonly known as: COREG Take 1 tablet (12.5 mg total) by mouth 2 (two) times daily.   fluticasone 50 MCG/ACT nasal spray Commonly known as: FLONASE Place 2 sprays into both nostrils daily. 2 sprays each nostril at night   furosemide 20 MG tablet Commonly known as: LASIX TAKE TWO TABLETS BY MOUTH DAILY   Melatonin 10 MG Subl Place 10 mg under the tongue at bedtime.   mirtazapine 7.5 MG tablet Commonly known as: REMERON Take 7.5 mg by mouth at bedtime.   ondansetron 8 MG tablet Commonly known as: ZOFRAN Take 8 mg by mouth every 8 (eight) hours as needed for nausea or vomiting.   pantoprazole 40 MG tablet Commonly known as: PROTONIX Take 1 tablet (40 mg total) by mouth 2 (two) times daily. What changed: when to take this   polyethylene glycol 17 g packet Commonly  known as: MIRALAX / GLYCOLAX Take 17 g by mouth daily as needed for mild constipation.   psyllium 0.52 g capsule Commonly known as: REGULOID Take 0.52 g by mouth daily.   sodium chloride 0.65 % Soln nasal spray Commonly known as: OCEAN Place 2 sprays into both nostrils daily as needed for congestion.   SYSTANE COMPLETE OP Apply 1 drop to eye as needed.   traMADol 50 MG tablet Commonly known as: ULTRAM Take 25 mg by mouth in the morning.        Follow-up Information     [facility] Follow up.   Specialty: Geriatric Medicine Why: Hospital follow up Contact information: [address] [place] [address] [phone]                Discharge Exam: Ceasar Mons Weights   01/26/24 1800  Weight: 62.5 kg   General exam: Appears calm and comfortable  Respiratory system: Clear to auscultation. Respiratory effort normal. Cardiovascular system: S1 & S2 heard, RRR. No JVD, murmurs, rubs, gallops or clicks. No pedal edema. Gastrointestinal  system: Abdomen is nondistended, soft and nontender. No organomegaly or masses felt. Normal bowel sounds heard. Central nervous system: Alert and oriented. No focal neurological deficits. Extremities: Symmetric 5 x 5 power. Skin: No rashes, lesions or ulcers Psychiatry: Judgement and insight appear normal. Mood & affect appropriate.    Condition at discharge: fair  The results of significant diagnostics from this hospitalization (including imaging, microbiology, ancillary and laboratory) are listed below for reference.   Imaging Studies: DG Chest Port 1 View Result Date: 01/26/2024 CLINICAL DATA:  Falls, on anticoagulation therapy. EXAM: PORTABLE CHEST 1 VIEW COMPARISON:  09/17/2023 and CT chest 04/25/2016. FINDINGS: Pacemaker lead tips are in the right atrium and right ventricle. Trachea is midline. Heart is enlarged, stable. Thoracic aorta is calcified. Left apical pleural thickening. No airspace  consolidation or pleural fluid. Old left rib fracture. IMPRESSION: No acute findings. Electronically Signed   By: Leanna Battles M.D.   On: 01/26/2024 16:53   CT ANGIO HEAD NECK W WO CM Result Date: 01/26/2024 CLINICAL DATA:  Subarachnoid hemorrhage on same day head CT, possibly posttraumatic., fell and hit back of head, on anticoagulation. EXAM: CT ANGIOGRAPHY HEAD AND NECK WITH AND WITHOUT CONTRAST TECHNIQUE: Multidetector CT imaging of the head and neck was performed using the standard protocol during bolus administration of intravenous contrast. Multiplanar CT image reconstructions and MIPs were obtained to evaluate the vascular anatomy. Carotid stenosis measurements (when applicable) are obtained utilizing NASCET criteria, using the distal internal carotid diameter as the denominator. RADIATION DOSE REDUCTION: This exam was performed according to the departmental dose-optimization program which includes automated exposure control, adjustment of the mA and/or kV according to patient size and/or use of iterative reconstruction technique. CONTRAST:  50mL OMNIPAQUE IOHEXOL 350 MG/ML SOLN COMPARISON:  Same-day head CT. FINDINGS: CTA NECK FINDINGS Aortic arch: Standard configuration of the aortic arch. Imaged portion shows no evidence of aneurysm or dissection. Mild atherosclerosis. No significant stenosis of the major arch vessel origins. Pulmonary arteries: As permitted by contrast timing, there are no filling defects in the visualized pulmonary arteries. Subclavian arteries: Patent bilaterally. Atherosclerosis at the proximal aspect of both subclavian arteries without significant stenosis. Right carotid system: Patent from the origin to the skull base. Bulky calcified atherosclerosis at the carotid bifurcation resulting in approximately 25% stenosis. Atherosclerosis involves the origin of the external carotid artery. No evidence of dissection. Left carotid system: Patent from the origin to the skull base.  Atherosclerosis at the carotid bifurcation without hemodynamically significant stenosis. Additional focal calcified atherosclerosis along the proximal cervical ICA without hemodynamically significant stenosis. Vertebral arteries: Code dominant. There is limited evaluation of the V1 and V2 segments of the vertebral arteries due to adjacent dense venous contrast. The visualized portions of the vertebral arteries are patent to the intracranial segments. The left V4 segment is well visualized to the vertebrobasilar confluence. Atretic post PICA segment of the right vertebral artery. Skeleton: No acute or aggressive lesion. Degenerative changes throughout the cervical spine. Anterolisthesis of C3 on C4 significant disc space narrowing at C4-5 and C5-6. Other neck: The visualized airway is patent. No cervical lymphadenopathy. Upper chest: Visualized lung apices without acute abnormality. Patulous appearance of the upper thoracic esophagus. Review of the MIP images confirms the above findings CTA HEAD FINDINGS ANTERIOR CIRCULATION: Evaluation is significantly limited due to artifact particularly at the skull base. The intracranial ICAs are patent from the skull base to the ICA termini. There is atherosclerosis of the bilateral carotid siphons more pronounced on the right. There is  likely mild stenosis of the right cavernous ICA. Possible outpouching along the right supraclinoid ICA at the origin of the posterior communicating artery which measures up to 4 mm in size. MCAs: Limited evaluation due to motion artifact. The M1 segments are patent bilaterally. There is significantly limited evaluation of M2 segments on the left which appear patent but are not well traced. Similar limited evaluation of right MCA branches. ACAs: The anterior cerebral arteries are patent at their origins and the A2 segments appear patent as well. Significantly limited evaluation of distal ACA branches due to motion. POSTERIOR CIRCULATION:  Limited  evaluation due to motion artifact. PCAs: The PCAs are patent proximally with limited evaluation of distal branches. Pcomm: The posterior communicating arteries are visualized bilaterally. SCAs: Not well visualized due to motion artifact. Basilar artery: Patent Vertebral arteries: The intracranial vertebral arteries are patent. Venous sinuses: Within the limitations of contrast timing and artifact no acute abnormality appreciated. Anatomic variants: None Review of the MIP images confirms the above findings IMPRESSION: Examination is significantly limited due to motion artifact. Within these limitations there is likely a 3-4 mm inferiorly directed outpouching along the right supraclinoid ICA at the origin of the posterior communicating artery concerning for aneurysm. Significantly limited evaluation of the cerebral arteries due to motion. There is no definite proximal occlusion appreciated. Multifocal atherosclerosis as above. Approximately 25% stenosis at the origin of the right cervical ICA. Anterolisthesis of C3 on C4 which may be related to chronic facet degenerative changes. Recommend correlation with cervical spine tenderness and consider MRI for further evaluation if clinically indicated. Electronically Signed   By: Emily Filbert M.D.   On: 01/26/2024 16:13   CT HEAD WO CONTRAST Addendum Date: 01/26/2024 ADDENDUM REPORT: 01/26/2024 13:42 ADDENDUM: Study discussed by telephone with Dr. Dionne Bucy on 01/26/2024 at 1337 hours. And correction, the final sentence of Impression #2 should read: "No other acute intracranial abnormality." Electronically Signed   By: Odessa Fleming M.D.   On: 01/26/2024 13:42   Result Date: 01/26/2024 CLINICAL DATA:  89 year old female status post fall striking back of the head. On blood thinners. EXAM: CT HEAD WITHOUT CONTRAST TECHNIQUE: Contiguous axial images were obtained from the base of the skull through the vertex without intravenous contrast. RADIATION DOSE REDUCTION: This  exam was performed according to the departmental dose-optimization program which includes automated exposure control, adjustment of the mA and/or kV according to patient size and/or use of iterative reconstruction technique. COMPARISON:  Head CT 09/01/2022. FINDINGS: Brain: Stable cerebral volume since 2023, normal for age. No midline shift, mass effect, or evidence of intracranial mass lesion. Small volume subarachnoid hemorrhage in the right middle cranial fossa near the right ICA terminus series 3, image 10). No IVH or other intracranial hemorrhage is identified. No ventriculomegaly. Other basilar cisterns appear normal. Stable gray-white matter differentiation throughout the brain. No cortically based acute infarct identified. No convincing cortical contusion. Vascular: Calcified atherosclerosis at the skull base. No suspicious intracranial vascular hyperdensity. Skull: No fracture identified. Sinuses/Orbits: Chronic right posterior ethmoid and sphenoid sinusitis with mucoperiosteal thickening. Stable sinus, mastoid aeration. Other: No acute orbit or scalp soft tissue injury identified. Chronic prominence of the superior ophthalmic veins does not appear significantly changed from a 2018 comparison. Traumatic Brain Injury Risk Stratification Skull Fracture: No - Low/mBIG 1 Subdural Hematoma (SDH): No - Low Subarachnoid Hemorrhage [facility]): trace (up to 2 sulci) - Low/mBIG 1 Epidural Hematoma (EDH): No - Low/mBIG 1 Cerebral contusion, intra-axial, intraparenchymal Hemorrhage (IPH): No Intraventricular Hemorrhage (IVH): No -  Low/mBIG 1 Midline Shift > 1mm or Edema/effacement of sulci/vents: No - Low/mBIG 1 ---------------------------------------------------- IMPRESSION: 1. Positive for Trace Acute Subarachnoid Hemorrhage in the middle cranial fossa near the right ICA terminus. Legrand Rams this is posttraumatic in nature in this setting. Follow-up CTA head could be utilized to exclude the presence of intracranial aneurysm.  2. No skull fracture or other acute traumatic injury identified. No other No acute intracranial abnormality. Electronically Signed: By: Odessa Fleming M.D. On: 01/26/2024 13:20   CT Cervical Spine Wo Contrast Result Date: 01/26/2024 CLINICAL DATA:  89 year old female status post fall striking back of the head. On blood thinners. EXAM: CT CERVICAL SPINE WITHOUT CONTRAST TECHNIQUE: Multidetector CT imaging of the cervical spine was performed without intravenous contrast. Multiplanar CT image reconstructions were also generated. RADIATION DOSE REDUCTION: This exam was performed according to the departmental dose-optimization program which includes automated exposure control, adjustment of the mA and/or kV according to patient size and/or use of iterative reconstruction technique. COMPARISON:  Head CT today.  Cervical spine CT 12/30/2017. FINDINGS: Alignment: Chronic but progressed degenerative anterolisthesis of C3 on C4, now up to 3 mm. Cervicothoracic junction alignment is within normal limits. Bilateral posterior element alignment is within normal limits. Skull base and vertebrae: Bone mineralization is within normal limits for age. Visualized skull base is intact. No atlanto-occipital dissociation. C1 and C2 appear intact and aligned. No acute osseous abnormality identified. Soft tissues and spinal canal: No prevertebral fluid or swelling. No visible canal hematoma. Negative visible noncontrast neck soft tissues aside from cervical carotid calcified atherosclerosis. Disc levels: Chronic cervical spine degeneration superimposed on chronic degenerative ankylosis of the left C2-C3 facets. Severe and progressed left side C3-C4 facet arthropathy in the setting of progressed spondylolisthesis there. Probable mild C3-C4 multifactorial spinal stenosis. Upper chest: Visible upper thoracic levels appear intact. Mild apical lung scarring. IMPRESSION: 1. No acute traumatic injury identified in the cervical spine. 2. Progressed and  severe C3-C4 level spinal degeneration since 2019, superimposed on chronic C2-C3 facet ankylosis. Electronically Signed   By: Odessa Fleming M.D.   On: 01/26/2024 13:24   CUP PACEART REMOTE DEVICE CHECK Result Date: 01/15/2024 Scheduled remote reviewed. Normal device function.  Presenting rhythm: AS/VP, artifact oversensing on atrial lead. 103 AMS episodes, longest 3 days 8 hrs 28 min on 10/10/23, available EGMs c/w A>V conduction, V rates controlled, Burden 6.9% since 09/10/23, on Xarelto per Epic. Next remote 91 days. MC, CVRS   Microbiology: Results for orders placed or performed during the hospital encounter of 10/09/23  Urine Culture     Status: Abnormal   Collection Time: 10/09/23  4:18 PM   Specimen: Urine, Clean Catch  Result Value Ref Range Status   Specimen Description   Final    URINE, CLEAN CATCH Performed at [facility], [address], [place], [place] [address]    Special Requests   Final    NONE Performed at [facility], [address], [place], [place] [address]    Culture (A)  Final    <10,000 COLONIES/mL INSIGNIFICANT GROWTH Performed at [facility], [address], [place], [place] [address]    Report Status 10/10/2023 FINAL  Final    Labs: CBC: Recent Labs  Lab 01/26/24 1157 01/26/24 1951 01/27/24 0537 01/27/24 1416  WBC 7.3  --  5.9  --   HGB 6.1* 6.9* 6.8* 9.2*  HCT 21.3* 22.2* 21.9* 30.6*  MCV 89.5  --  83.9  --   PLT 320  --  261  --    Basic Metabolic Panel: Recent Labs  Lab 01/26/24 1157 01/27/24 0537  NA 139 139  K 4.0 3.0*  CL 108 106  CO2 21* 25  GLUCOSE 103* 81  BUN 30* 21  CREATININE 1.41* 1.03*  CALCIUM 8.7* 8.3*   Liver Function Tests: No results for input(s): "AST", "ALT", "ALKPHOS", "BILITOT", "PROT", "ALBUMIN" in the last 168 hours. CBG: No results for input(s): "GLUCAP" in the last 168 hours.  Discharge time spent: greater than 30 minutes.  Signed: Marrion Coy, MD [facility] 01/28/2024

## 2024-01-28 NOTE — Consult Note (Signed)
 Consultation Note Date: 01/28/2024 at 1000  Patient Name: Yvonne [name]  DOB: Feb 14, 1929  MRN: [id number]  Age / Sex: 89 y.o., female  PCP: [facility] Referring Physician: Marrion Coy, MD  HPI/Patient Profile: 89 y.o. female  with past medical history of  afib on Xarelto, chronic combined congestive heart failure, hypertension and pacemaker placement. She was admitted on 01/26/2024 after presenting to the ED from [facility] c/o  fatigue, melena and anemia suggestive of GI bleed secondary to NSAID use.   Vitals in ED 98, respiration rate of 20, heart rate 74, blood pressure initially 89/42, improved to 113/50, SpO2 100% on room air.  ED workup revealed sodium is 139, potassium 4.0, chloride 108, bicarb 21, BUN of 30, serum creatinine 1.41, EGFR of 35, nonfasting blood glucose 103, WBC 7.3, hemoglobin 6.1, platelets of 320, troponin 12, repeat 11. Lactic 2.4, repeat 2.3. UA +trace leucocytes.    PMT consulted for GOC discussion.   Clinical Assessment and Goals of Care: Extensive chart review completed prior to meeting patient including labs, vital signs, imaging, progress notes, orders, and available advanced directive documents from current and previous encounters. I then met with patient to discuss diagnosis prognosis, GOC, EOL wishes, disposition and options.  I introduced Palliative Medicine as specialized medical care for people living with serious illness. It focuses on providing relief from the symptoms and stress of a serious illness. The goal is to improve quality of life for both the patient and the family.  Elderly, ill appearing female sleeping in bed. She awakens easily to verbal stimuli. She is A&O, calm and pleasant. She is in no distress.  She complains of generalized body pain that is relieved with Tylenol, nausea relieved with Zofran.   We discussed a brief life review of the  patient. 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 She is widowed, has one son, two daughters and multiple grandchildren.   As far as functional and nutritional status she reports prior to this admit, she ambulated with walker at [facility] facility but has several falls recently. She reports eating some meals, but usually not interested in eating breakfast. She adds that she does not eat in the dining room at her facility and prefers to eat alone in her room.   We discussed patient's current illness and what it means in the larger context of patient's on-going co-morbidities.  Natural disease trajectory and expectations at EOL were discussed.  I attempted to elicit values and goals of care important to the patient. Discussed home hospice and that RN and aid would come in a couple times a week to check on her and assist with her care. She responds, "that's too often" and would be more comfortable with palliative services only checking in once a month.   The difference between aggressive medical intervention and comfort care was considered in light of the patient's goals of care. [name] has decided that she does not want any medical interventions such as endoscopy or blood draws. Her  focus now is to be comfortable.   Family is facing treatment option decisions, advanced directive, and anticipatory care needs. [name] shares that in the event she is unable to make decisions for herself, she would want her son to be her decision maker.    Discussed with patient/family the importance of continued conversation with family and the medical providers regarding overall plan of care and treatment options, ensuring decisions are within the context of the patient's values and GOCs.    Per note, hospice liaison has visited patient, talked to son and coordinated transfer back to facility with hospice services, with tentative discharge today.    Questions and concerns were addressed.    Primary Decision Maker PATIENT  Physical Exam Vitals reviewed.  Constitutional:      General: She is not in acute distress.    Appearance: She is ill-appearing.  HENT:     Mouth/Throat:     Mouth: Mucous membranes are dry.  Pulmonary:     Effort: Pulmonary effort is normal. No respiratory distress.  Musculoskeletal:     Right lower leg: No edema.     Left lower leg: No edema.  Skin:    General: Skin is warm and dry.  Neurological:     Mental Status: She is alert and oriented to person, place, and time.  Psychiatric:        Mood and Affect: Mood normal.        Behavior: Behavior normal.     Palliative Assessment/Data: 50%     Thank you for this consult. Palliative medicine will continue to follow and assist holistically.   Time Total: 55 minutes  Time spent includes: Detailed review of medical records (labs, imaging, vital signs), medically appropriate exam (mental status, respiratory, cardiac, skin), discussed with treatment team, counseling and educating patient, family and staff, documenting clinical information, medication management and coordination of care.     Yvonne [name], Yvonne [name] [facility] Palliative Medicine Team  01/28/2024 11:43 AM  Office [phone]  Pager [phone]     Please contact Palliative Medicine Team providers via [facility] for questions and concerns.

## 2024-02-16 DIAGNOSIS — K5900 Constipation, unspecified: Secondary | ICD-10-CM | POA: Diagnosis not present

## 2024-02-16 DIAGNOSIS — F5101 Primary insomnia: Secondary | ICD-10-CM | POA: Diagnosis not present

## 2024-02-16 DIAGNOSIS — I480 Paroxysmal atrial fibrillation: Secondary | ICD-10-CM | POA: Diagnosis not present

## 2024-02-23 DIAGNOSIS — G8929 Other chronic pain: Secondary | ICD-10-CM | POA: Diagnosis not present

## 2024-02-23 DIAGNOSIS — M1990 Unspecified osteoarthritis, unspecified site: Secondary | ICD-10-CM | POA: Diagnosis not present

## 2024-02-23 DIAGNOSIS — M545 Low back pain, unspecified: Secondary | ICD-10-CM | POA: Diagnosis not present

## 2024-02-27 NOTE — Addendum Note (Signed)
 Addended by: Lott Rouleau A on: 02/27/2024 12:37 PM   Modules accepted: Orders

## 2024-02-27 NOTE — Progress Notes (Signed)
 Remote pacemaker transmission.

## 2024-03-17 DIAGNOSIS — I130 Hypertensive heart and chronic kidney disease with heart failure and stage 1 through stage 4 chronic kidney disease, or unspecified chronic kidney disease: Secondary | ICD-10-CM | POA: Diagnosis not present

## 2024-03-17 DIAGNOSIS — N1832 Chronic kidney disease, stage 3b: Secondary | ICD-10-CM | POA: Diagnosis not present

## 2024-03-17 DIAGNOSIS — K5901 Slow transit constipation: Secondary | ICD-10-CM | POA: Diagnosis not present

## 2024-03-17 DIAGNOSIS — K219 Gastro-esophageal reflux disease without esophagitis: Secondary | ICD-10-CM | POA: Diagnosis not present

## 2024-03-17 DIAGNOSIS — J309 Allergic rhinitis, unspecified: Secondary | ICD-10-CM | POA: Diagnosis not present

## 2024-03-17 DIAGNOSIS — E782 Mixed hyperlipidemia: Secondary | ICD-10-CM | POA: Diagnosis not present

## 2024-03-17 DIAGNOSIS — I509 Heart failure, unspecified: Secondary | ICD-10-CM | POA: Diagnosis not present

## 2024-03-17 DIAGNOSIS — I480 Paroxysmal atrial fibrillation: Secondary | ICD-10-CM | POA: Diagnosis not present

## 2024-03-17 DIAGNOSIS — F331 Major depressive disorder, recurrent, moderate: Secondary | ICD-10-CM | POA: Diagnosis not present

## 2024-03-17 DIAGNOSIS — M170 Bilateral primary osteoarthritis of knee: Secondary | ICD-10-CM | POA: Diagnosis not present

## 2024-03-17 DIAGNOSIS — E441 Mild protein-calorie malnutrition: Secondary | ICD-10-CM | POA: Diagnosis not present

## 2024-03-17 DIAGNOSIS — D508 Other iron deficiency anemias: Secondary | ICD-10-CM | POA: Diagnosis not present

## 2024-03-29 DIAGNOSIS — K5901 Slow transit constipation: Secondary | ICD-10-CM | POA: Diagnosis not present

## 2024-03-29 DIAGNOSIS — R296 Repeated falls: Secondary | ICD-10-CM | POA: Diagnosis not present

## 2024-03-29 DIAGNOSIS — F5101 Primary insomnia: Secondary | ICD-10-CM | POA: Diagnosis not present

## 2024-03-29 DIAGNOSIS — I480 Paroxysmal atrial fibrillation: Secondary | ICD-10-CM | POA: Diagnosis not present

## 2024-04-05 DIAGNOSIS — M1990 Unspecified osteoarthritis, unspecified site: Secondary | ICD-10-CM | POA: Diagnosis not present

## 2024-04-05 DIAGNOSIS — G8929 Other chronic pain: Secondary | ICD-10-CM | POA: Diagnosis not present

## 2024-04-05 DIAGNOSIS — M545 Low back pain, unspecified: Secondary | ICD-10-CM | POA: Diagnosis not present

## 2024-04-14 ENCOUNTER — Ambulatory Visit (INDEPENDENT_AMBULATORY_CARE_PROVIDER_SITE_OTHER)

## 2024-04-14 DIAGNOSIS — I442 Atrioventricular block, complete: Secondary | ICD-10-CM

## 2024-04-15 LAB — CUP PACEART REMOTE DEVICE CHECK
Battery Remaining Longevity: 42 mo
Battery Remaining Percentage: 35 %
Battery Voltage: 2.96 V
Brady Statistic AP VP Percent: 5.5 %
Brady Statistic AP VS Percent: 1 %
Brady Statistic AS VP Percent: 94 %
Brady Statistic AS VS Percent: 1 %
Brady Statistic RA Percent Paced: 5.1 %
Brady Statistic RV Percent Paced: 99 %
Date Time Interrogation Session: 20250618020014
Implantable Lead Connection Status: 753985
Implantable Lead Connection Status: 753985
Implantable Lead Implant Date: 20181130
Implantable Lead Implant Date: 20181130
Implantable Lead Location: 753859
Implantable Lead Location: 753860
Implantable Pulse Generator Implant Date: 20181130
Lead Channel Impedance Value: 300 Ohm
Lead Channel Impedance Value: 480 Ohm
Lead Channel Pacing Threshold Amplitude: 0.625 V
Lead Channel Pacing Threshold Amplitude: 0.75 V
Lead Channel Pacing Threshold Pulse Width: 0.5 ms
Lead Channel Pacing Threshold Pulse Width: 0.5 ms
Lead Channel Sensing Intrinsic Amplitude: 3.4 mV
Lead Channel Sensing Intrinsic Amplitude: 5.7 mV
Lead Channel Setting Pacing Amplitude: 0.875
Lead Channel Setting Pacing Amplitude: 2 V
Lead Channel Setting Pacing Pulse Width: 0.5 ms
Lead Channel Setting Sensing Sensitivity: 2 mV
Pulse Gen Model: 2272
Pulse Gen Serial Number: 8969488

## 2024-04-16 ENCOUNTER — Ambulatory Visit: Payer: Self-pay | Admitting: Cardiology

## 2024-05-26 DIAGNOSIS — H6123 Impacted cerumen, bilateral: Secondary | ICD-10-CM | POA: Diagnosis not present

## 2024-05-26 DIAGNOSIS — H9222 Otorrhagia, left ear: Secondary | ICD-10-CM | POA: Diagnosis not present

## 2024-06-23 NOTE — Addendum Note (Signed)
 Addended by: VICCI SELLER A on: 06/23/2024 03:28 PM   Modules accepted: Orders

## 2024-06-23 NOTE — Progress Notes (Signed)
 Remote pacemaker transmission.

## 2024-07-14 ENCOUNTER — Ambulatory Visit (INDEPENDENT_AMBULATORY_CARE_PROVIDER_SITE_OTHER)

## 2024-07-14 DIAGNOSIS — I442 Atrioventricular block, complete: Secondary | ICD-10-CM

## 2024-07-15 ENCOUNTER — Ambulatory Visit: Payer: Self-pay | Admitting: Cardiology

## 2024-07-15 LAB — CUP PACEART REMOTE DEVICE CHECK
Battery Remaining Longevity: 40 mo
Battery Remaining Percentage: 33 %
Battery Voltage: 2.96 V
Brady Statistic AP VP Percent: 7.8 %
Brady Statistic AP VS Percent: 1 %
Brady Statistic AS VP Percent: 92 %
Brady Statistic AS VS Percent: 1 %
Brady Statistic RA Percent Paced: 7.2 %
Brady Statistic RV Percent Paced: 99 %
Date Time Interrogation Session: 20250917020013
Implantable Lead Connection Status: 753985
Implantable Lead Connection Status: 753985
Implantable Lead Implant Date: 20181130
Implantable Lead Implant Date: 20181130
Implantable Lead Location: 753859
Implantable Lead Location: 753860
Implantable Pulse Generator Implant Date: 20181130
Lead Channel Impedance Value: 310 Ohm
Lead Channel Impedance Value: 510 Ohm
Lead Channel Pacing Threshold Amplitude: 0.375 V
Lead Channel Pacing Threshold Amplitude: 0.75 V
Lead Channel Pacing Threshold Pulse Width: 0.5 ms
Lead Channel Pacing Threshold Pulse Width: 0.5 ms
Lead Channel Sensing Intrinsic Amplitude: 3.3 mV
Lead Channel Sensing Intrinsic Amplitude: 7.4 mV
Lead Channel Setting Pacing Amplitude: 0.625
Lead Channel Setting Pacing Amplitude: 2 V
Lead Channel Setting Pacing Pulse Width: 0.5 ms
Lead Channel Setting Sensing Sensitivity: 2 mV
Pulse Gen Model: 2272
Pulse Gen Serial Number: 8969488

## 2024-07-19 NOTE — Progress Notes (Signed)
 Remote PPM Transmission

## 2024-08-02 DIAGNOSIS — M545 Low back pain, unspecified: Secondary | ICD-10-CM | POA: Diagnosis not present

## 2024-08-02 DIAGNOSIS — M1991 Primary osteoarthritis, unspecified site: Secondary | ICD-10-CM | POA: Diagnosis not present

## 2024-08-02 DIAGNOSIS — G8929 Other chronic pain: Secondary | ICD-10-CM | POA: Diagnosis not present

## 2024-10-13 ENCOUNTER — Ambulatory Visit (INDEPENDENT_AMBULATORY_CARE_PROVIDER_SITE_OTHER)

## 2024-10-13 DIAGNOSIS — I442 Atrioventricular block, complete: Secondary | ICD-10-CM

## 2024-10-14 ENCOUNTER — Ambulatory Visit: Payer: Self-pay | Admitting: Cardiology

## 2024-10-14 LAB — CUP PACEART REMOTE DEVICE CHECK
Battery Remaining Longevity: 37 mo
Battery Remaining Percentage: 30 %
Battery Voltage: 2.95 V
Brady Statistic AP VP Percent: 6.5 %
Brady Statistic AP VS Percent: 1 %
Brady Statistic AS VP Percent: 93 %
Brady Statistic AS VS Percent: 1 %
Brady Statistic RA Percent Paced: 6 %
Brady Statistic RV Percent Paced: 99 %
Date Time Interrogation Session: 20251217020029
Implantable Lead Connection Status: 753985
Implantable Lead Connection Status: 753985
Implantable Lead Implant Date: 20181130
Implantable Lead Implant Date: 20181130
Implantable Lead Location: 753859
Implantable Lead Location: 753860
Implantable Pulse Generator Implant Date: 20181130
Lead Channel Impedance Value: 350 Ohm
Lead Channel Impedance Value: 480 Ohm
Lead Channel Pacing Threshold Amplitude: 0.5 V
Lead Channel Pacing Threshold Amplitude: 0.75 V
Lead Channel Pacing Threshold Pulse Width: 0.5 ms
Lead Channel Pacing Threshold Pulse Width: 0.5 ms
Lead Channel Sensing Intrinsic Amplitude: 3.4 mV
Lead Channel Sensing Intrinsic Amplitude: 5.6 mV
Lead Channel Setting Pacing Amplitude: 0.75 V
Lead Channel Setting Pacing Amplitude: 2 V
Lead Channel Setting Pacing Pulse Width: 0.5 ms
Lead Channel Setting Sensing Sensitivity: 2 mV
Pulse Gen Model: 2272
Pulse Gen Serial Number: 8969488

## 2024-10-14 NOTE — Progress Notes (Signed)
 Remote PPM Transmission

## 2025-01-12 ENCOUNTER — Encounter

## 2025-04-13 ENCOUNTER — Encounter
